# Patient Record
Sex: Female | Born: 1970
Health system: Southern US, Community
[De-identification: ages and names within clinical notes are randomized; demographics above are authoritative.]

## PROBLEM LIST (undated history)

## (undated) DIAGNOSIS — M1990 Unspecified osteoarthritis, unspecified site: Secondary | ICD-10-CM

## (undated) DIAGNOSIS — A6000 Herpesviral infection of urogenital system, unspecified: Secondary | ICD-10-CM

## (undated) DIAGNOSIS — F419 Anxiety disorder, unspecified: Secondary | ICD-10-CM

## (undated) DIAGNOSIS — T7840XA Allergy, unspecified, initial encounter: Secondary | ICD-10-CM

## (undated) DIAGNOSIS — K219 Gastro-esophageal reflux disease without esophagitis: Secondary | ICD-10-CM

## (undated) DIAGNOSIS — R011 Cardiac murmur, unspecified: Secondary | ICD-10-CM

## (undated) DIAGNOSIS — Z9889 Other specified postprocedural states: Secondary | ICD-10-CM

## (undated) DIAGNOSIS — G459 Transient cerebral ischemic attack, unspecified: Secondary | ICD-10-CM

## (undated) DIAGNOSIS — I10 Essential (primary) hypertension: Secondary | ICD-10-CM

## (undated) DIAGNOSIS — H9190 Unspecified hearing loss, unspecified ear: Secondary | ICD-10-CM

## (undated) DIAGNOSIS — Z531 Procedure and treatment not carried out because of patient's decision for reasons of belief and group pressure: Secondary | ICD-10-CM

## (undated) HISTORY — DX: Unspecified osteoarthritis, unspecified site: M19.90

## (undated) HISTORY — PX: COSMETIC SURGERY: SHX468

## (undated) HISTORY — DX: Allergy, unspecified, initial encounter: T78.40XA

## (undated) HISTORY — DX: Gastro-esophageal reflux disease without esophagitis: K21.9

## (undated) HISTORY — DX: Essential (primary) hypertension: I10

## (undated) HISTORY — DX: Anxiety disorder, unspecified: F41.9

## (undated) HISTORY — PX: ROBOTIC ASSISTED LAP VAGINAL HYSTERECTOMY: SHX2362

## (undated) HISTORY — DX: Other specified postprocedural states: Z98.890

---

## 1998-09-13 ENCOUNTER — Ambulatory Visit (HOSPITAL_COMMUNITY): Admission: RE | Admit: 1998-09-13 | Discharge: 1998-09-13 | Payer: Self-pay | Admitting: Ophthalmology

## 1998-11-30 ENCOUNTER — Ambulatory Visit (HOSPITAL_COMMUNITY): Admission: RE | Admit: 1998-11-30 | Discharge: 1998-11-30 | Payer: Self-pay | Admitting: Ophthalmology

## 1999-10-03 ENCOUNTER — Encounter: Payer: Self-pay | Admitting: Emergency Medicine

## 1999-10-03 ENCOUNTER — Emergency Department (HOSPITAL_COMMUNITY): Admission: EM | Admit: 1999-10-03 | Discharge: 1999-10-03 | Payer: Self-pay | Admitting: Emergency Medicine

## 2002-05-03 ENCOUNTER — Other Ambulatory Visit: Admission: RE | Admit: 2002-05-03 | Discharge: 2002-05-03 | Payer: Self-pay | Admitting: Obstetrics and Gynecology

## 2002-09-29 ENCOUNTER — Ambulatory Visit (HOSPITAL_COMMUNITY): Admission: RE | Admit: 2002-09-29 | Discharge: 2002-09-29 | Payer: Self-pay | Admitting: Obstetrics and Gynecology

## 2002-09-29 ENCOUNTER — Encounter: Payer: Self-pay | Admitting: Obstetrics and Gynecology

## 2002-10-12 ENCOUNTER — Ambulatory Visit (HOSPITAL_COMMUNITY): Admission: AD | Admit: 2002-10-12 | Discharge: 2002-10-12 | Payer: Self-pay | Admitting: Obstetrics and Gynecology

## 2002-10-13 ENCOUNTER — Encounter (INDEPENDENT_AMBULATORY_CARE_PROVIDER_SITE_OTHER): Payer: Self-pay | Admitting: *Deleted

## 2002-10-13 ENCOUNTER — Encounter (INDEPENDENT_AMBULATORY_CARE_PROVIDER_SITE_OTHER): Payer: Self-pay

## 2002-10-18 ENCOUNTER — Inpatient Hospital Stay (HOSPITAL_COMMUNITY): Admission: AD | Admit: 2002-10-18 | Discharge: 2002-10-18 | Payer: Self-pay | Admitting: Obstetrics and Gynecology

## 2002-11-24 HISTORY — PX: MYOMECTOMY: SHX85

## 2002-12-21 ENCOUNTER — Inpatient Hospital Stay (HOSPITAL_COMMUNITY): Admission: AD | Admit: 2002-12-21 | Discharge: 2002-12-23 | Payer: Self-pay | Admitting: Obstetrics and Gynecology

## 2002-12-21 ENCOUNTER — Encounter (INDEPENDENT_AMBULATORY_CARE_PROVIDER_SITE_OTHER): Payer: Self-pay

## 2003-04-27 ENCOUNTER — Other Ambulatory Visit: Admission: RE | Admit: 2003-04-27 | Discharge: 2003-04-27 | Payer: Self-pay | Admitting: Obstetrics and Gynecology

## 2003-05-28 ENCOUNTER — Encounter: Payer: Self-pay | Admitting: Emergency Medicine

## 2003-05-28 ENCOUNTER — Emergency Department (HOSPITAL_COMMUNITY): Admission: EM | Admit: 2003-05-28 | Discharge: 2003-05-28 | Payer: Self-pay | Admitting: Emergency Medicine

## 2003-10-16 ENCOUNTER — Encounter: Admission: RE | Admit: 2003-10-16 | Discharge: 2003-10-16 | Payer: Self-pay | Admitting: Family Medicine

## 2003-11-01 ENCOUNTER — Encounter: Admission: RE | Admit: 2003-11-01 | Discharge: 2003-11-01 | Payer: Self-pay | Admitting: Obstetrics and Gynecology

## 2003-11-16 ENCOUNTER — Inpatient Hospital Stay (HOSPITAL_COMMUNITY): Admission: AD | Admit: 2003-11-16 | Discharge: 2003-11-16 | Payer: Self-pay | Admitting: Obstetrics and Gynecology

## 2004-07-31 ENCOUNTER — Encounter: Admission: RE | Admit: 2004-07-31 | Discharge: 2004-07-31 | Payer: Self-pay | Admitting: Obstetrics and Gynecology

## 2004-10-09 ENCOUNTER — Ambulatory Visit (HOSPITAL_COMMUNITY): Admission: RE | Admit: 2004-10-09 | Discharge: 2004-10-09 | Payer: Self-pay | Admitting: *Deleted

## 2004-10-09 ENCOUNTER — Ambulatory Visit (HOSPITAL_BASED_OUTPATIENT_CLINIC_OR_DEPARTMENT_OTHER): Admission: RE | Admit: 2004-10-09 | Discharge: 2004-10-09 | Payer: Self-pay | Admitting: *Deleted

## 2004-10-09 ENCOUNTER — Encounter (INDEPENDENT_AMBULATORY_CARE_PROVIDER_SITE_OTHER): Payer: Self-pay | Admitting: Specialist

## 2005-03-26 ENCOUNTER — Other Ambulatory Visit: Admission: RE | Admit: 2005-03-26 | Discharge: 2005-03-26 | Payer: Self-pay | Admitting: Obstetrics and Gynecology

## 2005-04-07 ENCOUNTER — Other Ambulatory Visit: Admission: RE | Admit: 2005-04-07 | Discharge: 2005-04-07 | Payer: Self-pay | Admitting: Obstetrics and Gynecology

## 2005-07-03 ENCOUNTER — Encounter: Admission: RE | Admit: 2005-07-03 | Discharge: 2005-07-03 | Payer: Self-pay | Admitting: Obstetrics and Gynecology

## 2005-08-29 ENCOUNTER — Other Ambulatory Visit: Admission: RE | Admit: 2005-08-29 | Discharge: 2005-08-29 | Payer: Self-pay | Admitting: Obstetrics and Gynecology

## 2005-11-14 ENCOUNTER — Ambulatory Visit: Payer: Self-pay | Admitting: Family Medicine

## 2006-02-05 ENCOUNTER — Other Ambulatory Visit: Admission: RE | Admit: 2006-02-05 | Discharge: 2006-02-05 | Payer: Self-pay | Admitting: Obstetrics and Gynecology

## 2006-06-24 ENCOUNTER — Ambulatory Visit: Payer: Self-pay | Admitting: Family Medicine

## 2006-06-25 ENCOUNTER — Other Ambulatory Visit: Admission: RE | Admit: 2006-06-25 | Discharge: 2006-06-25 | Payer: Self-pay | Admitting: Obstetrics and Gynecology

## 2006-06-26 ENCOUNTER — Ambulatory Visit: Payer: Self-pay | Admitting: Family Medicine

## 2007-03-24 DIAGNOSIS — B977 Papillomavirus as the cause of diseases classified elsewhere: Secondary | ICD-10-CM

## 2007-03-24 DIAGNOSIS — J309 Allergic rhinitis, unspecified: Secondary | ICD-10-CM | POA: Insufficient documentation

## 2007-03-24 DIAGNOSIS — H9190 Unspecified hearing loss, unspecified ear: Secondary | ICD-10-CM | POA: Insufficient documentation

## 2007-03-24 DIAGNOSIS — J45909 Unspecified asthma, uncomplicated: Secondary | ICD-10-CM

## 2007-03-25 ENCOUNTER — Encounter: Payer: Self-pay | Admitting: Family Medicine

## 2007-03-25 ENCOUNTER — Ambulatory Visit: Payer: Self-pay | Admitting: Family Medicine

## 2007-03-25 DIAGNOSIS — N6009 Solitary cyst of unspecified breast: Secondary | ICD-10-CM

## 2007-08-30 ENCOUNTER — Telehealth (INDEPENDENT_AMBULATORY_CARE_PROVIDER_SITE_OTHER): Payer: Self-pay | Admitting: *Deleted

## 2007-08-31 ENCOUNTER — Ambulatory Visit: Payer: Self-pay | Admitting: Internal Medicine

## 2007-08-31 DIAGNOSIS — J019 Acute sinusitis, unspecified: Secondary | ICD-10-CM

## 2007-10-07 ENCOUNTER — Emergency Department (HOSPITAL_COMMUNITY): Admission: EM | Admit: 2007-10-07 | Discharge: 2007-10-07 | Payer: Self-pay | Admitting: Emergency Medicine

## 2007-10-08 ENCOUNTER — Ambulatory Visit: Payer: Self-pay | Admitting: Internal Medicine

## 2007-10-08 DIAGNOSIS — R51 Headache: Secondary | ICD-10-CM

## 2007-10-08 DIAGNOSIS — R519 Headache, unspecified: Secondary | ICD-10-CM | POA: Insufficient documentation

## 2007-10-08 DIAGNOSIS — R030 Elevated blood-pressure reading, without diagnosis of hypertension: Secondary | ICD-10-CM | POA: Insufficient documentation

## 2007-10-11 ENCOUNTER — Telehealth: Payer: Self-pay | Admitting: Internal Medicine

## 2007-10-12 ENCOUNTER — Encounter: Payer: Self-pay | Admitting: Internal Medicine

## 2007-10-22 ENCOUNTER — Ambulatory Visit: Payer: Self-pay | Admitting: Family Medicine

## 2007-10-22 DIAGNOSIS — I10 Essential (primary) hypertension: Secondary | ICD-10-CM

## 2007-11-05 ENCOUNTER — Ambulatory Visit: Payer: Self-pay | Admitting: Family Medicine

## 2007-12-28 ENCOUNTER — Telehealth (INDEPENDENT_AMBULATORY_CARE_PROVIDER_SITE_OTHER): Payer: Self-pay | Admitting: *Deleted

## 2008-01-05 ENCOUNTER — Ambulatory Visit: Payer: Self-pay | Admitting: Family Medicine

## 2008-01-05 DIAGNOSIS — J45901 Unspecified asthma with (acute) exacerbation: Secondary | ICD-10-CM | POA: Insufficient documentation

## 2008-01-05 DIAGNOSIS — J209 Acute bronchitis, unspecified: Secondary | ICD-10-CM | POA: Insufficient documentation

## 2008-01-06 ENCOUNTER — Ambulatory Visit: Payer: Self-pay | Admitting: Family Medicine

## 2008-01-10 ENCOUNTER — Telehealth (INDEPENDENT_AMBULATORY_CARE_PROVIDER_SITE_OTHER): Payer: Self-pay | Admitting: *Deleted

## 2008-04-06 ENCOUNTER — Ambulatory Visit: Payer: Self-pay | Admitting: Internal Medicine

## 2008-04-06 ENCOUNTER — Other Ambulatory Visit: Admission: RE | Admit: 2008-04-06 | Discharge: 2008-04-06 | Payer: Self-pay | Admitting: Internal Medicine

## 2008-04-06 ENCOUNTER — Encounter: Payer: Self-pay | Admitting: Internal Medicine

## 2008-04-06 DIAGNOSIS — R109 Unspecified abdominal pain: Secondary | ICD-10-CM

## 2008-04-06 DIAGNOSIS — B373 Candidiasis of vulva and vagina: Secondary | ICD-10-CM

## 2008-04-06 DIAGNOSIS — N390 Urinary tract infection, site not specified: Secondary | ICD-10-CM

## 2008-04-06 LAB — CONVERTED CEMR LAB
Bilirubin Urine: NEGATIVE
Glucose, Urine, Semiquant: NEGATIVE
Ketones, urine, test strip: NEGATIVE
Nitrite: NEGATIVE
Protein, U semiquant: NEGATIVE
Specific Gravity, Urine: 1.01
Urobilinogen, UA: NEGATIVE
pH: 6.5

## 2008-04-07 ENCOUNTER — Encounter: Payer: Self-pay | Admitting: Family Medicine

## 2008-04-07 LAB — CONVERTED CEMR LAB: Pap Smear: NORMAL

## 2008-04-10 ENCOUNTER — Encounter (INDEPENDENT_AMBULATORY_CARE_PROVIDER_SITE_OTHER): Payer: Self-pay | Admitting: *Deleted

## 2008-04-18 ENCOUNTER — Telehealth (INDEPENDENT_AMBULATORY_CARE_PROVIDER_SITE_OTHER): Payer: Self-pay | Admitting: *Deleted

## 2008-04-20 ENCOUNTER — Ambulatory Visit: Payer: Self-pay | Admitting: Cardiology

## 2008-04-20 ENCOUNTER — Encounter (INDEPENDENT_AMBULATORY_CARE_PROVIDER_SITE_OTHER): Payer: Self-pay | Admitting: *Deleted

## 2008-04-20 ENCOUNTER — Ambulatory Visit: Payer: Self-pay | Admitting: Internal Medicine

## 2008-04-20 DIAGNOSIS — R109 Unspecified abdominal pain: Secondary | ICD-10-CM | POA: Insufficient documentation

## 2008-04-20 LAB — CONVERTED CEMR LAB
Bacteria, UA: NONE SEEN
RBC / HPF: NONE SEEN (ref <3)

## 2008-04-21 ENCOUNTER — Telehealth (INDEPENDENT_AMBULATORY_CARE_PROVIDER_SITE_OTHER): Payer: Self-pay | Admitting: *Deleted

## 2008-04-21 ENCOUNTER — Encounter (INDEPENDENT_AMBULATORY_CARE_PROVIDER_SITE_OTHER): Payer: Self-pay | Admitting: *Deleted

## 2008-04-24 ENCOUNTER — Telehealth (INDEPENDENT_AMBULATORY_CARE_PROVIDER_SITE_OTHER): Payer: Self-pay | Admitting: *Deleted

## 2008-04-25 ENCOUNTER — Telehealth: Payer: Self-pay | Admitting: Internal Medicine

## 2008-04-25 ENCOUNTER — Encounter: Payer: Self-pay | Admitting: Internal Medicine

## 2008-06-24 ENCOUNTER — Ambulatory Visit: Payer: Self-pay | Admitting: *Deleted

## 2008-06-24 LAB — CONVERTED CEMR LAB
Glucose, Urine, Semiquant: NEGATIVE
Ketones, urine, test strip: NEGATIVE
Nitrite: NEGATIVE
Protein, U semiquant: 30
Specific Gravity, Urine: 1.025
Urobilinogen, UA: 0.2
pH: 6.5

## 2008-10-10 ENCOUNTER — Inpatient Hospital Stay (HOSPITAL_COMMUNITY): Admission: AD | Admit: 2008-10-10 | Discharge: 2008-10-11 | Payer: Self-pay | Admitting: Neurology

## 2008-10-10 ENCOUNTER — Encounter: Payer: Self-pay | Admitting: Emergency Medicine

## 2008-10-10 ENCOUNTER — Telehealth: Payer: Self-pay | Admitting: Family Medicine

## 2008-10-11 ENCOUNTER — Telehealth: Payer: Self-pay | Admitting: Family Medicine

## 2008-10-12 ENCOUNTER — Ambulatory Visit: Payer: Self-pay | Admitting: Family Medicine

## 2008-10-12 LAB — CONVERTED CEMR LAB
BUN: 7 mg/dL (ref 6–23)
CO2: 31 meq/L (ref 19–32)
Calcium: 10 mg/dL (ref 8.4–10.5)
Chloride: 103 meq/L (ref 96–112)
Creatinine, Ser: 0.9 mg/dL (ref 0.4–1.2)
GFR calc Af Amer: 91 mL/min
GFR calc non Af Amer: 75 mL/min
Glucose, Bld: 71 mg/dL (ref 70–99)
Potassium: 4 meq/L (ref 3.5–5.1)
Sodium: 141 meq/L (ref 135–145)

## 2008-11-24 HISTORY — PX: OVARIAN CYST REMOVAL: SHX89

## 2008-11-26 ENCOUNTER — Inpatient Hospital Stay (HOSPITAL_COMMUNITY): Admission: AD | Admit: 2008-11-26 | Discharge: 2008-11-26 | Payer: Self-pay | Admitting: Obstetrics and Gynecology

## 2008-11-30 ENCOUNTER — Encounter: Payer: Self-pay | Admitting: Family Medicine

## 2008-12-04 ENCOUNTER — Telehealth: Payer: Self-pay | Admitting: Family Medicine

## 2008-12-04 DIAGNOSIS — E669 Obesity, unspecified: Secondary | ICD-10-CM | POA: Insufficient documentation

## 2008-12-20 ENCOUNTER — Ambulatory Visit (HOSPITAL_COMMUNITY): Admission: RE | Admit: 2008-12-20 | Discharge: 2008-12-20 | Payer: Self-pay | Admitting: Obstetrics and Gynecology

## 2008-12-20 ENCOUNTER — Encounter (INDEPENDENT_AMBULATORY_CARE_PROVIDER_SITE_OTHER): Payer: Self-pay | Admitting: Obstetrics and Gynecology

## 2009-04-26 ENCOUNTER — Ambulatory Visit: Payer: Self-pay | Admitting: Family Medicine

## 2009-04-26 DIAGNOSIS — J45909 Unspecified asthma, uncomplicated: Secondary | ICD-10-CM | POA: Insufficient documentation

## 2009-04-26 LAB — CONVERTED CEMR LAB
Bilirubin Urine: NEGATIVE
Glucose, Urine, Semiquant: NEGATIVE
Ketones, urine, test strip: NEGATIVE
Nitrite: NEGATIVE
Protein, U semiquant: NEGATIVE
Specific Gravity, Urine: <1.005
Urobilinogen, UA: NEGATIVE
WBC Urine, dipstick: NEGATIVE
pH: 8

## 2009-04-27 ENCOUNTER — Encounter: Payer: Self-pay | Admitting: Family Medicine

## 2009-05-01 ENCOUNTER — Inpatient Hospital Stay (HOSPITAL_COMMUNITY): Admission: AD | Admit: 2009-05-01 | Discharge: 2009-05-01 | Payer: Self-pay | Admitting: Obstetrics and Gynecology

## 2009-05-02 ENCOUNTER — Encounter (INDEPENDENT_AMBULATORY_CARE_PROVIDER_SITE_OTHER): Payer: Self-pay | Admitting: *Deleted

## 2009-05-06 LAB — CONVERTED CEMR LAB
ALT: 16 units/L (ref 0–35)
AST: 22 units/L (ref 0–37)
Albumin: 3.7 g/dL (ref 3.5–5.2)
Alkaline Phosphatase: 63 units/L (ref 39–117)
BUN: 8 mg/dL (ref 6–23)
Basophils Absolute: 0 10*3/uL (ref 0.0–0.1)
Basophils Relative: 0 % (ref 0.0–3.0)
Bilirubin, Direct: 0.1 mg/dL (ref 0.0–0.3)
CO2: 32 meq/L (ref 19–32)
Calcium: 9.1 mg/dL (ref 8.4–10.5)
Chloride: 109 meq/L (ref 96–112)
Cholesterol: 130 mg/dL (ref 0–200)
Creatinine, Ser: 0.8 mg/dL (ref 0.4–1.2)
Eosinophils Absolute: 0.1 10*3/uL (ref 0.0–0.7)
Eosinophils Relative: 1.6 % (ref 0.0–5.0)
GFR calc non Af Amer: 103.5 mL/min (ref >60)
Glucose, Bld: 77 mg/dL (ref 70–99)
HCT: 36.1 % (ref 36.0–46.0)
HDL: 46.2 mg/dL (ref >39.00)
Hemoglobin: 12.6 g/dL (ref 12.0–15.0)
LDL Cholesterol: 73 mg/dL (ref 0–99)
Lymphocytes Relative: 34.3 % (ref 12.0–46.0)
Lymphs Abs: 2.3 10*3/uL (ref 0.7–4.0)
MCHC: 34.8 g/dL (ref 30.0–36.0)
MCV: 91.8 fL (ref 78.0–100.0)
Monocytes Absolute: 0.3 10*3/uL (ref 0.1–1.0)
Monocytes Relative: 3.9 % (ref 3.0–12.0)
Neutro Abs: 4 10*3/uL (ref 1.4–7.7)
Neutrophils Relative %: 60.2 % (ref 43.0–77.0)
Platelets: 308 10*3/uL (ref 150.0–400.0)
Potassium: 3.4 meq/L — ABNORMAL LOW (ref 3.5–5.1)
RBC: 3.94 M/uL (ref 3.87–5.11)
RDW: 12.3 % (ref 11.5–14.6)
Sodium: 142 meq/L (ref 135–145)
TSH: 0.53 microintl units/mL (ref 0.35–5.50)
Total Bilirubin: 0.7 mg/dL (ref 0.3–1.2)
Total CHOL/HDL Ratio: 3
Total Protein: 6.8 g/dL (ref 6.0–8.3)
Triglycerides: 53 mg/dL (ref 0.0–149.0)
VLDL: 10.6 mg/dL (ref 0.0–40.0)
WBC: 6.7 10*3/uL (ref 4.5–10.5)

## 2009-05-07 ENCOUNTER — Encounter (INDEPENDENT_AMBULATORY_CARE_PROVIDER_SITE_OTHER): Payer: Self-pay | Admitting: *Deleted

## 2009-05-21 ENCOUNTER — Encounter: Payer: Self-pay | Admitting: Family Medicine

## 2009-06-08 ENCOUNTER — Encounter (INDEPENDENT_AMBULATORY_CARE_PROVIDER_SITE_OTHER): Payer: Self-pay | Admitting: Obstetrics and Gynecology

## 2009-06-08 ENCOUNTER — Ambulatory Visit (HOSPITAL_COMMUNITY): Admission: RE | Admit: 2009-06-08 | Discharge: 2009-06-08 | Payer: Self-pay | Admitting: Obstetrics and Gynecology

## 2009-06-18 ENCOUNTER — Encounter: Payer: Self-pay | Admitting: Family Medicine

## 2009-06-25 ENCOUNTER — Ambulatory Visit: Payer: Self-pay | Admitting: Family Medicine

## 2009-06-25 DIAGNOSIS — R110 Nausea: Secondary | ICD-10-CM | POA: Insufficient documentation

## 2009-06-25 LAB — CONVERTED CEMR LAB
ALT: 18 U/L
AST: 22 U/L
Albumin: 3.9 g/dL
Alkaline Phosphatase: 56 U/L
Amylase: 143 U/L — ABNORMAL HIGH
BUN: 11 mg/dL
Basophils Absolute: 0.1 10*3/uL
Basophils Relative: 0.7 %
Bilirubin, Direct: 0.1 mg/dL
CO2: 32 meq/L
Calcium: 9.2 mg/dL
Chloride: 104 meq/L
Creatinine, Ser: 0.8 mg/dL
Eosinophils Absolute: 0.2 10*3/uL
Eosinophils Relative: 2.3 %
GFR calc non Af Amer: 103.41 mL/min
Glucose, Bld: 48 mg/dL — CL
HCT: 38.6 %
Hemoglobin: 13 g/dL
Lipase: 32 U/L
Lymphocytes Relative: 42.6 %
Lymphs Abs: 3.6 10*3/uL
MCHC: 33.8 g/dL
MCV: 94.9 fL
Monocytes Absolute: 0.8 10*3/uL
Monocytes Relative: 10 %
Neutro Abs: 3.7 10*3/uL
Neutrophils Relative %: 44.4 %
Platelets: 318 10*3/uL
Potassium: 3.2 meq/L — ABNORMAL LOW
RBC: 4.07 M/uL
RDW: 12.5 %
Sodium: 141 meq/L
TSH: 0.94 u[IU]/mL
Total Bilirubin: 0.9 mg/dL
Total Protein: 7.4 g/dL
WBC: 8.4 10*3/uL

## 2009-06-26 ENCOUNTER — Telehealth (INDEPENDENT_AMBULATORY_CARE_PROVIDER_SITE_OTHER): Payer: Self-pay | Admitting: *Deleted

## 2009-06-26 DIAGNOSIS — R197 Diarrhea, unspecified: Secondary | ICD-10-CM

## 2009-06-27 ENCOUNTER — Ambulatory Visit: Payer: Self-pay | Admitting: Cardiology

## 2009-06-27 ENCOUNTER — Encounter (INDEPENDENT_AMBULATORY_CARE_PROVIDER_SITE_OTHER): Payer: Self-pay | Admitting: *Deleted

## 2009-06-28 ENCOUNTER — Ambulatory Visit: Payer: Self-pay | Admitting: Family Medicine

## 2009-06-30 ENCOUNTER — Encounter: Payer: Self-pay | Admitting: Family Medicine

## 2009-07-01 LAB — CONVERTED CEMR LAB
Amylase: 144 units/L — ABNORMAL HIGH (ref 27–131)
Lipase: 32 units/L (ref 11.0–59.0)

## 2009-07-04 ENCOUNTER — Encounter (INDEPENDENT_AMBULATORY_CARE_PROVIDER_SITE_OTHER): Payer: Self-pay | Admitting: *Deleted

## 2009-07-05 ENCOUNTER — Telehealth (INDEPENDENT_AMBULATORY_CARE_PROVIDER_SITE_OTHER): Payer: Self-pay | Admitting: *Deleted

## 2009-07-17 ENCOUNTER — Ambulatory Visit: Payer: Self-pay | Admitting: Family Medicine

## 2009-07-23 ENCOUNTER — Encounter (INDEPENDENT_AMBULATORY_CARE_PROVIDER_SITE_OTHER): Payer: Self-pay | Admitting: *Deleted

## 2009-07-23 LAB — CONVERTED CEMR LAB: Amylase: 129 units/L (ref 27–131)

## 2009-09-25 ENCOUNTER — Telehealth (INDEPENDENT_AMBULATORY_CARE_PROVIDER_SITE_OTHER): Payer: Self-pay | Admitting: *Deleted

## 2009-09-25 ENCOUNTER — Ambulatory Visit: Payer: Self-pay | Admitting: Family Medicine

## 2009-09-25 DIAGNOSIS — L0390 Cellulitis, unspecified: Secondary | ICD-10-CM

## 2009-09-25 DIAGNOSIS — R209 Unspecified disturbances of skin sensation: Secondary | ICD-10-CM | POA: Insufficient documentation

## 2009-09-25 DIAGNOSIS — L0291 Cutaneous abscess, unspecified: Secondary | ICD-10-CM | POA: Insufficient documentation

## 2009-09-27 ENCOUNTER — Encounter: Payer: Self-pay | Admitting: Family Medicine

## 2009-10-01 ENCOUNTER — Encounter (INDEPENDENT_AMBULATORY_CARE_PROVIDER_SITE_OTHER): Payer: Self-pay | Admitting: *Deleted

## 2009-10-04 ENCOUNTER — Encounter (INDEPENDENT_AMBULATORY_CARE_PROVIDER_SITE_OTHER): Payer: Self-pay | Admitting: *Deleted

## 2009-10-09 LAB — CONVERTED CEMR LAB
Basophils Relative: 1 % (ref 0.0–3.0)
Chloride: 100 meq/L (ref 96–112)
Eosinophils Relative: 6 % — ABNORMAL HIGH (ref 0.0–5.0)
Folate: 16.6 ng/mL
HCT: 38.3 % (ref 36.0–46.0)
Hemoglobin: 12.8 g/dL (ref 12.0–15.0)
MCV: 94.3 fL (ref 78.0–100.0)
Neutrophils Relative %: 14 % — ABNORMAL LOW (ref 43.0–77.0)
Potassium: 3.2 meq/L — ABNORMAL LOW (ref 3.5–5.1)
RBC: 4.06 M/uL (ref 3.87–5.11)
Sodium: 137 meq/L (ref 135–145)
Vit D, 25-Hydroxy: 27 ng/mL — ABNORMAL LOW (ref 30–89)
Vitamin B-12: 736 pg/mL (ref 211–911)
WBC: 6.9 10*3/uL (ref 4.5–10.5)

## 2009-12-03 ENCOUNTER — Telehealth (INDEPENDENT_AMBULATORY_CARE_PROVIDER_SITE_OTHER): Payer: Self-pay | Admitting: *Deleted

## 2009-12-04 ENCOUNTER — Ambulatory Visit: Payer: Self-pay | Admitting: Family Medicine

## 2009-12-10 ENCOUNTER — Encounter: Payer: Self-pay | Admitting: Family Medicine

## 2009-12-11 ENCOUNTER — Telehealth (INDEPENDENT_AMBULATORY_CARE_PROVIDER_SITE_OTHER): Payer: Self-pay | Admitting: *Deleted

## 2009-12-13 ENCOUNTER — Encounter: Payer: Self-pay | Admitting: Family Medicine

## 2010-02-22 ENCOUNTER — Ambulatory Visit: Payer: Self-pay | Admitting: Family Medicine

## 2010-02-22 ENCOUNTER — Other Ambulatory Visit: Admission: RE | Admit: 2010-02-22 | Discharge: 2010-02-22 | Payer: Self-pay | Admitting: Family Medicine

## 2010-02-22 DIAGNOSIS — Z872 Personal history of diseases of the skin and subcutaneous tissue: Secondary | ICD-10-CM | POA: Insufficient documentation

## 2010-02-22 LAB — CONVERTED CEMR LAB
Glucose, Urine, Semiquant: NEGATIVE
Protein, U semiquant: NEGATIVE
WBC Urine, dipstick: NEGATIVE
pH: 7

## 2010-02-25 ENCOUNTER — Telehealth: Payer: Self-pay | Admitting: Family Medicine

## 2010-02-25 ENCOUNTER — Encounter (INDEPENDENT_AMBULATORY_CARE_PROVIDER_SITE_OTHER): Payer: Self-pay | Admitting: *Deleted

## 2010-02-25 LAB — CONVERTED CEMR LAB
ALT: 18 units/L (ref 0–35)
AST: 19 units/L (ref 0–37)
Albumin: 3.8 g/dL (ref 3.5–5.2)
Basophils Relative: 0.4 % (ref 0.0–3.0)
Eosinophils Relative: 2.1 % (ref 0.0–5.0)
GFR calc non Af Amer: 103.04 mL/min (ref >60)
Glucose, Bld: 75 mg/dL (ref 70–99)
HCT: 37.5 % (ref 36.0–46.0)
Hemoglobin: 12.8 g/dL (ref 12.0–15.0)
LDL Cholesterol: 62 mg/dL (ref 0–99)
Lymphs Abs: 3 10*3/uL (ref 0.7–4.0)
Monocytes Relative: 6.8 % (ref 3.0–12.0)
Neutro Abs: 3.1 10*3/uL (ref 1.4–7.7)
Potassium: 3 meq/L — ABNORMAL LOW (ref 3.5–5.1)
Sodium: 141 meq/L (ref 135–145)
TSH: 0.81 microintl units/mL (ref 0.35–5.50)
VLDL: 18 mg/dL (ref 0.0–40.0)
WBC: 6.8 10*3/uL (ref 4.5–10.5)

## 2010-02-27 ENCOUNTER — Encounter (INDEPENDENT_AMBULATORY_CARE_PROVIDER_SITE_OTHER): Payer: Self-pay | Admitting: *Deleted

## 2010-05-07 ENCOUNTER — Ambulatory Visit: Payer: Self-pay | Admitting: Family Medicine

## 2010-05-07 ENCOUNTER — Ambulatory Visit: Payer: Self-pay | Admitting: Cardiovascular Disease

## 2010-05-07 DIAGNOSIS — R079 Chest pain, unspecified: Secondary | ICD-10-CM

## 2010-05-07 DIAGNOSIS — R55 Syncope and collapse: Secondary | ICD-10-CM

## 2010-05-08 ENCOUNTER — Telehealth (INDEPENDENT_AMBULATORY_CARE_PROVIDER_SITE_OTHER): Payer: Self-pay | Admitting: *Deleted

## 2010-05-08 LAB — CONVERTED CEMR LAB
ALT: 15 units/L (ref 0–35)
AST: 20 units/L (ref 0–37)
Alkaline Phosphatase: 59 units/L (ref 39–117)
Basophils Relative: 0.3 % (ref 0.0–3.0)
Bilirubin, Direct: 0.2 mg/dL (ref 0.0–0.3)
CK-MB: 0.8 ng/mL (ref 0.3–4.0)
Chloride: 100 meq/L (ref 96–112)
Creatinine, Ser: 0.8 mg/dL (ref 0.4–1.2)
Eosinophils Relative: 2.2 % (ref 0.0–5.0)
GFR calc non Af Amer: 101.47 mL/min (ref >60)
MCV: 93.3 fL (ref 78.0–100.0)
Monocytes Absolute: 0.6 10*3/uL (ref 0.1–1.0)
Monocytes Relative: 7.5 % (ref 3.0–12.0)
Neutrophils Relative %: 49.3 % (ref 43.0–77.0)
Potassium: 3.2 meq/L — ABNORMAL LOW (ref 3.5–5.1)
RBC: 4.24 M/uL (ref 3.87–5.11)
Relative Index: 0.8 (ref 0.0–2.5)
Total Protein: 7 g/dL (ref 6.0–8.3)
Troponin I: <0.01 ng/mL (ref <0.06)
WBC: 7.6 10*3/uL (ref 4.5–10.5)

## 2010-05-14 ENCOUNTER — Encounter: Payer: Self-pay | Admitting: Family Medicine

## 2010-05-15 ENCOUNTER — Ambulatory Visit: Payer: Self-pay

## 2010-05-15 ENCOUNTER — Encounter: Payer: Self-pay | Admitting: Family Medicine

## 2010-05-16 ENCOUNTER — Telehealth: Payer: Self-pay | Admitting: Family Medicine

## 2010-05-21 ENCOUNTER — Telehealth (INDEPENDENT_AMBULATORY_CARE_PROVIDER_SITE_OTHER): Payer: Self-pay | Admitting: *Deleted

## 2010-06-20 ENCOUNTER — Inpatient Hospital Stay (HOSPITAL_COMMUNITY): Admission: AD | Admit: 2010-06-20 | Discharge: 2010-06-20 | Payer: Self-pay | Admitting: Obstetrics & Gynecology

## 2010-08-25 ENCOUNTER — Emergency Department (HOSPITAL_COMMUNITY): Admission: EM | Admit: 2010-08-25 | Discharge: 2010-08-25 | Payer: Self-pay | Admitting: Emergency Medicine

## 2010-08-25 ENCOUNTER — Encounter: Payer: Self-pay | Admitting: Family Medicine

## 2010-09-12 ENCOUNTER — Telehealth: Payer: Self-pay | Admitting: Family Medicine

## 2010-10-14 ENCOUNTER — Ambulatory Visit: Payer: Self-pay | Admitting: Family Medicine

## 2010-10-14 DIAGNOSIS — E876 Hypokalemia: Secondary | ICD-10-CM

## 2010-10-15 ENCOUNTER — Telehealth (INDEPENDENT_AMBULATORY_CARE_PROVIDER_SITE_OTHER): Payer: Self-pay | Admitting: *Deleted

## 2010-10-15 LAB — CONVERTED CEMR LAB
BUN: 8 mg/dL (ref 6–23)
CO2: 30 meq/L (ref 19–32)
Calcium: 9.6 mg/dL (ref 8.4–10.5)
Creatinine, Ser: 0.9 mg/dL (ref 0.4–1.2)
Glucose, Bld: 77 mg/dL (ref 70–99)

## 2010-10-16 ENCOUNTER — Ambulatory Visit: Payer: Self-pay | Admitting: Cardiology

## 2010-10-16 ENCOUNTER — Encounter: Payer: Self-pay | Admitting: Cardiology

## 2010-10-29 ENCOUNTER — Ambulatory Visit: Payer: Self-pay | Admitting: Cardiology

## 2010-10-29 ENCOUNTER — Encounter: Payer: Self-pay | Admitting: Cardiology

## 2010-10-29 ENCOUNTER — Ambulatory Visit: Payer: Self-pay

## 2010-10-29 ENCOUNTER — Ambulatory Visit (HOSPITAL_COMMUNITY)
Admission: RE | Admit: 2010-10-29 | Discharge: 2010-10-29 | Payer: Self-pay | Source: Home / Self Care | Admitting: Cardiology

## 2010-11-06 ENCOUNTER — Ambulatory Visit: Payer: Self-pay | Admitting: Cardiology

## 2010-11-06 DIAGNOSIS — I2789 Other specified pulmonary heart diseases: Secondary | ICD-10-CM | POA: Insufficient documentation

## 2010-11-10 LAB — CONVERTED CEMR LAB
BUN: 8 mg/dL (ref 6–23)
Basophils Absolute: 0.1 10*3/uL (ref 0.0–0.1)
Calcium: 9.6 mg/dL (ref 8.4–10.5)
Chloride: 102 meq/L (ref 96–112)
Creatinine, Ser: 0.8 mg/dL (ref 0.4–1.2)
Eosinophils Absolute: 0.2 10*3/uL (ref 0.0–0.7)
HCT: 37.5 % (ref 36.0–46.0)
Hemoglobin: 12.6 g/dL (ref 12.0–15.0)
Lymphocytes Relative: 41.7 % (ref 12.0–46.0)
Lymphs Abs: 3.6 10*3/uL (ref 0.7–4.0)
MCHC: 33.6 g/dL (ref 30.0–36.0)
Neutro Abs: 4.1 10*3/uL (ref 1.4–7.7)
Platelets: 347 10*3/uL (ref 150.0–400.0)
Prothrombin Time: 12 s — ABNORMAL HIGH (ref 9.7–11.8)
RDW: 13.7 % (ref 11.5–14.6)

## 2010-11-11 ENCOUNTER — Ambulatory Visit
Admission: RE | Admit: 2010-11-11 | Payer: Self-pay | Source: Home / Self Care | Attending: Cardiology | Admitting: Cardiology

## 2010-11-14 ENCOUNTER — Ambulatory Visit: Payer: Self-pay | Admitting: Cardiology

## 2010-11-14 ENCOUNTER — Telehealth: Payer: Self-pay | Admitting: Cardiology

## 2010-11-14 ENCOUNTER — Encounter: Payer: Self-pay | Admitting: Physician Assistant

## 2010-11-14 DIAGNOSIS — M79609 Pain in unspecified limb: Secondary | ICD-10-CM | POA: Insufficient documentation

## 2010-11-22 ENCOUNTER — Telehealth: Payer: Self-pay | Admitting: Cardiology

## 2010-11-22 ENCOUNTER — Ambulatory Visit: Payer: Self-pay | Admitting: Cardiology

## 2010-11-24 HISTORY — PX: BREAST EXCISIONAL BIOPSY: SUR124

## 2010-12-14 ENCOUNTER — Encounter: Payer: Self-pay | Admitting: Family Medicine

## 2010-12-15 ENCOUNTER — Encounter: Payer: Self-pay | Admitting: Family Medicine

## 2010-12-16 ENCOUNTER — Ambulatory Visit: Admit: 2010-12-16 | Payer: Self-pay | Admitting: Cardiology

## 2010-12-17 ENCOUNTER — Telehealth: Payer: Self-pay | Admitting: Family Medicine

## 2010-12-17 ENCOUNTER — Telehealth (INDEPENDENT_AMBULATORY_CARE_PROVIDER_SITE_OTHER): Payer: Self-pay | Admitting: *Deleted

## 2010-12-23 ENCOUNTER — Telehealth (INDEPENDENT_AMBULATORY_CARE_PROVIDER_SITE_OTHER): Payer: Self-pay | Admitting: *Deleted

## 2010-12-24 NOTE — Progress Notes (Signed)
Summary: HYPERTENSION REPORT TO COMPLETE  Phone Note Call from Patient Call back at Home Phone [phone]   Caller: Patient Summary of Call: PATIENT DROPPED OFF FORM FOR HYPERTENSION REPORT---ENCLOSED ENVELOPE TO MAIL REPORT---SHE SAID IT IS OK TO MAIL, BUT TO MAKE A COPY FOR OUR RECORDS  PLEASE COMPLETE, MAIL AND CALL HER WHEN WE HAVE MAILED THE REPORT  WILL TAKE TO [facility] Initial call taken by: Jerolyn Shin,  December 03, 2009 9:10 AM  Follow-up for Phone Call        on your ledge.  Follow-up by: Army Fossa CMA,  December 03, 2009 2:13 PM

## 2010-12-24 NOTE — Letter (Signed)
Summary: Call a Nurse  Call a Nurse   Imported By: Lanelle Bal 12/14/2009 12:10:47  _____________________________________________________________________  External Attachment:    Type:   Image     Comment:   External Document

## 2010-12-24 NOTE — Assessment & Plan Note (Signed)
Summary: np6/ syncope. pt has atena./gd   Visit Type:  Initial Consult Primary Provider:  Dr. Laury Axon  CC:  syncope, heavy chest, and sob.  History of Present Illness: 40 yo with HTN presents for evaluation of syncope.  In 6/11, patient was standing in line in the cafeteria, felt flushed, and lost consciousness for a few seconds.  She fell to the ground.  No palpitations.  ER workup showed negative PE CT.  On 08/25/10, patient had another syncopal episode.  She had gotten out of bed and was walking down her steps.  She got lightheaded and her chest became tight.  She passed out and fell and was found by her son who called 911.  ER workup was unremarkable except for hypokalemia.  Her K was 2.7.  She takes HCTZ and was since started on KCl.  Since that time, she occasionally gets lightheaded with standing but no other syncopal events.  At baseline, she has good exercise tolerance.  She is able to do aerobics and walk for exercise without dyspnea or chest pain.  She gets occasional non-exertional chest heaviness/tightness.  She cannot describe what brings this on.    ECG: NSR, normal  Labs (6/11): TSH normal Labs (10/11): K 2.7 Labs (10/14/10): K 3.6  Current Medications (verified): 1)  Metoprolol Tartrate 50 Mg  Tabs (Metoprolol Tartrate) .Marland Kitchen.. 1 By Mouth Two Times A Day 2)  Merina Iud 3)  Hydrochlorothiazide 25 Mg Tabs (Hydrochlorothiazide) .Marland Kitchen.. 1 By Mouth Once Daily 4)  Valtrex 500 Mg Tabs (Valacyclovir Hcl) .... Take One Tablet Daily As Needed 5)  Proventil Hfa 108 (90 Base) Mcg/act Aers (Albuterol Sulfate) .... 2 Puffs Qid As Needed 6)  Potassium Chloride Crys Cr 20 Meq Cr-Tabs (Potassium Chloride Crys Cr) .... 2  By Mouth Day 1 Then 1 By Mouth Qhd 7)  Nexium 40 Mg Cpdr (Esomeprazole Magnesium) .Marland Kitchen.. 1 By Mouth Once Daily As Needed  Allergies: No Known Drug Allergies  Past History:  Past Medical History: 1. Allergic rhinitis 2. Asthma 3. Hypertension 4. GERD 5. h/o  myomectomy  Family History: Family History of Asthma - mother Family History Hypertension - father Grandmother with CHF and PCI  Social History: Never Smoked Alcohol use-no Drug use-no Single, works for [employer] one child  Review of Systems       All systems reviewed and negative except as per HPI.   Vital Signs:  Patient profile:   41 year old female Height:      64 inches Weight:      175 pounds Pulse rate:   84 / minute Pulse (ortho):   70 / minute Pulse rhythm:   regular BP sitting:   122 / 72  (right arm) BP standing:   118 / 73  Vitals Entered By: Jacquelin Hawking, CMA (October 16, 2010 12:04 PM)  Serial Vital Signs/Assessments:  Time      Position  BP       Pulse  Resp  Temp     By 12:50pm   Lying RA  113/71   71                    Katina Dung, RN, BSN 12:50pm   Sitting   115/76   74                    Katina Dung, RN, BSN 12:50pm   Standing  118/73   70  Katina Dung, RN, BSN  Comments: 12:50pm right arm 2 min standing 117/77  pulse 73                5 min standing  119/74 pulse  75 By: Katina Dung, RN, BSN    Physical Exam  General:  Well developed, well nourished, in no acute distress. Head:  normocephalic and atraumatic Nose:  no deformity, discharge, inflammation, or lesions Mouth:  Teeth, gums and palate normal. Oral mucosa normal. Neck:  Neck supple, no JVD. No masses, thyromegaly or abnormal cervical nodes. Lungs:  Clear bilaterally to auscultation and percussion. Heart:  Non-displaced PMI, chest non-tender; regular rate and rhythm, S1, S2 without murmurs, rubs or gallops. Carotid upstroke normal, no bruit.  Pedals normal pulses. No edema, no varicosities. Abdomen:  Bowel sounds positive; abdomen soft and non-tender without masses, organomegaly, or hernias noted. No hepatosplenomegaly. Msk:  Back normal, normal gait. Muscle strength and tone normal. Extremities:  No clubbing or cyanosis. Neurologic:  Alert and oriented x  3. Skin:  Intact without lesions or rashes. Psych:  Normal affect.   Impression & Recommendations:  Problem # 1:  SYNCOPE (ICD-780.2) Patient has had 2 episodes of syncope (6/11, 10/11).  She was not orthostatic today. ECG is normal.  It is possible that the episodes were vasovagal as she was standing both times and did have a prodrome.  She denies tachypalpitations.  I will get a 3-week event monitor to evaluate for significant arrhythmias and an echo to make sure her heart is structurally normal.  She will followup in 1 month after testing.  Problem # 2:  CHEST PAIN UNSPECIFIED (ICD-786.50) Patient has had episodes of very atypical chest pain, suspect noncardiac.   Problem # 3:  HYPOKALEMIA (ICD-276.8) Suspect this was from HCTZ.  She is now on KCl.   Other Orders: EKG w/ Interpretation (93000) Echocardiogram (Echo) Event (Event)  Patient Instructions: 1)  Your physician has requested that you have an echocardiogram.  Echocardiography is a painless test that uses sound waves to create images of your heart. It provides your doctor with information about the size and shape of your heart and how well your heart's chambers and valves are working.  This procedure takes approximately one hour. There are no restrictions for this procedure. 2)  Your physician has recommended that you wear an event monitor.  Event monitors are medical devices that record the heart's electrical activity. Doctors most often use these monitors to diagnose arrhythmias. Arrhythmias are problems with the speed or rhythm of the heartbeat. The monitor is a small, portable device. You can wear one while you do your normal daily activities. This is usually used to diagnose what is causing palpitations/syncope (passing out). 3 week  3)  Your physician recommends that you schedule a follow-up appointment in: 1 month with Dr Shirlee Latch.

## 2010-12-24 NOTE — Assessment & Plan Note (Signed)
Summary: CPX/KDC   Vital Signs:  Patient profile:   40 year old female Height:      64 inches Weight:      182.13 pounds BMI:     31.38 Pulse rate:   74 / minute Pulse rhythm:   regular BP sitting:   122 / 80  (left arm) Cuff size:   regular  Vitals Entered By: Army Fossa CMA (February 22, 2010 9:03 AM) CC: CPX, pap   History of Present Illness: Pt here for cpe, pap and labs.  No complaints.    Preventive Screening-Counseling & Management  Alcohol-Tobacco     Alcohol drinks/day: 0     Smoking Status: never     Passive Smoke Exposure: yes  Caffeine-Diet-Exercise     Caffeine use/day: 0     Diet Comments: eating healthy     Does Patient Exercise: yes     Type of exercise: gym      Exercise (avg: min/session): 30-60     Times/week: 4  Hep-HIV-STD-Contraception     Dental Visit-last 6 months yes     Dental Care Counseling: not indicated; dental care within six months  Safety-Violence-Falls     Seat Belt Use: yes     Firearms in the Home: no firearms in the home     Smoke Detectors: yes     Violence in the Home: no risk noted     Sexual Abuse: no      Sexual History:  currently monogamous.        Drug Use:  never.        Blood Transfusions:  no.        Travel History:  [place].    Current Medications (verified): 1)  Metoprolol Tartrate 50 Mg  Tabs (Metoprolol Tartrate) .Marland Kitchen.. 1 By Mouth Two Times A Day 2)  Merina Iud 3)  Hydrochlorothiazide 25 Mg Tabs (Hydrochlorothiazide) .Marland Kitchen.. 1 By Mouth Once Daily 4)  Valtrex 500 Mg Tabs (Valacyclovir Hcl) .... Take One Tablet Daily. 5)  Proventil Hfa 108 (90 Base) Mcg/act Aers (Albuterol Sulfate) .... 2 Puffs Qid As Needed 6)  Klor-Con M20 20 Meq Cr-Tabs (Potassium Chloride Crys Cr) .Marland Kitchen.. 1 By Mouth Once Daily  Allergies (verified): No Known Drug Allergies  Past History:  Past Medical History: Last updated: 10/08/2007 Allergic rhinitis Asthma Hypertension  Family History: Last updated: 06/24/2008 Family  History of Asthma - mother Family History Hypertension - father  Social History: Last updated: 06/24/2008 Never Smoked Alcohol use-no Drug use-no Single one child  Risk Factors: Alcohol Use: 0 (02/22/2010) Caffeine Use: 0 (02/22/2010) Diet: eating healthy (02/22/2010) Exercise: yes (02/22/2010)  Risk Factors: Smoking Status: never (02/22/2010) Passive Smoke Exposure: yes (02/22/2010)  Past Surgical History: Myomectomy in 2006 ovarian cyst and fibroid x2 2010  Family History: Reviewed history from 06/24/2008 and no changes required. Family History of Asthma - mother Family History Hypertension - father  Social History: Reviewed history from 06/24/2008 and no changes required. Never Smoked Alcohol use-no Drug use-no Single one child  Review of Systems      See HPI General:  Denies chills, fatigue, fever, loss of appetite, malaise, sleep disorder, sweats, weakness, and weight loss. Eyes:  Denies blurring, discharge, double vision, eye irritation, eye pain, halos, itching, light sensitivity, red eye, vision loss-1 eye, and vision loss-both eyes; optho q2y. ENT:  Denies decreased hearing, difficulty swallowing, ear discharge, earache, hoarseness, nasal congestion, nosebleeds, postnasal drainage, ringing in ears, sinus pressure, and sore throat. CV:  Denies bluish  discoloration of lips or nails, chest pain or discomfort, difficulty breathing at night, difficulty breathing while lying down, fainting, fatigue, leg cramps with exertion, lightheadness, near fainting, palpitations, shortness of breath with exertion, swelling of feet, swelling of hands, and weight gain. Resp:  Denies chest discomfort, chest pain with inspiration, cough, coughing up blood, excessive snoring, hypersomnolence, morning headaches, pleuritic, shortness of breath, sputum productive, and wheezing. GI:  Denies abdominal pain, bloody stools, change in bowel habits, constipation, dark tarry stools, diarrhea,  excessive appetite, gas, hemorrhoids, indigestion, and loss of appetite. GU:  Denies abnormal vaginal bleeding, decreased libido, discharge, dysuria, hematuria, incontinence, nocturia, urinary frequency, and urinary hesitancy. MS:  Denies joint pain, joint redness, joint swelling, loss of strength, low back pain, mid back pain, muscle aches, muscle , cramps, muscle weakness, stiffness, and thoracic pain. Derm:  Denies changes in color of skin, changes in nail beds, dryness, excessive perspiration, flushing, hair loss, insect bite(s), itching, lesion(s), poor wound healing, and rash. Neuro:  Denies brief paralysis, difficulty with concentration, disturbances in coordination, falling down, headaches, inability to speak, memory loss, numbness, poor balance, seizures, sensation of room spinning, tingling, tremors, visual disturbances, and weakness. Psych:  Denies alternate hallucination ( auditory/visual), anxiety, depression, easily angered, easily tearful, irritability, mental problems, panic attacks, sense of great danger, suicidal thoughts/plans, thoughts of violence, unusual visions or sounds, and thoughts /plans of harming others. Endo:  Denies cold intolerance, excessive hunger, excessive thirst, excessive urination, heat intolerance, polyuria, and weight change. Heme:  Denies abnormal bruising, bleeding, enlarge lymph nodes, fevers, pallor, and skin discoloration. Allergy:  Denies hives or rash, itching eyes, persistent infections, seasonal allergies, and sneezing.  Physical Exam  General:  Well-developed,well-nourished,in no acute distress; alert,appropriate and cooperative throughout examination Head:  Normocephalic and atraumatic without obvious abnormalities. No apparent alopecia or balding. Eyes:  pupils equal, pupils round, pupils reactive to light, and no injection.   Ears:  External ear exam shows no significant lesions or deformities.  Otoscopic examination reveals clear canals, tympanic  membranes are intact bilaterally without bulging, retraction, inflammation or discharge. Hearing is grossly normal bilaterally. Nose:  External nasal examination shows no deformity or inflammation. Nasal mucosa are pink and moist without lesions or exudates. Mouth:  Oral mucosa and oropharynx without lesions or exudates.  Teeth in good repair. Neck:  No deformities, masses, or tenderness noted. Chest Wall:  No deformities, masses, or tenderness noted. Breasts:  No mass, nodules, thickening, tenderness, bulging, retraction, inflamation, nipple discharge or skin changes noted.   Lungs:  Normal respiratory effort, chest expands symmetrically. Lungs are clear to auscultation, no crackles or wheezes. Heart:  Normal rate and regular rhythm. S1 and S2 normal without gallop, murmur, click, rub or other extra sounds. Abdomen:  Bowel sounds positive,abdomen soft and non-tender without masses, organomegaly or hernias noted. Genitalia:  Pelvic Exam:        External: normal female genitalia without lesions or masses        Vagina: normal without lesions or masses        Cervix: normal without lesions or masses        Adnexa: normal bimanual exam without masses or fullness        Uterus: normal by palpation        Pap smear: performed    mirena string visualized Msk:  normal ROM, no joint tenderness, no joint swelling, no joint warmth, no redness over joints, no joint deformities, no joint instability, and no crepitation.   Pulses:  R dorsalis  pedis normal, R carotid normal, L radial normal, L posterior tibial normal, L dorsalis pedis normal, and L carotid normal.   Extremities:  No clubbing, cyanosis, edema, or deformity noted with normal full range of motion of all joints.   Neurologic:  No cranial nerve deficits noted. Station and gait are normal. Plantar reflexes are down-going bilaterally. DTRs are symmetrical throughout. Sensory, motor and coordinative functions appear intact. Skin:  Intact without  suspicious lesions or rashes Cervical Nodes:  No lymphadenopathy noted Axillary Nodes:  No palpable lymphadenopathy Psych:  Cognition and judgment appear intact. Alert and cooperative with normal attention span and concentration. No apparent delusions, illusions, hallucinations   Impression & Recommendations:  Problem # 1:  PREVENTIVE HEALTH CARE (ICD-V70.0) check labs ghm utd  sbe Orders: Venipuncture (40347) TLB-Lipid Panel (80061-LIPID) TLB-BMP (Basic Metabolic Panel-BMET) (80048-METABOL) TLB-CBC Platelet - w/Differential (85025-CBCD) TLB-Hepatic/Liver Function Pnl (80076-HEPATIC) TLB-TSH (Thyroid Stimulating Hormone) (84443-TSH) UA Dipstick w/o Micro (manual) (42595)  Problem # 2:  PERSONAL HISTORY DISEASES SKIN&SUBCUT TISSUE (ICD-V13.3)  Orders: Misc. Referral (Misc. Ref)  Problem # 3:  EXTRINSIC ASTHMA, UNSPECIFIED (ICD-493.00) Assessment: Improved  Her updated medication list for this problem includes:    Proventil Hfa 108 (90 Base) Mcg/act Aers (Albuterol sulfate) .Marland Kitchen... 2 puffs qid as needed  Pulmonary Functions Reviewed: O2 sat: 100 (11/05/2007)  Problem # 4:  HYPERTENSION (ICD-401.9)  Her updated medication list for this problem includes:    Metoprolol Tartrate 50 Mg Tabs (Metoprolol tartrate) .Marland Kitchen... 1 by mouth two times a day    Hydrochlorothiazide 25 Mg Tabs (Hydrochlorothiazide) .Marland Kitchen... 1 by mouth once daily  Orders: Venipuncture (63875) TLB-Lipid Panel (80061-LIPID) TLB-BMP (Basic Metabolic Panel-BMET) (80048-METABOL) TLB-CBC Platelet - w/Differential (85025-CBCD) TLB-Hepatic/Liver Function Pnl (80076-HEPATIC) TLB-TSH (Thyroid Stimulating Hormone) (84443-TSH)  BP today: 122/80 Prior BP: 120/80 (09/25/2009)  Labs Reviewed: K+: 3.2 (09/25/2009) Creat: : 0.9 (09/25/2009)   Chol: 130 (04/26/2009)   HDL: 46.20 (04/26/2009)   LDL: 73 (04/26/2009)   TG: 53.0 (04/26/2009)  Complete Medication List: 1)  Metoprolol Tartrate 50 Mg Tabs (Metoprolol tartrate)  .Marland Kitchen.. 1 by mouth two times a day 2)  Merina Iud  3)  Hydrochlorothiazide 25 Mg Tabs (Hydrochlorothiazide) .Marland Kitchen.. 1 by mouth once daily 4)  Valtrex 500 Mg Tabs (Valacyclovir hcl) .... Take one tablet daily. 5)  Proventil Hfa 108 (90 Base) Mcg/act Aers (Albuterol sulfate) .... 2 puffs qid as needed 6)  Klor-con M20 20 Meq Cr-tabs (Potassium chloride crys cr) .Marland Kitchen.. 1 by mouth once daily   EKG  Procedure date:  02/22/2010  Findings:      Sinus bradycardia with rate of:  57  Flu Vaccine Result Date:  10/03/2009 Flu Vaccine Result:  given Flu Vaccine Next Due:  1 yr    Laboratory Results   Urine Tests    Routine Urinalysis   Color: yellow Appearance: Clear Glucose: negative   (Normal Range: Negative) Bilirubin: negative   (Normal Range: Negative) Ketone: negative   (Normal Range: Negative) Spec. Gravity: 1.015   (Normal Range: 1.003-1.035) Blood: trace-intact   (Normal Range: Negative) pH: 7.0   (Normal Range: 5.0-8.0) Protein: negative   (Normal Range: Negative) Urobilinogen: 0.2   (Normal Range: 0-1) Nitrite: negative   (Normal Range: Negative) Leukocyte Esterace: negative   (Normal Range: Negative)    Comments: Army Fossa CMA  February 22, 2010 9:34 AM

## 2010-12-24 NOTE — Progress Notes (Signed)
Summary: Call-a-nurse triage-FYI  Phone Note Call from Patient   Summary of Call: call a nurse  Called on- 05/07/2010  Reason for call- calling about mild heaviness, feels generalized ill, and sharp stabbing pain severe in left breast. Onset 04-06-10. Afebrile. Eating and drinking well. Activity decreased, laying down. Instructed to call office in am 05/08/10 and make SDA.   I called pt because she did not see [place] the next day she saw you on 05/07/10. She states that all of her symptoms are ressolved and she is feeling fine. Army Fossa CMA  May 16, 2010 9:11 AM

## 2010-12-24 NOTE — Progress Notes (Signed)
Summary: carotid  Phone Note Outgoing Call   Call placed by: [facility] CMA,  May 21, 2010 12:04 PM Details for Reason: mild stenosis---- cholesterol /bp control important----take aspirin a day recheck 1 year Summary of Call: left message to call office............Marland KitchenFelecia Deloach CMA  May 21, 2010 12:05 PM   DISCUSS WITH PATIENT...................Marland KitchenFelecia Deloach CMA  May 22, 2010 1:25 PM

## 2010-12-24 NOTE — Consult Note (Signed)
Summary: [facility]  [facility]   Imported By: Lanelle Bal 05/22/2010 11:12:09  _____________________________________________________________________  External Attachment:    Type:   Image     Comment:   External Document

## 2010-12-24 NOTE — Letter (Signed)
Summary: Primary Care Consult Scheduled Letter  [facility]  [address] [place], [place] [address]   Phone: [phone]  Fax: [phone]      02/25/2010 MRN: [id number]  [name] [address] [place], [place]  [address]    Dear [name],    We have scheduled an appointment for you.  At the recommendation of Dr. Loreen Freud, we have scheduled you a consult with Dr. Louisa Second of [facility] on 03-06-2010 arrive by 9:45am.  Their address is [address] [place] [address]. The office phone number is [phone].  If this appointment day and time is not convenient for you, please feel free to call the office of the doctor you are being referred to at the number listed above and reschedule the appointment.    It is important for you to keep your scheduled appointments. We are here to make sure you are given good patient care.   Thank you,    Renee, Patient Care Coordinator [place] [facility]

## 2010-12-24 NOTE — Letter (Signed)
Summary: Call a Nurse  Call a Nurse   Imported By: Lanelle Bal 05/23/2010 13:53:27  _____________________________________________________________________  External Attachment:    Type:   Image     Comment:   External Document

## 2010-12-24 NOTE — Progress Notes (Signed)
Summary: refill Request  Phone Note Refill Request Message from:  Patient  Refills Requested: Medication #1:  METOPROLOL TARTRATE 50 MG  TABS 1 by mouth two times a day   Supply Requested: 3 months  Medication #2:  HYDROCHLOROTHIAZIDE 25 MG TABS 1 by mouth once daily   Supply Requested: 3 months [facility]  Initial call taken by: Jeremy Johann CMA,  September 12, 2010 11:28 AM  Follow-up for Phone Call        Pt was seen in 6/june for syncpe and told to f/u in 3 mo, ED report shows another recent episode of syncope in october, did you want to refill these  or would you like to see the pt first ?she has no upcoming appts. Please advise Follow-up by: Almeta Monas CMA Duncan Dull),  September 12, 2010 1:42 PM  Additional Follow-up for Phone Call Additional follow up Details #1::        refill #90 each---pt needs ov Additional Follow-up by: Loreen Freud DO,  September 12, 2010 1:44 PM    Prescriptions: METOPROLOL TARTRATE 50 MG  TABS (METOPROLOL TARTRATE) 1 by mouth two times a day  #90 x 0   Entered by:   Almeta Monas CMA (AAMA)   Authorized by:   Loreen Freud DO   Signed by:   Almeta Monas CMA (AAMA) on 09/12/2010   Method used:   Electronically to        [facility] [address] (retail)       [phone]       [place], [place]  [address]       Ph: [phone]       Fax: [phone]   RxID:   [id number] HYDROCHLOROTHIAZIDE 25 MG TABS (HYDROCHLOROTHIAZIDE) 1 by mouth once daily  #90 x 0   Entered by:   Almeta Monas CMA (AAMA)   Authorized by:   Loreen Freud DO   Signed by:   Almeta Monas CMA (AAMA) on 09/12/2010   Method used:   Electronically to        [facility] [address] (retail)       [phone]       [place], [place]  [address]       Ph: [phone]       Fax: [phone]   RxID:   [id number]

## 2010-12-24 NOTE — Miscellaneous (Signed)
Summary: Orders Update  Clinical Lists Changes  Orders: Added new Test order of Carotid Duplex (Carotid Duplex) - Signed 

## 2010-12-24 NOTE — Progress Notes (Signed)
Summary: FYI  Phone Note Outgoing Call Call back at [phone]   Call placed by: Army Fossa CMA,  February 25, 2010 4:36 PM Summary of Call: Regarding lab results, LMTCB:  K very low---increase to two times a day ----recheck 2 weeks Signed by Loreen Freud DO on 02/24/2010 at 8:32 PM  Follow-up for Phone Call        Left message for pt to call back to discuss lab results. Army Fossa CMA  February 26, 2010 12:55 PM   Additional Follow-up for Phone Call Additional follow up Details #1::        Pt states she has not been taking potassium she would rather take 1 a day and start taking agian. Army Fossa CMA  February 26, 2010 4:28 PM     Additional Follow-up for Phone Call Additional follow up Details #2::    patient states she was not on potassium at the time she had the blood test.  Please give patient a call back. Follow-up by: Barb Merino,  February 26, 2010 3:31 PM  Additional Follow-up for Phone Call Additional follow up Details #3:: Details for Additional Follow-up Action Taken: OK TO TAKE 1 A DAY AND RECHECK 2 WEEKS  pt is aware. Army Fossa CMA  February 26, 2010 5:17 PM  Additional Follow-up by: Loreen Freud DO,  February 26, 2010 5:04 PM  New/Updated Medications: KLOR-CON M20 20 MEQ CR-TABS (POTASSIUM CHLORIDE CRYS CR) 1  by mouth once daily Prescriptions: KLOR-CON M20 20 MEQ CR-TABS (POTASSIUM CHLORIDE CRYS CR) 1  by mouth once daily  #30 x 2   Entered by:   Army Fossa CMA   Authorized by:   Loreen Freud DO   Signed by:   Army Fossa CMA on 02/26/2010   Method used:   Electronically to        [facility]. [phone]* (retail)       [address]       [place], [place]  [address]       Ph: [phone]       Fax: [phone]   RxID:   [id number]

## 2010-12-24 NOTE — Letter (Signed)
Summary: Results Follow up Letter  [facility]  [address] [place], [place] [address]   Phone: [phone]  Fax: [phone]    02/27/2010 MRN: [id number]  KAMIYA Akhter [address] [place], [place]  [address]  Dear Ms. Fahey,  The following are the results of your recent test(s):  Test         Result    Pap Smear:        Normal __x___  Not Normal _____ Comments: ______________________________________________________ Cholesterol: LDL(Bad cholesterol):         Your goal is less than:         HDL (Good cholesterol):       Your goal is more than: Comments:  ______________________________________________________ Mammogram:        Normal _____  Not Normal _____ Comments:  ___________________________________________________________________ Hemoccult:        Normal _____  Not normal _______ Comments:    _____________________________________________________________________ Other Tests:    We routinely do not discuss normal results over the telephone.  If you desire a copy of the results, or you have any questions about this information we can discuss them at your next office visit.   Sincerely,   Office of Dr Laury Axon

## 2010-12-24 NOTE — Assessment & Plan Note (Signed)
Summary: FAINTED @WORK , SEVERE HEADACHE--OK PER DANIELLE///SPH   Vital Signs:  Patient profile:   40 year old female Weight:      178.13 pounds O2 Sat:      98 % on Room air Pulse rate:   88 / minute Pulse rhythm:   regular BP sitting:   126 / 84  (left arm) Cuff size:   regular  Vitals Entered By: Army Fossa CMA (May 07, 2010 10:59 AM)  O2 Flow:  Room air CC: Pt here she passed out at work, unsure how long she was out. Has had a HA x 2 days. Feels heaviness on her chest. CBG Result 103   History of Present Illness: Pt here c/o syncope this am.  Pt has had chest pressure but she burped and felt better.  Pt also has headache for 2 days--- ibuprofen helped some yesterday.  Pt has been nauseous also.   No sob.   Pt took no otc PPI etc for stomach----just drank soda to make her burp.    Current Medications (verified): 1)  Metoprolol Tartrate 50 Mg  Tabs (Metoprolol Tartrate) .Marland Kitchen.. 1 By Mouth Two Times A Day 2)  Merina Iud 3)  Hydrochlorothiazide 25 Mg Tabs (Hydrochlorothiazide) .Marland Kitchen.. 1 By Mouth Once Daily 4)  Valtrex 500 Mg Tabs (Valacyclovir Hcl) .... Take One Tablet Daily. 5)  Proventil Hfa 108 (90 Base) Mcg/act Aers (Albuterol Sulfate) .... 2 Puffs Qid As Needed 6)  Klor-Con M20 20 Meq Cr-Tabs (Potassium Chloride Crys Cr) .Marland Kitchen.. 1  By Mouth Once Daily 7)  Nexium 40 Mg Cpdr (Esomeprazole Magnesium) .Marland Kitchen.. 1 By Mouth Once Daily  Allergies (verified): No Known Drug Allergies  Family History: Reviewed history from 06/24/2008 and no changes required. Family History of Asthma - mother Family History Hypertension - father  Social History: Reviewed history from 06/24/2008 and no changes required. Never Smoked Alcohol use-no Drug use-no Single one child  Review of Systems      See HPI  Physical Exam  General:  alert.  lying down on table Head:  Normocephalic and atraumatic without obvious abnormalities. No apparent alopecia or balding. Eyes:  pupils equal, pupils round,  and pupils reactive to light.   Ears:  External ear exam shows no significant lesions or deformities.  Otoscopic examination reveals clear canals, tympanic membranes are intact bilaterally without bulging, retraction, inflammation or discharge. Hearing is grossly normal bilaterally. Neck:  No deformities, masses, or tenderness noted.no carotid bruits.   Lungs:  Normal respiratory effort, chest expands symmetrically. Lungs are clear to auscultation, no crackles or wheezes. Heart:  normal rate and no murmur.   Abdomen:  Bowel sounds positive,abdomen soft and non-tender without masses, organomegaly or hernias noted. Msk:  normal ROM, no joint tenderness, no joint swelling, no joint warmth, no redness over joints, no joint deformities, no joint instability, and no crepitation.   Extremities:  No clubbing, cyanosis, edema, or deformity noted with normal full range of motion of all joints.   Neurologic:  alert & oriented X3, cranial nerves II-XII intact, strength normal in all extremities, and gait normal.   Pt tired and feels weak. Skin:  Intact without suspicious lesions or rashes Psych:  Oriented X3 and normally interactive.     Impression & Recommendations:  Problem # 1:  HEADACHE (ICD-784.0)  Her updated medication list for this problem includes:    Metoprolol Tartrate 50 Mg Tabs (Metoprolol tartrate) .Marland Kitchen... 1 by mouth two times a day  Orders: Radiology Referral (Radiology) EKG w/ Interpretation (  93000) Glucose, (CBG) (82962) Hgb (45409)  Problem # 2:  SYNCOPE (ICD-780.2)  Orders: Venipuncture (81191) TLB-B12 + Folate Pnl (47829_56213-Y86/VHQ) TLB-Cardiac Panel (46962_95284-XLKG) TLB-BMP (Basic Metabolic Panel-BMET) (80048-METABOL) TLB-CBC Platelet - w/Differential (85025-CBCD) TLB-Hepatic/Liver Function Pnl (80076-HEPATIC) TLB-TSH (Thyroid Stimulating Hormone) (40102-VOZ) T-D-Dimer Fibrin Derivatives Quantitive [phone]) T- * Misc. Laboratory test [phone]) Doppler Referral  (Doppler) EKG w/ Interpretation (93000) Glucose, (CBG) (38756) Hgb (43329)  Problem # 3:  CHEST PAIN UNSPECIFIED (ICD-786.50) improves with burping nexium 40 mg daily Orders: Venipuncture (51884) TLB-B12 + Folate Pnl (16606_30160-F09/NAT) TLB-Cardiac Panel (55732_20254-YHCW) TLB-BMP (Basic Metabolic Panel-BMET) (80048-METABOL) TLB-CBC Platelet - w/Differential (85025-CBCD) TLB-Hepatic/Liver Function Pnl (80076-HEPATIC) TLB-TSH (Thyroid Stimulating Hormone) (23762-GBT) T-D-Dimer Fibrin Derivatives Quantitive [phone]) T- * Misc. Laboratory test [phone]) EKG w/ Interpretation (93000) Glucose, (CBG) (82962) Hgb (94854)  Complete Medication List: 1)  Metoprolol Tartrate 50 Mg Tabs (Metoprolol tartrate) .Marland Kitchen.. 1 by mouth two times a day 2)  Merina Iud  3)  Hydrochlorothiazide 25 Mg Tabs (Hydrochlorothiazide) .Marland Kitchen.. 1 by mouth once daily 4)  Valtrex 500 Mg Tabs (Valacyclovir hcl) .... Take one tablet daily. 5)  Proventil Hfa 108 (90 Base) Mcg/act Aers (Albuterol sulfate) .... 2 puffs qid as needed 6)  Klor-con M20 20 Meq Cr-tabs (Potassium chloride crys cr) .Marland Kitchen.. 1  by mouth once daily 7)  Nexium 40 Mg Cpdr (Esomeprazole magnesium) .Marland Kitchen.. 1 by mouth once daily  Other Orders: Capillary Blood Glucose/CBG (62703)  Patient Instructions: 1)  If you have any worsening symptoms ---go to ER   EKG  Procedure date:  05/07/2010  Findings:      Normal sinus rhythm with rate of:  68 bpm   Laboratory Results   Blood Tests     CBG Random:: 103mg /dL   CBC   HGB:  50.0 g/dL   (Normal Range: 93.8-18.2 in Males, 12.0-15.0 in Females) CommentsArmy Fossa CMA  May 07, 2010 11:24 AM

## 2010-12-24 NOTE — Progress Notes (Signed)
Summary: Triage call (lmom 1/18,1/19)  Phone Note Outgoing Call   Summary of Call: Per the Triage call report- I called the pt to follow up and see how the pt was doing today. I left a message for the pt. Army Fossa CMA  December 11, 2009 3:26 PM  Follow-up for Phone Call        left message for pt to call back Army Fossa CMA  December 12, 2009 1:17 PM

## 2010-12-24 NOTE — Progress Notes (Signed)
Summary: Lab Results )  Phone Note Outgoing Call   Call placed by: Army Fossa CMA,  May 08, 2010 9:39 AM Reason for Call: Discuss lab or test results Summary of Call: Regarding lab results, LMTCB:  K low----take kcl 10 meq 1 by mouth once daily ---recheck 3 months Signed by Loreen Freud DO on 05/07/2010 at 5:32 PM    New/Updated Medications: KLOR-CON M10 10 MEQ CR-TABS (POTASSIUM CHLORIDE CRYS CR) 1 by mouth daily. Prescriptions: KLOR-CON M10 10 MEQ CR-TABS (POTASSIUM CHLORIDE CRYS CR) 1 by mouth daily.  #30 x 2   Entered by:   Army Fossa CMA   Authorized by:   Loreen Freud DO   Signed by:   Army Fossa CMA on 05/08/2010   Method used:   Electronically to        [facility]. [phone]* (retail)       [address]       [place], [place]  [address]       Ph: [phone]       Fax: [phone]   RxID:   [id number]

## 2010-12-24 NOTE — Progress Notes (Signed)
Summary: Results   Phone Note Outgoing Call   Details for Reason: weeks   Call placed by: Almeta Monas CMA Duncan Dull),  October 15, 2010 8:33 AM Call placed to: Patient Details for Reason: K goal 4.0----con't KCL replacement-----recheck 2 weeks   Summary of Call: K goal 4.0----con't KCL replacement-----recheck 2 weeks.... Left message to call back    Initial call taken by: Almeta Monas CMA Duncan Dull),  October 15, 2010 8:33 AM  Follow-up for Phone Call        Pt aware will call back later to schedule appt...........Marland KitchenFelecia Deloach CMA  October 16, 2010 3:57 PM

## 2010-12-24 NOTE — Consult Note (Signed)
Summary: [facility]  [facility]   Imported By: Lanelle Bal 12/24/2009 10:41:02  _____________________________________________________________________  External Attachment:    Type:   Image     Comment:   External Document

## 2010-12-24 NOTE — Assessment & Plan Note (Signed)
Summary: f/u syncope//fd   Vital Signs:  Patient profile:   40 year old female Weight:      177.2 pounds Temp:     98.6 degrees F oral BP sitting:   120 / 70  (left arm) Cuff size:   regular  Vitals Entered By: Almeta Monas CMA Duncan Dull) (October 14, 2010 10:37 AM) CC: f/u syncope and med refilled   History of Present Illness: Pt here f/u syncopal episode on 08/25/2010 that lasted 5 minutes.  Pt states she got up and felt dizzy and hungry and went down steps when she passed out and fell down 4 steps.   Her son called 911 and she was taken to ER.  Pt had CT head and xray with back xrays.  Everything was normal per ER.  Pt K was 2.7.  No K was provided by ER.    Pt is feeling better.  No other episode since.  Pt has not been taking KCL rx because she said it was too expensive.   Pt states she had a heaviness in chest just prior to passing out and occassionally gets that feeling.  It usually last 5-10 minutes and then goes away.  This is a feeling she states she has always had.  Pt had CT angio---no PE and carotid dopplers 04/2010.                              Current Medications (verified): 1)  Metoprolol Tartrate 50 Mg  Tabs (Metoprolol Tartrate) .Marland Kitchen.. 1 By Mouth Two Times A Day 2)  Merina Iud 3)  Hydrochlorothiazide 25 Mg Tabs (Hydrochlorothiazide) .Marland Kitchen.. 1 By Mouth Once Daily 4)  Valtrex 500 Mg Tabs (Valacyclovir Hcl) .... Take One Tablet Daily. 5)  Proventil Hfa 108 (90 Base) Mcg/act Aers (Albuterol Sulfate) .... 2 Puffs Qid As Needed 6)  Potassium Chloride Crys Cr 20 Meq Cr-Tabs (Potassium Chloride Crys Cr) .... 2  By Mouth Day 1 Then 1 By Mouth Qhd 7)  Nexium 40 Mg Cpdr (Esomeprazole Magnesium) .Marland Kitchen.. 1 By Mouth Once Daily  Allergies (verified): No Known Drug Allergies  Past History:  Past Medical History: Last updated: 10/08/2007 Allergic rhinitis Asthma Hypertension  Past Surgical History: Last updated: 02/22/2010 Myomectomy in 2006 ovarian cyst and fibroid x2 2010  Family  History: Last updated: 06/24/2008 Family History of Asthma - mother Family History Hypertension - father  Social History: Last updated: 06/24/2008 Never Smoked Alcohol use-no Drug use-no Single one child  Risk Factors: Alcohol Use: 0 (02/22/2010) Caffeine Use: 0 (02/22/2010) Diet: eating healthy (02/22/2010) Exercise: yes (02/22/2010)  Risk Factors: Smoking Status: never (02/22/2010) Passive Smoke Exposure: yes (02/22/2010)  Family History: Reviewed history from 06/24/2008 and no changes required. Family History of Asthma - mother Family History Hypertension - father  Social History: Reviewed history from 06/24/2008 and no changes required. Never Smoked Alcohol use-no Drug use-no Single one child  Review of Systems      See HPI  Physical Exam  General:  Well-developed,well-nourished,in no acute distress; alert,appropriate and cooperative throughout examination Eyes:  pupils equal, pupils round, and pupils reactive to light.   Neck:  No deformities, masses, or tenderness noted. Lungs:  Normal respiratory effort, chest expands symmetrically. Lungs are clear to auscultation, no crackles or wheezes. Heart:  Normal rate and regular rhythm. S1 and S2 normal without gallop, murmur, click, rub or other extra sounds. Neurologic:  alert & oriented X3, cranial nerves II-XII intact, strength normal  in all extremities, and gait normal.   Psych:  Cognition and judgment appear intact. Alert and cooperative with normal attention span and concentration. No apparent delusions, illusions, hallucinations   Impression & Recommendations:  Problem # 1:  HYPOKALEMIA (ICD-276.8)  Orders: Venipuncture (04540) TLB-BMP (Basic Metabolic Panel-BMET) (80048-METABOL) Specimen Handling (98119)  Problem # 2:  SYNCOPE (ICD-780.2) ER records and xrays reviewed pt has had carotid doppler and CT chest  Orders: Neurology Referral (Neuro) Cardiology Referral (Cardiology)  Problem # 3:   CHEST PAIN UNSPECIFIED (ICD-786.50) refer cardio ct chest done in June If CP returns go to ER  Complete Medication List: 1)  Metoprolol Tartrate 50 Mg Tabs (Metoprolol tartrate) .Marland Kitchen.. 1 by mouth two times a day 2)  Merina Iud  3)  Hydrochlorothiazide 25 Mg Tabs (Hydrochlorothiazide) .Marland Kitchen.. 1 by mouth once daily 4)  Valtrex 500 Mg Tabs (Valacyclovir hcl) .... Take one tablet daily. 5)  Proventil Hfa 108 (90 Base) Mcg/act Aers (Albuterol sulfate) .... 2 puffs qid as needed 6)  Potassium Chloride Crys Cr 20 Meq Cr-tabs (Potassium chloride crys cr) .... 2  by mouth day 1 then 1 by mouth qhd 7)  Nexium 40 Mg Cpdr (Esomeprazole magnesium) .Marland Kitchen.. 1 by mouth once daily Prescriptions: POTASSIUM CHLORIDE CRYS CR 20 MEQ CR-TABS (POTASSIUM CHLORIDE CRYS CR) 2  by mouth day 1 then 1 by mouth qhd  #31 x 2   Entered and Authorized by:   Loreen Freud DO   Signed by:   Loreen Freud DO on 10/14/2010   Method used:   Electronically to        [facility]. [phone]* (retail)       [address]       [place], [place]  [address]       Ph: [phone]       Fax: [phone]   RxID:   [id number]    Orders Added: 1)  Neurology Referral [Neuro] 2)  Venipuncture [47425] 3)  TLB-BMP (Basic Metabolic Panel-BMET) [80048-METABOL] 4)  Cardiology Referral [Cardiology] 5)  Specimen Handling [99000] 6)  Est. Patient Level IV [95638]  Appended Document: f/u syncope//fd correction ----  + murmer was heard at todays visit---- very soft

## 2010-12-26 NOTE — Letter (Signed)
Summary: Cardiac Catheterization Instructions- [facility]  [facility], Main Office  [address]   [place], [place] [address]   Phone: [phone]  Fax: [phone]     11/06/2010 MRN: [id number]  [name] [address] [place], [place]  [address]  Dear [name],   You are scheduled for a Cardiac Catheterization on Monday December 19,2011 with Dr. Marca Ancona.  Please arrive to the 1st floor of the [facility] at [facility] at 9:30 am  on the day of your procedure. Please do not arrive before 6:30 a.m. Call the Heart and Vascular Center at [phone] if you are unable to make your appointmnet. The Code to get into the parking garage under the building is 0003. Take the elevators to the 1st floor. You must have someone to drive you home. Someone must be with you for the first 24 hours after you arrive home. Please wear clothes that are easy to get on and off and wear slip-on shoes. Do not eat or drink after midnight except water with your medications that morning. Bring all your medications and current insurance cards with you.    _x__ You may take ALL of your medications with water that morning.   The usual length of stay after your procedure is 2 to 3 hours. This can vary.  If you have any questions, please call the office at the number listed above.   Katina Dung, RN, BSN

## 2010-12-26 NOTE — Procedures (Signed)
Summary: Summary Report  Summary Report   Imported By: Erle Crocker 12/02/2010 16:16:59  _____________________________________________________________________  External Attachment:    Type:   Image     Comment:   External Document

## 2010-12-26 NOTE — Cardiovascular Report (Signed)
Summary: Pre Cath Orders   Pre Cath Orders   Imported By: Roderic Ovens 11/29/2010 16:08:05  _____________________________________________________________________  External Attachment:    Type:   Image     Comment:   External Document

## 2010-12-26 NOTE — Progress Notes (Signed)
Summary: surgeon name   Phone Note Call from Patient Call back at Home Phone [phone]   Caller: Patient Reason for Call: Talk to Nurse Summary of Call: pt calling back with information for dr. Shirlee Latch  surgeon name is dr. Gaynelle Adu office [phone] @ [facility].  Initial call taken by: Lorne Skeens,  November 22, 2010 3:26 PM  Follow-up for Phone Call        note will be faxed to him. Follow-up by: Charolotte Capuchin, RN,  November 22, 2010 3:51 PM

## 2010-12-26 NOTE — Progress Notes (Signed)
Summary: Catheterization   Phone Note Call from Patient Call back at [phone]   Caller: Patient Reason for Call: Talk to Nurse Summary of Call: pt states she had a Catheterization  done Nov 11, 2010. pt states states she feeling numbness in the leg where they did it.  Initial call taken by: Roe Coombs,  November 14, 2010 8:45 AM  Follow-up for Phone Call        I talked with pt--pt had RHC 11/11/10--pt states starting yesterday she has had numbness in her right leg from her thigh to her ankle--she states the cath site is OK with swelling or signs of bleeding or drainage--she states her foot and toes seem to be OK without coldness or discoloration--Pt will come to office today at 11:30 to see Alen Blew verbalized understanding

## 2010-12-26 NOTE — Assessment & Plan Note (Signed)
Summary: rov   Visit Type:  Follow-up Primary Provider:  Dr. Laury Axon  CC:  chest pain.  History of Present Illness: 40 yo with HTN presents for followup of syncope and chest heaviness.  In 6/11, patient was standing in line in the cafeteria, felt flushed, and lost consciousness for a few seconds.  She fell to the ground.  No palpitations.  ER workup showed negative PE CT.  On 08/25/10, patient had another syncopal episode.  She had gotten out of bed and was walking down her steps.  She got lightheaded and her chest became tight.  She passed out and fell and was found by her son who called 911.  ER workup was unremarkable except for hypokalemia.  Her K was 2.7.  She takes HCTZ and was since started on KCl.  Since that time, she occasionally gets lightheaded with standing but no other syncopal events.  At baseline, she has good exercise tolerance.  She is able to do aerobics and walk for exercise without dyspnea or chest pain.  She is short of breath if she walks fast up steps.  She gets occasional non-exertional chest heaviness/tightness.  She cannot describe what brings this on.    She has been wearing an event monitor for about a week with no significant arrhythmias.  Echo was done this month and shows EF 55-60% and a normal appearing RV.  However, PA pressure is mildly elevated to around 42 mmHg.  She has no symptoms suggestive of sleep apnea.  She needs a lumpectomy for a breast papilloma.  This will need to be under general anesthesia.   ECG: NSR, normal  Labs (6/11): TSH normal Labs (10/11): K 2.7 Labs (10/14/10): K 3.6  Current Medications (verified): 1)  Metoprolol Tartrate 50 Mg  Tabs (Metoprolol Tartrate) .Marland Kitchen.. 1 By Mouth Two Times A Day 2)  Merina Iud 3)  Hydrochlorothiazide 25 Mg Tabs (Hydrochlorothiazide) .Marland Kitchen.. 1 By Mouth Once Daily 4)  Valtrex 500 Mg Tabs (Valacyclovir Hcl) .... Take One Tablet Daily As Needed 5)  Proventil Hfa 108 (90 Base) Mcg/act Aers (Albuterol Sulfate) .... 2  Puffs Qid As Needed 6)  Potassium Chloride Crys Cr 20 Meq Cr-Tabs (Potassium Chloride Crys Cr) .... 2  By Mouth Day 1 Then 1 By Mouth Qhd 7)  Nexium 40 Mg Cpdr (Esomeprazole Magnesium) .Marland Kitchen.. 1 By Mouth Once Daily As Needed  Allergies (verified): No Known Drug Allergies  Past History:  Past Medical History: 1. Allergic rhinitis 2. Asthma 3. Hypertension 4. GERD 5. h/o myomectomy 6. Syncope: ? vasovagal 7. Pulmonary hypertension by echo: Echo (12/11) with EF 55-60%, normal diastolic function, normal RV size and systolic function, PA systolic pressure 42 mmHg.   Family History: Reviewed history from 10/16/2010 and no changes required. Family History of Asthma - mother Family History Hypertension - father Grandmother with CHF and PCI  Social History: Reviewed history from 10/16/2010 and no changes required. Never Smoked Alcohol use-no Drug use-no Single, works for [employer] one child  Review of Systems       All systems reviewed and negative except as per HPI.   Vital Signs:  Patient profile:   40 year old female Height:      64 inches Weight:      178 pounds BMI:     30.66 Pulse rate:   84 / minute BP sitting:   124 / 76  (left arm) Cuff size:   regular  Vitals Entered By: Hardin Negus, RMA (November 06, 2010 1:59 PM)  Physical Exam  General:  Well developed, well nourished, in no acute distress. Neck:  Neck supple, JVP 7-8 cm. No masses, thyromegaly or abnormal cervical nodes. Lungs:  Clear bilaterally to auscultation and percussion. Heart:  Non-displaced PMI, chest non-tender; regular rate and rhythm, S1, S2 without murmurs, rubs or gallops. Carotid upstroke normal, no bruit.  Pedals normal pulses. No edema, no varicosities. Abdomen:  Bowel sounds positive; abdomen soft and non-tender without masses, organomegaly, or hernias noted. No hepatosplenomegaly. Extremities:  No clubbing or cyanosis. Neurologic:  Alert and oriented x 3. Psych:  Normal  affect.   Impression & Recommendations:  Problem # 1:  SYNCOPE (ICD-780.2) Patient has had 2 episodes of syncope (6/11, 10/11).  She was not orthostatic at last appointment. ECG is normal.  It is possible that the episodes were vasovagal as she was standing both times and did have a prodrome.  She denies tachypalpitations.  So far, no arrhythmias on event monitor.  Echo unremarkable except for mild pulmonary hypertension.   Problem # 2:  PULMONARY HYPERTENSION (ICD-416.8) I reviewed the echo, and she does indeed appear to have mild pulmonary hypertension by TR doppler.  The RV appears normal.  She does not have sleep apnea symptoms.  Given her history of syncope, atypical chest tightness, and mild shortness of breath  as well as the need for lumpectomy  under general anesthesia, I think that we ought to fully define this with a right heart cath to either confirm or rule out pulmonary hypertension.   Other Orders: TLB-BMP (Basic Metabolic Panel-BMET) (80048-METABOL) TLB-PT (Protime) (85610-PTP) TLB-CBC Platelet - w/Differential (85025-CBCD)  Patient Instructions: 1)  Your physician recommends that you have lab today--BMP/CBC/INR 416.8 2)  Your physician has requested that you have a cardiac catheterization.  Cardiac catheterization is used to diagnose and/or treat various heart conditions. Doctors may recommend this procedure for a number of different reasons. The most common reason is to evaluate chest pain. Chest pain can be a symptom of coronary artery disease (CAD), and cardiac catheterization can show whether plaque is narrowing or blocking your heart's arteries. This procedure is also used to evaluate the valves, as well as measure the blood flow and oxygen levels in different parts of your heart.  For further information please visit [email or web address]  Please follow instruction sheet, as given. 3)  MONDAY DECEMBER [id number] 4)  Your physician recommends that you schedule a follow-up  appointment in 2 weeks with Dr Shirlee Latch. (after you have completed the monitor 11/18/10)

## 2010-12-26 NOTE — Assessment & Plan Note (Signed)
Summary: PER CHECK OUT/ S/P CATH/SAF   Visit Type:  Follow-up Primary Provider:  Dr. Laury Axon   History of Present Illness: 40 yo with HTN presents for followup of syncope and chest heaviness.  In 6/11, patient was standing in line in the cafeteria, felt flushed, and lost consciousness for a few seconds.  She fell to the ground.  No palpitations.  ER workup showed negative PE CT.  On 08/25/10, patient had another syncopal episode.  She had gotten out of bed and was walking down her steps.  She got lightheaded and her chest became tight.  She passed out and fell and was found by her son who called 911.  ER workup was unremarkable except for hypokalemia.  Her K was 2.7.  She takes HCTZ and was since started on KCl.  Since that time, she occasionally gets lightheaded with standing but no other syncopal events.  At baseline, she has good exercise tolerance.  She is able to do aerobics and walk for exercise without dyspnea or chest pain.  She is short of breath if she walks fast up steps.  She gets occasional non-exertional chest heaviness/tightness.  She cannot describe what brings this on.    Echocardiogram was done, showing pulmonary artery systolic pressure 42 mmHg.  Right heart catheterization done to confirm this finding actually showed normal PA pressure.  Three week event monitor showed no significant arrhythmias.    Current Medications (verified): 1)  Metoprolol Tartrate 50 Mg  Tabs (Metoprolol Tartrate) .Marland Kitchen.. 1 By Mouth Two Times A Day 2)  Hydrochlorothiazide 25 Mg Tabs (Hydrochlorothiazide) .Marland Kitchen.. 1 By Mouth Once Daily 3)  Valtrex 500 Mg Tabs (Valacyclovir Hcl) .... Take One Tablet Daily As Needed 4)  Proventil Hfa 108 (90 Base) Mcg/act Aers (Albuterol Sulfate) .... 2 Puffs Qid As Needed 5)  Potassium Chloride Crys Cr 20 Meq Cr-Tabs (Potassium Chloride Crys Cr) .... Take 1 Tablet By Mouth Once A Day  Allergies (verified): No Known Drug Allergies  Past History:  Past Medical History: 1.  Allergic rhinitis 2. Asthma 3. Hypertension 4. GERD 5. h/o myomectomy 6. Syncope: ? vasovagal. See below for echo.  3-week event monitor showed no significant arrhythmias.  7. Pulmonary hypertension by echo: Echo (12/11) with EF 55-60%, normal diastolic function, normal RV size and systolic function, PA systolic pressure 42 mmHg.  This was not confirmed on right heart cath, which showed mean RA pressure 9, PA 31/12 mean 21, mean PCWP 10, CI 2.2.   Family History: Reviewed history from 10/16/2010 and no changes required. Family History of Asthma - mother Family History Hypertension - father Grandmother with CHF and PCI  Social History: Reviewed history from 10/16/2010 and no changes required. Never Smoked Alcohol use-no Drug use-no Single, works for [employer] one child  Vital Signs:  Patient profile:   40 year old female Height:      64 inches Weight:      176 pounds BMI:     30.32 Pulse rate:   76 / minute BP sitting:   110 / 70  (left arm)  Vitals Entered By: Laurance Flatten CMA (November 22, 2010 8:57 AM)  Physical Exam  General:  Well developed, well nourished, in no acute distress. Neck:  Neck supple, no JVD. No masses, thyromegaly or abnormal cervical nodes. Lungs:  Clear bilaterally to auscultation and percussion. Heart:  Non-displaced PMI, chest non-tender; regular rate and rhythm, S1, S2 without murmurs, rubs or gallops. Carotid upstroke normal, no bruit.  Pedals normal pulses. No  edema, no varicosities. Right groin cath site benign.  Abdomen:  Bowel sounds positive; abdomen soft and non-tender without masses, organomegaly, or hernias noted. No hepatosplenomegaly. Extremities:  No clubbing or cyanosis. Neurologic:  Alert and oriented x 3. Psych:  Normal affect.   Impression & Recommendations:  Problem # 1:  SYNCOPE (ICD-780.2) Patient has had 2 episodes of syncope (6/11, 10/11).  She was not orthostatic in our office. ECG is normal. I suspect that the episodes were  vasovagal as she was standing both times and did have a prodrome.  She denies tachypalpitations.  Three week event monitor showed no arrhythmias.  Echo was unremarkable except for pulmonary hypertension which was not confirmed by right heart cath.   Problem # 2:  PULMONARY HYPERTENSION (ICD-416.8) Echo suggested pulmonary HTN but PA pressure was normal on right heart cath.  Suspect false positive echo.    Patient should be safe to proceed to breast surgery under general anesthesia.   Patient Instructions: 1)  Your physician recommends that you schedule a follow-up appointment as needed 2)  Your physician recommends that you continue on your current medications as directed. Please refer to the Current Medication list given to you today.

## 2010-12-26 NOTE — Assessment & Plan Note (Signed)
Summary: rov   Visit Type:  Post-cath Primary Arrian Manson:  Dr. Laury Axon  CC:  Right leg numbness.  History of Present Illness: Primary Cardiologist:  Dr. Marca Ancona  [name] is a 40 yo female who was recently evaluated for syncope.  An echocardiogram was suggestive of possible mild pulmonary hypertension.  A right heart catheterization was arranged on December 19.  Her pressures were normal and showed no evidence of pulmonary hypertension.  She called today with some complaints of numbness in her leg and was added onto my schedule.  She has had some tenderness over her right groin.  She has had some numbness in her right leg in both the anterior and posterior aspect of her upper thigh as well as some of her lower leg anteriorly.  She denies any weakness.  She denies any bowel or bladder incontinence.  She denies any recent back problems.  Current Medications (verified): 1)  Metoprolol Tartrate 50 Mg  Tabs (Metoprolol Tartrate) .Marland Kitchen.. 1 By Mouth Two Times A Day 2)  Hydrochlorothiazide 25 Mg Tabs (Hydrochlorothiazide) .Marland Kitchen.. 1 By Mouth Once Daily 3)  Valtrex 500 Mg Tabs (Valacyclovir Hcl) .... Take One Tablet Daily As Needed 4)  Proventil Hfa 108 (90 Base) Mcg/act Aers (Albuterol Sulfate) .... 2 Puffs Qid As Needed 5)  Potassium Chloride Crys Cr 20 Meq Cr-Tabs (Potassium Chloride Crys Cr) .... Take 1 Tablet By Mouth Once A Day  Allergies (verified): No Known Drug Allergies  Past History:  Past Medical History: Last updated: 11/06/2010 1. Allergic rhinitis 2. Asthma 3. Hypertension 4. GERD 5. h/o myomectomy 6. Syncope: ? vasovagal 7. Pulmonary hypertension by echo: Echo (12/11) with EF 55-60%, normal diastolic function, normal RV size and systolic function, PA systolic pressure 42 mmHg.   Vital Signs:  Patient profile:   40 year old female Height:      64 inches Weight:      177.50 pounds BMI:     30.58 Pulse rate:   62 / minute Pulse rhythm:   regular Resp:     18 per  minute BP sitting:   134 / 80  (right arm) Cuff size:   large  Vitals Entered By: Vikki Ports (November 14, 2010 11:45 AM)  Physical Exam  General:  Well nourished, well developed, in no acute distress HEENT: normal Neck: no JVD Cardiac:  normal S1, S2; RRR; no murmur Lungs:  clear to auscultation bilaterally, no wheezing, rhonchi or rales Abd: soft, nontender, no hepatomegaly Ext: no  edema; R FV site without hematoma or bruit; + tend to palpation MSK:  no lumbar spinal tend to palp; no deformities Skin: warm and dry Neuro:  CNs 2-12 intact, achilles and patellar DTRs 2+ bilat; strength normal and equal bilat.    EKG  Procedure date:  11/14/2010  Findings:      Normal Sinus Rhythm Heart rate 62 Normal axis J-point elevation  Impression & Recommendations:  Problem # 1:  LEG PAIN, RIGHT (ICD-729.5) Nonspecific post right heart cath. Reassurance.  Cath site is stable. Rec. she use nsaids for 4-5 days + ice as needed. Follow up if no better or sooner if worse.  Other Orders: EKG w/ Interpretation (93000)  Patient Instructions: 1)  Take 600 to 800mg  of Motrin every 8 hours times 4 to 5 days

## 2010-12-26 NOTE — Progress Notes (Signed)
Summary: advair refill--out of med--needs asap  Phone Note Refill Request Call back at [phone] Message from:  Patient on December 17, 2010 3:23 PM  Refills Requested: Medication #1:  ADVAIR [facility] [address], [place]-----please call in asap---patient is out and is having asthma like symptoms  Initial call taken by: Jerolyn Shin,  December 17, 2010 3:30 PM  Follow-up for Phone Call        Med not on patient list...please advise Follow-up by: Almeta Monas CMA Duncan Dull),  December 17, 2010 4:09 PM  Additional Follow-up for Phone Call Additional follow up Details #1::        It doesn't looke like we have ever rx med for pt-- she needs to be seen --- if she is having symptoms now she should go to UC. Additional Follow-up by: Loreen Freud DO,  December 17, 2010 4:31 PM    Additional Follow-up for Phone Call Additional follow up Details #2::    stated she was given samples at the office and wanted to know if we can call in the proventil?   Almeta Monas CMA Duncan Dull)  December 17, 2010 4:54 PM   Additional Follow-up for Phone Call Additional follow up Details #3:: Details for Additional Follow-up Action Taken: 1 proventil only---- she can not use more than 2 puffs 4x a day---she needs ov    pt aware of the above, I also advised of symptoms don't get better she needs to go to UC or Hospt...she agreed.... Almeta Monas CMA Duncan Dull)  December 17, 2010 5:13 PM  Additional Follow-up by: Loreen Freud DO,  December 17, 2010 5:03 PM  Prescriptions: PROVENTIL HFA 108 (90 BASE) MCG/ACT AERS (ALBUTEROL SULFATE) 2 puffs qid as needed  #1 x 0   Entered by:   Almeta Monas CMA (AAMA)   Authorized by:   Loreen Freud DO   Signed by:   Almeta Monas CMA (AAMA) on 12/17/2010   Method used:   Faxed to ...       [facility] [address] [phone]* (retail)       [address]       [place], [place]  [address]       Ph: [phone]       Fax: [phone]   RxID:    [id number]

## 2010-12-26 NOTE — Letter (Signed)
Summary: CCS/Cardiac Medical Clearance   CCS/Cardiac Medical Clearance   Imported By: Erle Crocker 11/08/2010 11:59:54  _____________________________________________________________________  External Attachment:    Type:   Image     Comment:   External Document

## 2010-12-26 NOTE — Progress Notes (Signed)
Summary: Hydrochlorothiazide refill--pt is out of meds--need90 day supply  Phone Note Refill Request Message from:  Patient on December 17, 2010 3:31 PM  Refills Requested: Medication #1:  HYDROCHLOROTHIAZIDE 25 MG TABS 1 by mouth once daily [facility], [place]----please call asap ---pt is out   (also requested Metoprolol, but you sent it on 1/19)----needs 90 day supply   Initial call taken by: Jerolyn Shin,  December 17, 2010 3:32 PM    Prescriptions: HYDROCHLOROTHIAZIDE 25 MG TABS (HYDROCHLOROTHIAZIDE) 1 by mouth once daily  #90 x 0   Entered by:   Almeta Monas CMA (AAMA)   Authorized by:   Loreen Freud DO   Signed by:   Almeta Monas CMA (AAMA) on 12/17/2010   Method used:   Faxed to ...       [facility] [address] (retail)       [phone]       [place], [place]  [address]       Ph: [phone]       Fax: [phone]   RxID:   [id number]

## 2011-01-01 NOTE — Progress Notes (Signed)
Summary: Hydrochlorothiazide refill  Phone Note Refill Request Message from:  Patient on December 23, 2010 3:22 PM  Refills Requested: Medication #1:  HYDROCHLOROTHIAZIDE 25 MG TABS 1 by mouth once daily Leavy Cella  [facility] says they still dont have record of this prescription  Initial call taken by: Jerolyn Shin,  December 23, 2010 3:22 PM    Prescriptions: HYDROCHLOROTHIAZIDE 25 MG TABS (HYDROCHLOROTHIAZIDE) 1 by mouth once daily  #90 x 0   Entered by:   Doristine Devoid CMA   Authorized by:   Neena Rhymes MD   Signed by:   Doristine Devoid CMA on 12/23/2010   Method used:   Electronically to        [facility] [address] (retail)       [phone]       [place], [place]  [address]       Ph: [phone]       Fax: [phone]   RxID:   [id number]

## 2011-01-06 HISTORY — PX: BREAST LUMPECTOMY: SHX2

## 2011-01-07 ENCOUNTER — Encounter (HOSPITAL_BASED_OUTPATIENT_CLINIC_OR_DEPARTMENT_OTHER)
Admission: RE | Admit: 2011-01-07 | Discharge: 2011-01-07 | Disposition: A | Discharge status: Home / Self Care | Payer: Managed Care, Other (non HMO) | Source: Ambulatory Visit | Attending: General Surgery | Admitting: General Surgery

## 2011-01-07 DIAGNOSIS — Z01812 Encounter for preprocedural laboratory examination: Secondary | ICD-10-CM | POA: Insufficient documentation

## 2011-01-07 LAB — CBC
Platelets: 325 10*3/uL (ref 150–400)
RDW: 13.1 % (ref 11.5–15.5)
WBC: 8.5 10*3/uL (ref 4.0–10.5)

## 2011-01-07 LAB — DIFFERENTIAL
Basophils Absolute: 0 10*3/uL (ref 0.0–0.1)
Eosinophils Absolute: 0.2 10*3/uL (ref 0.0–0.7)
Eosinophils Relative: 2 % (ref 0–5)
Neutrophils Relative %: 37 % — ABNORMAL LOW (ref 43–77)

## 2011-01-07 LAB — BASIC METABOLIC PANEL
GFR calc Af Amer: >60 mL/min (ref >60)
GFR calc non Af Amer: >60 mL/min (ref >60)
Potassium: 3.3 mEq/L — ABNORMAL LOW (ref 3.5–5.1)
Sodium: 138 mEq/L (ref 135–145)

## 2011-01-08 ENCOUNTER — Ambulatory Visit (HOSPITAL_BASED_OUTPATIENT_CLINIC_OR_DEPARTMENT_OTHER)
Admission: RE | Admit: 2011-01-08 | Discharge: 2011-01-08 | Disposition: A | Discharge status: Home / Self Care | Payer: Managed Care, Other (non HMO) | Source: Ambulatory Visit | Attending: General Surgery | Admitting: General Surgery

## 2011-01-08 ENCOUNTER — Other Ambulatory Visit: Payer: Self-pay | Admitting: General Surgery

## 2011-01-08 DIAGNOSIS — D249 Benign neoplasm of unspecified breast: Secondary | ICD-10-CM | POA: Insufficient documentation

## 2011-01-13 NOTE — Op Note (Signed)
[name], [name] NO.:  [id number]  MEDICAL RECORD NO.:  [id number]           PATIENT TYPE:  LOCATION:                                 FACILITY:  PHYSICIAN:  Mary Sella. Andrey Campanile, MD     DATE OF BIRTH:  March 19, 1971  DATE OF PROCEDURE:  01/08/2011 DATE OF DISCHARGE:                              OPERATIVE REPORT   PREOPERATIVE DIAGNOSIS:  Left breast intraductal papilloma.  POSTOPERATIVE DIAGNOSIS:  Left breast intraductal papilloma.  PROCEDURE:  Needle-localized left breast biopsy.  SURGEON:  Mary Sella. Andrey Campanile, MD  ASSISTANT SURGEON:  None.  ANESTHESIA:  General with LMA plus 20 mL of 0.25% Marcaine with epi.  SPECIMENS:  Left breast biopsy wire marks lateral margin, a single stitch marks superior margin, double stitch marks deep margin.  ESTIMATED BLOOD LOSS:  Minimal.  INDICATIONS FOR PROCEDURE:  Patient is a 40 year old female who had had a previous left major duct excision in 2005 by Dr. Simona Huh for bloody nipple discharge.  She was undergoing her routine self-breast exam which she noticed a lump in her left breast in early October.  She underwent a mammogram and ultrasound, which demonstrated unchanged findings, however, she was found to have a left breast sclerosing intraductal papilloma at the 12 o'clock position on core biopsy.  I recommended excision.  We discussed the risks and benefits of surgery including bleeding, infection, injury to surrounding structures, cosmetic concerns, hematoma formation, seroma formation, need for additional procedures, loss of nipple sensation, as well as dimpling of the skin. She elects to proceed with surgery.  DESCRIPTION OF PROCEDURE:  Prior to showing up at [facility] surgery, she went to Dr. Cherlyn Labella office where a needle-localized guidewire was placed into the area of concern.  She then came to the [facility] for the operative side being the left breast was identified and marked with my initials.  She was  then taken to the operating room, placed supine on the operating room table.  General LMA anesthesia was established.  Her left breast was prepped and draped in the usual standard surgical fashion.  A surgical time-out was performed. Sequential compression devices already been placed.  She received Ancef prior to skin incision.  I reviewed her mammograms from the needle localization.  The guidewire entered her left breast slightly lateral to the nipple-areolar complex at about 2 o'clock and traveled retroareolar. They had speared the mass with the guidewire and the tip of the wire went about one-half cm distal to the mass.  I made a periareolar incision from the 12 o'clock to 3 o'clock position with a #15 blade. The deep dermis was divided with the cut feature electrocautery.  Then using skin hooks, I lifted up on the lateral skin edge and Bovied through the fat and identified the guidewire.  I delivered it into the wound.  I then placed an Allis clamp around the tissue into which the needle wire dove.  This was toward the retroareolar position at the 12 o'clock position.  I then circumferentially cored out a wedge of breast tissue around the guidewire.  I then came  down distal to the guidewire with electrocautery.  Specimens freed in its entirety.  I did not come across the guidewire while Bovieing.  I marked the specimen as described above.  It was then placed in the specimen radiograph and a Faxitron image was obtained.  It appeared that the mass of the lesion of concern was well within the specimen.  Image was sent electronically over to Dr. Cherlyn Labella office, which she confirmed the lesion of concern within the middle of the specimen.  Therefore, I irrigated the wound, obtained hemostasis with electrocautery.  I then injected local in a regional fashion.  The deep dermis was reapproximated with inverted interrupted 3- 0 Vicryl sutures and the skin was reapproximated with a running  4-0 Monocryl in subcuticular fashion.  On the medial aspect of the nipple- areolar complex, about 1-mm of skin had a thermal injury with a peeling of the most superficial layer of the epidermis.  I elected not to resect this area.  It was only 1 mm x 2 mm in length.  The wound was swiped down and dried.  Benzoin was then applied to the wound edges.  I then placed 3 Steri-Strips and two 2x2s and then placed the Tegaderm over the left nipple-areolar complex.  The LMA was removed.  She was transferred to the recovery room in stable addition.  All needle, instrument, sponge counts were correct x2.  There are no immediate complications.  The patient tolerated the procedure well.     Mary Sella. Andrey Campanile, MD     EMW/MEDQ  D:  01/08/2011  T:  01/09/2011  Job:  [id number]  cc:   Dois Davenport A. Rivard, M.D. Jeralyn Ruths, MD  Electronically Signed by Gaynelle Adu M.D. on 01/13/2011 08:42:56 AM

## 2011-01-20 ENCOUNTER — Encounter: Payer: Self-pay | Admitting: Family Medicine

## 2011-01-20 ENCOUNTER — Ambulatory Visit (INDEPENDENT_AMBULATORY_CARE_PROVIDER_SITE_OTHER): Payer: Managed Care, Other (non HMO) | Admitting: Family Medicine

## 2011-01-20 ENCOUNTER — Other Ambulatory Visit: Payer: Self-pay | Admitting: Family Medicine

## 2011-01-20 DIAGNOSIS — E876 Hypokalemia: Secondary | ICD-10-CM

## 2011-01-20 DIAGNOSIS — I10 Essential (primary) hypertension: Secondary | ICD-10-CM

## 2011-01-20 LAB — BASIC METABOLIC PANEL
BUN: 8 mg/dL (ref 6–23)
CO2: 31 mEq/L (ref 19–32)
Chloride: 102 mEq/L (ref 96–112)
Creatinine, Ser: 0.9 mg/dL (ref 0.4–1.2)

## 2011-01-30 NOTE — Assessment & Plan Note (Signed)
Summary: BLOOD PRESSURE FOLLOWUP///SPH   Vital Signs:  Patient profile:   40 year old female Weight:      178.8 pounds Pulse rate:   76 / minute Pulse rhythm:   regular BP sitting:   116 / 70  (left arm) Cuff size:   large  Vitals Entered By: Almeta Monas CMA Duncan Dull) (January 20, 2011 11:39 AM) CC: F/U on BP   History of Present Illness:  Hypertension follow-up      This is a 40 year old woman who presents for Hypertension follow-up.  Pt just had lumpectomy and is feeling very sore and fatigued still.  She will call surgeon today.  The patient complains of fatigue, but denies lightheadedness, urinary frequency, headaches, edema, impotence, and rash.  The patient denies the following associated symptoms: chest pain, chest pressure, exercise intolerance, dyspnea, palpitations, syncope, leg edema, and pedal edema.  Compliance with medications (by patient report) has been near 100%.  The patient reports that dietary compliance has been good.  Adjunctive measures currently used by the patient include salt restriction.    Current Medications (verified): 1)  Metoprolol Tartrate 50 Mg  Tabs (Metoprolol Tartrate) .Marland Kitchen.. 1 By Mouth Two Times A Day 2)  Hydrochlorothiazide 25 Mg Tabs (Hydrochlorothiazide) .Marland Kitchen.. 1 By Mouth Once Daily 3)  Valtrex 500 Mg Tabs (Valacyclovir Hcl) .... Take One Tablet Daily As Needed 4)  Proventil Hfa 108 (90 Base) Mcg/act Aers (Albuterol Sulfate) .... 2 Puffs Qid As Needed 5)  Potassium Chloride Crys Cr 20 Meq Cr-Tabs (Potassium Chloride Crys Cr) .... Take 1 Tablet By Mouth Once A Day  Allergies (verified): No Known Drug Allergies  Past History:  Past Medical History: Last updated: 11/22/2010 1. Allergic rhinitis 2. Asthma 3. Hypertension 4. GERD 5. h/o myomectomy 6. Syncope: ? vasovagal. See below for echo.  3-week event monitor showed no significant arrhythmias.  7. Pulmonary hypertension by echo: Echo (12/11) with EF 55-60%, normal diastolic function,  normal RV size and systolic function, PA systolic pressure 42 mmHg.  This was not confirmed on right heart cath, which showed mean RA pressure 9, PA 31/12 mean 21, mean PCWP 10, CI 2.2.   Family History: Last updated: 10/16/2010 Family History of Asthma - mother Family History Hypertension - father Grandmother with CHF and PCI  Social History: Last updated: 10/16/2010 Never Smoked Alcohol use-no Drug use-no Single, works for [employer]  Risk Factors: Alcohol Use: 0 (02/22/2010) Caffeine Use: 0 (02/22/2010) Diet: eating healthy (02/22/2010) Exercise: yes (02/22/2010)  Risk Factors: Smoking Status: never (02/22/2010) Passive Smoke Exposure: yes (02/22/2010)  Past Surgical History: Myomectomy in 2006 ovarian cyst and fibroid x2 2010 Lumpectomy (01/06/2011)  Family History: Reviewed history from 10/16/2010 and no changes required. Family History of Asthma - mother Family History Hypertension - father Grandmother with CHF and PCI  Social History: Reviewed history from 10/16/2010 and no changes required. Never Smoked Alcohol use-no Drug use-no Single, works for [employer] one [employer]  Review of Systems      See HPI  Physical Exam  General:  Well-developed,well-nourished,in no acute distress; alert,appropriate and cooperative throughout examination Neck:  No deformities, masses, or tenderness noted. Lungs:  Normal respiratory effort, chest expands symmetrically. Lungs are clear to auscultation, no crackles or wheezes. Heart:  normal rate and no murmur.   Extremities:  No clubbing, cyanosis, edema, or deformity noted with normal full range of motion of all joints.   Psych:  Oriented X3 and normally interactive.     Impression & Recommendations:  Problem # 1:  HYPERTENSION (ICD-401.9)  Her updated medication list for this problem includes:    Metoprolol Tartrate 50 Mg Tabs (Metoprolol tartrate) .Marland Kitchen... 1 by mouth two times a day    Hydrochlorothiazide 25 Mg Tabs  (Hydrochlorothiazide) .Marland Kitchen... 1 by mouth once daily  Orders: Venipuncture (46962) TLB-BMP (Basic Metabolic Panel-BMET) (80048-METABOL) Specimen Handling (95284)  BP today: 116/70 Prior BP: 110/70 (11/22/2010)  Labs Reviewed: K+: 3.9 (11/06/2010) Creat: : 0.8 (11/06/2010)   Chol: 124 (02/22/2010)   HDL: 43.70 (02/22/2010)   LDL: 62 (02/22/2010)   TG: 90.0 (02/22/2010)  Problem # 2:  HYPOKALEMIA (ICD-276.8)  Orders: Venipuncture (13244) TLB-BMP (Basic Metabolic Panel-BMET) (80048-METABOL)  Complete Medication List: 1)  Metoprolol Tartrate 50 Mg Tabs (Metoprolol tartrate) .Marland Kitchen.. 1 by mouth two times a day 2)  Hydrochlorothiazide 25 Mg Tabs (Hydrochlorothiazide) .Marland Kitchen.. 1 by mouth once daily 3)  Valtrex 500 Mg Tabs (Valacyclovir hcl) .... Take one tablet daily as needed 4)  Proventil Hfa 108 (90 Base) Mcg/act Aers (Albuterol sulfate) .... 2 puffs qid as needed 5)  Potassium Chloride Crys Cr 20 Meq Cr-tabs (Potassium chloride crys cr) .... Take 1 tablet by mouth once a day  Patient Instructions: 1)  Please schedule a follow-up appointment in 6 months .  Prescriptions: POTASSIUM CHLORIDE CRYS CR 20 MEQ CR-TABS (POTASSIUM CHLORIDE CRYS CR) Take 1 tablet by mouth once a day  #90 x 3   Entered and Authorized by:   Loreen Freud DO   Signed by:   Loreen Freud DO on 01/20/2011   Method used:   Print then Give to Patient   RxID:   [id number] HYDROCHLOROTHIAZIDE 25 MG TABS (HYDROCHLOROTHIAZIDE) 1 by mouth once daily  #90 x 3   Entered and Authorized by:   Loreen Freud DO   Signed by:   Loreen Freud DO on 01/20/2011   Method used:   Print then Give to Patient   RxID:   [id number] METOPROLOL TARTRATE 50 MG  TABS (METOPROLOL TARTRATE) 1 by mouth two times a day  #90 Each x 3   Entered and Authorized by:   Loreen Freud DO   Signed by:   Loreen Freud DO on 01/20/2011   Method used:   Print then Give to Patient   RxID:   [id number]    Orders Added: 1)  Venipuncture [09323] 2)   TLB-BMP (Basic Metabolic Panel-BMET) [80048-METABOL] 3)  Specimen Handling [99000] 4)  Est. Patient Level III [55732]

## 2011-02-03 LAB — POCT I-STAT 3, VENOUS BLOOD GAS (G3P V)
O2 Saturation: 61 %
TCO2: 27 mmol/L (ref 0–100)

## 2011-02-05 LAB — DIFFERENTIAL
Basophils Absolute: 0 10*3/uL (ref 0.0–0.1)
Eosinophils Absolute: 0.1 10*3/uL (ref 0.0–0.7)
Eosinophils Relative: 1 % (ref 0–5)
Lymphocytes Relative: 39 % (ref 12–46)
Lymphs Abs: 2.3 10*3/uL (ref 0.7–4.0)
Monocytes Absolute: 0.3 10*3/uL (ref 0.1–1.0)

## 2011-02-05 LAB — CBC
HCT: 36.7 % (ref 36.0–46.0)
MCH: 30.9 pg (ref 26.0–34.0)
MCV: 87.8 fL (ref 78.0–100.0)
Platelets: 326 10*3/uL (ref 150–400)
RDW: 12.9 % (ref 11.5–15.5)
WBC: 5.9 10*3/uL (ref 4.0–10.5)

## 2011-02-05 LAB — URINALYSIS, ROUTINE W REFLEX MICROSCOPIC
Ketones, ur: NEGATIVE mg/dL
Leukocytes, UA: NEGATIVE
Nitrite: NEGATIVE
Protein, ur: NEGATIVE mg/dL

## 2011-02-05 LAB — POCT I-STAT, CHEM 8
BUN: 4 mg/dL — ABNORMAL LOW (ref 6–23)
Calcium, Ion: 1.15 mmol/L (ref 1.12–1.32)
Creatinine, Ser: 1 mg/dL (ref 0.4–1.2)
Hemoglobin: 13.6 g/dL (ref 12.0–15.0)
Sodium: 142 mEq/L (ref 135–145)
TCO2: 28 mmol/L (ref 0–100)

## 2011-02-08 LAB — COMPREHENSIVE METABOLIC PANEL
AST: 19 U/L (ref 0–37)
Albumin: 3.6 g/dL (ref 3.5–5.2)
BUN: 5 mg/dL — ABNORMAL LOW (ref 6–23)
Calcium: 9.2 mg/dL (ref 8.4–10.5)
Creatinine, Ser: 0.74 mg/dL (ref 0.4–1.2)
GFR calc Af Amer: >60 mL/min (ref >60)
Total Bilirubin: 0.4 mg/dL (ref 0.3–1.2)
Total Protein: 6.9 g/dL (ref 6.0–8.3)

## 2011-02-08 LAB — WET PREP, GENITAL: Trich, Wet Prep: NONE SEEN

## 2011-02-08 LAB — CBC
MCH: 31.4 pg (ref 26.0–34.0)
MCV: 92.8 fL (ref 78.0–100.0)
Platelets: 335 10*3/uL (ref 150–400)
RBC: 4.07 MIL/uL (ref 3.87–5.11)
RDW: 13.4 % (ref 11.5–15.5)

## 2011-02-08 LAB — URINALYSIS, ROUTINE W REFLEX MICROSCOPIC
Nitrite: NEGATIVE
Protein, ur: NEGATIVE mg/dL
Specific Gravity, Urine: 1.02 (ref 1.005–1.030)
Urobilinogen, UA: 1 mg/dL (ref 0.0–1.0)

## 2011-02-08 LAB — URINE CULTURE: Colony Count: 40000

## 2011-02-08 LAB — GC/CHLAMYDIA PROBE AMP, GENITAL: GC Probe Amp, Genital: NEGATIVE

## 2011-02-08 LAB — URINE MICROSCOPIC-ADD ON

## 2011-02-21 ENCOUNTER — Other Ambulatory Visit (INDEPENDENT_AMBULATORY_CARE_PROVIDER_SITE_OTHER): Payer: Managed Care, Other (non HMO)

## 2011-02-21 DIAGNOSIS — R0989 Other specified symptoms and signs involving the circulatory and respiratory systems: Secondary | ICD-10-CM

## 2011-02-21 DIAGNOSIS — E876 Hypokalemia: Secondary | ICD-10-CM

## 2011-02-21 LAB — BASIC METABOLIC PANEL
BUN: 7 mg/dL (ref 6–23)
CO2: 28 mEq/L (ref 19–32)
Calcium: 9.1 mg/dL (ref 8.4–10.5)
Chloride: 102 mEq/L (ref 96–112)
Creat: 0.9 mg/dL (ref 0.40–1.20)

## 2011-02-24 ENCOUNTER — Encounter: Payer: Self-pay | Admitting: *Deleted

## 2011-02-24 NOTE — Progress Notes (Signed)
Letter mailed

## 2011-03-02 LAB — CBC
MCHC: 33.1 g/dL (ref 30.0–36.0)
MCV: 94 fL (ref 78.0–100.0)
Platelets: 294 10*3/uL (ref 150–400)

## 2011-03-02 LAB — BASIC METABOLIC PANEL
BUN: 7 mg/dL (ref 6–23)
CO2: 27 mEq/L (ref 19–32)
Chloride: 101 mEq/L (ref 96–112)
Creatinine, Ser: 0.88 mg/dL (ref 0.4–1.2)

## 2011-03-02 LAB — PREGNANCY, URINE: Preg Test, Ur: NEGATIVE

## 2011-03-03 LAB — URINALYSIS, ROUTINE W REFLEX MICROSCOPIC
Nitrite: NEGATIVE
Specific Gravity, Urine: <1.005 — ABNORMAL LOW (ref 1.005–1.030)
pH: 6.5 (ref 5.0–8.0)

## 2011-03-03 LAB — CBC
Platelets: 318 10*3/uL (ref 150–400)
WBC: 8.6 10*3/uL (ref 4.0–10.5)

## 2011-03-03 LAB — WET PREP, GENITAL
Trich, Wet Prep: NONE SEEN
Yeast Wet Prep HPF POC: NONE SEEN

## 2011-03-03 LAB — GC/CHLAMYDIA PROBE AMP, GENITAL: Chlamydia, DNA Probe: NEGATIVE

## 2011-03-10 LAB — BASIC METABOLIC PANEL
CO2: 30 mEq/L (ref 19–32)
Chloride: 103 mEq/L (ref 96–112)
Creatinine, Ser: 0.89 mg/dL (ref 0.4–1.2)
GFR calc Af Amer: >60 mL/min (ref >60)

## 2011-03-10 LAB — CBC
Hemoglobin: 13 g/dL (ref 12.0–15.0)
MCHC: 33.3 g/dL (ref 30.0–36.0)
RBC: 4.16 MIL/uL (ref 3.87–5.11)

## 2011-04-08 NOTE — Op Note (Signed)
NAMEEVANGELENE, [name]                ACCOUNT NO.:  [id number]   MEDICAL RECORD NO.:  [id number]          PATIENT TYPE:  AMB   LOCATION:  SDC                           FACILITY:  [facility]   PHYSICIAN:  Dois Davenport A. Rivard, M.D. DATE OF BIRTH:  12-23-1970   DATE OF PROCEDURE:  12/20/2008  DATE OF DISCHARGE:                               OPERATIVE REPORT   PREOPERATIVE DIAGNOSES:  Pelvic pain with right ovarian complex cyst and  uterine fibroids.   POSTOPERATIVE DIAGNOSES:  Right ovarian simple cysts x3 and a left round  ligament fibroid.   ANESTHESIA:  General.   PROCEDURE:  Laparoscopy with right ovarian cystectomy x3 and pelvic  washings.   SURGEON:  Crist Fat. Rivard, MD   ASSISTANT:  Elmira J. Lowell Guitar, PA   ESTIMATED BLOOD LOSS:  Minimal.   PROCEDURE:  After being informed of the planned procedure with possible  complications including bleeding, infection, injury to other organs and  need for laparotomy as well as the need for subsequent surgery, informed  consent was obtained.  The patient was taken to OR #3, given general  anesthesia with endotracheal intubation without any complication.  She  was placed in the lithotomy position, prepped and draped in a sterile  fashion and a Foley catheter was inserted in her bladder.  A low  speculum was inserted anterior lip of the cervix was grasped with a  tenaculum forceps and a Hulka intrauterine manipulator was placed.  The  tenaculum was removed and the speculum was removed.   We infiltrate the umbilical area with 5 mL of Marcaine 0.25 and  performed a semi elliptical incision which was brought down sharply to  the fascia.  The fascia was identified, grasped with Kocher forceps,  incised with Mayo scissors.  Peritoneum was entered bluntly.  A  pursestring suture of 0 Vicryl was placed on the fascia to hold a 10-mm  Hasson trocar which was easily inserted.  We were now able to insufflate  a pneumoperitoneum with CO2 at a maximum pressure  of 15 mmHg.  A 10-mm  suprapubic pubic trocar was inserted under direct visualization after  infiltrating with Marcaine 0.25 and the right lower quadrant 5-mm trocar  was inserted under direct visualization after infiltration with Marcaine  0.25.   OBSERVATION:  Anterior cul-de-sac was normal.  Uterus was normal.  Posterior cul-de-sac has a pseudo window with no other apparent lesions  for endometriosis.  Left tube was normal with a left ovary that  completely normal.  There were some fine adhesions between two, but the  fimbriae was not involved.  Right tube was normal.  Right ovary was  mobile, has a elongated utero-ovarian ligament with cone-like vascular  pattern and what appears to be too simple cyst with a thin wall fluid  filled.  We note a left round ligament fibroid measuring 5 cm x 4 cm.  That fibroid was mobile and could be addressed laparoscopically.  Should  we had a morcellator to remove it from the abdominal cavity.  We  visualized the appendix which was normal.  Liver was normal.  No other  pathology was seen.  We proceed with pelvic washings.  We then proceed  with infiltration of the ovarian capsule with 10 mL of vasopressin 20  units in 50 mL.  We then opened the capsule with hot scissors and we  performed a cystectomy after evacuation of clear fluid using graspers  with traction and counter traction.  The cyst wall was sent for frozen  section.  A second cyst measuring, the first one measured approximately  3 cm.  The second one measured approximately 3.5 cm and was addressed in  the exact same fashion, opening the capsule with scissors, grasping the  cyst wall and removing it by a blunt dissection.  Finally, a 1.5-cm cyst  on the lower pole of the ovary was addressed in the exact same fashion.  We now used warm saline to irrigate profusely.  We complete hemostasis  systematically with bipolar cauterization.  Hemostasis was now deemed  adequate.  We irrigated the  pelvis with warm saline.  We documented  adequate hemostasis on the remaining over ovary with an underwater view.   Frozen section was called back as benign ovarian cyst, possibly with a  corpus luteal component.   Due to the nonavailability of a morcellator, we decided not to proceed  with myomectomy, which would require to remove the specimen either a  laparotomy or a posterior colpotomy, which can increase the morbidity of  the procedure.   Instruments were then removed after evacuating the pneumoperitoneum.  Instruments and sponge count was complete x2.  The fascia of the  umbilical incision was closed with a previously placed pursestring  suture of 0 Vicryl.  The fascia of the suprapubic incision was closed  with a figure-of-eight stitch of 0 Vicryl.  The skin was closed with  subcuticular suture of 3-0 Monocryl and Dermabond.   Instrument and sponge count was complete x2.  Estimated blood loss was  minimal.  The procedure was very well tolerated by the patient, who was  taken to recovery room in a well and stable condition.   SPECIMEN:  Wall cyst for 3 ovarian cysts sent to Pathology.      Crist Fat Rivard, M.D.  Electronically Signed     SAR/MEDQ  D:  12/20/2008  T:  12/21/2008  Job:  [id number]

## 2011-04-08 NOTE — H&P (Signed)
[name], [name] NO.:  [id number]   MEDICAL RECORD NO.:  [id number]           PATIENT TYPE:   LOCATION:                                 FACILITY:   PHYSICIAN:  Crist Fat. Rivard, M.D. DATE OF BIRTH:  11/14/1971   DATE OF ADMISSION:  DATE OF DISCHARGE:                              HISTORY & PHYSICAL   HISTORY OF PRESENT ILLNESS:  [name] is a 39 year old single African  American female para 1-0-1-1 presenting for robotic myomectomy because  of pelvic pain and a left round ligament fibroid.   In January of 2010 the patient underwent laparoscopic removal of three  right ovarian cysts.  During that procedure the patient was observed to  have a 5 cm x 4 cm fibroid within the left round ligament.  The fibroid  appeared mobile and had a morcellator have been available a myomectomy  would have been performed at that time, however, since it was not then a  myomectomy was not pursued.   Patient was asymptomatic until June of 2010 when she reported to [facility] maternity admissions unit complaining of a sudden  severe sharp stabbing pain rated at a 10/10 on a 10-point scale pain  scale which radiated to her mid pelvic area and back.  The patient  underwent a pelvic ultrasound which showed a uterus which was upper  limits of normal size but heterogeneous.  The uterine dimensions were  8.3 x 4.5 x 5.8 cm with the IUD in the expected location within the  endometrium at the fundus.  The patient's endometrial stripe was not  thickened measuring 6 mm in width.  There were numerous myometrial  fibroids identified, the largest of which was pedunculated at the fundus  to the left of midline measuring 4.9 cm.  The remainder of the patient's  fibroids were myometrial and all of the fibroids appeared stable in  size.  The patient's both ovaries had normal size and appearance with  the right ovary measuring 2.6 x 1.9 x 1.9 cm, left ovary measuring 3.4 x  1.9 x 1.6 cm.  The patient received some relief from her pain at that  visit with the administration of Toradol however her most relief came  with Vicodin which would decrease her 10/10 pain to a 3/10.  In spite of  this the patient's pain persisted intermittently lasting 5 minutes per  episode.  Since that time the patient has continued to have to use  Vicodin for relief and noticed that her discomfort increases whenever  she strains or bends and is relieved not only with Vicodin but also with  assuming a fetal position.  The patient states that on occasion she will  be awakened from her sleep because of her pain.  She denies any nausea,  vomiting, diarrhea, urinary tract symptoms or vaginitis symptoms.  As a  part of her hospital visit patient has gonorrhea, chlamydia cultures and  urine pregnancy test were negative and her CBC at that time was within  normal limits.   Given the patient's persistent pelvic pain and  the findings of a left  round ligament fibroid she wishes to proceed with robotic management of  the fibroid in the form of myomectomy.   PAST MEDICAL HISTORY:   OB HISTORY:  Gravida 2, para 1-0-1-1, the patient had one spontaneous  vaginal delivery.   GYN HISTORY:  Menarche 40 years old.  The patient's last menstrual  period is unknown due to the fact that she uses Mirena IUD as a method  of contraception and has been amenorrheic.  She does have a history of  HSV, herpes simplex virus #2 and a history of CIN-I.  The patient's last  Pap smear was August of 2009 at which time she had ASCUS findings with  an indeterminate HPV (the patient has a repeat Pap smear and HPV typing  which is currently pending).   MEDICAL HISTORY:  1. Renal stones.  2. Congenital left ear deafness.  3. Migraine headaches.  4. Hypertension.  5. Asthma.   SURGICAL HISTORY:  1. In 2003 diagnostic laparoscopy for pelvic pain with an ovarian      cystectomy and findings that were consistent  with endometriosis,      though no pathologic evidence was obtained.  2. In 2004 myomectomy.  3. In 2005 left breast biopsy which was benign.  4. In 2010 laparoscopic right ovarian cystectomy.   The patient denies any problems with anesthesia.  Denies any history of  blood transfusions.  *The patient is Jehovah's Witness and does not  receive blood products.   FAMILY HISTORY:  Cardiovascular disease, hypertension, thyroid disease,  lung cancer, breast cancer (maternal grandmother menopausal) and  substance abuse.   SOCIAL HISTORY:  The patient is single and she works Community education officer, [employer].  The patient is of the Jehovah's Witness faith.   HABITS:  She does not use alcohol, tobacco or illicit drugs.  .   CURRENT MEDICATIONS:  1. Valtrex 500 mg daily.  2. Metoprolol 50 mg twice daily.  3. Advair as needed.  4. Multivitamin 1 tablet daily.  5. Vicodin 1 - 2 tablets every 4 - 6 hours as needed for pain.   THE PATIENT HAS NO KNOWN DRUG ALLERGIES AND DENIES ANY SENSITIVITY TO  LATEX OR SHELLFISH.   REVIEW OF SYSTEMS:  The patient does wear glasses.  Denies any chest  pain, shortness of breath, nausea, vomiting, diarrhea, fever, vaginitis  symptoms, respiratory symptoms and except as is mentioned in history  present illness the patient's review of systems is otherwise negative.   PHYSICAL EXAM:  Blood pressure 140/80, pulse is 82, respirations 14,  height 5 feet 3 inches tall, weight 178, body mass index 31.5.  NECK:  Supple without masses.  There is no thyromegaly or cervical  adenopathy.  HEART:  Regular rate and rhythm.  LUNGS:  Clear.  BACK:  No CVA tenderness.  ABDOMEN:  No tenderness, masses or organomegaly.  EXTREMITIES:  No clubbing, cyanosis or edema.  PELVIC:  EG BUS is within normal limits.  Vagina is rugous.  Cervix is  nontender without lesions.  Uterus appears normal size, shape and  consistency with tenderness.  The patient has mild tenderness in her   right adnexa, exquisite tenderness in her left adnexa with a palpable  firm mass.   IMPRESSION:  1. Pelvic pain.  2. Symptomatic left round ligament fibroid.   DISPOSITION:  A discussion was held with the patient regarding the  indications for her procedure along with its risks which include but are  not limited to reaction to anesthesia, damage to adjacent organs,  infection, excessive bleeding and transient postoperative facial edema.  The patient also understands that a robotic procedure requires more time  than the traditional open abdominal procedure, that her expected  hospital stay is 1 - 2 days and that she should be able to return to her  usual activities within 1 - 2 weeks.  The patient verbalized  understanding of these risk along with expectations and has consented to  proceed with a robotic myomectomy at [facility] on  June 08, 2009 at 7:30 a.m.      Elmira J. Adline Peals.      Crist Fat Rivard, M.D.  Electronically Signed    EJP/MEDQ  D:  06/01/2009  T:  06/02/2009  Job:  [id number]

## 2011-04-08 NOTE — Discharge Summary (Signed)
[name], [name]                ACCOUNT NO.:  [id number]   MEDICAL RECORD NO.:  [id number]          PATIENT TYPE:  INP   LOCATION:  3021                         FACILITY:  [facility]   PHYSICIAN:  Pramod P. Pearlean Brownie, MD    DATE OF BIRTH:  Apr 11, 1971   DATE OF ADMISSION:  10/10/2008  DATE OF DISCHARGE:  10/11/2008                               DISCHARGE SUMMARY   DIAGNOSES AT THE TIME OF DISCHARGE:  1. Migraine, no acute hemorrhage.  2. Hypertension.  3. Endometriosis.  4. Uterine fibroids.  5. Left ovarian cyst.  6. Herpes simplex virus.  7. Left __________, status post step excision.  8. Status post left ovarian cystectomy and cauterization of      endometriosis in 2003.  9. Status post myomectomy in January 2004.   MEDICINES AT THE TIME OF DISCHARGE:  1. Metoprolol 50 mg b.i.d.  2. Hydrochlorothiazide 25 mg a day.   STUDIES PERFORMED:  1. CT of the brain on admission shows focal density in the globus      pallidus of the left basal ganglia suspicious for acute hemorrhagic      lacunar infarct, however, maybe calcification.  2. MRI of the brain shows no acute infarct, small area of altered      signal within the left globus pallidus, may be a small hemorrhage,      most likely calcification.  3. MRA of the head shows limited slightly motion degraded exam,      intracranial atherosclerotic type changes may be present, small      aneurysm of supraclinoid aspect of the left internal carotid artery      cannot be excluded.  4. MRA of the neck shows minimal and irregularity narrowing proximal      right internal carotid artery with no hemodynamically significant      stenosis involving either bifurcation of left carotid artery      dominant, right vertebral artery, and posterior inferior cerebellar      artery.  5. Carotid Doppler not performed, cranial Doppler not performed, 2-D      echocardiogram not performed.  EKG not present in chart, though      telemetry shows sinus  rhythm.   LABORATORY STUDIES:  Cholesterol 114, triglycerides 46, HDL 35, and LDL  70.  Hemoglobin A1c 4.9.  Urine drug screen positive for opiates,  otherwise negative.  Urine pregnancy test negative.  Cardiac enzymes  negative.  Chemistry with glucose 129, BUN 4, otherwise normal.  Initial  chemistry normal.  Coags normal.  CBC normal.   HISTORY OF PRESENT ILLNESS:  Ms. [name] is a 40 year old African  American female with a history of endometriosis and hypertension, who  presented to the [facility] with complaints of  headache and left-sided numbness.  The patient presented to work around  7 a.m. as usual on the day of admission.  She was working on her  computer and she developed rapidly progressive onset of frontal headache  10/10 without radiation.  She describes the pain is a pressure sensation  with pulsatility, and was accompanied  by the left-sided numbness without  frank weakness.  She denied any frank visual changes, but states she has  difficulties in her vision and no dysarthria, dysphagia, or weakness,  but she did feel dizzy when she rose from the chair.  She was seen at  [facility] in [facility] where a CT scan of the head, question  small left corona radiali hemorrhage.  Neurology was consulted, and  decision was made to admit for further evaluation.  She was not a t-PA  candidate secondary to possible hemorrhage.   HOSPITAL COURSE:  MRI was negative for acute infarct as well as negative  for acute hemorrhage but seeing the globus pallidus is likely leading to  that only if calcification.  Her symptoms found consistent with migraine  and not TIA like.  She was on metoprolol prior to admission for slightly  elevated blood pressure, and hydrochlorothiazide was added in the  hospital.  Her blood pressure on admission was 151/91.  She has been  asked to follow up with general neurology as needed for headache.   CONDITION ON DISCHARGE:  The  patient alert and oriented x3.  No aphasia.  No dysarthria.  Eye movements are full.  No facial weakness.  No drift.  Deep tendon reflexes were 2+.  Plantars are down bilaterally.   DISCHARGE/PLAN:  1. Discharge home with family.  2. Follow up with primary care physician within 1 month for blood      pressure control.  3. Follow up with Dr. Terrace Arabia, neurologist as needed for headache,      migraine control.  4. No stroke followup needed.      Annie Main, N.P.    ______________________________  Sunny Schlein. Pearlean Brownie, MD    SB/MEDQ  D:  10/11/2008  T:  10/11/2008  Job:  [id number]   cc:   Lelon Perla, DO

## 2011-04-08 NOTE — H&P (Signed)
NAMEPAULITA, LICKLIDER                ACCOUNT NO.:  [id number]   MEDICAL RECORD NO.:  [id number]          PATIENT TYPE:  AMB   LOCATION:  SDC                           FACILITY:  [facility]   PHYSICIAN:  Dois Davenport A. Rivard, M.D. DATE OF BIRTH:  January 07, 1971   DATE OF ADMISSION:  DATE OF DISCHARGE:                              HISTORY & PHYSICAL   HISTORY OF PRESENT ILLNESS:  Ms. Climer is a 40 year old single African  American female, para 1-0-1-1, who presents for laparoscopic ovarian  cystectomy and possible oophorectomy because of pelvic pain and a  complex right ovarian cyst.  In May 2009, the patient experienced a  sudden onset of a sharp constant pain in her right lower back and pelvic  area.  Pain at that time was rated as an 8/10 on a 10-point pain scale  and was evaluated by her primary care physician.  A subsequent CT scan  of the abdomen and pelvis did not show any signs of active disease, so  the patient was managed with Vicodin and over a period of time the pain  resolved.  An ultrasound, however, at that same time showed a septated  right ovarian cyst measuring 3.85 cm along with 2 measurable fibroids.  The largest fibroid was pedunculated and anterior measuring 5.4 x 4.0 x  5.5 cm and the smallest 2.4 x 2.3 x 2.6 cm.  Her uterus at that time  measured 9.03 x 5.25 x 7.0 cm with the left ovary measuring 3.0 x 1.7 x  3.3 cm and a right ovary measuring 4.98 x 3.17 x 4.04.  The patient did  not come in for her followup ultrasound in August 2009, and was  virtually pain free until January 2010, when she was seen at the  [facility] Ad[facility] Unit at [facility] [place].  Again, the  patient was complaining of a sudden onset of right lower quadrant pain  which radiated to her back.  At that time, the pain was rated as a 10/10  on a 10-point pain scale, was constant, and had episodes of colic  associated with it.  A pelvic ultrasound at that time showed a much  larger right  ovarian septated cyst, which measured 9.8 x 7.0 x 5.7 cm  along with 6 measurable uterine fibroids, the largest of which was  pedunculated measuring 5.0 x 4.0 x 3.8 cm and the smallest measuring 1.6  x 1.3 x 2.1 cm.  Do note on all of patient's previous imaging as well as  this one, her intrauterine device appeared to be well within the  endometrial cavity.  The patient's pain abated after the administration  of IM Toradol and was further managed with nonsteroidal antiinflammatory  medications and Vicodin until all symptoms resolved 3 days later.  Further evaluation of the patient's septated ovarian cyst showed a  normal CEA-125 comprehensive metabolic panel and CBC with the exception  of the patient's hemoglobin being in 11.8.  Currently, the patient  complains of a constant mild throbbing pain in her right lower quadrant  with no increasing factors.  She rates this  pain as a 3/10 on a 10-point  pain scale.  She denies any changes in her bowel movement, urinary tract  symptoms, vaginitis symptoms, dyspareunia or fever, though she does  admit to increased flow during her time of menses along with some pain  with certain movements.  Given the recurrent nature of her symptoms  along with the radiographic findings, which reveal an increasingly  enlarging complex ovarian cyst, the patient has decided to proceed with  laparoscopic management.   PAST MEDICAL HISTORY:   OBSTETRIC HISTORY:  Gravida 2, para 1-0-1-1.   GYNECOLOGIC HISTORY:  Menarche, 40 years old.  The patient is amenorrhea  due to the Mirena IUD being used for contraception.  She does have a  history of herpes simplex virus #2.  She also has a history of CIN-I.  Her last Pap smear was in August 2009 and that was normal.   MEDICAL HISTORY:  Positive for renal stones (last episode was in 1997),  congenital left ear deafness, migraine headaches, hypertension, and  asthma.   SURGICAL HISTORY:  In 2003, diagnostic laparoscopy for  pelvic pain and  ovarian cystectomy.  During this procedure, there were implants in the  pelvic area which were consistent with endometriosis; however, there was  no pathologic confirmation of this.  In 2004, a myomectomy.  In 2005,  left breast biopsy which was benign.  The patient denies any history of  blood transfusion or problems with anesthesia.   FAMILY HISTORY:  Cardiovascular disease, hypertension, thyroid disease,  lung cancer, breast cancer, and substance abuse.   SOCIAL HISTORY:  The patient is single and she works for [employer].   HABITS:  She does not use tobacco, alcohol, or illicit drugs.   CURRENT MEDICATIONS:  1. Metoprolol 50 mg twice daily.  2. HCTZ 25 mg daily.  3. Multivitamin daily.  4. Advair as needed.  5. Valtrex 500 mg twice daily for 3 days as needed.   ALLERGIES:  The patient has no known drug allergies and denies any  allergy to latex or shellfish.   REVIEW OF SYSTEMS:  The patient does wear glasses.  She denies any  headache, vision changes, ringing in her ears, nasal congestion, cough,  neck swelling, chest pain, shortness of breath, frequent indigestion,  nausea, vomiting, diarrhea, bloody stools, urinary tract symptoms, joint  or muscle pain, skin rashes or lesions, and except as is mentioned in  the history of present illness, the patient's review of systems is  otherwise negative.   PHYSICAL EXAMINATION:  VITAL SIGNS:  Blood pressure 130/70, pulse is 76,  respiration is 14, and temperature 95.5 degrees Fahrenheit orally.  Weight is 175-1/2 pounds, height is 5 feet 3 inches tall.  Body mass  index is 31.  NECK:  Supple without masses.  There is no thyromegaly or cervical  adenopathy.  HEART:  Regular rate and rhythm.  There is no murmur.  LUNGS:  Clear.  BACK:  No CVA tenderness.  ABDOMEN:  There is lower quadrant tenderness with voluntary guarding  bilaterally.  There is no rebound or organomegaly.  EXTREMITIES:  No   clubbing, cyanosis, or edema.  PELVIC:  EGBUS is within normal limits.  Vagina is rugous.  Cervix is  nontender without lesions.  IUD string is visible at the os.  Uterus  appears normal size, shape, and consistency.  There is tenderness which  further limits on patient's exam.  Adnexa, there is right adnexal  tenderness and fullness, though the  left adnexa without any palpable  masses or tenderness.   IMPRESSION:  1. Complex right ovarian cyst.  2. Pelvic pain.  3. Uterine fibroids.   DISPOSITION:  A discussion was held with the patient regarding the  indications for her procedure along with its risks which include, but  are not limited to reaction to anesthesia, damage to adjacent organs,  infection, excessive bleeding, the possibility of oophorectomy, and the  possibility that an open abdominal incision may be required to  complete her surgery.  The patient verbalized understanding of these  risk and has consented to proceed with laparoscopic ovarian cystectomy  along with a possibility of oophorectomy and the possibility of  myomectomy at [facility] [place] on December 20, 2008, at 9:30  a.m.      [place] J. Adline Peals.      Crist Fat Rivard, M.D.  Electronically Signed    EJP/MEDQ  D:  12/06/2008  T:  12/07/2008  Job:  [id number]

## 2011-04-08 NOTE — Op Note (Signed)
[name], [name]                ACCOUNT NO.:  [id number]   MEDICAL RECORD NO.:  [id number]          PATIENT TYPE:  AMB   LOCATION:  DAY                          FACILITY:  [facility]   PHYSICIAN:  Crist Fat. Rivard, M.D. DATE OF BIRTH:  [birth year]   DATE OF PROCEDURE:  06/08/2009  DATE OF DISCHARGE:                               OPERATIVE REPORT   PREOPERATIVE DIAGNOSIS:  Left round ligament fibroid, pelvic pain.   POSTOPERATIVE DIAGNOSIS:  Left round ligament fibroid, pelvic pain with  pelvic adhesions.   ANESTHESIA:  General by Dr. Quentin Cornwall. [place].   PROCEDURE:  Left round ligament myomectomy with laparoscopy robotic  assistance and lysis of adhesions.   SURGEON:  Dr. Dois Davenport A. Rivard.   ASSISTANT:  Dr. Guilford Shi.   ESTIMATED BLOOD LOSS:  Minimal.   PROCEDURE:  After being informed of the planned procedure with possible  complications, including bleeding, infection, injury to other organs and  need for laparotomy, as well as expected hospital stay, expected  recovery time and postoperative transient facial edema, informed consent  is obtained.  The patient is taken to OR #10, given general anesthesia  with endotracheal intubation without any complication.  She is placed in  the lithotomy position on a sticky mattress, arms padded and tucked on  each side with knee-high sequential compressive devices.  Her chest is  taped to the table.  She is prepped and draped in a sterile fashion and  a Foley catheter is inserted in her bladder.   Pelvic exam reveals an anteverted uterus, slightly generous in size, 8-9  weeks, mobile, and normal right adnexa and then a left adnexa that  appears enlarged, which we note to be the left round ligament fibroid.  A weighted speculum is inserted.  Anterior lip of the cervix is grasped  with a tenaculum forceps.  Since the patient has an intrauterine device  for contraceptive benefits decision is made not to cannulate the uterus,  but to just place  an acorn intrauterine manipulator.  The speculum is  removed.   We infiltrate the umbilical area with 5 mL of Marcaine 0.25 and perform  a semi-elliptical incision which is brought down sharply to the fascia.  The fascia is identified, grasped with Kocher forceps and incised with  Mayo scissors.  Peritoneum is entered bluntly.  A pursestring suture of  0 Vicryl is placed on the fascia and a 10-mm Hasson trocar is easily  inserted in the abdominal cavity, held in place with a pursestring  suture.  This allows for easy insufflation of a pneumoperitoneum with  maximum pressure of 18 mmHg.  The camera is inserted in the abdominal  cavity.   OBSERVATION:  Anterior cul-de-sac is normal.  The uterus is globular but  no dominant fibroid is identified.  Posterior cul-de-sac is normal.  Right tube is normal.  Right ovary has a site of recent ovulation,  measuring approximately 1 cm, with corpus luteum appearance.  Left tube  has some fine adhesions with the uterus but otherwise normal and left  ovary is normal.  We now notice this  large left round ligament fibroid  with a 1-cm round ligament in the upper quadrant and 1-cm round ligament  attached to the uterus and in between a 5.5-cm fibroid, which is easily  mobilized.  Appendix is visualized and normal.  The liver is visualized  and normal.  The gallbladder is not seen.   Trocar placement is decided and we place, under direct visualization,  two 8-mm robotic trocars on the left, one 8-mm robotic trocar on the  right and one 10-mm patient side assistant trocar on the right after  infiltrating with Marcaine 0.25.  The robot is side-docked easily and we  place a monopolar scissor in arm #1, PK gyrus forceps in arm #2 and a  tenaculum forceps in arm #3.   Preparation and docking time is completed in 45 minutes.  Console time  starts at 8:35.   The uterus is mobilized to the patient's right and a spinal needle with  vasopressin 10 units in 200  mL of saline is used to infiltrate the  serosa of the fibroid.  PK forceps is used to guide the needle and we  use a total of 7 mL to achieve adequate blanching.  Using monopolar  scissors, we perform an incision in the plane of the round ligament on a  3.5-cm distance.  This allows [place] to grab the fibroid with the tenaculum  and apply traction on it, which facilitates the sharp and blunt  dissection of the fibroid, cauterizing vessels as we encounter them.  The fibroid is then completely freed.  We irrigate the area of the round  ligament and notice satisfactory hemostasis.  The fibroid is kept on the  tenaculum while we change the scissors and PK forceps for a suture cut  and a needle holder.  Using a 2-0 Vicryl, we proceed with the closure of  the peritoneal defect in a running suture.  This is achieved easily.   The 10-mm patient side assistant trocar is then removed.  The incision  is extended slightly and we easily place an Ethicon morcellator.  We  then proceed with systematic morcellation of the fibroid, which takes  approximately 10 minutes.   Instruments are then removed.  The robot is undocked and we proceed with  profusely irrigating the pelvis with warm saline and notice satisfactory  hemostasis.  Please note that, before proceeding with the myomectomy,  the fine adhesions on the left tube were sharply dissected to free the  tube completely.   All the trocars are then removed after evacuating the pneumoperitoneum.   The fascia of the umbilical incision is closed with the previously-  placed pursestring suture of 0 Vicryl.  The fascia of the 10-mm patient  side assistant trocar is closed with a figure-of-eight stitch of 0Vicryl  under direct visualization.   The skin is closed with subcuticular suture of 3-0 Monocryl and  Dermabond.   The tenaculum and the acorn manipulator are removed from the cervix.  Cervix is evaluated and the IUD is still in its place and there is  no  bleeding.   Instrument and sponge count is complete x2.  Estimated blood loss is  minimal.  The procedure is very well-tolerated by the patient, who is  taken to recovery room and will be discharged home today in a well and  stable condition.   Preparation and docking was completed in 45 minutes.  Console time was  completed in 35 minutes.  Morcellation was completed in 10 minutes.  The  whole procedure lasted 2 hours.   SPECIMEN:  Fibroid, sent to pathology, weighing 42 grams.      Crist Fat Rivard, M.D.  Electronically Signed     SAR/MEDQ  D:  06/08/2009  T:  06/08/2009  Job:  [id number]

## 2011-04-08 NOTE — H&P (Signed)
[name], [name]                ACCOUNT NO.:  [id number]   MEDICAL RECORD NO.:  [id number]          PATIENT TYPE:  INP   LOCATION:  3021                         FACILITY:  [facility]   PHYSICIAN:  Gustavus Messing. Orlin Hilding, M.D.DATE OF BIRTH:  1971-10-27   DATE OF ADMISSION:  10/10/2008  DATE OF DISCHARGE:                              HISTORY & PHYSICAL   CHIEF COMPLAINT:  Headache with left-sided numbness and abnormal CT  scan.   HISTORY OF PRESENT ILLNESS:  [name] is a 40 year old woman with a  history of endometriosis and hypertension, who presented to the [facility] with complaints of headache and left-sided numbness.  The patient presented to work as usual around 7 a.m. on the day of  admission.  She was working on her computer, and she developed rapidly  progressive onset of a frontal headache, 10/10 without radiation.  She  described the pain as a pressure sensation with pulsatility and was  accompanied by left-sided numbness without frank weakness.  She denied  any frank visual changes, but stated that she had difficulties felt in  her vision.  No dysarthria, dysphagia, or weakness, but she did feel  dizzy when she rose from her chair.  She was seen in the [facility] and had a head CT scan.  Her pain improved with Vicodin.   PRIMARY CARE PHYSICIAN:  Lelon Perla, MD, with [facility].   PAST MEDICAL HISTORY:  1. Endometriosis.  2. Uterine fibroids.  3. Left ovarian cyst.  4. Status post left ovarian cystectomy and cauterization of the      endometriosis via lap in November 2003.  5. Status post myomectomy in January 2004.  6. History of atrophy.  7. Left galactorrhea status post left breast major duct excision in      November 2005.  8. History of headache and syncope in November 2008, seen by her PCP.      A head CT scan was recommended, but the patient did not follow      through.  9. Hypertension, diagnosed 1 year ago.   MEDICATIONS:   Metoprolol 50 mg p.o. b.i.d.   ALLERGIES:  No known drug allergies.   FAMILY HISTORY:  His father with hypertension, mother with asthma.  She  has two brothers and two sisters.  One sister has thyroid disease and  one brother has diabetes.  There is no history of hypertension or CVA or  coronary artery disease in her siblings.   SOCIAL HISTORY:  She has one son, who is 19 years old.  She works in  [employer] but lives in [place].  She denies any tobacco, alcohol, or  illicit drug use.   REVIEW OF SYSTEMS:  NEUROLOGIC:  Infrequent left lower extremity  numbness of unclear etiology.  No migraines or formed tension headache.  CARDIOVASCULAR:  The patient described an episode of chest pain while  she was driving last Sunday which was not accompanied by palpitations or  dyspnea.  PULMONARY:  No dyspnea, cough, or wheezing.  GI:  No nausea,  vomiting, abdominal pain,  rectal bleeding, diarrhea, or constipation.  URINARY:  No hematuria or dysuria.  MUSCULOSKELETAL:  No arthritis.  ENDOCRINE:  No weight or appetite changes.  The patient is not pregnant.  PSYCH:  No energy for the past few weeks, but no depression or anxiety  symptoms.   PHYSICAL EXAMINATION:  VITAL SIGNS:  Temperature 98.1, blood pressure  151/91, heart rate 56, respiratory rate 16, and O2 sat 100% on room air.  GENERAL:  A middle-aged woman in no acute distress.  HEENT:  Oropharynx are clear.  Uvula and tongue midline.  NECK:  Supple.  No carotid bruits.  No lymphadenopathy, thyromegaly, or  masses.  LUNGS:  Clear to auscultation bilaterally with good air movement.  CARDIOVASCULAR:  Regular rate and rhythm, a 2/6 systolic murmur best  heard at the left parasternal border.  ABDOMEN:  Bowel sounds positive.  Soft.  Nontender.  Nondistended.  No  palpable masses or organomegaly.  SKIN:  Warm, dry, and intact.  NEURO:  The patient is awake, alert, and oriented x3.  Cranial nerves II  through XII intact (she did have  decreased pin prick sensation in the  left cheek compared to the right on her first exam, which had resolved  on her second examination).  Strength 5/5 bilaterally in both upper  extremities and lower extremities.  Sensation to light touch normal and  symmetrical.  Deep tendon reflexes 2+ throughout.  Proprioception  intact.  No ataxia or dysmetria on finger-to-nose or heel-to-shin.  No  dysarthria.   LABORATORY DATA:  Sodium 113, potassium 3.9, chloride 103, bicarb 28,  BUN 8, creatinine 0.8, calcium 9.7, and glucose 70.  White blood count  6.6, hemoglobin 13.1 with an MCV of 90 and RDW of 12.6, platelets 295,  and hematocrit 39.  PT 14.8, INR 1.1, and PTT 36.   IMAGING:  Head CT scan showed a focal density in the globus pallidus of  the left basal ganglia.  Acute hemorrhagic lacunar infarct versus benign  calcification. In my opinion, a benign calcification is more likely  given the very small size and the location.   ASSESSMENT AND PLAN:  A 40 year old woman with hypertension and  endometriosis presented with severe frontal headache accompanied by left-  sided numbness.  Head CT scan unimpressive with a questionable small  globus pallidus hemorrhage on the left versus benign calcification.  Because of the possibility of a hemorrhage, however mild though, the  patient has to be admitted overnight for observation.  We will check an  MRI and MRA of the brain and neck.  However, it is likely that it will  not give [place] much further information.  She will need a followup CT scan  in one month to follow resolution.  If the lesion has resolved, then we  can say that she had a hemorrhage.  If it persists, then it will confirm  that it is a calcification.  We will check a fasting lipid profile.  Continue metoprolol for blood pressure control, but we will add  hydrochlorothiazide since she is not at goal.  We chose not to increase  the metoprolol given that her heart rate is already  bradycardiac on 50  mg of metoprolol.  No need for carotid Doppler if neck vessels are well  seen on the MRI.  No aspirin given the possibility of a hemorrhage.     Dawn Craven, M.D.  Electronically Signed      Gustavus Messing. Orlin Hilding, M.D.  Electronically Signed  MC/MEDQ  D:  10/10/2008  T:  10/11/2008  Job:  [id number]

## 2011-04-10 ENCOUNTER — Ambulatory Visit (INDEPENDENT_AMBULATORY_CARE_PROVIDER_SITE_OTHER): Payer: Managed Care, Other (non HMO) | Admitting: Family Medicine

## 2011-04-10 ENCOUNTER — Encounter: Payer: Self-pay | Admitting: Family Medicine

## 2011-04-10 VITALS — BP 118/76 | HR 84 | Temp 98.3°F | Wt 186.6 lb

## 2011-04-10 DIAGNOSIS — I10 Essential (primary) hypertension: Secondary | ICD-10-CM

## 2011-04-10 NOTE — Progress Notes (Signed)
  Subjective:    Patient here for follow-up of elevated blood pressure.  She is exercising and is adherent to a low-salt diet.  Blood pressure is well controlled at home. Cardiac symptoms: none. Patient denies: chest pain, chest pressure/discomfort, dyspnea, exertional chest pressure/discomfort, fatigue, irregular heart beat and palpitations. Cardiovascular risk factors: obesity (BMI >= 30 kg/m2). Use of agents associated with hypertension: none. History of target organ damage: none.  The following portions of the patient's history were reviewed and updated as appropriate: allergies, current medications, past family history, past medical history, past social history, past surgical history and problem list.  Review of Systems Pertinent items are noted in HPI.     Objective:    BP 118/76  Pulse 84  Temp(Src) 98.3 F (36.8 C) (Oral)  Wt 186 lb 9.6 oz (84.641 kg)  SpO2 99% General appearance: alert, cooperative, appears stated age and no distress Neck: no adenopathy, no carotid bruit, no JVD, supple, symmetrical, trachea midline and thyroid not enlarged, symmetric, no tenderness/mass/nodules Lungs: clear to auscultation bilaterally Heart: regular rate and rhythm, S1, S2 normal, no murmur, click, rub or gallop Extremities: extremities normal, atraumatic, no cyanosis or edema    Assessment:    Hypertension, normal blood pressure . Evidence of target organ damage: none.    Plan:    Medication: no change. Dietary sodium restriction. Regular aerobic exercise. Follow up: 6 months and as needed.

## 2011-04-10 NOTE — Patient Instructions (Signed)

## 2011-04-11 NOTE — Op Note (Signed)
NAME:  [name], [name]                          ACCOUNT NO.:  [id number]   MEDICAL RECORD NO.:  [id number]                   PATIENT TYPE:  INP   LOCATION:  9310                                 FACILITY:  [facility]   PHYSICIAN:  Crist Fat. Rivard, M.D.              DATE OF BIRTH:  03-23-1971   DATE OF PROCEDURE:  12/21/2002  DATE OF DISCHARGE:                                 OPERATIVE REPORT   PREOPERATIVE DIAGNOSIS:  Uterine fibroids with pelvic pain.   POSTOPERATIVE DIAGNOSIS:  Uterine fibroids with pelvic pain.   ANESTHESIA:  General anesthesia.   PROCEDURE:  Myomectomy.   SURGEON:  Crist Fat. Rivard, M.D.   ASSISTANTMarquis Lunch. Adline Peals.   ESTIMATED BLOOD LOSS:  100 cc.   DESCRIPTION OF PROCEDURE:  After being informed of the planned procedure  with possible complications including bleeding, infection, injury to other  organs, need for hysterectomy, post procedure tubal occlusion, informed  consent was obtained.  The patient was taken to OR #3, given general  anesthesia with endotracheal intubation without complications.  She was  placed in the dorsal decubitus position, prepped and draped in a sterile  fashion and the Foley catheter was inserted in her bladder.  We infiltrate  the suprapubic area with 10 cc of Marcaine 0.25 and proceed with a  Pfannenstiel incision which is brought down to the fascia.  The fascia is  incised in a low transverse fashion.  Linea alba is dissected and peritoneum  is entered bluntly.   OBSERVATION:  Uterus has multiple small fibroids with a large left cornual  pedunculated 6 cm, a left subserosal 3 cm, and posterior wall intramural 1  cm or less.  Both tubes are normal.  Both ovaries are normal.   We proceed with packing of the bowels and placement of the self-retaining  retractor and infiltrate the posterior wall of the uterus as well as the  pedunculated fibroid with Vasopressin 20 units and 8 cc are used.  Using the  Bovie cautery, we  incised midline the posterior wall of the uterus which  allows [place] to remove all the posterior wall fibroids as well as the  subserosal left cornual fibroid.  The stalk of the pedunculated fibroid is  then incised using Bovie and the fibroid is removed.  In total, we removed  six fibroids.  We then repair the myometrium using simple sutures of 0  Vicryl until complete reapproximation of the myometrium.   The serosa is then closed using 3-0 Vicryl baseball suture.  We then  irrigate with warm saline and note a satisfactory hemostasis.  A sheet of  Interceed is placed covering the entire uterus freeing the two tubes.  Packings are removed and the fascia hemostasis was then completed with  cautery.  Fascia is closed with two running sutures of 0 Vicryl meeting  midline.  _______ is irrigated with warm saline and  skin is closed with a  subcuticular 3-0 Monocryl suture and Steri-Strips.   Instrument and sponge count is complete x2.  Estimated blood loss is 100 cc.  The patient tolerated the procedure very well and is taken to the recovery  room in a well and stable condition.                                               Crist Fat Rivard, M.D.    SAR/MEDQ  D:  12/21/2002  T:  12/21/2002  Job:  [id number]

## 2011-04-11 NOTE — Op Note (Signed)
NAME:  [name], [name]                          ACCOUNT NO.:  [id number]   MEDICAL RECORD NO.:  [id number]                   PATIENT TYPE:  AMB   LOCATION:  [facility]                                 FACILITY:  [facility]   PHYSICIAN:  Crist Fat. Rivard, M.D.              DATE OF BIRTH:  04-05-1971   DATE OF PROCEDURE:  10/12/2002  DATE OF DISCHARGE:                                 OPERATIVE REPORT   PREOPERATIVE DIAGNOSES:  1. Acute pelvic pain.  2. Left ovarian cyst.  3. Possible ovarian torsion.   POSTOPERATIVE DIAGNOSES:  1. Left ovarian cyst.  2. Endometriosis.  3. Uterine fibroids.   ANESTHESIA:  General.   PROCEDURE:  Left ovarian cystectomy via laparoscopy with cauterization of  endometriosis.   SURGEON:  Crist Fat. Rivard, M.D.   ASSISTANTMarquis Lunch. Adline Peals.   ESTIMATED BLOOD LOSS:  50 cc.   PROCEDURE:  After being informed of the planned procedure with possible  complications including bleeding, infection, injury to bowel, bladder, or  ureter, need for laparotomy, need for removal of the whole adnexa, informed  consent was obtained.  The patient was taken to OR number four and given  general anesthesia with endotracheal intubation.  She was placed in the  lithotomy position, prepped and draped in a sterile fashion, and the Foley  catheter was inserted in her bladder as well as an Personal assistant in her  uterus.  We proceeded with infiltration of the umbilical area with Marcaine  0.256 cc in a semi-elliptical incision.  We then inserted a Veress needle to  insufflate a pneumoperitoneum using CO2 at a maximum pressure of 15 mmHg.  Veress needle was removed to insert a 10 mm trocar and the laparoscope.  A  10 mm suprapubic trocar was inserted under direct visualization after  infiltration with 4 cc of Marcaine 0.25.  In the left lower quadrant a 5 mm  trocar was inserted under direct visualization after infiltration with 2 cc  of Marcaine 0.25.   Observation:   Anterior cul-de-sac is normal.  Uterus has a large left  cornual pedunculated fibroid with a slight discoloration possibly with  degeneration measuring 5-6 cm.  Right adjacent to it a subserosal fibroid of  3-4 cm.  Then, a small subserosal posterior lower body fibroid of 1.5-2 cm.  Both tubes are seen and normal.  Right ovary is normal.  Left ovary has a  simple appearing cyst measuring 8 cm with a thin wall translucent  appearance, normal IP ligament with no evidence of torsion, normal utero-  ovarian ligament, no nodularity on the surface.  The cul-de-sac has 60 cc of  yellow colored fluid which is aspirated and sent for cytology.  All  peritoneal surfaces are normal except for a small brown lesion on the  posterior cul-de-sac adjacent to the left uterosacral ligament compatible  with endometriosis.  Liver edges normal.  Appendix  is seen, is normal.  No  other disease is noted in the abdominopelvic cavity.  We decide to proceed  with ovarian cystectomy and infiltrate the capsule of the ovary with 5 cc of  vasopressin 20 units and 100 cc of saline.  The capsule is then cauterized  and incised with Metzenbaum scissors on a 3 cm distance.  This allows [place] to  bluntly dissect the capsule of the cyst off of the ovary using traction  countertraction.  This capsule is sent for pathology.  We then irrigate with  warm saline abundantly and cauterize two internal sites of bleeding.  Please  note that upon opening of this cyst clear fluid was evacuated and  laparoscope was inserted into the cyst noting a regular internal capsule, no  nodularity, no abnormal vessels.  We then irrigate abundantly and note a  satisfactory hemostasis.  The endometriotic lesion is then cauterized.  Both  trocars are removed under direct visualization with satisfactory hemostasis.  We then remove all instruments after evacuating pneumoperitoneum.  The  fascia of the two 10 mm incision is closed with figure-of-eight  stitches of  0 Vicryl and skin is closed with subcuticular sutures of 4-0 Vicryl with  Steri-Strips.   Instruments and sponge count is complete x2.  Estimated blood loss is 50 cc.  The procedure is very well tolerated by the patient who is taken to recovery  room in a well and stable condition.                                               Crist Fat Rivard, M.D.    SAR/MEDQ  D:  10/12/2002  T:  10/12/2002  Job:  [id number]

## 2011-04-11 NOTE — Op Note (Signed)
NAME:  [name], [name]                          ACCOUNT NO.:  [id number]   MEDICAL RECORD NO.:  [id number]                   PATIENT TYPE:  MAT   LOCATION:  [facility]                                 FACILITY:  [facility]   PHYSICIAN:  Janine Limbo, M.D.            DATE OF BIRTH:  10/14/1971   DATE OF PROCEDURE:  10/18/2002  DATE OF DISCHARGE:                                 OPERATIVE REPORT   HISTORY OF PRESENT ILLNESS:  The patient is a 40 year old female para 0-1-0-  1 who had a diagnostic laparoscopy on October 12, 2002 at which time she  had a cyst from her left ovary drained by Dr. Dois Davenport Rivard.  The patient  was also noted to have a fibroid.  The patient reports that she did well at  home following her surgery.  She has been able to eat and drink as  necessary.  She reports that on October 16, 2002 she was more active than  she normally has been.  She cares for her child at home.  She began having  more abdominal pain and then on October 17, 2002 she reports that she  fainted at home.  She did call the emergency medical service, but decided  not to go to the hospital.  She presents today complaining of headache that  radiates from the front to the back, vaginal spotting, and increased  abdominal cramping.  She denied diarrhea and fever.   ALLERGIES:  None known.   OBSTETRICAL HISTORY:  The patient has had a vaginal delivery at [redacted] weeks  gestation.   PHYSICAL EXAMINATION:  VITAL SIGNS:  Temperature 98, pulse 79, respirations  16, blood pressure 138/71.  HEENT:  Within normal limits.  The patient speaks easily and does not wince  from discomfort.  CHEST:  Clear.  HEART:  Regular rate and rhythm.  ABDOMEN:  Soft and nontender.  Her incisions are well healed.  Her bowel  sounds are normal.  PELVIC:  External genitalia is normal.  The vagina is nontender.  The cervix  is nontender to motion.  The uterus is normal size and consistency and  nontender.  Adnexa:  No masses  appreciated.   LABORATORY VALUES:  White blood cell count is 9700.  Hemoglobin is 12.1.  Platelet count is 332,000.  Urinalysis shows positive ICTO test, large  hemoglobin, white cells, red cells, and rare bacteria.   ASSESSMENT:  1. Status post diagnostic laparoscopy on October 12, 2002.  2. Probable urinary tract infection.   PLAN:  The patient was given Darvocet for pain.  She was also given Septra  DS one p.o. b.i.d. for three days.  Her urine was sent for a culture.  She  will remain out of work for another six days.  She will call for questions  or concerns.  She will call if she does not improve.  Janine Limbo, M.D.    AVS/MEDQ  D:  10/18/2002  T:  10/18/2002  Job:  [id number]

## 2011-04-11 NOTE — H&P (Signed)
[name], [name] NO.:  [id number]   MEDICAL RECORD NO.:  [id number]                   PATIENT TYPE:   LOCATION:                                       FACILITY:   PHYSICIAN:  Crist Fat. Rivard, M.D.              DATE OF BIRTH:   DATE OF ADMISSION:  12/21/2002  DATE OF DISCHARGE:                                HISTORY & PHYSICAL   REASON FOR ADMISSION:  Uterine fibroids with pelvic pain.   HISTORY OF PRESENT ILLNESS:  This is a 40 year old single African-American  woman, gravida 2, para 1, abortus 1, who has been followed in our office  complaining of pelvic pain since November 2003.  She reported an onset of  acute pelvic pain at the end of October which lasted 5-10 days, was worse on  her left side, reaching an intensity of 10/10 with difficulty walking.  She  had previously used Depo-Provera which she had discontinued in June 2003 and  was now using Ortho Evra with a normal cycle but light.  An initial  ultrasound on that visit revealed an increased uterine size measuring 11.4 x  4.9 x 5.6 cm with multiple fibroids varying from 2 to 3 cm with a fundal  pedunculated fibroid of 6.4 x 5 x 6.2 cm.  Endometrial canal was 1.1 cm,  right ovary was normal, left ovary had a large simple cyst measuring 5.5 x  6.4 x 7 cm.  Her urine culture was negative.  GC and Chlamydia cultures were  negative.  To rule out a portion of the adnexal mass, an ultrasound was  ordered at [facility] three days later with a Doppler study and it  revealed an enlarged uterus measuring 14 cm x 4.7 cm, at least 4 fibroids  are noted with the largest being subserosal projecting from the left uterine  fundus measuring 4.9 x 4.9 x 4.5 cm, a subserosal fibroid also was  projecting from the right uterine fundus measuring 3.3 x 2.4 x 3.7 cm.  The  right ovary was normal and a large simple cyst was seen in the left adnexa  with a thin rim of ovarian tissue measuring 8 x 4.9 x 7.8  cm and blood flow  was seen within the adjacent left ovarian tissue on color Doppler.  On  November 19th, the patient presented with acute severe pain and was seen at  the emergency room with guarding and rebound on her abdominal pelvic exam  and decision was made to proceed with a laparoscopy at that time.  She  underwent left ovarian cystectomy via laparoscopy which final pathology  revealed a benign cyst.  Operative findings revealed a normal anterior cul-  de-sac, a large left cornual pedunculated fibroid with a slight  discoloration possibly with degeneration measuring 5-6 cm.  Two other small  subserosal fibroids were noted, both tubes were seen and normal, right ovary  is normal, left ovary had a simple-appearing cyst measuring 8 cm with a thin-  walled translucent appearance, and cul-de-sac had a minimal amount of free  fluid.   Surgical course was uneventful, postoperative course was uneventful, but the  patient is still experiencing pain and mainly is experiencing dysmenorrhea  lasting 3-4 days despite the Ortho Evra.  She is now requesting myomectomy  with preservation of the uterus and preservation of fertility and this is  scheduled on December 21, 2002.   REVIEW OF SYSTEMS:  CONSTITUTIONAL:  Negative.  HEAD, EYES, EARS, NOSE, AND  THROAT:  Normal, thyroid normal.  CARDIOVASCULAR:  Normal.  RESPIRATORY:  Normal.  GASTROINTESTINAL:  Normal.  PSYCHOLOGICAL/PSYCHIATRIC:  Normal.  NEUROLOGICAL:  Normal.   PAST MEDICAL HISTORY:  1. Herpes simplex infection.  2. Uterine fibroids.  3. No known drug allergies.  4. Spontaneous vaginal delivery of a little boy three years ago with no     complication.   FAMILY HISTORY:  Unremarkable.   SOCIAL HISTORY:  Single.  Nonsmoker.  Works in Clinical biochemist.   PHYSICAL EXAMINATION:  VITAL SIGNS:  Current weight is 168 pounds, blood  pressure 120/84.  HEAD, EYES, EARS, NOSE, AND THROAT:  Negative.  NECK:  Thyroid not enlarged.  HEART:   Regular rate and rhythm.  CHEST:  Clear.  BACK:  No CVA tenderness.  ABDOMEN:  No tenderness, masses or hepatosplenomegaly.  EXTREMITIES:  Negative.  NEUROLOGICAL:  Within normal limits.  GYNECOLOGICAL:  Reveals a normal external genitalia, normal vagina, normal  cervix, uterus is anteverted with a cornual right fibroid measuring  approximately 5 cm slightly tender, adnexa are normal.   ASSESSMENT:  Uterine fibroids with significant dysmenorrhea in a patient  desiring to preserve her fertility.  The patient is a Scientist, product/process development.   PLAN:  We will proceed with myomectomy on December 21, 2002.  Both procedure  and possible complications have been thoroughly reviewed with the patient  including bleeding, infection, injury to other organs, postoperative tubal  occlusion.  We have also addressed the patient's faith and belief and under  no circumstances would she agree to receive any blood or blood products.                                               Crist Fat Rivard, M.D.    SAR/MEDQ  D:  12/12/2002  T:  12/12/2002  Job:  [id number]

## 2011-04-11 NOTE — Op Note (Signed)
[name], [name]                ACCOUNT NO.:  [id number]   MEDICAL RECORD NO.:  [id number]          PATIENT TYPE:  AMB   LOCATION:  [facility]                         FACILITY:  [facility]   PHYSICIAN:  Vikki Ports, MDDATE OF BIRTH:  Apr 03, 1971   DATE OF PROCEDURE:  10/09/2004  DATE OF DISCHARGE:                                 OPERATIVE REPORT   PREOPERATIVE DIAGNOSIS:  Bloody left nipple discharge.   POSTOPERATIVE DIAGNOSIS:  Bloody left nipple discharge.   PROCEDURE:  Left breast major duct excision.   ANESTHESIA:  General.   DESCRIPTION OF PROCEDURE:  The patient was taken to the operating room and  placed in the supine position.  After adequate general anesthesia was  induced using laryngeal mask, the left breast was prepped and draped in the  normal sterile fashion.   On manipulation of the breast, the discharging duct was identified, and a  lacrimal duct probe was placed within it.  This extended to the lateral  aspect of the left breast.  A curvilinear incision was made at the areolar  margin and dissected down onto an obviously large cystic mass.  I excised  that and the duct all the way back to the nipple.  This was sent for  pathologic evaluation.  Adequate hemostasis was insured.  This skin was  closed with subcuticular 4-0 Monocryl.  Steri-Strips and sterile dressings  were applied.   The patient tolerated the procedure well and went to the PACU in good  condition.     [name]   KRH/MEDQ  D:  10/09/2004  T:  10/10/2004  Job:  [id number]

## 2011-04-11 NOTE — Discharge Summary (Signed)
NAME:  [name], SCARPATI                          ACCOUNT NO.:  [id number]   MEDICAL RECORD NO.:  [id number]                   PATIENT TYPE:  INP   LOCATION:  9310                                 FACILITY:  [facility]   PHYSICIAN:  Crist Fat. Rivard, M.D.              DATE OF BIRTH:  [birth year]   DATE OF ADMISSION:  12/21/2002  DATE OF DISCHARGE:  12/23/2002                                 DISCHARGE SUMMARY   DISCHARGE DIAGNOSIS:  Fibroid uterus, dysmenorrhea.   OPERATION:  On the date of admission the patient underwent an abdominal  myomectomy in which she was found to have multiple fibroids - a total of  five removed - with the largest measuring approximately 6 cm x 3.5 cm.  Additionally, the patient appeared to have normal ovaries and tubes.   HISTORY OF PRESENT ILLNESS:  The patient is a 40 year old single African-  American female para 1-0-1-1 who presents for myomectomy because of severe  pelvic pain which has over time increased in intensity and has responded  only minimally to hormonal therapy.  Please see the patient's dictated  History and Physical Examination for details.   PHYSICAL EXAMINATION:  VITAL SIGNS:  Weight is 168 pounds, blood pressure  120/84.  GENERAL:  Within normal limits.  GYNECOLOGIC:  Reveals a normal external genitalia, normal vagina, normal  cervix.  Uterus was anteverted with a cornual right fibroid measuring  approximately 5 cm which was slightly tender.  Adnexa was normal.   HOSPITAL COURSE:  On the date of admission the patient underwent  aforementioned procedure, tolerating it well.  Postoperative course was  unremarkable with the patient resuming bowel and bladder function by  postoperative day #2 and therefore deemed ready for discharge home.  Postoperative hemoglobin 10.6 (preoperative hemoglobin 12.9).   DISCHARGE MEDICATIONS:  1. Tylox one to two tablets q.4-6h. as needed for pain.  2. Ibuprofen 600 mg one tablet q.6h. with food for five days  then as needed     for pain.  3. Phenergan 12.5 mg one tablet q.6h. if needed for nausea.  4. Colace 100 mg one tablet twice daily until bowel movements are regular.  5. Iron 325 mg one tablet twice daily for six weeks.   FOLLOW-UP:  The patient has a six weeks postoperative exam with Dr. Estanislado Pandy  on February 01, 2003 at 9:15 a.m.   DISCHARGE INSTRUCTIONS:  1. The patient was given a copy of [facility] and     Gynecology postoperative instruction sheet.  2. She was further advised to avoid driving for two weeks, heavy lifting for     four weeks, and intercourse for six     weeks.  3. The patient's diet was without restrictions.   FINAL PATHOLOGY:  Uterine fibroid:  Five benign leiomyomata, 76 grams.     Elmira J. Powell, P.A.  Crist Fat Rivard, M.D.    EJP/MEDQ  D:  01/19/2003  T:  01/20/2003  Job:  [id number]

## 2011-04-11 NOTE — Consult Note (Signed)
NAME:  [name], [name]                          ACCOUNT NO.:  [id number]   MEDICAL RECORD NO.:  [id number]                   PATIENT TYPE:  MAT   LOCATION:  [facility]C                                 FACILITY:  [facility]   PHYSICIAN:  Janine Limbo, M.D.            DATE OF BIRTH:  [birth year]   DATE OF CONSULTATION:  11/16/2003  DATE OF DISCHARGE:                                   CONSULTATION   REPORT TITLE:  GYNECOLOGY CONSULTATION   CONSULTING PHYSICIAN:  A. Marline Backbone, M.D.   HISTORY OF PRESENT ILLNESS:  [name] is a 40 year old female, para 1-0-0-  1, who presents to the paternity admissions area complaining of severe left  lower quadrant abdominal pain. The patient has been followed at the [facility] and Gynecology Division of [facility].  The patient was noted to have a 4 cm ovarian cyst approximately six  weeks ago. Repeat ultrasound showed that the cyst had increased in size to  approximately 5 cm. The patient complains of increasing abdominal pain.   Today, she reports that the pain was radiating 8/10 and she could not  tolerate the discomfort any longer. The patient has a history of diagnostic  laparoscopy approximately two years ago for pain. She also had a myomectomy  one year ago.  The patient reports that she has a past history of herpes  simplex virus, but denies any other history of sexually transmitted  infections. She denies any other gynecological surgeries. Her most recent  Pap smear was in the summer of 2004. She complains of a vaginal discharge.  The patient does have a history of kidney stones and her last kidney stone  was in 1997. She had a CT scan done recently and no evidence of kidneys  stones was seen. The patient is currently using NuvaRing for contraception.  She was recently switched from the Ortho Evra patch.   OBSTETRICAL HISTORY:  The patient has had one vaginal delivery at [redacted] weeks  gestation of a healthy  female infant.   PAST MEDICAL HISTORY:  The patient denies hypertension and diabetes.   ALLERGIES:  No known drug allergies.   SOCIAL HISTORY:  The patient denies cigarette use, alcohol use, and  recreational drug use.   REVIEW OF SYSTEMS:  The patient has bowel movements every other day. She  complains of a vaginal discharge.   FAMILY HISTORY:  Noncontributory.   PHYSICAL EXAMINATION:  VITAL SIGNS:  Temperature 99.6, pulse 82,  respirations 22, blood pressure 137/78.  HEENT:  Within normal limits.  CHEST:  Clear.  HEART:  Regular rate and rhythm.  ABDOMEN:  Soft and nontender. There is guarding in the lower quadrants,  greater than the upper quadrants. There is no rebound tenderness.  EXTREMITIES:  Within normal limits.  NEUROLOGIC:  Grossly normal.  PELVIC:  External genitalia is normal. Vagina is normal. Cervix is  nontender.  The uterus is eight weeks size, and tender at the fundus. Adnexa  are tender with the left being greater than the right. There is fullness  noted on the left.   LABORATORY DATA:  Hemoglobin 12.8, white blood cell count 7600.  Urinalysis  is negative, except for a small amount of blood. Pregnancy test is negative.   ASSESSMENT:  1. Abdominal pain.  2. Ovarian cyst.   PLAN:  1. A long discussion was held with the patient about the medical and     surgical management of abdominal pain. She wants to avoid surgery if at     all possible. She understands that removal of the cyst may be the only     thing that relieves her discomfort. We discussed different pain     medications.  2. The patient is given Toradol 60 mg IM.  3. The patient is given Vicodin 1-2 tablets q.4 h. p.r.n. pain. She is given     a total of 30 tablets with one refill.  4. The patient is also given Motrin 600 mg one tablet q.6 h. p.r.n. pain.     She is given 30 tablets with two refills.  5. Wet prep is pending at this point and if clue cells are found, then we     will treat the  patient with metronidazole 500 mg b.i.d. X7 days.  6. The patient will return to see Dr. Dois Davenport Rivard in 2-3 weeks or she will     call any time that she feels our services are needed.  7. Gonorrhea and Chlamydia cultures sent.                                               Janine Limbo, M.D.    AVS/MEDQ  D:  11/16/2003  T:  11/16/2003  Job:  [id number]

## 2011-04-14 ENCOUNTER — Emergency Department (INDEPENDENT_AMBULATORY_CARE_PROVIDER_SITE_OTHER): Payer: Managed Care, Other (non HMO)

## 2011-04-14 ENCOUNTER — Emergency Department (HOSPITAL_BASED_OUTPATIENT_CLINIC_OR_DEPARTMENT_OTHER)
Admission: EM | Admit: 2011-04-14 | Discharge: 2011-04-14 | Disposition: A | Discharge status: Home / Self Care | Payer: Managed Care, Other (non HMO) | Attending: Emergency Medicine | Admitting: Emergency Medicine

## 2011-04-14 DIAGNOSIS — S8000XA Contusion of unspecified knee, initial encounter: Secondary | ICD-10-CM

## 2011-04-14 DIAGNOSIS — S0003XA Contusion of scalp, initial encounter: Secondary | ICD-10-CM | POA: Insufficient documentation

## 2011-04-14 DIAGNOSIS — S0083XA Contusion of other part of head, initial encounter: Secondary | ICD-10-CM | POA: Insufficient documentation

## 2011-04-14 DIAGNOSIS — W108XXA Fall (on) (from) other stairs and steps, initial encounter: Secondary | ICD-10-CM | POA: Insufficient documentation

## 2011-04-14 DIAGNOSIS — Y92009 Unspecified place in unspecified non-institutional (private) residence as the place of occurrence of the external cause: Secondary | ICD-10-CM | POA: Insufficient documentation

## 2011-04-14 DIAGNOSIS — J45909 Unspecified asthma, uncomplicated: Secondary | ICD-10-CM | POA: Insufficient documentation

## 2011-04-14 DIAGNOSIS — S40019A Contusion of unspecified shoulder, initial encounter: Secondary | ICD-10-CM

## 2011-04-14 DIAGNOSIS — I10 Essential (primary) hypertension: Secondary | ICD-10-CM | POA: Insufficient documentation

## 2011-07-02 ENCOUNTER — Ambulatory Visit (INDEPENDENT_AMBULATORY_CARE_PROVIDER_SITE_OTHER): Payer: Managed Care, Other (non HMO) | Admitting: Family

## 2011-07-02 ENCOUNTER — Encounter (HOSPITAL_BASED_OUTPATIENT_CLINIC_OR_DEPARTMENT_OTHER): Payer: Self-pay

## 2011-07-02 ENCOUNTER — Emergency Department (INDEPENDENT_AMBULATORY_CARE_PROVIDER_SITE_OTHER): Payer: Managed Care, Other (non HMO)

## 2011-07-02 ENCOUNTER — Emergency Department (HOSPITAL_BASED_OUTPATIENT_CLINIC_OR_DEPARTMENT_OTHER)
Admission: EM | Admit: 2011-07-02 | Discharge: 2011-07-02 | Disposition: A | Discharge status: Home / Self Care | Payer: Managed Care, Other (non HMO) | Attending: Emergency Medicine | Admitting: Emergency Medicine

## 2011-07-02 DIAGNOSIS — R079 Chest pain, unspecified: Secondary | ICD-10-CM

## 2011-07-02 DIAGNOSIS — R404 Transient alteration of awareness: Secondary | ICD-10-CM | POA: Insufficient documentation

## 2011-07-02 DIAGNOSIS — K219 Gastro-esophageal reflux disease without esophagitis: Secondary | ICD-10-CM | POA: Insufficient documentation

## 2011-07-02 DIAGNOSIS — J45909 Unspecified asthma, uncomplicated: Secondary | ICD-10-CM | POA: Insufficient documentation

## 2011-07-02 DIAGNOSIS — I10 Essential (primary) hypertension: Secondary | ICD-10-CM | POA: Insufficient documentation

## 2011-07-02 HISTORY — DX: Herpesviral infection of urogenital system, unspecified: A60.00

## 2011-07-02 LAB — BASIC METABOLIC PANEL
Chloride: 101 mEq/L (ref 96–112)
GFR calc Af Amer: >60 mL/min (ref >60)
GFR calc non Af Amer: >60 mL/min (ref >60)
Potassium: 3.2 mEq/L — ABNORMAL LOW (ref 3.5–5.1)

## 2011-07-02 LAB — URINALYSIS, ROUTINE W REFLEX MICROSCOPIC
Hgb urine dipstick: NEGATIVE
Leukocytes, UA: NEGATIVE
Protein, ur: NEGATIVE mg/dL
Specific Gravity, Urine: 1.016 (ref 1.005–1.030)
Urobilinogen, UA: 1 mg/dL (ref 0.0–1.0)

## 2011-07-02 LAB — DIFFERENTIAL
Basophils Absolute: 0 10*3/uL (ref 0.0–0.1)
Basophils Relative: 1 % (ref 0–1)
Lymphocytes Relative: 41 % (ref 12–46)
Monocytes Absolute: 0.6 10*3/uL (ref 0.1–1.0)
Monocytes Relative: 7 % (ref 3–12)
Neutro Abs: 4.1 10*3/uL (ref 1.7–7.7)
Neutrophils Relative %: 49 % (ref 43–77)

## 2011-07-02 LAB — CBC
HCT: 37 % (ref 36.0–46.0)
Hemoglobin: 12.9 g/dL (ref 12.0–15.0)
RDW: 12.7 % (ref 11.5–15.5)
WBC: 8.3 10*3/uL (ref 4.0–10.5)

## 2011-07-02 LAB — GLUCOSE, CAPILLARY: Glucose-Capillary: 99 mg/dL (ref 70–99)

## 2011-07-02 LAB — PREGNANCY, URINE: Preg Test, Ur: NEGATIVE

## 2011-07-02 MED ORDER — POTASSIUM CHLORIDE CRYS ER 20 MEQ PO TBCR
40.0000 meq | EXTENDED_RELEASE_TABLET | Freq: Once | ORAL | Status: AC
  Administered 2011-07-02: 40 meq via ORAL
  Filled 2011-07-02: qty 2

## 2011-07-02 MED ORDER — IBUPROFEN 800 MG PO TABS
800.0000 mg | ORAL_TABLET | Freq: Once | ORAL | Status: AC
  Administered 2011-07-02: 800 mg via ORAL
  Filled 2011-07-02: qty 1

## 2011-07-02 NOTE — ED Notes (Signed)
I took cbg and got reading of 99 ml/dcltr. I notified Dr.

## 2011-07-02 NOTE — ED Notes (Signed)
Called into pt's room-states that she wants to go home-"i've been through all this testing before"-EDP Hunt notified-pt then advised results complete and EDP is reviewing

## 2011-07-02 NOTE — Assessment & Plan Note (Signed)
Pt referred to ED for further evaluation

## 2011-07-02 NOTE — Progress Notes (Signed)
Subjective:    Patient ID: [name], female    DOB: 30-May-1971, 40 y.o.   MRN: [id number]  HPI Pt was called in the waiting room by CMA and reportedly fell to knees grabbing chest in pain/screaming.  We requested RN and stretcher to waiting room from ED as patient was not stable enough to sit in wheelchair.  Vitals were taken 150/101, pulse 94, O2 sat was 100%.  I came to waiting room to assess pt who appeared to be in distress.  She reported 24 hr history of chest pain and prior history of cardiac cath/holter monitor.  Pulse was initially even and strong.  She then became unconscious with shallow respirations and developed thready barely palpable pulse.  A code was called.  Pt was unconscious for approximately 30 seconds-1 minute.  Then regained consciousness, appeared confused. Pulse normalized.  RN and code team arrived at that point and pt was transported directly down to the ED.      Review of Systems Pt reports + nausea, + chest pain radiating between shoulder blades.  Reports + shortness of breath.     see HPI  Past Medical History  Diagnosis Date  . Allergy   . Asthma   . GERD (gastroesophageal reflux disease)   . Hypertension     pulmonary  . Herpes genitalia     History   Social History  . Marital Status: Single    Spouse Name: N/A    Number of Children: N/A  . Years of Education: N/A   Occupational History  . Not on file.   Social History Main Topics  . Smoking status: Never Smoker   . Smokeless tobacco: Never Used  . Alcohol Use: No  . Drug Use: No  . Sexually Active: Not on file   Other Topics Concern  . Not on file   Social History Narrative  . No narrative on file    Past Surgical History  Procedure Date  . Myomectomy 2006  . Ovarian cyst removal 2010    x2  . Breast lumpectomy 01/06/11    Family History  Problem Relation Age of Onset  . Asthma Mother   . Hypertension Father   . Heart disease Maternal Grandmother     PCI    No Known  Allergies  Current Outpatient Prescriptions on File Prior to Visit  Medication Sig Dispense Refill  . albuterol (PROVENTIL HFA) 108 (90 BASE) MCG/ACT inhaler Inhale 2 puffs into the lungs 4 (four) times daily.        . hydrochlorothiazide 25 MG tablet Take 25 mg by mouth daily.        . metoprolol (LOPRESSOR) 50 MG tablet Take 50 mg by mouth 2 (two) times daily.        . potassium chloride SA (K-DUR,KLOR-CON) 20 MEQ tablet Take 20 mEq by mouth daily.        . valACYclovir (VALTREX) 500 MG tablet Take 500 mg by mouth daily.          Ht 5' 4.02" (1.626 m)    Objective:   Physical Exam  Constitutional:       Uncomfortable appearing african [place] female in Acute distress.    Cardiovascular: S1 normal and S2 normal.        Initial cardiac assessment noted s1/s2, RRR no murmur.  (see HPI)  Pulmonary/Chest:       Initial assessment noted breath sounds clear to auscultation without wheezes, rales or rhonchi.  See HPI for additional findings.          Assessment & Plan:

## 2011-07-02 NOTE — ED Notes (Signed)
Recurrent CP, syncope x 1 year-clean cath dec 2011-wore heart monitor x 3 weeks-was being seen at PCP today for CP-felt dizzy when she stood-was sat down by staff-PA Peggyann Juba reports pt was unresponsive x 15sec while seated in chair with thready pulse

## 2011-07-02 NOTE — ED Provider Notes (Signed)
History     CSN: [id number] Arrival date & time: 07/02/2011  2:31 PM  Chief Complaint  Patient presents with  . Chest Pain  . Loss of Consciousness   Patient is a 40 y.o. female presenting with chest pain and syncope. The history is provided by the patient. No language interpreter was used.  Chest Pain The chest pain began less than 1 hour ago. Duration of episode(s) is 15 seconds. Chest pain occurs intermittently. The chest pain is resolved. The pain is associated with exertion. At its most intense, the pain is at 10/10. The pain is currently at 0/10. The quality of the pain is described as aching. The pain does not radiate. Primary symptoms include syncope.  Associated symptoms include near-syncope. She tried nothing for the symptoms.  Her past medical history is significant for hypertension.    Loss of Consciousness Associated symptoms include chest pain.   Patient was brought to the ED today after presenting for her primary care appointment. Apparently patient stood up had 15 seconds of chest pain and then it was reportedly unresponsive for 15 seconds. A call was placed from the primary care practice upstairs to the ED stating that they had a code. Patient was hemodynamically stable and responsive upon her arrival. CBG was within normal limits. Agent has had numerous episodes like this. She has had testing including heart catheterization, echocardiogram, chest CT, neurologic and Cardiologic evaluation, as well as MRI of the brain. All testing has been negative thus far. Patient was presenting to her appointment to have further evaluation today for this complaint. She is completely neurologically intact, awake, and describes very minimal chest pain at this point. She does have history of asthma but states that this is different than her asthma.  Past Medical History  Diagnosis Date  . Allergy   . Asthma   . GERD (gastroesophageal reflux disease)   . Hypertension     pulmonary  . Herpes  genitalia     Past Surgical History  Procedure Date  . Myomectomy 2006  . Ovarian cyst removal 2010    x2  . Breast lumpectomy 01/06/11    Family History  Problem Relation Age of Onset  . Asthma Mother   . Hypertension Father   . Heart disease Maternal Grandmother     PCI    History  Substance Use Topics  . Smoking status: Never Smoker   . Smokeless tobacco: Never Used  . Alcohol Use: No    OB History    Grav Para Term Preterm Abortions TAB SAB Ect Mult Living                  Review of Systems  Constitutional: Negative.   HENT: Negative.   Eyes: Negative.   Cardiovascular: Positive for chest pain, syncope and near-syncope.  Gastrointestinal: Negative.   Genitourinary: Negative.   Musculoskeletal: Negative.   Skin: Negative.   Neurological:       Presyncope  Hematological: Negative.   Psychiatric/Behavioral: Negative.   All other systems reviewed and are negative.    Physical Exam  BP 127/72  Pulse 82  Temp(Src) 98.3 F (36.8 C) (Oral)  Resp 16  Ht 5\' 3"  (1.6 m)  Wt 181 lb (82.101 kg)  BMI 32.06 kg/m2  SpO2 100%  Physical Exam  Nursing note and vitals reviewed. Constitutional: She is oriented to person, place, and time. She appears well-developed and well-nourished. No distress.  HENT:  Head: Normocephalic and atraumatic.  Eyes: Conjunctivae and EOM  are normal. Pupils are equal, round, and reactive to light.  Neck: Normal range of motion.  Cardiovascular: Normal rate, regular rhythm and normal heart sounds.  Exam reveals no gallop and no friction rub.   No murmur heard. Pulmonary/Chest: Effort normal and breath sounds normal. No respiratory distress. She has no wheezes. She has no rales. She exhibits no tenderness.  Abdominal: Soft. Bowel sounds are normal. She exhibits no distension. There is no tenderness. There is no rebound and no guarding.  Musculoskeletal: Normal range of motion.  Neurological: She is alert and oriented to person, place,  and time. No cranial nerve deficit. She exhibits normal muscle tone. Coordination normal.  Skin: Skin is warm and dry. No rash noted.  Psychiatric: She has a normal mood and affect.    ED Course  Procedures  Date: 07/02/2011 EKG: Unfortunately original EKG is not available. EKG did not store and immune system. This was evaluated by this has not been able to be found. There were no ST segment elevations or depressions. Patient was in normal sinus rhythm no conduction abnormalities noted. Unfortunately due to the new patient occupying the patient's former room this information is been deleted out of the monitor where 12-lead EKG was performed. Exhaustive search has been made for the original EKG seen in   MDM The patient was evaluated by myself and the primary care office upstairs when response was called. Patient was awake and hemodynamically stable. Fingerstick was within normal limits. Workup here for her chest pain included an EKG, chest x-ray, and labs. Patient had negative troponin as well as CBC and renal panel. Chest x-ray was unremarkable an EKG reviewed by myself been witnessed by nursing staff was within normal limits. There were no changes compared to previous which is still available and immune system. Patient remained hemodynamically stable here. Patient has had extensive workup for her chest pain in the past. We discussed the risks versus benefits of performing a chest CT or a d-dimer today. Given that patient has had exact events numerous times and has had negative studies numerous times she preferred not to have chest CT today. I was comfortable with this given the patient's past medical history. Patient will followup with her primary care physician for further evaluation of this ongoing problem. After speaking at length with the patient she has not had total table testing which might be of benefit given the circumstances in which this pain often occurs.  Assessment: 40 year-old female  with history of numerous episodes of chest pain with no noted etiology who presents today after having a similar episode now resolved.  Plan: Initial thoughts her primary care physician. She's comfortable discharged home and was discharged home in good condition. She was given precautions to avoid driving if she is having periods where she has near-syncope.      Cyndra Numbers, MD 07/02/11 [phone]

## 2011-07-07 ENCOUNTER — Telehealth: Payer: Self-pay | Admitting: Family Medicine

## 2011-07-07 NOTE — Telephone Encounter (Signed)
yes

## 2011-07-09 NOTE — Telephone Encounter (Signed)
Patient has appt on 8/27 at 3:15

## 2011-07-21 ENCOUNTER — Encounter: Payer: Self-pay | Admitting: Family Medicine

## 2011-07-21 ENCOUNTER — Other Ambulatory Visit: Payer: Self-pay | Admitting: Family Medicine

## 2011-07-21 ENCOUNTER — Ambulatory Visit (INDEPENDENT_AMBULATORY_CARE_PROVIDER_SITE_OTHER): Payer: Managed Care, Other (non HMO) | Admitting: Family Medicine

## 2011-07-21 DIAGNOSIS — R55 Syncope and collapse: Secondary | ICD-10-CM

## 2011-07-21 DIAGNOSIS — E876 Hypokalemia: Secondary | ICD-10-CM

## 2011-07-21 DIAGNOSIS — R569 Unspecified convulsions: Secondary | ICD-10-CM

## 2011-07-21 NOTE — Progress Notes (Signed)
  Subjective:    Patient ID: [name], female    DOB: 12-29-1970, 40 y.o.   MRN: [id number]  HPI Pt here f/u ER from episode of Chest pain and syncope  07/02/2011.  Pt was d/c'd and nothing was found.  Pt has had full cardiac w/u in past.   Sandford Craze stated pt seemed post ictal after event.  No more chest pain since that event---see er record and ov note from [facility].   Review of Systems As above    Objective:   Physical Exam  Constitutional: She is oriented to person, place, and time. She appears well-developed and well-nourished.  Cardiovascular: Normal rate and regular rhythm.   No murmur heard. Pulmonary/Chest: Effort normal and breath sounds normal. No respiratory distress. She has no wheezes. She has no rales. She exhibits no tenderness.  Musculoskeletal: She exhibits no edema and no tenderness.  Neurological: She is alert and oriented to person, place, and time.  Skin: Skin is warm.  Psychiatric: She has a normal mood and affect. Her behavior is normal. Judgment and thought content normal.          Assessment & Plan:  1.  Syncope---- previously dx as vasovagal but NP that witnessed event felt it may be postictal                          Refer to neuro for further eval   R/o seizures

## 2011-07-21 NOTE — Patient Instructions (Signed)
Serum Potassium Measurement   Potassium is an electrolyte that helps regulate the amount of fluid in the body. It also stimulates muscle contraction and maintains a stable acid-base balance.   Most of the body's potassium is inside of cells, and only a very small amount is in the blood. Because the amount in the blood is so small, minor changes can have big effects.   PREPARATION FOR TEST Testing for potassium requires taking a blood sample taken by needle from a vein in the arm. The skin is cleaned thoroughly before the sample is drawn. There is no other special preparation needed.   NORMAL VALUES:  Adults: 3.5-5 mEq/L (3.5-5 mmol/L)  Premature neonates, cord blood: 5-10.2 mEq/L (5-10.2 mmol/L)  Premature neonates, 48 hours: 3-6 mEq/L (3-6 mmol/L)  Neonates: 3.7-5.9 mEq/L (3.7-5.9 mmol/L)  Neonates, cord blood: 5.6-12 mEq/L (5.6-12 mmol/L)  Infants: 4.1-5.3 mEq/L (4.1-5.3 mmol/L)   Children: 3.4-4.7 mEq/L (3.4-4.7 mmol/L)   YOUR VALUE IS:    MEANING OF TEST Your caregiver will go over the test results with you and discuss the importance and meaning of your results, as well as treatment options and the need for additional tests if necessary.   OBTAINING THE TEST RESULTS It is your responsibility to obtain your test results.  Ask the lab or department performing the test when and how you will get your results.   **"Normal" ranges for lab values and other tests may vary among different laboratories and/or hospitals.  You should always check with your doctor after having lab work or other tests done to discuss the meaning of your test results and whether or not your values are considered "within normal limits".   Document Released: 11/10/2005  Document Re-Released: 10/23/2008 [facility] Patient Information 2011 [place], [place].

## 2011-07-22 ENCOUNTER — Other Ambulatory Visit: Payer: Self-pay

## 2011-07-22 LAB — BASIC METABOLIC PANEL
CO2: 28 mEq/L (ref 19–32)
Calcium: 9.3 mg/dL (ref 8.4–10.5)
Creatinine, Ser: 0.9 mg/dL (ref 0.4–1.2)
GFR: 87.05 mL/min (ref >60.00)
Sodium: 139 mEq/L (ref 135–145)

## 2011-07-26 ENCOUNTER — Other Ambulatory Visit: Payer: Managed Care, Other (non HMO)

## 2011-08-02 ENCOUNTER — Ambulatory Visit
Admission: RE | Admit: 2011-08-02 | Discharge: 2011-08-02 | Disposition: A | Discharge status: Home / Self Care | Payer: Managed Care, Other (non HMO) | Source: Ambulatory Visit | Attending: Family Medicine | Admitting: Family Medicine

## 2011-08-02 DIAGNOSIS — R55 Syncope and collapse: Secondary | ICD-10-CM

## 2011-08-02 MED ORDER — GADOBENATE DIMEGLUMINE 529 MG/ML IV SOLN: 18.0000 mL | Freq: Once | INTRAVENOUS | Status: DC | PRN

## 2011-08-02 MED ORDER — GADOBENATE DIMEGLUMINE 529 MG/ML IV SOLN
17.0000 mL | Freq: Once | INTRAVENOUS | Status: AC | PRN
  Administered 2011-08-02: 17 mL via INTRAVENOUS

## 2011-08-26 LAB — COMPREHENSIVE METABOLIC PANEL
Albumin: 3.4 — ABNORMAL LOW
Alkaline Phosphatase: 59
BUN: 4 — ABNORMAL LOW
CO2: 28
Chloride: 105
Creatinine, Ser: 0.84
GFR calc non Af Amer: >60
Glucose, Bld: 129 — ABNORMAL HIGH
Potassium: 3.5
Total Bilirubin: 0.7

## 2011-08-26 LAB — LIPID PANEL
HDL: 35 — ABNORMAL LOW
LDL Cholesterol: 70
Triglycerides: 46
VLDL: 9

## 2011-08-26 LAB — HOMOCYSTEINE: Homocysteine: 6.2

## 2011-08-26 LAB — CARDIAC PANEL(CRET KIN+CKTOT+MB+TROPI)
CK, MB: 0.7
Total CK: 115
Troponin I: <0.01

## 2011-08-26 LAB — HEMOGLOBIN A1C: Mean Plasma Glucose: 94

## 2011-08-26 LAB — RAPID URINE DRUG SCREEN, HOSP PERFORMED
Cocaine: NOT DETECTED
Opiates: POSITIVE — AB
Tetrahydrocannabinol: NOT DETECTED

## 2011-08-27 LAB — CBC
HCT: 38.8
MCV: 90.2
Platelets: 295
RDW: 12.6

## 2011-08-27 LAB — PROTIME-INR: Prothrombin Time: 14.8

## 2011-08-27 LAB — BASIC METABOLIC PANEL
BUN: 8
Chloride: 103
Creatinine, Ser: 0.8
GFR calc non Af Amer: >60
Glucose, Bld: 70
Potassium: 3.9

## 2011-09-02 LAB — CBC
HCT: 36.8
MCHC: 33.8
MCV: 87.6
Platelets: 372
RDW: 13.2

## 2011-09-02 LAB — BASIC METABOLIC PANEL
BUN: 6
CO2: 23
Calcium: 9.6
Chloride: 111
Creatinine, Ser: 0.84

## 2011-10-08 ENCOUNTER — Encounter: Payer: Self-pay | Admitting: Family Medicine

## 2011-10-09 ENCOUNTER — Ambulatory Visit (INDEPENDENT_AMBULATORY_CARE_PROVIDER_SITE_OTHER): Payer: Managed Care, Other (non HMO) | Admitting: Family Medicine

## 2011-10-09 ENCOUNTER — Encounter: Payer: Self-pay | Admitting: Family Medicine

## 2011-10-09 VITALS — BP 118/74 | HR 73 | Temp 97.0°F | Wt 183.0 lb

## 2011-10-09 DIAGNOSIS — E162 Hypoglycemia, unspecified: Secondary | ICD-10-CM

## 2011-10-09 DIAGNOSIS — B009 Herpesviral infection, unspecified: Secondary | ICD-10-CM

## 2011-10-09 DIAGNOSIS — I10 Essential (primary) hypertension: Secondary | ICD-10-CM

## 2011-10-09 DIAGNOSIS — E876 Hypokalemia: Secondary | ICD-10-CM

## 2011-10-09 MED ORDER — VALACYCLOVIR HCL 500 MG PO TABS: 500.0000 mg | ORAL_TABLET | Freq: Every day | ORAL | Status: DC | PRN

## 2011-10-09 MED ORDER — ALBUTEROL SULFATE HFA 108 (90 BASE) MCG/ACT IN AERS: 2.0000 | INHALATION_SPRAY | RESPIRATORY_TRACT | Status: DC | PRN

## 2011-10-09 MED ORDER — HYDROCHLOROTHIAZIDE 25 MG PO TABS: 25.0000 mg | ORAL_TABLET | Freq: Every day | ORAL | Status: DC

## 2011-10-09 MED ORDER — METOPROLOL TARTRATE 50 MG PO TABS: 50.0000 mg | ORAL_TABLET | Freq: Two times a day (BID) | ORAL | Status: DC

## 2011-10-09 MED ORDER — POTASSIUM CHLORIDE CRYS ER 20 MEQ PO TBCR: 20.0000 meq | EXTENDED_RELEASE_TABLET | Freq: Every day | ORAL | Status: DC

## 2011-10-09 NOTE — Progress Notes (Signed)
  Subjective:    Patient here for follow-up of elevated blood pressure.  She is not exercising and is adherent to a low-salt diet.  Blood pressure is well controlled at home. Cardiac symptoms: none. Patient denies: chest pain, dyspnea, fatigue, irregular heart beat, lower extremity edema, near-syncope, orthopnea, palpitations, paroxysmal nocturnal dyspnea, syncope and tachypnea. Cardiovascular risk factors: family history of premature cardiovascular disease, hypertension, obesity (BMI >= 30 kg/m2) and sedentary lifestyle. Use of agents associated with hypertension: none. History of target organ damage: none.  The following portions of the patient's history were reviewed and updated as appropriate: allergies, current medications, past family history, past medical history, past social history, past surgical history and problem list.  Review of Systems Pertinent items are noted in HPI.     Objective:    BP 118/74  Pulse 73  Temp(Src) 97 F (36.1 C) (Oral)  Wt 183 lb (83.008 kg)  SpO2 98% General appearance: alert, cooperative, appears stated age and no distress Neck: no adenopathy, no carotid bruit, no JVD, supple, symmetrical, trachea midline and thyroid not enlarged, symmetric, no tenderness/mass/nodules Lungs: clear to auscultation bilaterally Heart: S1, S2 normal and + murmur 1-2/6 Extremities: extremities normal, atraumatic, no cyanosis or edema    Assessment:    Hypertension, normal blood pressure . Evidence of target organ damage: none.  Hypokalemia---check labs today Plan:    Medication: no change. Dietary sodium restriction. Regular aerobic exercise. Check blood pressures 2-3 times weekly and record. Follow up: 6 months and as needed.

## 2011-10-09 NOTE — Patient Instructions (Signed)

## 2011-10-10 LAB — BASIC METABOLIC PANEL
BUN: 13 mg/dL (ref 6–23)
CO2: 29 mEq/L (ref 19–32)
Chloride: 102 mEq/L (ref 96–112)
Creatinine, Ser: 0.8 mg/dL (ref 0.4–1.2)
Glucose, Bld: 71 mg/dL (ref 70–99)
Potassium: 3.5 mEq/L (ref 3.5–5.1)

## 2011-10-13 ENCOUNTER — Ambulatory Visit: Payer: Managed Care, Other (non HMO) | Admitting: Family Medicine

## 2011-10-14 ENCOUNTER — Other Ambulatory Visit (HOSPITAL_BASED_OUTPATIENT_CLINIC_OR_DEPARTMENT_OTHER): Payer: Self-pay | Admitting: Obstetrics and Gynecology

## 2011-10-14 DIAGNOSIS — Z1231 Encounter for screening mammogram for malignant neoplasm of breast: Secondary | ICD-10-CM

## 2011-10-15 ENCOUNTER — Encounter: Payer: Self-pay | Admitting: *Deleted

## 2011-10-17 ENCOUNTER — Ambulatory Visit (HOSPITAL_BASED_OUTPATIENT_CLINIC_OR_DEPARTMENT_OTHER)
Admission: RE | Admit: 2011-10-17 | Discharge: 2011-10-17 | Disposition: A | Discharge status: Home / Self Care | Payer: Managed Care, Other (non HMO) | Source: Ambulatory Visit | Attending: Obstetrics and Gynecology | Admitting: Obstetrics and Gynecology

## 2011-10-17 DIAGNOSIS — Z1231 Encounter for screening mammogram for malignant neoplasm of breast: Secondary | ICD-10-CM | POA: Insufficient documentation

## 2012-02-16 ENCOUNTER — Ambulatory Visit (INDEPENDENT_AMBULATORY_CARE_PROVIDER_SITE_OTHER): Payer: Managed Care, Other (non HMO) | Admitting: Obstetrics and Gynecology

## 2012-02-16 DIAGNOSIS — Z01419 Encounter for gynecological examination (general) (routine) without abnormal findings: Secondary | ICD-10-CM

## 2012-02-16 DIAGNOSIS — R5381 Other malaise: Secondary | ICD-10-CM

## 2012-02-16 DIAGNOSIS — Z202 Contact with and (suspected) exposure to infections with a predominantly sexual mode of transmission: Secondary | ICD-10-CM

## 2012-02-25 ENCOUNTER — Other Ambulatory Visit: Payer: Self-pay

## 2012-02-25 DIAGNOSIS — N938 Other specified abnormal uterine and vaginal bleeding: Secondary | ICD-10-CM

## 2012-03-17 ENCOUNTER — Other Ambulatory Visit: Payer: Managed Care, Other (non HMO)

## 2012-03-17 ENCOUNTER — Ambulatory Visit (INDEPENDENT_AMBULATORY_CARE_PROVIDER_SITE_OTHER): Payer: Managed Care, Other (non HMO) | Admitting: Obstetrics and Gynecology

## 2012-03-17 ENCOUNTER — Ambulatory Visit (INDEPENDENT_AMBULATORY_CARE_PROVIDER_SITE_OTHER): Payer: Managed Care, Other (non HMO)

## 2012-03-17 ENCOUNTER — Encounter: Payer: Self-pay | Admitting: Obstetrics and Gynecology

## 2012-03-17 ENCOUNTER — Other Ambulatory Visit: Payer: Self-pay | Admitting: Obstetrics and Gynecology

## 2012-03-17 VITALS — BP 148/90 | Wt 180.0 lb

## 2012-03-17 DIAGNOSIS — N926 Irregular menstruation, unspecified: Secondary | ICD-10-CM

## 2012-03-17 DIAGNOSIS — N938 Other specified abnormal uterine and vaginal bleeding: Secondary | ICD-10-CM

## 2012-03-17 DIAGNOSIS — N939 Abnormal uterine and vaginal bleeding, unspecified: Secondary | ICD-10-CM

## 2012-03-17 DIAGNOSIS — D259 Leiomyoma of uterus, unspecified: Secondary | ICD-10-CM

## 2012-03-17 NOTE — Progress Notes (Signed)
Reason for consultation: Dysfunctional uterine bleeding and fibroids  History of present illness: This is a 41 year old African American female known to me for 2 previous myomectomies presents today for a sonohysterogram due to new onset of dysfunctional uterine bleeding.  A sonohysterogram confirmed the presence of 2 fibroids: A lower uterine segment 5 cm and 3 small fibroids between 1 and 1.5 cm in the left uterine myometrium.  Sonohysterogram reveals a thin lining with absence of endometrial polyp and a well-positioned IUD.  After discussing options with the patient, patient is requesting definitive treatment for her fibroid in the form of robotic assisted hysterectomy.  The procedure has been reviewed with the patient as well as possible complications including but not limited to bleeding, infection, injury to other organs, earlier menopause and possible pelvic prolapse.  She understands the risks and benefits and confirms that she does not want to preserve her fertility.  We will schedule robotic assisted hysterectomy and see the patient again for her preoperative visit.  Information was provided on robotic hysterectomy in the form of literature and video.  30 min. Face-to-face consultation

## 2012-03-17 NOTE — Patient Instructions (Signed)
Patient Education Materials to be provided at check out (*indicates is located in accordion folder):  *Da Vinci Hysterectomy  

## 2012-03-26 ENCOUNTER — Encounter: Payer: Self-pay | Admitting: Family Medicine

## 2012-03-26 ENCOUNTER — Ambulatory Visit (INDEPENDENT_AMBULATORY_CARE_PROVIDER_SITE_OTHER): Payer: Managed Care, Other (non HMO) | Admitting: Family Medicine

## 2012-03-26 VITALS — BP 118/70 | HR 82 | Temp 98.2°F | Ht 63.5 in | Wt 183.0 lb

## 2012-03-26 DIAGNOSIS — I10 Essential (primary) hypertension: Secondary | ICD-10-CM

## 2012-03-26 DIAGNOSIS — Z0000 Encounter for general adult medical examination without abnormal findings: Secondary | ICD-10-CM

## 2012-03-26 DIAGNOSIS — R319 Hematuria, unspecified: Secondary | ICD-10-CM

## 2012-03-26 LAB — CBC WITH DIFFERENTIAL/PLATELET
Basophils Absolute: 0 10*3/uL (ref 0.0–0.1)
Eosinophils Relative: 3.5 % (ref 0.0–5.0)
HCT: 38.8 % (ref 36.0–46.0)
Hemoglobin: 12.6 g/dL (ref 12.0–15.0)
Lymphs Abs: 1.9 10*3/uL (ref 0.7–4.0)
MCV: 90.6 fl (ref 78.0–100.0)
Monocytes Absolute: 0.4 10*3/uL (ref 0.1–1.0)
Monocytes Relative: 7.8 % (ref 3.0–12.0)
Neutro Abs: 2.6 10*3/uL (ref 1.4–7.7)
RDW: 14.4 % (ref 11.5–14.6)

## 2012-03-26 LAB — BASIC METABOLIC PANEL
CO2: 30 mEq/L (ref 19–32)
Chloride: 100 mEq/L (ref 96–112)
GFR: 88.98 mL/min (ref >60.00)
Glucose, Bld: 82 mg/dL (ref 70–99)
Potassium: 3 mEq/L — ABNORMAL LOW (ref 3.5–5.1)
Sodium: 138 mEq/L (ref 135–145)

## 2012-03-26 LAB — POCT URINALYSIS DIPSTICK
Glucose, UA: NEGATIVE
Ketones, UA: NEGATIVE
Spec Grav, UA: 1.01

## 2012-03-26 LAB — LIPID PANEL: Cholesterol: 127 mg/dL (ref 0–200)

## 2012-03-26 LAB — HEPATIC FUNCTION PANEL
ALT: 17 U/L (ref 0–35)
AST: 21 U/L (ref 0–37)
Albumin: 3.9 g/dL (ref 3.5–5.2)

## 2012-03-26 LAB — TSH: TSH: 0.55 u[IU]/mL (ref 0.35–5.50)

## 2012-03-26 MED ORDER — METOPROLOL TARTRATE 50 MG PO TABS: 50.0000 mg | ORAL_TABLET | Freq: Two times a day (BID) | ORAL | Status: DC

## 2012-03-26 NOTE — Assessment & Plan Note (Signed)
Stable con't meds 

## 2012-03-26 NOTE — Progress Notes (Signed)
  Subjective:     [name] is a 41 y.o. female and is here for a comprehensive physical exam. The patient reports no problems.  History   Social History  . Marital Status: Single    Spouse Name: N/A    Number of Children: N/A  . Years of Education: N/A   Occupational History  . Not on file.   Social History Main Topics  . Smoking status: Never Smoker   . Smokeless tobacco: Never Used  . Alcohol Use: No  . Drug Use: No  . Sexually Active: Yes -- Female partner(s)    Birth Control/ Protection: IUD   Other Topics Concern  . Not on file   Social History Narrative   Exercise---walk, zumba, [employer] ballet video   Health Maintenance  Topic Date Due  . Influenza Vaccine  08/24/2012  . Pap Smear  01/24/2014  . Tetanus/tdap  01/04/2018    The following portions of the patient's history were reviewed and updated as appropriate: allergies, current medications, past family history, past medical history, past social history, past surgical history and problem list.  Review of Systems Review of Systems  Constitutional: Negative for activity change, appetite change and fatigue.  HENT: Negative for hearing loss, congestion, tinnitus and ear discharge.  dentist--due Eyes: Negative for visual disturbance (see optho q2y -- vision corrected to 20/20 with glasses).  Respiratory: Negative for cough, chest tightness and shortness of breath.   Cardiovascular: Negative for chest pain, palpitations and leg swelling.  Gastrointestinal: Negative for abdominal pain, diarrhea, constipation and abdominal distention.  Genitourinary: Negative for urgency, frequency, decreased urine volume and difficulty urinating.  Musculoskeletal: Negative for back pain, arthralgias and gait problem.  Skin: Negative for color change, pallor and rash.  Neurological: Negative for dizziness, light-headedness, numbness and headaches.  Hematological: Negative for adenopathy. Does not bruise/bleed easily.    Psychiatric/Behavioral: Negative for suicidal ideas, confusion, sleep disturbance, self-injury, dysphoric mood, decreased concentration and agitation.       Objective:    BP 118/70  Pulse 82  Temp(Src) 98.2 F (36.8 C) (Oral)  Ht 5' 3.5" (1.613 m)  Wt 183 lb (83.008 kg)  BMI 31.91 kg/m2  SpO2 98%  LMP 01/23/2012 General appearance: alert, cooperative, appears stated age and no distress Head: Normocephalic, without obvious abnormality, atraumatic Eyes: conjunctivae/corneas clear. PERRL, EOM's intact. Fundi benign. Ears: normal TM's and external ear canals both ears Nose: Nares normal. Septum midline. Mucosa normal. No drainage or sinus tenderness. Throat: lips, mucosa, and tongue normal; teeth and gums normal Neck: no adenopathy, no carotid bruit, no JVD, supple, symmetrical, trachea midline and thyroid not enlarged, symmetric, no tenderness/mass/nodules Back: symmetric, no curvature. ROM normal. No CVA tenderness. Lungs: clear to auscultation bilaterally Breasts: gyn Heart: regular rate and rhythm, S1, S2 normal, no murmur, click, rub or gallop Abdomen: soft, non-tender; bowel sounds normal; no masses,  no organomegaly Pelvic: gyn Extremities: extremities normal, atraumatic, no cyanosis or edema Pulses: 2+ and symmetric Skin: Skin color, texture, turgor normal. No rashes or lesions Lymph nodes: Cervical, supraclavicular, and axillary nodes normal. Neurologic: Alert and oriented X 3, normal strength and tone. Normal symmetric reflexes. Normal coordination and gait psych---no depression, anxiety    Assessment:    Healthy female exam.      Plan:  ghm utd Check labs   See After Visit Summary for Counseling Recommendations

## 2012-03-26 NOTE — Patient Instructions (Signed)
Preventive Care for Adults, Female A healthy lifestyle and preventive care can promote health and wellness. Preventive health guidelines for women include the following key practices.  A routine yearly physical is a good way to check with your caregiver about your health and preventive screening. It is a chance to share any concerns and updates on your health, and to receive a thorough exam.   Visit your dentist for a routine exam and preventive care every 6 months. Brush your teeth twice a day and floss once a day. Good oral hygiene prevents tooth decay and gum disease.   The frequency of eye exams is based on your age, health, family medical history, use of contact lenses, and other factors. Follow your caregiver's recommendations for frequency of eye exams.   Eat a healthy diet. Foods like vegetables, fruits, whole grains, low-fat dairy products, and lean protein foods contain the nutrients you need without too many calories. Decrease your intake of foods high in solid fats, added sugars, and salt. Eat the right amount of calories for you.Get information about a proper diet from your caregiver, if necessary.   Regular physical exercise is one of the most important things you can do for your health. Most adults should get at least 150 minutes of moderate-intensity exercise (any activity that increases your heart rate and causes you to sweat) each week. In addition, most adults need muscle-strengthening exercises on 2 or more days a week.   Maintain a healthy weight. The body mass index (BMI) is a screening tool to identify possible weight problems. It provides an estimate of body fat based on height and weight. Your caregiver can help determine your BMI, and can help you achieve or maintain a healthy weight.For adults 20 years and older:   A BMI below 18.5 is considered underweight.   A BMI of 18.5 to 24.9 is normal.   A BMI of 25 to 29.9 is considered overweight.   A BMI of 30 and above is  considered obese.   Maintain normal blood lipids and cholesterol levels by exercising and minimizing your intake of saturated fat. Eat a balanced diet with plenty of fruit and vegetables. Blood tests for lipids and cholesterol should begin at age 20 and be repeated every 5 years. If your lipid or cholesterol levels are high, you are over 50, or you are at high risk for heart disease, you may need your cholesterol levels checked more frequently.Ongoing high lipid and cholesterol levels should be treated with medicines if diet and exercise are not effective.   If you smoke, find out from your caregiver how to quit. If you do not use tobacco, do not start.   If you are pregnant, do not drink alcohol. If you are breastfeeding, be very cautious about drinking alcohol. If you are not pregnant and choose to drink alcohol, do not exceed 1 drink per day. One drink is considered to be 12 ounces (355 mL) of beer, 5 ounces (148 mL) of wine, or 1.5 ounces (44 mL) of liquor.   Avoid use of street drugs. Do not share needles with anyone. Ask for help if you need support or instructions about stopping the use of drugs.   High blood pressure causes heart disease and increases the risk of stroke. Your blood pressure should be checked at least every 1 to 2 years. Ongoing high blood pressure should be treated with medicines if weight loss and exercise are not effective.   If you are 55 to 41   years old, ask your caregiver if you should take aspirin to prevent strokes.   Diabetes screening involves taking a blood sample to check your fasting blood sugar level. This should be done once every 3 years, after age 45, if you are within normal weight and without risk factors for diabetes. Testing should be considered at a younger age or be carried out more frequently if you are overweight and have at least 1 risk factor for diabetes.   Breast cancer screening is essential preventive care for women. You should practice "breast  self-awareness." This means understanding the normal appearance and feel of your breasts and may include breast self-examination. Any changes detected, no matter how small, should be reported to a caregiver. Women in their 20s and 30s should have a clinical breast exam (CBE) by a caregiver as part of a regular health exam every 1 to 3 years. After age 40, women should have a CBE every year. Starting at age 40, women should consider having a mammography (breast X-ray test) every year. Women who have a family history of breast cancer should talk to their caregiver about genetic screening. Women at a high risk of breast cancer should talk to their caregivers about having magnetic resonance imaging (MRI) and a mammography every year.   The Pap test is a screening test for cervical cancer. A Pap test can show cell changes on the cervix that might become cervical cancer if left untreated. A Pap test is a procedure in which cells are obtained and examined from the lower end of the uterus (cervix).   Women should have a Pap test starting at age 21.   Between ages 21 and 29, Pap tests should be repeated every 2 years.   Beginning at age 30, you should have a Pap test every 3 years as long as the past 3 Pap tests have been normal.   Some women have medical problems that increase the chance of getting cervical cancer. Talk to your caregiver about these problems. It is especially important to talk to your caregiver if a new problem develops soon after your last Pap test. In these cases, your caregiver may recommend more frequent screening and Pap tests.   The above recommendations are the same for women who have or have not gotten the vaccine for human papillomavirus (HPV).   If you had a hysterectomy for a problem that was not cancer or a condition that could lead to cancer, then you no longer need Pap tests. Even if you no longer need a Pap test, a regular exam is a good idea to make sure no other problems are  starting.   If you are between ages 65 and 70, and you have had normal Pap tests going back 10 years, you no longer need Pap tests. Even if you no longer need a Pap test, a regular exam is a good idea to make sure no other problems are starting.   If you have had past treatment for cervical cancer or a condition that could lead to cancer, you need Pap tests and screening for cancer for at least 20 years after your treatment.   If Pap tests have been discontinued, risk factors (such as a new sexual partner) need to be reassessed to determine if screening should be resumed.   The HPV test is an additional test that may be used for cervical cancer screening. The HPV test looks for the virus that can cause the cell changes on the cervix.   The cells collected during the Pap test can be tested for HPV. The HPV test could be used to screen women aged 30 years and older, and should be used in women of any age who have unclear Pap test results. After the age of 30, women should have HPV testing at the same frequency as a Pap test.   Colorectal cancer can be detected and often prevented. Most routine colorectal cancer screening begins at the age of 50 and continues through age 75. However, your caregiver may recommend screening at an earlier age if you have risk factors for colon cancer. On a yearly basis, your caregiver may provide home test kits to check for hidden blood in the stool. Use of a small camera at the end of a tube, to directly examine the colon (sigmoidoscopy or colonoscopy), can detect the earliest forms of colorectal cancer. Talk to your caregiver about this at age 50, when routine screening begins. Direct examination of the colon should be repeated every 5 to 10 years through age 75, unless early forms of pre-cancerous polyps or small growths are found.   Hepatitis C blood testing is recommended for all people born from 1945 through 1965 and any individual with known risks for hepatitis C.    Practice safe sex. Use condoms and avoid high-risk sexual practices to reduce the spread of sexually transmitted infections (STIs). STIs include gonorrhea, chlamydia, syphilis, trichomonas, herpes, HPV, and human immunodeficiency virus (HIV). Herpes, HIV, and HPV are viral illnesses that have no cure. They can result in disability, cancer, and death. Sexually active women aged 25 and younger should be checked for chlamydia. Older women with new or multiple partners should also be tested for chlamydia. Testing for other STIs is recommended if you are sexually active and at increased risk.   Osteoporosis is a disease in which the bones lose minerals and strength with aging. This can result in serious bone fractures. The risk of osteoporosis can be identified using a bone density scan. Women ages 65 and over and women at risk for fractures or osteoporosis should discuss screening with their caregivers. Ask your caregiver whether you should take a calcium supplement or vitamin D to reduce the rate of osteoporosis.   Menopause can be associated with physical symptoms and risks. Hormone replacement therapy is available to decrease symptoms and risks. You should talk to your caregiver about whether hormone replacement therapy is right for you.   Use sunscreen with sun protection factor (SPF) of 30 or more. Apply sunscreen liberally and repeatedly throughout the day. You should seek shade when your shadow is shorter than you. Protect yourself by wearing long sleeves, pants, a wide-brimmed hat, and sunglasses year round, whenever you are outdoors.   Once a month, do a whole body skin exam, using a mirror to look at the skin on your back. Notify your caregiver of new moles, moles that have irregular borders, moles that are larger than a pencil eraser, or moles that have changed in shape or color.   Stay current with required immunizations.   Influenza. You need a dose every fall (or winter). The composition of  the flu vaccine changes each year, so being vaccinated once is not enough.   Pneumococcal polysaccharide. You need 1 to 2 doses if you smoke cigarettes or if you have certain chronic medical conditions. You need 1 dose at age 65 (or older) if you have never been vaccinated.   Tetanus, diphtheria, pertussis (Tdap, Td). Get 1 dose of   Tdap vaccine if you are younger than age 65, are over 65 and have contact with an infant, are a healthcare worker, are pregnant, or simply want to be protected from whooping cough. After that, you need a Td booster dose every 10 years. Consult your caregiver if you have not had at least 3 tetanus and diphtheria-containing shots sometime in your life or have a deep or dirty wound.   HPV. You need this vaccine if you are a woman age 26 or younger. The vaccine is given in 3 doses over 6 months.   Measles, mumps, rubella (MMR). You need at least 1 dose of MMR if you were born in 1957 or later. You may also need a second dose.   Meningococcal. If you are age 19 to 21 and a first-year college student living in a residence hall, or have one of several medical conditions, you need to get vaccinated against meningococcal disease. You may also need additional booster doses.   Zoster (shingles). If you are age 60 or older, you should get this vaccine.   Varicella (chickenpox). If you have never had chickenpox or you were vaccinated but received only 1 dose, talk to your caregiver to find out if you need this vaccine.   Hepatitis A. You need this vaccine if you have a specific risk factor for hepatitis A virus infection or you simply wish to be protected from this disease. The vaccine is usually given as 2 doses, 6 to 18 months apart.   Hepatitis B. You need this vaccine if you have a specific risk factor for hepatitis B virus infection or you simply wish to be protected from this disease. The vaccine is given in 3 doses, usually over 6 months.  Preventive Services /  Frequency Ages 19 to 39  Blood pressure check.** / Every 1 to 2 years.   Lipid and cholesterol check.** / Every 5 years beginning at age 20.   Clinical breast exam.** / Every 3 years for women in their 20s and 30s.   Pap test.** / Every 2 years from ages 21 through 29. Every 3 years starting at age 30 through age 65 or 70 with a history of 3 consecutive normal Pap tests.   HPV screening.** / Every 3 years from ages 30 through ages 65 to 70 with a history of 3 consecutive normal Pap tests.   Hepatitis C blood test.** / For any individual with known risks for hepatitis C.   Skin self-exam. / Monthly.   Influenza immunization.** / Every year.   Pneumococcal polysaccharide immunization.** / 1 to 2 doses if you smoke cigarettes or if you have certain chronic medical conditions.   Tetanus, diphtheria, pertussis (Tdap, Td) immunization. / A one-time dose of Tdap vaccine. After that, you need a Td booster dose every 10 years.   HPV immunization. / 3 doses over 6 months, if you are 26 and younger.   Measles, mumps, rubella (MMR) immunization. / You need at least 1 dose of MMR if you were born in 1957 or later. You may also need a second dose.   Meningococcal immunization. / 1 dose if you are age 19 to 21 and a first-year college student living in a residence hall, or have one of several medical conditions, you need to get vaccinated against meningococcal disease. You may also need additional booster doses.   Varicella immunization.** / Consult your caregiver.   Hepatitis A immunization.** / Consult your caregiver. 2 doses, 6 to 18 months   apart.   Hepatitis B immunization.** / Consult your caregiver. 3 doses usually over 6 months.  Ages 40 to 64  Blood pressure check.** / Every 1 to 2 years.   Lipid and cholesterol check.** / Every 5 years beginning at age 20.   Clinical breast exam.** / Every year after age 40.   Mammogram.** / Every year beginning at age 40 and continuing for as  long as you are in good health. Consult with your caregiver.   Pap test.** / Every 3 years starting at age 30 through age 65 or 70 with a history of 3 consecutive normal Pap tests.   HPV screening.** / Every 3 years from ages 30 through ages 65 to 70 with a history of 3 consecutive normal Pap tests.   Fecal occult blood test (FOBT) of stool. / Every year beginning at age 50 and continuing until age 75. You may not need to do this test if you get a colonoscopy every 10 years.   Flexible sigmoidoscopy or colonoscopy.** / Every 5 years for a flexible sigmoidoscopy or every 10 years for a colonoscopy beginning at age 50 and continuing until age 75.   Hepatitis C blood test.** / For all people born from 1945 through 1965 and any individual with known risks for hepatitis C.   Skin self-exam. / Monthly.   Influenza immunization.** / Every year.   Pneumococcal polysaccharide immunization.** / 1 to 2 doses if you smoke cigarettes or if you have certain chronic medical conditions.   Tetanus, diphtheria, pertussis (Tdap, Td) immunization.** / A one-time dose of Tdap vaccine. After that, you need a Td booster dose every 10 years.   Measles, mumps, rubella (MMR) immunization. / You need at least 1 dose of MMR if you were born in 1957 or later. You may also need a second dose.   Varicella immunization.** / Consult your caregiver.   Meningococcal immunization.** / Consult your caregiver.   Hepatitis A immunization.** / Consult your caregiver. 2 doses, 6 to 18 months apart.   Hepatitis B immunization.** / Consult your caregiver. 3 doses, usually over 6 months.  Ages 65 and over  Blood pressure check.** / Every 1 to 2 years.   Lipid and cholesterol check.** / Every 5 years beginning at age 20.   Clinical breast exam.** / Every year after age 40.   Mammogram.** / Every year beginning at age 40 and continuing for as long as you are in good health. Consult with your caregiver.   Pap test.** /  Every 3 years starting at age 30 through age 65 or 70 with a 3 consecutive normal Pap tests. Testing can be stopped between 65 and 70 with 3 consecutive normal Pap tests and no abnormal Pap or HPV tests in the past 10 years.   HPV screening.** / Every 3 years from ages 30 through ages 65 or 70 with a history of 3 consecutive normal Pap tests. Testing can be stopped between 65 and 70 with 3 consecutive normal Pap tests and no abnormal Pap or HPV tests in the past 10 years.   Fecal occult blood test (FOBT) of stool. / Every year beginning at age 50 and continuing until age 75. You may not need to do this test if you get a colonoscopy every 10 years.   Flexible sigmoidoscopy or colonoscopy.** / Every 5 years for a flexible sigmoidoscopy or every 10 years for a colonoscopy beginning at age 50 and continuing until age 75.   Hepatitis   C blood test.** / For all people born from 1945 through 1965 and any individual with known risks for hepatitis C.   Osteoporosis screening.** / A one-time screening for women ages 65 and over and women at risk for fractures or osteoporosis.   Skin self-exam. / Monthly.   Influenza immunization.** / Every year.   Pneumococcal polysaccharide immunization.** / 1 dose at age 65 (or older) if you have never been vaccinated.   Tetanus, diphtheria, pertussis (Tdap, Td) immunization. / A one-time dose of Tdap vaccine if you are over 65 and have contact with an infant, are a healthcare worker, or simply want to be protected from whooping cough. After that, you need a Td booster dose every 10 years.   Varicella immunization.** / Consult your caregiver.   Meningococcal immunization.** / Consult your caregiver.   Hepatitis A immunization.** / Consult your caregiver. 2 doses, 6 to 18 months apart.   Hepatitis B immunization.** / Check with your caregiver. 3 doses, usually over 6 months.  ** Family history and personal history of risk and conditions may change your caregiver's  recommendations. Document Released: 01/06/2002 Document Revised: 10/30/2011 Document Reviewed: 04/07/2011 Exit[employer] Patient Information 2012 [employer]. 

## 2012-03-28 LAB — URINE CULTURE: Colony Count: 25000

## 2012-03-29 LAB — MEASLES/MUMPS/RUBELLA IMMUNITY: Rubella: 33.6 IU/mL — ABNORMAL HIGH

## 2012-03-31 ENCOUNTER — Other Ambulatory Visit: Payer: Self-pay | Admitting: Obstetrics and Gynecology

## 2012-04-05 ENCOUNTER — Telehealth: Payer: Self-pay

## 2012-04-05 NOTE — Telephone Encounter (Signed)
Increased cramping and bloating.  Sx sch for June.  Ibuprofen does help.  Just wanted to make sure that the fibroids were the reason.  Told pt that it is most likely the cause of her pain and bloating is from the fibroids because they constantly grow and take up space.  Pt to call if needs something else for pain. Pt agreeable.  ld

## 2012-04-05 NOTE — Telephone Encounter (Signed)
LMTC @ 1:40 ld.  

## 2012-04-13 ENCOUNTER — Telehealth: Payer: Self-pay | Admitting: Obstetrics and Gynecology

## 2012-04-13 NOTE — Telephone Encounter (Signed)
Robotic Hysterectomy scheduled for 05/19/12 @ 7:30 with SR.  [employer] Effective 11/25/11  Plan pays 80/20 after a $1,600 deductible, patient also has a health fund with $845.41, plan asked to file before collecting from patient.  Adrianne Pridgen

## 2012-05-04 ENCOUNTER — Encounter (HOSPITAL_COMMUNITY): Payer: Self-pay | Admitting: Pharmacist

## 2012-05-04 ENCOUNTER — Ambulatory Visit (INDEPENDENT_AMBULATORY_CARE_PROVIDER_SITE_OTHER): Payer: Managed Care, Other (non HMO) | Admitting: Obstetrics and Gynecology

## 2012-05-04 ENCOUNTER — Encounter: Payer: Managed Care, Other (non HMO) | Admitting: Obstetrics and Gynecology

## 2012-05-04 ENCOUNTER — Encounter: Payer: Self-pay | Admitting: Obstetrics and Gynecology

## 2012-05-04 VITALS — BP 110/70 | HR 80 | Temp 98.5°F | Resp 14 | Ht 63.0 in | Wt 187.0 lb

## 2012-05-04 DIAGNOSIS — K219 Gastro-esophageal reflux disease without esophagitis: Secondary | ICD-10-CM | POA: Insufficient documentation

## 2012-05-04 DIAGNOSIS — H9190 Unspecified hearing loss, unspecified ear: Secondary | ICD-10-CM

## 2012-05-04 DIAGNOSIS — Z01818 Encounter for other preprocedural examination: Secondary | ICD-10-CM

## 2012-05-04 DIAGNOSIS — I10 Essential (primary) hypertension: Secondary | ICD-10-CM

## 2012-05-04 DIAGNOSIS — A6000 Herpesviral infection of urogenital system, unspecified: Secondary | ICD-10-CM | POA: Insufficient documentation

## 2012-05-04 DIAGNOSIS — Z9889 Other specified postprocedural states: Secondary | ICD-10-CM

## 2012-05-04 NOTE — Progress Notes (Signed)
[name] is a 41 y.o. female G1P1 who presents for robot assisted hysterectomy because of dysfunctional uterine bleeding and uterine fibroids. This patient is S/P myomectomy x 2 who developed increasing irregular menses in November 2012.  She began to bleed twice a month for a total of 14 days with the need to change a pad with a tampon 4 times a day.  She admits to some cramping that had not previously been present since her Mirena insertion in 11/2007.  She was placed on oral contraceptives, in addition to having a Mirena IUD but her symptoms persisted.  She denies urinary tract or vaginitis symptoms, dyspareunia or changes in her bowel function.  A sonohysterogram performed in April 2013 demonstrated uterus 10.1 x 7.14 x 5.59 cm with a left anterior fibroid 5.3 x 4.8 x 4.1 cm and # 3, 3-3.5 cm fibroids in the left myometrium;  IUD was properly placed within endometrial cavity;  left ovary-3.74 x 2.1 x 2.0 cm, right ovary 3.8 x 3.13 x 2.96 cm containing a dominant follicle 3.1 x 2.1 x 2.0 cm. Patient's TSH was normal at that same time and gonorrhea and chlamydia cultures were negative.  Though patient is aware of the various medical and surgical management options for her bleeding  and fibroid issues, she has chosen to have definitive therapy in the form of hysterectomy though she would like to preserve her cervix.   Past Medical History  OB History: G1P1 SVD,  1997   GYN History: menarche 41 YO ; LMP see HPI;  Contraception-Mirena IUD; Has a history of  HSV-2 and CIN-I; Most recent  mammogram was normal ( 09/2011) and   PAP smear-normal (01/2012)  Medical History: asthma, fibroids, hypertension, renal stones, depression, congenital left ear deafness, migraines, left breast ductal papilloma  Surgical History: 2003 Diagnostic Laparoscopy for pelvic pain, ovarian cystectomy and clinical findings of endometriosis though not demonstrated by pathology report;  2004 Myomectomy;  2005 Left Breast   Biopsy-benign   2010 Laparoscopic right ovarian cystectomies (# 3);  2010 Robot assisted myomectomy with lysis of adhesions;  2012 Left Breast Needle Biopsy-benign Denies problems with anesthesia.  Does not receive blood products due to [employer] faith   Family History: heart disease, hypertension, thyroid disease, lung cancer, breast cancer (maternal grand-mother after menopause), substance abuse.  Social History:  Patient is single, employed with [employer]; Denies alcohol, tobacco or illicit drug use  Outpatient Encounter Prescriptions as of 05/04/2012 Medication Sig Dispense Refill . albuterol (PROVENTIL HFA) 108 (90 BASE) MCG/ACT inhaler Inhale 2 puffs into the lungs every 4 (four) hours as needed. Shortness of breath and wheezing  1 Inhaler  1 . Cholecalciferol (VITAMIN D) 1000 UNITS capsule Take 2,000 Units by mouth daily.      . hydrochlorothiazide (HYDRODIURIL) 25 MG tablet Take 1 tablet (25 mg total) by mouth daily.  90 tablet  3 . levonorgestrel (MIRENA) 20 MCG/24HR IUD 1 each by Intrauterine route once.     . metoprolol (LOPRESSOR) 50 MG tablet Take 1 tablet (50 mg total) by mouth 2 (two) times daily.  180 tablet  3 . Multiple Vitamins-Calcium (ONE-A-DAY WOMENS PO) Take 1 tablet by mouth daily.     . potassium chloride SA (K-DUR,KLOR-CON) 20 MEQ tablet Take 40 mEq by mouth daily.     . valACYclovir (VALTREX) 500 MG tablet Take 1 tablet (500 mg total) by mouth daily as needed. outbreak  90 tablet  1 . DISCONTD: potassium chloride SA (K-DUR,KLOR-CON) 20   MEQ tablet Take 1 tablet (20 mEq total) by mouth daily.  90 tablet  3   No Known Allergies Denies sensitivity to latex, shellfish, soy, peanuts or adhesives  ROS:  denies frequent headachse, vision changes, dysphagia, tinnitus, dizziness,  chest pain, shortness of breath (except with asthma) , nausea, vomiting, diarrhea, dysuria, hematuria, pelvic pain, swelling of joints,easy bruising,  myalgias, arthralgias, skin  rashes and except as is mentioned in the history of present illness, patient's review of systems is otherwise negative   Physical Exam    BP 110/70  Pulse 80  Temp(Src) 98.5 F (36.9 C) (Oral)  Resp 14  Ht 5' 3" (1.6 m)  Wt 187 lb (84.823 kg)  BMI 33.13 kg/m2  Neck: supple without masses Lungs: clear to auscultation Heart: regular rate and rhythm Abdomen: soft except firm palpable mass from pelvis to 2 fingers above symphysis pubis that is tender; no guarding or rebound Pelvic:EGBUS-wnl, vagina-normal rugae, cervix-normal; uterus-irregular, tender, slightly mobile, 14-16 weeks size; adnexae-obscured by mass effect of uterus Extremities:  no clubbing, cyanosis or edema   Assesment: Dysfunctional Uterine Bleeding                     Uterine Fibroids  Disposition:  A discussion was held with patient regarding the indication for her procedure(s) along with the risks, which include but are not limited to: reaction to anesthesia, damage to adjacent organs, infection, excessive bleeding, early menopause, pelvic prolapse, bleeding from the cervix and possible fibroid formation on the cervix.  Patient was given the Miralax bowel prep to be completed 24 hours prior to her procedure.  The patient verbalized understanding of these risks and preoperative instructions and wants to proceed with a robot assisted laparoscopic supracervical hysterectomy at [facility] May 19, 2012 at 11 a.m.   CSN# [id number]   Jann Milkovich J. Latrelle Bazar, PA-C  for Dr. Rivard   

## 2012-05-04 NOTE — H&P (Signed)
[name] is a 40 y.o. female G1P1 who presents for robot assisted hysterectomy because of dysfunctional uterine bleeding and uterine fibroids. This patient is S/P myomectomy x 2 who developed increasing irregular menses in November 2012.  She began to bleed twice a month for a total of 14 days with the need to change a pad with a tampon 4 times a day.  She admits to some cramping that had not previously been present since her Mirena insertion in 11/2007.  She was placed on oral contraceptives, in addition to having a Mirena IUD but her symptoms persisted.  She denies urinary tract or vaginitis symptoms, dyspareunia or changes in her bowel function.  A sonohysterogram performed in April 2013 demonstrated uterus 10.1 x 7.14 x 5.59 cm with a left anterior fibroid 5.3 x 4.8 x 4.1 cm and # 3, 3-3.5 cm fibroids in the left myometrium;  IUD was properly placed within endometrial cavity;  left ovary-3.74 x 2.1 x 2.0 cm, right ovary 3.8 x 3.13 x 2.96 cm containing a dominant follicle 3.1 x 2.1 x 2.0 cm. Patient's TSH was normal at that same time and gonorrhea and chlamydia cultures were negative.  Though patient is aware of the various medical and surgical management options for her bleeding  and fibroid issues, she has chosen to have definitive therapy in the form of hysterectomy though she would like to preserve her cervix.   Past Medical History  OB History: G1P1 SVD,  1997   GYN History: menarche 41 YO ; LMP see HPI;  Contraception-Mirena IUD; Has a history of  HSV-2 and CIN-I; Most recent  mammogram was normal ( 09/2011) and   PAP smear-normal (01/2012)  Medical History: asthma, fibroids, hypertension, renal stones, depression, congenital left ear deafness, migraines, left breast ductal papilloma  Surgical History: 2003 Diagnostic Laparoscopy for pelvic pain, ovarian cystectomy and clinical findings of endometriosis though not demonstrated by pathology report;  2004 Myomectomy;  2005 Left Breast   Biopsy-benign   2010 Laparoscopic right ovarian cystectomies (# 3);  2010 Robot assisted myomectomy with lysis of adhesions;  2012 Left Breast Needle Biopsy-benign Denies problems with anesthesia.  Does not receive blood products due to Jehovah's Witness faith   Family History: heart disease, hypertension, thyroid disease, lung cancer, breast cancer (maternal grand-mother after menopause), substance abuse.  Social History:  Patient is single, employed with [employer]; Denies alcohol, tobacco or illicit drug use  Outpatient Encounter Prescriptions as of 05/04/2012 Medication Sig Dispense Refill . albuterol (PROVENTIL HFA) 108 (90 BASE) MCG/ACT inhaler Inhale 2 puffs into the lungs every 4 (four) hours as needed. Shortness of breath and wheezing  1 Inhaler  1 . Cholecalciferol (VITAMIN D) 1000 UNITS capsule Take 2,000 Units by mouth daily.      . hydrochlorothiazide (HYDRODIURIL) 25 MG tablet Take 1 tablet (25 mg total) by mouth daily.  90 tablet  3 . levonorgestrel (MIRENA) 20 MCG/24HR IUD 1 each by Intrauterine route once.     . metoprolol (LOPRESSOR) 50 MG tablet Take 1 tablet (50 mg total) by mouth 2 (two) times daily.  180 tablet  3 . Multiple Vitamins-Calcium (ONE-A-DAY WOMENS PO) Take 1 tablet by mouth daily.     . potassium chloride SA (K-DUR,KLOR-CON) 20 MEQ tablet Take 40 mEq by mouth daily.     . valACYclovir (VALTREX) 500 MG tablet Take 1 tablet (500 mg total) by mouth daily as needed. outbreak  90 tablet  1 . DISCONTD: potassium chloride SA (K-DUR,KLOR-CON) 20   MEQ tablet Take 1 tablet (20 mEq total) by mouth daily.  90 tablet  3   No Known Allergies Denies sensitivity to latex, shellfish, soy, peanuts or adhesives  ROS:  denies frequent headachse, vision changes, dysphagia, tinnitus, dizziness,  chest pain, shortness of breath (except with asthma) , nausea, vomiting, diarrhea, dysuria, hematuria, pelvic pain, swelling of joints,easy bruising,  myalgias, arthralgias, skin  rashes and except as is mentioned in the history of present illness, patient's review of systems is otherwise negative   Physical Exam    BP 110/70  Pulse 80  Temp(Src) 98.5 F (36.9 C) (Oral)  Resp 14  Ht 5' 3" (1.6 m)  Wt 187 lb (84.823 kg)  BMI 33.13 kg/m2  Neck: supple without masses Lungs: clear to auscultation Heart: regular rate and rhythm Abdomen: soft except firm palpable mass from pelvis to 2 fingers above symphysis pubis that is tender; no guarding or rebound Pelvic:EGBUS-wnl, vagina-normal rugae, cervix-normal; uterus-irregular, tender, slightly mobile, 14-16 weeks size; adnexae-obscured by mass effect of uterus Extremities:  no clubbing, cyanosis or edema   Assesment: Dysfunctional Uterine Bleeding                     Uterine Fibroids  Disposition:  A discussion was held with patient regarding the indication for her procedure(s) along with the risks, which include but are not limited to: reaction to anesthesia, damage to adjacent organs, infection, excessive bleeding, early menopause, pelvic prolapse, bleeding from the cervix and possible fibroid formation on the cervix.  Patient was given the Miralax bowel prep to be completed 24 hours prior to her procedure.  The patient verbalized understanding of these risks and preoperative instructions and wants to proceed with a robot assisted laparoscopic supracervical hysterectomy at [facility] May 19, 2012 at 11 a.m.   CSN# [id number]   Avnoor Koury J. Shahrukh Pasch, PA-C  for Dr. Rivard   

## 2012-05-13 ENCOUNTER — Encounter (HOSPITAL_COMMUNITY): Payer: Self-pay

## 2012-05-13 ENCOUNTER — Encounter (HOSPITAL_COMMUNITY)
Admission: RE | Admit: 2012-05-13 | Discharge: 2012-05-13 | Disposition: A | Discharge status: Home / Self Care | Payer: Managed Care, Other (non HMO) | Source: Ambulatory Visit | Attending: Obstetrics and Gynecology | Admitting: Obstetrics and Gynecology

## 2012-05-13 HISTORY — DX: Cardiac murmur, unspecified: R01.1

## 2012-05-13 LAB — DIFFERENTIAL
Basophils Absolute: 0 10*3/uL (ref 0.0–0.1)
Basophils Relative: 1 % (ref 0–1)
Lymphocytes Relative: 36 % (ref 12–46)
Monocytes Absolute: 0.5 10*3/uL (ref 0.1–1.0)
Neutro Abs: 3.6 10*3/uL (ref 1.7–7.7)
Neutrophils Relative %: 53 % (ref 43–77)

## 2012-05-13 LAB — CBC
HCT: 36.5 % (ref 36.0–46.0)
Hemoglobin: 12.1 g/dL (ref 12.0–15.0)
MCHC: 33.2 g/dL (ref 30.0–36.0)
RDW: 13.9 % (ref 11.5–15.5)
WBC: 6.7 10*3/uL (ref 4.0–10.5)

## 2012-05-13 LAB — BASIC METABOLIC PANEL
CO2: 28 mEq/L (ref 19–32)
Chloride: 99 mEq/L (ref 96–112)
GFR calc Af Amer: >90 mL/min (ref >90)
Potassium: 3.3 mEq/L — ABNORMAL LOW (ref 3.5–5.1)

## 2012-05-13 LAB — SURGICAL PCR SCREEN
MRSA, PCR: NEGATIVE
Staphylococcus aureus: NEGATIVE

## 2012-05-13 NOTE — Patient Instructions (Addendum)
   Your procedure is scheduled on: Wednesday June 26th  Enter through the [employer] of [facility] at: [employer] up the phone at the desk and dial [phone] and inform us of your arrival.  Please call this number if you have any problems the morning of surgery: [phone]  Remember: Do not eat food after midnight:Tuesday Do not drink clear liquids after: midnight Tuesday Take these medicines the morning of surgery with a SIP OF WATER: hctz, lopressor, potassium, and bring albuterol inhaler with you day of surgery  Do not wear jewelry, make-up, or FINGER nail polish Do not wear lotions, powders, perfumes or deodorant. Do not shave 48 hours prior to surgery. Do not bring valuables to the hospital. Contacts, dentures or bridgework may not be worn into surgery.  Leave suitcase in the car. After Surgery it may be brought to your room. For patients being admitted to the hospital, checkout time is 11:00am the day of discharge.  Patients discharged on the day of surgery will not be allowed to drive home.     Remember to use your hibiclens as instructed.Please shower with 1/2 bottle the evening before your surgery and the other 1/2 bottle the morning of surgery. Neck down avoiding private area.

## 2012-05-17 ENCOUNTER — Other Ambulatory Visit: Payer: Self-pay | Admitting: Obstetrics and Gynecology

## 2012-05-18 MED ORDER — DEXTROSE 5 % IV SOLN
2.0000 g | INTRAVENOUS | Status: AC
  Administered 2012-05-19: 2 g via INTRAVENOUS
  Filled 2012-05-18: qty 2

## 2012-05-19 ENCOUNTER — Encounter (HOSPITAL_COMMUNITY)
Admission: RE | Disposition: A | Discharge status: Home / Self Care | Payer: Self-pay | Source: Ambulatory Visit | Attending: Obstetrics and Gynecology

## 2012-05-19 ENCOUNTER — Ambulatory Visit (HOSPITAL_COMMUNITY): Payer: Managed Care, Other (non HMO) | Admitting: Anesthesiology

## 2012-05-19 ENCOUNTER — Encounter (HOSPITAL_COMMUNITY): Payer: Self-pay | Admitting: Anesthesiology

## 2012-05-19 ENCOUNTER — Ambulatory Visit (HOSPITAL_COMMUNITY)
Admission: RE | Admit: 2012-05-19 | Discharge: 2012-05-19 | Disposition: A | Discharge status: Home / Self Care | Payer: Managed Care, Other (non HMO) | Source: Ambulatory Visit | Attending: Obstetrics and Gynecology | Admitting: Obstetrics and Gynecology

## 2012-05-19 ENCOUNTER — Encounter (HOSPITAL_COMMUNITY): Payer: Self-pay | Admitting: Obstetrics and Gynecology

## 2012-05-19 ENCOUNTER — Telehealth: Payer: Self-pay | Admitting: Obstetrics and Gynecology

## 2012-05-19 DIAGNOSIS — D259 Leiomyoma of uterus, unspecified: Secondary | ICD-10-CM

## 2012-05-19 DIAGNOSIS — Z01818 Encounter for other preprocedural examination: Secondary | ICD-10-CM | POA: Insufficient documentation

## 2012-05-19 DIAGNOSIS — Z01812 Encounter for preprocedural laboratory examination: Secondary | ICD-10-CM | POA: Insufficient documentation

## 2012-05-19 DIAGNOSIS — Z9889 Other specified postprocedural states: Secondary | ICD-10-CM

## 2012-05-19 DIAGNOSIS — N7013 Chronic salpingitis and oophoritis: Secondary | ICD-10-CM | POA: Insufficient documentation

## 2012-05-19 DIAGNOSIS — N938 Other specified abnormal uterine and vaginal bleeding: Secondary | ICD-10-CM | POA: Insufficient documentation

## 2012-05-19 DIAGNOSIS — N736 Female pelvic peritoneal adhesions (postinfective): Secondary | ICD-10-CM

## 2012-05-19 DIAGNOSIS — N949 Unspecified condition associated with female genital organs and menstrual cycle: Secondary | ICD-10-CM | POA: Insufficient documentation

## 2012-05-19 HISTORY — PX: CYSTOSCOPY: SHX5120

## 2012-05-19 LAB — PREGNANCY, URINE: Preg Test, Ur: NEGATIVE

## 2012-05-19 SURGERY — ROBOTIC ASSISTED SUPRACERVICAL HYSTERECTOMY
Anesthesia: General | Wound class: Clean Contaminated

## 2012-05-19 MED ORDER — LACTATED RINGERS IV SOLN
INTRAVENOUS | Status: DC
  Administered 2012-05-19 (×2): via INTRAVENOUS
  Administered 2012-05-19: 50 mL/h via INTRAVENOUS

## 2012-05-19 MED ORDER — FENTANYL CITRATE 0.05 MG/ML IJ SOLN
INTRAMUSCULAR | Status: AC
  Filled 2012-05-19: qty 5

## 2012-05-19 MED ORDER — LACTATED RINGERS IR SOLN
Status: DC | PRN
  Administered 2012-05-19: 3000 mL

## 2012-05-19 MED ORDER — GLYCOPYRROLATE 0.2 MG/ML IJ SOLN
INTRAMUSCULAR | Status: AC
  Filled 2012-05-19: qty 1

## 2012-05-19 MED ORDER — PROPOFOL 10 MG/ML IV EMUL
INTRAVENOUS | Status: DC | PRN
  Administered 2012-05-19: 180 mg via INTRAVENOUS

## 2012-05-19 MED ORDER — VASOPRESSIN 20 UNIT/ML IJ SOLN
INTRAVENOUS | Status: DC | PRN
  Administered 2012-05-19: 10:00:00 via INTRAMUSCULAR

## 2012-05-19 MED ORDER — LIDOCAINE HCL (CARDIAC) 20 MG/ML IV SOLN
INTRAVENOUS | Status: AC
  Filled 2012-05-19: qty 5

## 2012-05-19 MED ORDER — PROPOFOL 10 MG/ML IV EMUL
INTRAVENOUS | Status: AC
  Filled 2012-05-19: qty 20

## 2012-05-19 MED ORDER — FENTANYL CITRATE 0.05 MG/ML IJ SOLN
INTRAMUSCULAR | Status: DC | PRN
  Administered 2012-05-19: 25 ug via INTRAVENOUS
  Administered 2012-05-19: 100 ug via INTRAVENOUS
  Administered 2012-05-19: 25 ug via INTRAVENOUS
  Administered 2012-05-19: 50 ug via INTRAVENOUS
  Administered 2012-05-19: 100 ug via INTRAVENOUS

## 2012-05-19 MED ORDER — PROMETHAZINE HCL 25 MG/ML IJ SOLN: 6.2500 mg | INTRAMUSCULAR | Status: DC | PRN

## 2012-05-19 MED ORDER — KETOROLAC TROMETHAMINE 30 MG/ML IJ SOLN
INTRAMUSCULAR | Status: DC | PRN
  Administered 2012-05-19: 30 mg via INTRAVENOUS

## 2012-05-19 MED ORDER — NEOSTIGMINE METHYLSULFATE 1 MG/ML IJ SOLN
INTRAMUSCULAR | Status: DC | PRN
  Administered 2012-05-19 (×2): 2 mg via INTRAVENOUS

## 2012-05-19 MED ORDER — HYDROMORPHONE HCL PF 1 MG/ML IJ SOLN
0.5000 mg | INTRAMUSCULAR | Status: DC | PRN
  Administered 2012-05-19: 0.5 mg via INTRAVENOUS

## 2012-05-19 MED ORDER — FENTANYL CITRATE 0.05 MG/ML IJ SOLN
25.0000 ug | INTRAMUSCULAR | Status: DC | PRN
  Administered 2012-05-19 (×3): 50 ug via INTRAVENOUS

## 2012-05-19 MED ORDER — NEOSTIGMINE METHYLSULFATE 1 MG/ML IJ SOLN
INTRAMUSCULAR | Status: AC
  Filled 2012-05-19: qty 10

## 2012-05-19 MED ORDER — ONDANSETRON HCL 4 MG/2ML IJ SOLN
INTRAMUSCULAR | Status: AC
  Filled 2012-05-19: qty 2

## 2012-05-19 MED ORDER — KETOROLAC TROMETHAMINE 60 MG/2ML IM SOLN
INTRAMUSCULAR | Status: DC | PRN
  Administered 2012-05-19: 30 mg via INTRAMUSCULAR

## 2012-05-19 MED ORDER — INDIGOTINDISULFONATE SODIUM 8 MG/ML IJ SOLN
INTRAMUSCULAR | Status: AC
  Filled 2012-05-19: qty 5

## 2012-05-19 MED ORDER — INDIGOTINDISULFONATE SODIUM 8 MG/ML IJ SOLN
INTRAMUSCULAR | Status: DC | PRN
  Administered 2012-05-19: 5 mL via INTRAVENOUS

## 2012-05-19 MED ORDER — KETOROLAC TROMETHAMINE 60 MG/2ML IM SOLN
INTRAMUSCULAR | Status: AC
  Filled 2012-05-19: qty 2

## 2012-05-19 MED ORDER — IBUPROFEN 600 MG PO TABS: 600.0000 mg | ORAL_TABLET | Freq: Four times a day (QID) | ORAL | Status: DC | PRN

## 2012-05-19 MED ORDER — LIDOCAINE HCL (CARDIAC) 20 MG/ML IV SOLN
INTRAVENOUS | Status: DC | PRN
  Administered 2012-05-19: 50 mg via INTRAVENOUS

## 2012-05-19 MED ORDER — ROCURONIUM BROMIDE 50 MG/5ML IV SOLN
INTRAVENOUS | Status: AC
  Filled 2012-05-19: qty 1

## 2012-05-19 MED ORDER — BUPIVACAINE HCL (PF) 0.25 % IJ SOLN
INTRAMUSCULAR | Status: AC
  Filled 2012-05-19: qty 30

## 2012-05-19 MED ORDER — BUPIVACAINE HCL (PF) 0.25 % IJ SOLN
INTRAMUSCULAR | Status: DC | PRN
  Administered 2012-05-19: 30 mL

## 2012-05-19 MED ORDER — IBUPROFEN 600 MG PO TABS
600.0000 mg | ORAL_TABLET | Freq: Four times a day (QID) | ORAL | Status: DC | PRN
  Administered 2012-05-19: 600 mg via ORAL
  Filled 2012-05-19: qty 1

## 2012-05-19 MED ORDER — MIDAZOLAM HCL 5 MG/5ML IJ SOLN
INTRAMUSCULAR | Status: DC | PRN
  Administered 2012-05-19: 2 mg via INTRAVENOUS

## 2012-05-19 MED ORDER — ALBUTEROL SULFATE HFA 108 (90 BASE) MCG/ACT IN AERS
INHALATION_SPRAY | RESPIRATORY_TRACT | Status: AC
  Filled 2012-05-19: qty 6.7

## 2012-05-19 MED ORDER — GLYCOPYRROLATE 0.2 MG/ML IJ SOLN
INTRAMUSCULAR | Status: DC | PRN
  Administered 2012-05-19: 0.4 mg via INTRAVENOUS
  Administered 2012-05-19: 0.1 mg via INTRAVENOUS
  Administered 2012-05-19: 0.4 mg via INTRAVENOUS

## 2012-05-19 MED ORDER — STERILE WATER FOR IRRIGATION IR SOLN
Status: DC | PRN
  Administered 2012-05-19: 1000 mL

## 2012-05-19 MED ORDER — ACETAMINOPHEN 325 MG PO TABS: 325.0000 mg | ORAL_TABLET | ORAL | Status: DC | PRN

## 2012-05-19 MED ORDER — KETOROLAC TROMETHAMINE 30 MG/ML IJ SOLN: 15.0000 mg | Freq: Once | INTRAMUSCULAR | Status: DC | PRN

## 2012-05-19 MED ORDER — DEXAMETHASONE SODIUM PHOSPHATE 10 MG/ML IJ SOLN
INTRAMUSCULAR | Status: AC
  Filled 2012-05-19: qty 1

## 2012-05-19 MED ORDER — ROCURONIUM BROMIDE 100 MG/10ML IV SOLN
INTRAVENOUS | Status: DC | PRN
  Administered 2012-05-19: 40 mg via INTRAVENOUS
  Administered 2012-05-19 (×7): 10 mg via INTRAVENOUS

## 2012-05-19 MED ORDER — MIDAZOLAM HCL 2 MG/2ML IJ SOLN
INTRAMUSCULAR | Status: AC
  Filled 2012-05-19: qty 2

## 2012-05-19 MED ORDER — DEXAMETHASONE SODIUM PHOSPHATE 10 MG/ML IJ SOLN
INTRAMUSCULAR | Status: DC | PRN
  Administered 2012-05-19: 10 mg via INTRAVENOUS

## 2012-05-19 MED ORDER — VASOPRESSIN 20 UNIT/ML IJ SOLN
INTRAMUSCULAR | Status: AC
  Filled 2012-05-19: qty 2

## 2012-05-19 MED ORDER — ARTIFICIAL TEARS OP OINT
TOPICAL_OINTMENT | OPHTHALMIC | Status: DC | PRN
  Administered 2012-05-19: 1 via OPHTHALMIC

## 2012-05-19 MED ORDER — ONDANSETRON HCL 8 MG PO TABS: 8.0000 mg | ORAL_TABLET | Freq: Three times a day (TID) | ORAL | Status: DC | PRN

## 2012-05-19 MED ORDER — ARTIFICIAL TEARS OP OINT
TOPICAL_OINTMENT | OPHTHALMIC | Status: AC
  Filled 2012-05-19: qty 3.5

## 2012-05-19 MED ORDER — MEPERIDINE HCL 25 MG/ML IJ SOLN: 6.2500 mg | INTRAMUSCULAR | Status: DC | PRN

## 2012-05-19 MED ORDER — ALBUTEROL SULFATE HFA 108 (90 BASE) MCG/ACT IN AERS
INHALATION_SPRAY | RESPIRATORY_TRACT | Status: DC | PRN
  Administered 2012-05-19: 2 via RESPIRATORY_TRACT

## 2012-05-19 MED ORDER — ONDANSETRON HCL 4 MG/2ML IJ SOLN
INTRAMUSCULAR | Status: DC | PRN
  Administered 2012-05-19: 4 mg via INTRAVENOUS

## 2012-05-19 MED ORDER — HYDROMORPHONE HCL PF 1 MG/ML IJ SOLN
INTRAMUSCULAR | Status: AC
  Administered 2012-05-19: 0.5 mg via INTRAVENOUS
  Filled 2012-05-19: qty 1

## 2012-05-19 MED ORDER — FENTANYL CITRATE 0.05 MG/ML IJ SOLN
INTRAMUSCULAR | Status: AC
  Administered 2012-05-19: 50 ug via INTRAVENOUS
  Filled 2012-05-19: qty 2

## 2012-05-19 MED ORDER — OXYCODONE-ACETAMINOPHEN 5-325 MG PO TABS
2.0000 | ORAL_TABLET | ORAL | Status: DC | PRN
  Administered 2012-05-19: 1 via ORAL
  Filled 2012-05-19: qty 1

## 2012-05-19 MED ORDER — MIDAZOLAM HCL 2 MG/2ML IJ SOLN: 0.5000 mg | Freq: Once | INTRAMUSCULAR | Status: DC | PRN

## 2012-05-19 MED ORDER — OXYCODONE-ACETAMINOPHEN 5-325 MG PO TABS: 1.0000 | ORAL_TABLET | ORAL | Status: AC | PRN

## 2012-05-19 SURGICAL SUPPLY — 76 items
ADH SKN CLS APL DERMABOND .7 (GAUZE/BANDAGES/DRESSINGS) ×4
APL SKNCLS STERI-STRIP NONHPOA (GAUZE/BANDAGES/DRESSINGS) ×2
BAG URINE DRAINAGE (UROLOGICAL SUPPLIES) ×6 IMPLANT
BARRIER ADHS 3X4 INTERCEED (GAUZE/BANDAGES/DRESSINGS) ×3 IMPLANT
BENZOIN TINCTURE PRP APPL 2/3 (GAUZE/BANDAGES/DRESSINGS) ×3 IMPLANT
BLADE LAP MORCELLATOR 15X9.5 (ELECTROSURGICAL) ×1 IMPLANT
BRR ADH 4X3 ABS CNTRL BYND (GAUZE/BANDAGES/DRESSINGS) ×2
CABLE HIGH FREQUENCY MONO STRZ (ELECTRODE) ×3 IMPLANT
CATH FOLEY 3WAY  5CC 16FR (CATHETERS) ×2
CATH FOLEY 3WAY  5CC 18FR (CATHETERS) ×1
CATH FOLEY 3WAY 5CC 16FR (CATHETERS) ×4 IMPLANT
CATH FOLEY 3WAY 5CC 18FR (CATHETERS) ×2 IMPLANT
CHLORAPREP W/TINT 26ML (MISCELLANEOUS) ×3 IMPLANT
CLOTH BEACON ORANGE TIMEOUT ST (SAFETY) ×3 IMPLANT
CONT PATH 16OZ SNAP LID 3702 (MISCELLANEOUS) ×3 IMPLANT
CORDS BIPOLAR (ELECTRODE) IMPLANT
COVER MAYO STAND STRL (DRAPES) ×3 IMPLANT
COVER TABLE BACK 60X90 (DRAPES) ×6 IMPLANT
COVER TIP SHEARS 8 DVNC (MISCELLANEOUS) ×2 IMPLANT
COVER TIP SHEARS 8MM DA VINCI (MISCELLANEOUS) ×2
DECANTER SPIKE VIAL GLASS SM (MISCELLANEOUS) ×3 IMPLANT
DERMABOND ADVANCED (GAUZE/BANDAGES/DRESSINGS) ×2
DERMABOND ADVANCED .7 DNX12 (GAUZE/BANDAGES/DRESSINGS) ×2 IMPLANT
DRAPE HUG U DISPOSABLE (DRAPE) ×3 IMPLANT
DRAPE LG THREE QUARTER DISP (DRAPES) ×6 IMPLANT
DRAPE MONITOR DA VINCI (DRAPE) IMPLANT
DRAPE WARM FLUID 44X44 (DRAPE) ×3 IMPLANT
ELECT REM PT RETURN 9FT ADLT (ELECTROSURGICAL) ×3
ELECTRODE REM PT RTRN 9FT ADLT (ELECTROSURGICAL) ×2 IMPLANT
EVACUATOR SMOKE 8.L (FILTER) ×3 IMPLANT
GAUZE VASELINE 3X9 (GAUZE/BANDAGES/DRESSINGS) IMPLANT
GLOVE BIOGEL PI IND STRL 7.0 (GLOVE) ×6 IMPLANT
GLOVE BIOGEL PI INDICATOR 7.0 (GLOVE) ×3
GLOVE ECLIPSE 6.5 STRL STRAW (GLOVE) ×9 IMPLANT
GOWN STRL REIN XL XLG (GOWN DISPOSABLE) ×18 IMPLANT
GRASPER BIPOLAR FEN DA VINCI (INSTRUMENTS)
GRASPER BPLR FEN DVNC (INSTRUMENTS) IMPLANT
KIT ACCESSORY DA VINCI DISP (KITS) ×1
KIT ACCESSORY DVNC DISP (KITS) ×2 IMPLANT
KIT DISP ACCESSORY 4 ARM (KITS) IMPLANT
NDL SAFETY ECLIPSE 18X1.5 (NEEDLE) IMPLANT
NDL SPNL 22GX7 QUINCKE BK (NEEDLE) IMPLANT
NEEDLE HYPO 18GX1.5 SHARP (NEEDLE) ×3
NEEDLE HYPO 22GX1.5 SAFETY (NEEDLE) ×3 IMPLANT
NEEDLE SPNL 22GX7 QUINCKE BK (NEEDLE) ×3 IMPLANT
OCCLUDER COLPOPNEUMO (BALLOONS) ×1 IMPLANT
PACK LAVH (CUSTOM PROCEDURE TRAY) ×3 IMPLANT
PAD PREP 24X48 CUFFED NSTRL (MISCELLANEOUS) ×6 IMPLANT
PLUG CATH AND CAP STER (CATHETERS) ×3 IMPLANT
PROTECTOR NERVE ULNAR (MISCELLANEOUS) ×6 IMPLANT
SET CYSTO W/LG BORE CLAMP LF (SET/KITS/TRAYS/PACK) ×1 IMPLANT
SET IRRIG TUBING LAPAROSCOPIC (IRRIGATION / IRRIGATOR) ×4 IMPLANT
SOLUTION ELECTROLUBE (MISCELLANEOUS) ×3 IMPLANT
SPONGE LAP 18X18 X RAY DECT (DISPOSABLE) IMPLANT
STRIP CLOSURE SKIN 1/2X4 (GAUZE/BANDAGES/DRESSINGS) ×3 IMPLANT
SUT MNCRL AB 3-0 PS2 27 (SUTURE) IMPLANT
SUT VIC AB 0 CT1 27 (SUTURE) ×18
SUT VIC AB 0 CT1 27XBRD ANBCTR (SUTURE) ×4 IMPLANT
SUT VIC AB 0 CT1 27XBRD ANTBC (SUTURE) ×8 IMPLANT
SUT VICRYL 0 UR6 27IN ABS (SUTURE) ×6 IMPLANT
SUT VICRYL RAPIDE 3 0 (SUTURE) ×2 IMPLANT
SYR 50ML LL SCALE MARK (SYRINGE) ×3 IMPLANT
SYR 5ML LL (SYRINGE) ×1 IMPLANT
SYSTEM CONVERTIBLE TROCAR (TROCAR) ×3 IMPLANT
TIP UTERINE 5.1X6CM LAV DISP (MISCELLANEOUS) IMPLANT
TIP UTERINE 6.7X10CM GRN DISP (MISCELLANEOUS) ×1 IMPLANT
TIP UTERINE 6.7X6CM WHT DISP (MISCELLANEOUS) IMPLANT
TIP UTERINE 6.7X8CM BLUE DISP (MISCELLANEOUS) IMPLANT
TOWEL OR 17X24 6PK STRL BLUE (TOWEL DISPOSABLE) ×9 IMPLANT
TROCAR 12M 150ML BLUNT (TROCAR) ×3 IMPLANT
TROCAR DISP BLADELESS 8 DVNC (TROCAR) ×2 IMPLANT
TROCAR DISP BLADELESS 8MM (TROCAR) ×1
TROCAR XCEL 12X100 BLDLESS (ENDOMECHANICALS) ×3 IMPLANT
TUBING FILTER THERMOFLATOR (ELECTROSURGICAL) ×3 IMPLANT
WARMER LAPAROSCOPE (MISCELLANEOUS) ×3 IMPLANT
WATER STERILE IRR 1000ML POUR (IV SOLUTION) ×9 IMPLANT

## 2012-05-19 NOTE — H&P (View-Only) (Signed)
[name] is a 41 y.o. female G1P1 who presents for robot assisted hysterectomy because of dysfunctional uterine bleeding and uterine fibroids. This patient is S/P myomectomy x 2 who developed increasing irregular menses in November 2012.  She began to bleed twice a month for a total of 14 days with the need to change a pad with a tampon 4 times a day.  She admits to some cramping that had not previously been present since her Mirena insertion in 11/2007.  She was placed on oral contraceptives, in addition to having a Mirena IUD but her symptoms persisted.  She denies urinary tract or vaginitis symptoms, dyspareunia or changes in her bowel function.  A sonohysterogram performed in April 2013 demonstrated uterus 10.1 x 7.14 x 5.59 cm with a left anterior fibroid 5.3 x 4.8 x 4.1 cm and # 3, 3-3.5 cm fibroids in the left myometrium;  IUD was properly placed within endometrial cavity;  left ovary-3.74 x 2.1 x 2.0 cm, right ovary 3.8 x 3.13 x 2.96 cm containing a dominant follicle 3.1 x 2.1 x 2.0 cm. Patient's TSH was normal at that same time and gonorrhea and chlamydia cultures were negative.  Though patient is aware of the various medical and surgical management options for her bleeding  and fibroid issues, she has chosen to have definitive therapy in the form of hysterectomy though she would like to preserve her cervix.   Past Medical History  OB History: G1P1 SVD,  1997   GYN History: menarche 41 YO ; LMP see HPI;  Contraception-Mirena IUD; Has a history of  HSV-2 and CIN-I; Most recent  mammogram was normal ( 09/2011) and   PAP smear-normal (01/2012)  Medical History: asthma, fibroids, hypertension, renal stones, depression, congenital left ear deafness, migraines, left breast ductal papilloma  Surgical History: 2003 Diagnostic Laparoscopy for pelvic pain, ovarian cystectomy and clinical findings of endometriosis though not demonstrated by pathology report;  2004 Myomectomy;  2005 Left Breast   Biopsy-benign   2010 Laparoscopic right ovarian cystectomies (# 3);  2010 Robot assisted myomectomy with lysis of adhesions;  2012 Left Breast Needle Biopsy-benign Denies problems with anesthesia.  Does not receive blood products due to Jehovah's Witness faith   Family History: heart disease, hypertension, thyroid disease, lung cancer, breast cancer (maternal grand-mother after menopause), substance abuse.  Social History:  Patient is single, employed with [employer]; Denies alcohol, tobacco or illicit drug use  Outpatient Encounter Prescriptions as of 05/04/2012 Medication Sig Dispense Refill . albuterol (PROVENTIL HFA) 108 (90 BASE) MCG/ACT inhaler Inhale 2 puffs into the lungs every 4 (four) hours as needed. Shortness of breath and wheezing  1 Inhaler  1 . Cholecalciferol (VITAMIN D) 1000 UNITS capsule Take 2,000 Units by mouth daily.      . hydrochlorothiazide (HYDRODIURIL) 25 MG tablet Take 1 tablet (25 mg total) by mouth daily.  90 tablet  3 . levonorgestrel (MIRENA) 20 MCG/24HR IUD 1 each by Intrauterine route once.     . metoprolol (LOPRESSOR) 50 MG tablet Take 1 tablet (50 mg total) by mouth 2 (two) times daily.  180 tablet  3 . Multiple Vitamins-Calcium (ONE-A-DAY WOMENS PO) Take 1 tablet by mouth daily.     . potassium chloride SA (K-DUR,KLOR-CON) 20 MEQ tablet Take 40 mEq by mouth daily.     . valACYclovir (VALTREX) 500 MG tablet Take 1 tablet (500 mg total) by mouth daily as needed. outbreak  90 tablet  1 . DISCONTD: potassium chloride SA (K-DUR,KLOR-CON) 20  MEQ tablet Take 1 tablet (20 mEq total) by mouth daily.  90 tablet  3   No Known Allergies Denies sensitivity to latex, shellfish, soy, peanuts or adhesives  ROS:  denies frequent headachse, vision changes, dysphagia, tinnitus, dizziness,  chest pain, shortness of breath (except with asthma) , nausea, vomiting, diarrhea, dysuria, hematuria, pelvic pain, swelling of joints,easy bruising,  myalgias, arthralgias, skin  rashes and except as is mentioned in the history of present illness, patient's review of systems is otherwise negative   Physical Exam    BP 110/70  Pulse 80  Temp(Src) 98.5 F (36.9 C) (Oral)  Resp 14  Ht 5\' 3"  (1.6 m)  Wt 187 lb (84.823 kg)  BMI 33.13 kg/m2  Neck: supple without masses Lungs: clear to auscultation Heart: regular rate and rhythm Abdomen: soft except firm palpable mass from pelvis to 2 fingers above symphysis pubis that is tender; no guarding or rebound Pelvic:EGBUS-wnl, vagina-normal rugae, cervix-normal; uterus-irregular, tender, slightly mobile, 14-16 weeks size; adnexae-obscured by mass effect of uterus Extremities:  no clubbing, cyanosis or edema   Assesment: Dysfunctional Uterine Bleeding                     Uterine Fibroids  Disposition:  A discussion was held with patient regarding the indication for her procedure(s) along with the risks, which include but are not limited to: reaction to anesthesia, damage to adjacent organs, infection, excessive bleeding, early menopause, pelvic prolapse, bleeding from the cervix and possible fibroid formation on the cervix.  Patient was given the Miralax bowel prep to be completed 24 hours prior to her procedure.  The patient verbalized understanding of these risks and preoperative instructions and wants to proceed with a robot assisted laparoscopic supracervical hysterectomy at [facility] May 19, 2012 at 11 a.m.   CSN# [id number]   Jaamal Farooqui J. Lowell Guitar, PA-C  for Dr. Estanislado Pandy

## 2012-05-19 NOTE — Transfer of Care (Signed)
Immediate Anesthesia Transfer of Care Note  Patient: [name]  Procedure(s) Performed: Procedure(s) (LRB): ROBOTIC ASSISTED SUPRACERVICAL HYSTERECTOMY (N/A) UNILATERAL SALPINGECTOMY (Left) CYSTOSCOPY (N/A)  Patient Location: PACU  Anesthesia Type: General  Level of Consciousness: sedated  Airway & Oxygen Therapy: Patient Spontanous Breathing and Patient connected to nasal cannula oxygen  Post-op Assessment: Report given to PACU RN  Post vital signs: Reviewed and stable  Complications: No apparent anesthesia complications

## 2012-05-19 NOTE — Anesthesia Preprocedure Evaluation (Addendum)
Anesthesia Evaluation  Patient identified by MRN, date of birth, ID band Patient awake    Reviewed: Allergy & Precautions, H&P , Patient's Chart, lab work & pertinent test results, reviewed documented beta blocker date and time   History of Anesthesia Complications Negative for: history of anesthetic complications  Airway Mallampati: II TM Distance: >3 FB Neck ROM: full    Dental No notable dental hx.    Pulmonary neg pulmonary ROS, asthma ,  breath sounds clear to auscultation  Pulmonary exam normal       Cardiovascular Exercise Tolerance: Good hypertension, negative cardio ROS  Rhythm:regular Rate:Normal     Neuro/Psych negative neurological ROS  negative psych ROS   GI/Hepatic negative GI ROS, Neg liver ROS, GERD-  Controlled,  Endo/Other  negative endocrine ROS  Renal/GU negative Renal ROS     Musculoskeletal   Abdominal   Peds  Hematology negative hematology ROS (+)   Anesthesia Other Findings Allergy     Asthma        Hypertension   pulmonary Herpes genitalia        H/O: myomectomy     GERD (gastroesophageal reflux disease)   no meds    Heart murmur- patients states echo was normal.  Normal exercise tolerance and no need for follow up with cardiology.  symtpoms resolved with potassium supplementation     Reproductive/Obstetrics negative OB ROS                           Anesthesia Physical Anesthesia Plan  ASA: II  Anesthesia Plan: General   Post-op Pain Management:    Induction:   Airway Management Planned:   Additional Equipment:   Intra-op Plan:   Post-operative Plan:   Informed Consent: I have reviewed the patients History and Physical, chart, labs and discussed the procedure including the risks, benefits and alternatives for the proposed anesthesia with the patient or authorized representative who has indicated his/her understanding and acceptance.   Dental  Advisory Given  Plan Discussed with: CRNA and Surgeon  Anesthesia Plan Comments:         Anesthesia Quick Evaluation

## 2012-05-19 NOTE — Brief Op Note (Signed)
05/19/2012  1:25 PM  PATIENT:  [name]  41 y.o. female  PRE-OPERATIVE DIAGNOSIS:  Fibroids  POST-OPERATIVE DIAGNOSIS:  Fibroids with left hydrosalpynx    Procedure(s): ROBOTIC ASSISTED SUPRACERVICAL HYSTERECTOMY UNILATERAL SALPINGECTOMY CYSTOSCOPY    Surgeon(s): Esmeralda Arthur, MD   ASSISTANTS: Henreitta Leber PA-C   ANESTHESIA:   general  LOCAL MEDICATIONS USED:  MARCAINE     SPECIMEN:  Source of Specimen:  Uterus, fibroid and left tube  DISPOSITION OF SPECIMEN:  PATHOLOGY  COUNTS:  YES  ESTIMATED BLOOD LOSS: 100 cc  PATIENT DISPOSITION:  PACU - hemodynamically stable.       Falana Clagg A MD 05/19/2012 1:25 PM

## 2012-05-19 NOTE — Addendum Note (Signed)
Addendum  created 05/19/12 1430 by Algis Greenhouse, CRNA   Modules edited:Charges VN

## 2012-05-19 NOTE — Anesthesia Postprocedure Evaluation (Signed)
Anesthesia Post Note  Patient: [name]  Procedure(s) Performed: Procedure(s) (LRB): ROBOTIC ASSISTED SUPRACERVICAL HYSTERECTOMY (N/A) UNILATERAL SALPINGECTOMY (Left) CYSTOSCOPY (N/A)  Anesthesia type: General  Patient location: PACU  Post pain: Pain level controlled  Post assessment: Post-op Vital signs reviewed  Last Vitals:  Filed Vitals:   05/19/12 1330  BP: 127/72  Pulse: 88  Temp:   Resp: 18    Post vital signs: Reviewed  Level of consciousness: sedated  Complications: No apparent anesthesia complicationsfj

## 2012-05-19 NOTE — Telephone Encounter (Signed)
Pt is post supercervical hysterectomy today, discussed pt conversation with Dr. Normand Sloop per telephone instructed to ask pt to come to [facility] for evaluation, veinous duplex doppler bilaterally if indicated, discussed importance of evaluation of circulation and pt agrees to come in and will have  mother to drive her here. Lavera Guise, CNM

## 2012-05-19 NOTE — Progress Notes (Signed)
Contacted Dr. Estanislado Pandy in reference to orders for 3rd floor she said they had all the orders they needed I did get orders for outpatient with extended recovery and saline lock of I.V.

## 2012-05-19 NOTE — Op Note (Signed)
Preoperative diagnosis: Symptomatic uterine fibroids  Postoperative diagnosis: Same with pelvic adhesions and left hydrosalpinx  Anesthesia: Gen.  Anesthesiologist: Dr. Brayton Caves  Procedure: Robotically-assisted supracervical hysterectomy, lysis of adhesion, left salpingectomy  Surgeon: Dr. Dois Davenport Grantham Hippert  Assistant: Henreitta Leber P.A.-C.  Procedure:  After being informed of the planned procedure with possible complications including bleeding, infection, injury to other organs, need for laparotomy, growth of new fibroid on the cervical stump and possibility of cyclic bleeding due to supracervical approach, informed consent is obtained and patient is taken to or #7. Marland Kitchen She is placed in the lithotomy position on a sticky mattress and beanbag with arms padded and tucked on each side and knee-high sequential compressive devices.She is prepped and draped in a sterile fashion and a Foley catheter is inserted in her bladder.  Pelvic exam reveals a enlarged uterus approximately 14 weeks in size with fullness in the posterior cul-de-sac. Adnexa are not felt. A weighted speculum is inserted in the vagina and the anterior lip of the cervix is grasped with a tenaculum forcep. The uterus is sounded at 12 cm. The cervix was easily dilated using Hegar dilator until #28 which allows for easy placement of a RUMI #10 intrauterine manipulator. This is also accompanied by a vaginal occluder and a 3.5 KOH ring.  We infiltrate the supraumbilical area with 5 cc of Marcaine 0.25 and perform a 10 mm vertical incision approximately 3 cm above the umbilicus. This was brought down bluntly to the fascia which is identified and grasped with Coker forceps. The fascia is incised with Mayo scissors and peritoneum is entered bluntly. A pursestring suture of 0 Vicryl is placed on the fascia and a 10 mm Hassan trocar is easily inserted in the abdominal cavity and held in place by the pursestring suture. This allows for easy  insufflation of a pneumoperitoneum using warmed CO2 at a maximum pressure of 15 mm of mercury. We then positioned to 8mm robotic trocar on the left, 1 8mm robotic trocar on the right and a 10 mm patient's side assistant trocar on the right. All the trochars are inserted under direct position after infiltrating with Marcaine 0.25. Previous incisions are used for placement of trochars and 2 keloids are removed in the process. The robot is docked on the left of the patient. A monopolar scissors is inserted in arm #1, a PK gyrus forcep is inserted in arm #2 and a pro grasp is inserted in arm #3. Preparation and docking takes 35 minutes.  Observation: The pelvis is completely sealed by a poorly mobile and enlarged uterus showing multiple fibroids. The dominant fibroid is a left broad ligament fibroid measuring 8 cm and completely attached to the left pelvic wall. We see fine adhesions between both tubes both ovaries in the back of the uterus. Posterior cul-de-sac is not accessible at this time. Liver is observed and normal. There seems to be increased inflammation in the whole pelvis.  We proceed with lysis of adhesion in order to free both tubes and ovaries from the posterior aspect of the uterus which then gives [place] a better view of the left broad ligament fibroid. We proceed on the left side by cauterizing the left tube and sectioning it. We then cauterized the left round ligament and sectioned it. We are able to cauterize the left utero-ovarian ligament and sectioned. This gives [place] entry into the broad ligament anteriorly which is brought down all the way to above the cervix. Entry in the posterior broad ligament allows [place]  to see that this fibroid will need to be removed prior to reaching uterine vessels. We moved to the right side and cauterized the right round ligament and sectioned at. We cauterized the right tube and sectioned. We cauterized the right utero-ovarian ligament and sectioned. Anterior sheath of  the broad ligament and sharply dissected anteriorly in order to bluntly and sharply dissect the bladder from the anterior vagina. We are unable at this time to complete the opening of the anterior serosa do to the presence of the left fibroid. On the right side we are able to entered the posterior sheath of the broad ligament and to stand all the way to the cervix keeping the right ureter under direct visitation. This gives [place] a great dissection of the vascular space and uterine artery is already isolated at the level of the KOH ring.  Vasopressin 20 units in 100 cc is utilized to infiltrate the serosa of the left pelvic fibroid in order to proceed with myomectomy. Using sharp and hot monopolar scissors we opened the serosa. Using traction countertraction and sharp dissection we proceed systematically to enucleate the left broad ligament fibroid completely from the pelvic wall and from the uterus. In the process uterine artery is identified going above the fibroid traveling in the serosa all the way to its insertion to the uterus. We are able to preserve it and dissected downwards as we are in enucleating the fibroid. The fibroid is deposited in the left upper quadrant. Left ureter is now visible. The left vascular pedicle is completely isolated 2 to the previous dissection. We proceed with cauterization of the uterine vessels on both sides at the level of the coring without difficulty. We can then severe the uterine body from the cervix using an open monopolar scissors until the tip of the RUMI manipulator is visualized. The stump is hemostatic. The manipulator is removed allowing [place] to cauterize the endocervical canal both with monopolar and bipolar cauterization. Examining the left Anexsia we realized that there is a significant left hydrosalpinx. We proceed with salpingectomy using bipolar cauterization and sharp dissection. The tube is removed through the 10 mm patient's side assistant trocar. The IUD which  was localized in the uterus with no strings is then removed through the same port allowing [place] to then proceed with morcellation.  Console time is 3 and half hours. The robot is undocked. The patient's side assistant trocar is removed and replaced by the morcellator under direct visitation. We then proceed with systematic morcellation of the uterus and the broad ligament fibroid. Morcellation takes 40 minutes.  We then irrigated profusely with warm saline to confirm a satisfactory hemostasis. Due to inflammation throughout the pelvis and extensive dissection on the left pelvic wall, we are unable to visualize the left ureter before exiting the abdominal cavity. A sheet of Interceed is deposited in the pelvis covering the cervical stump. All trochars are then removed after evacuating the pneumoperitoneum. The fascia of the supraumbilical incision is closed with the previously placed pursestring suture of 0 Vicryl. The fascia of the patient side assistant trocar is closed with a figure-of-eight stitch of 0 Vicryl. The skin of all incision is closed with subcuticular suture of 3-0 Vicryl repeat and Dermabond.  Indigo carmine is given to the patient and we proceed with cystoscopy which rapidly reveals a normal bladder and 2 strong ureteral jets. The bladder is then emptied.  Instrument and sponge count is complete x2. Estimated blood loss is 100 cc. The procedure is well  tolerated by the patient is taken to recovery room in a well and stable condition.  Specimen: Uterus, fibroid and left tube are sent to pathology for a total weight of 227 g

## 2012-05-19 NOTE — Interval H&P Note (Signed)
History and Physical Interval Note:  05/19/2012 7:19 AM  [name]  has presented today for surgery, with the diagnosis of Fibroids  The various methods of treatment have been discussed with the patient and family. After consideration of risks, benefits and other options for treatment, the patient has consented to  Procedure(s) (LRB): ROBOTIC ASSISTED TOTAL HYSTERECTOMY (N/A) as a surgical intervention .  The patient's history has been reviewed, patient examined, no change in status, stable for surgery.  I have reviewed the patients' chart and labs.  Questions were answered to the patient's satisfaction.     Erryn Dickison A

## 2012-05-19 NOTE — Discharge Instructions (Signed)
POST-OPERATIVE INSTRUCTIONS TO PATIENT  Call [facility]  [phone])  for excessive pain, bleeding or temperature greater than or equal to 100.4 degrees (orally).    No driving for 1 weeks No lifting (more than 20 lbs) for 6 weeks No sexual activity for 6 weeks  Pain management:  Use Ibuprofen 600 mg every 6 hours for 5 days and then as needed. Use your pain medication as needed to maintain a pain level at or below 3/10 Use Colace 1-2 capsules per day as long as you are using pain  medication to avoid constipation.       Diet: normal  Bathing: may shower day after surgery  Wound Care: keep incisions clean and dry  Return to Dr. Estanislado Pandy on 06/02/12 at 11:00 am  Return to work: To be determined at post operative visit.    Roberth Berling A MD 05/19/2012 1:32 PM

## 2012-05-20 ENCOUNTER — Encounter (HOSPITAL_COMMUNITY): Payer: Self-pay | Admitting: *Deleted

## 2012-05-20 ENCOUNTER — Observation Stay (HOSPITAL_COMMUNITY)
Admission: AD | Admit: 2012-05-20 | Discharge: 2012-05-20 | Disposition: A | Discharge status: Home / Self Care | Payer: Managed Care, Other (non HMO) | Source: Ambulatory Visit | Attending: Obstetrics and Gynecology | Admitting: Obstetrics and Gynecology

## 2012-05-20 DIAGNOSIS — Z90710 Acquired absence of both cervix and uterus: Secondary | ICD-10-CM

## 2012-05-20 DIAGNOSIS — M79609 Pain in unspecified limb: Secondary | ICD-10-CM

## 2012-05-20 DIAGNOSIS — J45909 Unspecified asthma, uncomplicated: Secondary | ICD-10-CM

## 2012-05-20 DIAGNOSIS — R209 Unspecified disturbances of skin sensation: Secondary | ICD-10-CM | POA: Insufficient documentation

## 2012-05-20 DIAGNOSIS — Y838 Other surgical procedures as the cause of abnormal reaction of the patient, or of later complication, without mention of misadventure at the time of the procedure: Secondary | ICD-10-CM | POA: Insufficient documentation

## 2012-05-20 DIAGNOSIS — T148XXA Other injury of unspecified body region, initial encounter: Secondary | ICD-10-CM | POA: Diagnosis present

## 2012-05-20 DIAGNOSIS — IMO0002 Reserved for concepts with insufficient information to code with codable children: Principal | ICD-10-CM | POA: Insufficient documentation

## 2012-05-20 DIAGNOSIS — I10 Essential (primary) hypertension: Secondary | ICD-10-CM

## 2012-05-20 DIAGNOSIS — M79669 Pain in unspecified lower leg: Secondary | ICD-10-CM

## 2012-05-20 DIAGNOSIS — G988 Other disorders of nervous system: Secondary | ICD-10-CM | POA: Insufficient documentation

## 2012-05-20 DIAGNOSIS — M7989 Other specified soft tissue disorders: Secondary | ICD-10-CM | POA: Insufficient documentation

## 2012-05-20 LAB — CBC WITH DIFFERENTIAL/PLATELET
Eosinophils Absolute: 0 10*3/uL (ref 0.0–0.7)
Hemoglobin: 11.1 g/dL — ABNORMAL LOW (ref 12.0–15.0)
Lymphs Abs: 1.1 10*3/uL (ref 0.7–4.0)
MCH: 29.2 pg (ref 26.0–34.0)
Monocytes Relative: 6 % (ref 3–12)
Neutro Abs: 9.2 10*3/uL — ABNORMAL HIGH (ref 1.7–7.7)
Neutrophils Relative %: 84 % — ABNORMAL HIGH (ref 43–77)
RBC: 3.8 MIL/uL — ABNORMAL LOW (ref 3.87–5.11)

## 2012-05-20 MED ORDER — POTASSIUM CHLORIDE CRYS ER 20 MEQ PO TBCR
40.0000 meq | EXTENDED_RELEASE_TABLET | Freq: Every day | ORAL | Status: DC
  Administered 2012-05-20: 40 meq via ORAL
  Filled 2012-05-20: qty 2

## 2012-05-20 MED ORDER — ALBUTEROL SULFATE HFA 108 (90 BASE) MCG/ACT IN AERS
2.0000 | INHALATION_SPRAY | RESPIRATORY_TRACT | Status: DC | PRN
  Filled 2012-05-20: qty 6.7

## 2012-05-20 MED ORDER — OXYCODONE-ACETAMINOPHEN 5-325 MG PO TABS
1.0000 | ORAL_TABLET | ORAL | Status: DC | PRN
  Administered 2012-05-20 (×2): 1 via ORAL
  Filled 2012-05-20 (×2): qty 1

## 2012-05-20 MED ORDER — HYDROCHLOROTHIAZIDE 25 MG PO TABS
25.0000 mg | ORAL_TABLET | Freq: Every day | ORAL | Status: DC
  Administered 2012-05-20: 25 mg via ORAL
  Filled 2012-05-20: qty 1

## 2012-05-20 MED ORDER — METOPROLOL TARTRATE 50 MG PO TABS
50.0000 mg | ORAL_TABLET | Freq: Two times a day (BID) | ORAL | Status: DC
  Administered 2012-05-20: 50 mg via ORAL
  Filled 2012-05-20 (×2): qty 1

## 2012-05-20 MED ORDER — ONDANSETRON HCL 4 MG PO TABS: 8.0000 mg | ORAL_TABLET | Freq: Three times a day (TID) | ORAL | Status: DC | PRN

## 2012-05-20 MED ORDER — IBUPROFEN 600 MG PO TABS: 600.0000 mg | ORAL_TABLET | Freq: Four times a day (QID) | ORAL | Status: DC | PRN

## 2012-05-20 NOTE — Progress Notes (Signed)
Subjective: Patient reports numbness and tingling in left lower leg has improved this am.  Able to ambulate without difficulty.  Just completed doppler flow evaluation--negative for DVT/baker's cyst.  Had small amount of bleeding from right lateral robotic incision, but resolved now.  Mild nausea this am, but feels it was related to taking pain medication on empty stomach.  Tolerating regular diet now.  Minimal thin serosanguinous vaginal discharge.  Denies fever or any other symptom.  Objective: I have reviewed patient's vital signs, intake and output and labs.  General: alert Resp: clear to auscultation bilaterally Cardio: regular rate and rhythm, S1, S2 normal, no murmur, click, rub or gallop GI: soft, non-tender; bowel sounds normal; no masses,  no organomegaly Extremities: Right leg WNL--no edema, normal sensation, good ROM.  Left leg--no edema, DTR 2+ without clonus.  Full ROM, negative Homan's.  Slightly diminished sensation to touch in calf when compared to right leg. Vaginal Bleeding: minimal, with small amount serosanguinous d/c, no odor. Robotic incisions all CDI.  Results for orders placed during the hospital encounter of 05/20/12 (from the past 24 hour(s))  CBC WITH DIFFERENTIAL     Status: Abnormal   Collection Time   05/20/12  7:06 AM      Component Value Range   WBC 11.0 (*) 4.0 - 10.5 K/uL   RBC 3.80 (*) 3.87 - 5.11 MIL/uL   Hemoglobin 11.1 (*) 12.0 - 15.0 g/dL   HCT 40.9 (*) 81.1 - 91.4 %   MCV 86.1  78.0 - 100.0 fL   MCH 29.2  26.0 - 34.0 pg   MCHC 33.9  30.0 - 36.0 g/dL   RDW 78.2  95.6 - 21.3 %   Platelets 279  150 - 400 K/uL   Neutrophils Relative 84 (*) 43 - 77 %   Neutro Abs 9.2 (*) 1.7 - 7.7 K/uL   Lymphocytes Relative 10 (*) 12 - 46 %   Lymphs Abs 1.1  0.7 - 4.0 K/uL   Monocytes Relative 6  3 - 12 %   Monocytes Absolute 0.7  0.1 - 1.0 K/uL   Eosinophils Relative 0  0 - 5 %   Eosinophils Absolute 0.0  0.0 - 0.7 K/uL   Basophils Relative 0  0 - 1 %   Basophils Absolute 0.0  0.0 - 0.1 K/uL   Vascular Ultrasound  Left lower extremity venous duplex has been completed. Preliminary findings: Left= No evidence of DVT or baker's cyst.  EUNICE, JILL RDMS  05/20/2012, 8:52 AM   Assessment/Plan: Robotically-assisted supracervical hysterectomy, lysis of adhesion, left salpingectomy--day 1 Probable mild nerve compression in left leg--negative for DVT, status improving.  Plan: Consulted with Dr. Estanislado Pandy Will d/c home today. Continue Motrin around the clock. May take warm shower or bath. Continue to stretch and ambulate. Dr. Estanislado Pandy will f/u with the patient by phone tomorrow for update, and patient is to call [facility] with any issues or concerns.   Nigel Bridgeman 05/20/2012, 10:26 AM

## 2012-05-20 NOTE — Progress Notes (Signed)
*  PRELIMINARY RESULTS* Vascular Ultrasound Left lower extremity venous duplex has been completed.  Preliminary findings: Left= No evidence of DVT or baker's cyst.  EUNICE, Shawnita Krizek RDMS 05/20/2012, 8:52 AM

## 2012-05-20 NOTE — Discharge Summary (Signed)
Physician Discharge Summary  Patient ID: [name] MRN: [id number] DOB/AGE: June 30, 1971 40 y.o.  Admit date: 05/19/2012 Discharge date: 05/20/2012  Admission Diagnoses: Fibroids  Discharge Diagnoses: Fibroids  Discharged Condition: good  Hospital Course:   Consults: None  Treatments: surgery: Robotic supracervical hysterectomy with left salpyngectomy and cystoscopy  Disposition: 01-Home or Self Care  Discharge Orders    Future Appointments: Provider: Department: Dept Phone: Center:   06/02/2012 11:00 AM Esmeralda Arthur, MD [facility] [phone] None     Future Orders Please Complete By Expires   Catherize Prn If Unable To Void        Medication List  As of 05/20/2012 10:28 AM   STOP taking these medications         levonorgestrel 20 MCG/24HR IUD         TAKE these medications         albuterol 108 (90 BASE) MCG/ACT inhaler   Commonly known as: PROVENTIL HFA;VENTOLIN HFA   Inhale 2 puffs into the lungs every 4 (four) hours as needed. Shortness of breath and wheezing      hydrochlorothiazide 25 MG tablet   Commonly known as: HYDRODIURIL   Take 1 tablet (25 mg total) by mouth daily.      metoprolol 50 MG tablet   Commonly known as: LOPRESSOR   Take 1 tablet (50 mg total) by mouth 2 (two) times daily.      ONE-A-DAY WOMENS PO   Take 1 tablet by mouth daily.      oxyCODONE-acetaminophen 5-325 MG per tablet   Commonly known as: PERCOCET   Take 1 tablet by mouth every 4 (four) hours as needed for pain.      potassium chloride SA 20 MEQ tablet   Commonly known as: K-DUR,KLOR-CON   Take 40 mEq by mouth daily.      Vitamin D 1000 UNITS capsule   Take 2,000 Units by mouth daily.             Signed: Esmeralda Arthur, MD 05/20/2012, 10:28 AM

## 2012-05-20 NOTE — Addendum Note (Signed)
Addendum  created 05/20/12 0958 by Shanon Payor, CRNA   Modules edited:Notes Section

## 2012-05-20 NOTE — Plan of Care (Signed)
Problem: Phase II Progression Outcomes Goal: Other Phase II Outcomes/Goals Outcome: Completed/Met Date Met:  05/20/12 Doppler negative for DVT
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
## 2012-05-20 NOTE — MAU Note (Signed)
M. Chiquita Loth CNM in to see pt.

## 2012-05-20 NOTE — MAU Note (Signed)
Pt post op robotic hysterectomy 6/26.  Noticed swelling and numbness in left leg prior to discharge  Which worsened tonight.  Unable to bear weight on left leg.

## 2012-05-20 NOTE — Discharge Instructions (Signed)
Per Dr. Estanislado Pandy: Continue taking Motrin every 6 hours for the next 24-48 hours--this is to be taken even if you don't need it for pain. May take a warm shower or bath today for comfort. Work on stretching your leg muscles, and walk around frequently to aid your circulation.  Dr. Estanislado Pandy will call you tomorrow to check on your status. Call [facility] at [phone] with any concerns or questions.

## 2012-05-20 NOTE — Anesthesia Postprocedure Evaluation (Signed)
  Anesthesia Post-op Note  Patient: [name]  Procedure(s) Performed: Procedure(s) (LRB): ROBOTIC ASSISTED SUPRACERVICAL HYSTERECTOMY (N/A) UNILATERAL SALPINGECTOMY (Left) CYSTOSCOPY (N/A)  Patient Location: PACU and Women's Unit  Anesthesia Type: General  Level of Consciousness: awake, alert  and oriented  Airway and Oxygen Therapy: Patient Spontanous Breathing  Post-op Pain: none  Post-op Assessment: Post-op Vital signs reviewed and Patient's Cardiovascular Status Stable  Post-op Vital Signs: Reviewed and stable  Complications: No apparent anesthesia complications

## 2012-05-20 NOTE — Progress Notes (Signed)
UR chart review completed.  

## 2012-05-20 NOTE — Progress Notes (Signed)
Pt rec'd via wheelchair from MAU.  Adm as op/obs to room 371 under services Dr. Normand Sloop.  Pt hx robotic hysterectomy 6/26.  Admits back to hospital with c/o edema, pain, numbness, tingling in left leg.  States walking painful and unable to bear weight, climb steps.   Transferred to bed, SR up x 2, oriented to room, VS obtained.  Plan of care discussed - plan for doppler studies 6/27 am at [facility].
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
## 2012-05-20 NOTE — Discharge Summary (Signed)
Physician Discharge Summary  Patient ID: [name] MRN: [id number] DOB/AGE: [birth year] 40 y.o.  Admit date: 05/20/2012 Discharge date: 05/20/2012  Admission Diagnoses:  S/p robotic-assisted supracervical hysterectomy, with lysis of adhesions and left salpingectomy--day 1                                          Post-op numbness in left leg.  Discharge Diagnoses:  Active Problems:  Nerve compression S/p robotic-assisted supracervical hysterectomy, with lysis of adhesions and left salpingectomy                                          Post-op numbness in left leg.    Discharged Condition: stable and improving  Hospital Course: Had robotic-assisted supracervical hysterectomy, with lysis of adhesions and left salpingectomy by Dr. Estanislado Pandy on 05/19/12.  Was d/c'd home same-day, and called the on-call CNM on the evening of 05/20/12 with numbness in left lower leg.  Patient was seen in MAU, and admitted overnight for observation, with doppler study scheduled for DVT evaluation today. By the morning of 05/20/12, patient was feeling better, with ability to ambulate without difficulty, and with numbness resolving in the left lower leg.  Dopper study was negative.  Vital signs remained stable, with stable Hgb.  Dr. Estanislado Pandy was consulted, and the patient was able to be discharged home.  She was instructed to continue Motrin ATC and to take a warm bath or shower to aid in muscle soreness.  She was counseled that the numbness was likely due to nerve compression due to positioning for surgery, a common side effect, and that the issue is expected to resolve spontaneously.  Patient was reassured and was discharged home in good condition.    Consults: None  Significant Diagnostic Studies: Doppler flow study of left leg--negative  Treatments: None  Discharge Exam: Blood pressure 120/64, pulse 71, temperature 99.1 F (37.3 C), temperature source Oral, resp. rate 18, height 5\' 3"  (1.6 m), weight 183 lb  (83.008 kg), last menstrual period 04/18/2012, SpO2 100.00%. General: alert  Resp: clear to auscultation bilaterally  Cardio: regular rate and rhythm, S1, S2 normal, no murmur, click, rub or gallop  GI: soft, non-tender; bowel sounds normal; no masses, no organomegaly  Extremities: Right leg WNL--no edema, normal sensation, good ROM. Left leg--no edema, DTR 2+ without clonus. Full ROM, negative Homan's. Slightly diminished sensation to touch in calf when compared to right leg.  Vaginal Bleeding: minimal, with small amount serosanguinous d/c, no odor.  Robotic incisions all CDI.  Disposition: 01-Home or Self Care  Discharge Orders    Future Appointments: Provider: Department: Dept Phone: Center:   06/02/2012 11:00 AM Esmeralda Arthur, MD [facility] [phone] None     Medication List  As of 05/20/2012 10:50 AM   TAKE these medications         albuterol 108 (90 BASE) MCG/ACT inhaler   Commonly known as: PROVENTIL HFA;VENTOLIN HFA   Inhale 2 puffs into the lungs every 4 (four) hours as needed. Shortness of breath and wheezing      hydrochlorothiazide 25 MG tablet   Commonly known as: HYDRODIURIL   Take 1 tablet (25 mg total) by mouth daily.      metoprolol 50 MG tablet   Commonly known as: LOPRESSOR  Take 1 tablet (50 mg total) by mouth 2 (two) times daily.      ONE-A-DAY WOMENS PO   Take 1 tablet by mouth daily.      oxyCODONE-acetaminophen 5-325 MG per tablet   Commonly known as: PERCOCET   Take 1 tablet by mouth every 4 (four) hours as needed for pain.      potassium chloride SA 20 MEQ tablet   Commonly known as: K-DUR,KLOR-CON   Take 40 mEq by mouth daily.      Vitamin D 1000 UNITS capsule   Take 2,000 Units by mouth daily.           Follow-up Information    Follow up with [facility], MD. (As scheduled or as needed)    Contact information:   [address] [place] [address] [phone]          Signed: Nigel Bridgeman 05/20/2012, 10:50 AM

## 2012-05-21 ENCOUNTER — Telehealth: Payer: Self-pay | Admitting: Obstetrics and Gynecology

## 2012-05-21 NOTE — Telephone Encounter (Signed)
Called pt Feeling better this morning. Back pain is improving and leg is feeling almost normal with return to normal sensitivity. No abdominal pain.minimal discharge. Voiding normally. Tolerating normal diet. Reassurance these symptoms are likely associated with prolonged Trendelenburg. Will follow in 2 weeks

## 2012-05-24 ENCOUNTER — Telehealth: Payer: Self-pay | Admitting: Obstetrics and Gynecology

## 2012-05-24 NOTE — Telephone Encounter (Signed)
Pt states she has taken all Dulcolax and some Magnesium.  Instructed to increase water. Try Colace otc and decrease pain meds if possible.  Notified that pain meds can worsen constipation. Told pt I would ask SR if any other things to try.  Pt agreeable.  ld

## 2012-05-24 NOTE — Telephone Encounter (Signed)
Try Miralax per SR for constipation.  ld

## 2012-05-24 NOTE — Telephone Encounter (Signed)
Recommend Miralax

## 2012-05-24 NOTE — Telephone Encounter (Signed)
[name]/gen.quest. 

## 2012-05-28 ENCOUNTER — Telehealth: Payer: Self-pay | Admitting: Obstetrics and Gynecology

## 2012-05-28 NOTE — Telephone Encounter (Signed)
Post op 1 week and now c/o abdominal cramping.  Some spotting this am. Now wearing panty liner. BMs regular.  Pt states she does not have fever.  Pt took oxycodone this am and helped back pain but did nothing for the abdominal cramps.    Per SL, pt is to try OTV simethacone chewable and Motrin 800mg  every 6-8 hours.  Can also use heating pad and be sure to stay well hydrated.  Call if worsens over the weekend and keep f/u appt on Wed 06-02-12.  Pt agreeable.  ld

## 2012-06-02 ENCOUNTER — Ambulatory Visit (INDEPENDENT_AMBULATORY_CARE_PROVIDER_SITE_OTHER): Payer: Managed Care, Other (non HMO) | Admitting: Obstetrics and Gynecology

## 2012-06-02 ENCOUNTER — Encounter: Payer: Self-pay | Admitting: Obstetrics and Gynecology

## 2012-06-02 VITALS — BP 114/68 | Wt 180.0 lb

## 2012-06-02 DIAGNOSIS — D259 Leiomyoma of uterus, unspecified: Secondary | ICD-10-CM

## 2012-06-02 DIAGNOSIS — D219 Benign neoplasm of connective and other soft tissue, unspecified: Secondary | ICD-10-CM

## 2012-06-02 DIAGNOSIS — M549 Dorsalgia, unspecified: Secondary | ICD-10-CM

## 2012-06-02 MED ORDER — CYCLOBENZAPRINE HCL 10 MG PO TABS: 10.0000 mg | ORAL_TABLET | Freq: Three times a day (TID) | ORAL | Status: DC | PRN

## 2012-06-02 NOTE — Progress Notes (Signed)
Surgery: 05/19/2012 Date: Robotic Supracervical Hysterectomy   Eating a regular diet without difficulty. Bowel movements are normal.  Pain is controlled with current analgesics. Medications being used: ibuprofen (OTC).  Bladder function is returned to normal. Vaginal bleeding: flow about like a period Vaginal discharge: no vaginal discharge  Pt is having concerns about her back is still hurting from the surgery : low back and tailbone pain   Subjective:     [name] is a 41 y.o. female who presents for post-op visit.  Pathology report  was reviewed with patient.  FINAL DIAGNOSIS Diagnosis Uterus, morcellated, and left fallopian tube - ENDOMETRIUM: INACTIVE. NO HYPERPLASIA OR CARCINOMA. - MYOMETRIUM: ADENOMYOSIS. LEIOMYOMATA. - LEFT FALLOPIAN TUBE: CHRONIC SALPINGITIS. NO ENDOMETRIOSIS OR EVIDENCE OF MALIGNANCY.  The following portions of the patient's history were reviewed and updated as appropriate: allergies, current medications, past family history, past medical history, past social history, past surgical history and problem list.  Review of Systems Pertinent items are noted in HPI.   Objective:    BP 114/68  Wt 180 lb (81.647 kg)  LMP 04/18/2012 Weight:  Wt Readings from Last 1 Encounters:  06/02/12 180 lb (81.647 kg)    BMI: There is no height on file to calculate BMI.  General Appearance: Alert, appropriate appearance for age. No acute distress Lungs: clear to auscultation bilaterally Back: No CVA tenderness Cardiovascular: Regular rate and rhythm. S1, S2, no murmur Gastrointestinal: Soft, non-tender, no masses or organomegaly Incision/s: healing well Pelvic Exam:Bimanual exam normal Normal cervix  Assessment:    Doing well postoperatively. Low back pain due to muscle spasm 2nd to surgical positioning Operative findings again reviewed.   Plan:    1. Continue any current medications. 2. Wound care discussed. 3. Anticipated return to work: 2-3 weeks  and to be evaluated at next visit. 4. Refer to Physical therapy and add Flexeril 5. Follow up: 2 weeks   Mustafa Potts A MD 7/10/201311:21 AM

## 2012-06-03 ENCOUNTER — Telehealth: Payer: Self-pay

## 2012-06-03 NOTE — Telephone Encounter (Signed)
FYI    [facility] for [facility]  [name] is accepted there and they will call pt for appt.  Referral done.  ld

## 2012-06-07 ENCOUNTER — Telehealth: Payer: Self-pay | Admitting: Obstetrics and Gynecology

## 2012-06-09 ENCOUNTER — Telehealth: Payer: Self-pay | Admitting: Obstetrics and Gynecology

## 2012-06-09 NOTE — Telephone Encounter (Signed)
LAURA/SR PT °

## 2012-06-10 NOTE — Telephone Encounter (Signed)
FYI       Pt states she had used some anti-itch cream around her incision sites and developed redness and bumps.  Suggested that pt stop using  cream and keep areas clean and dry.  Pt states she stopped cream Monday and it seems some better.  Pt to continue, and call if worsens.   ld

## 2012-06-16 ENCOUNTER — Ambulatory Visit (INDEPENDENT_AMBULATORY_CARE_PROVIDER_SITE_OTHER): Payer: Managed Care, Other (non HMO) | Admitting: Obstetrics and Gynecology

## 2012-06-16 ENCOUNTER — Encounter: Payer: Self-pay | Admitting: Obstetrics and Gynecology

## 2012-06-16 VITALS — BP 120/70 | Temp 98.0°F | Wt 182.0 lb

## 2012-06-16 DIAGNOSIS — T8189XA Other complications of procedures, not elsewhere classified, initial encounter: Secondary | ICD-10-CM

## 2012-06-16 DIAGNOSIS — M549 Dorsalgia, unspecified: Secondary | ICD-10-CM

## 2012-06-16 DIAGNOSIS — IMO0002 Reserved for concepts with insufficient information to code with codable children: Secondary | ICD-10-CM

## 2012-06-16 MED ORDER — CLOBETASOL PROPIONATE 0.05 % EX CREA: TOPICAL_CREAM | Freq: Two times a day (BID) | CUTANEOUS | Status: DC

## 2012-06-16 NOTE — Progress Notes (Signed)
40 YO S/P Robot Assisted  Supra-cervical Hysterectomy 05/19/12 was seen 06/02/12 with complaints of lower back pain and was sent to PT and given Flexeril to be taken with her NSAIDs.  She returns today for follow up and with complaints of rash around incision.  Was told that sacrum was out of alignment-given exercises to day, application of cold packs and sleeping with pillow between legs.  Patient's incisional rash is around each incision (for 1 week but has been itchy the entire time) is pigmented, maculopapular and pruritic.  O:  Abdomen: Mid-upper abdominal incision and right upper quadrant incision with pigmented maculopapular rash, no discharge or evidence of infection   A:  Back Pain-Sacral Malalignment-improving      Incisional Dermatitis ? Reaction to Glue  P Temovate Cream bid x 7 days      Continue back exercises and PT instructions      Note for 15 minute break that will allow patient to stand every 1.5 hours x 3 weeks       RTO-as scheduled  Florenda Watt, PA-C

## 2012-06-22 ENCOUNTER — Telehealth: Payer: Self-pay

## 2012-06-22 NOTE — Telephone Encounter (Signed)
Pt calling c/o cramping and possible blood in urine.  Cramping in lower abdomen area, much like menstrual cramping.  Taking ibuprofen 600mg  and is 6/10 without meds.  appt made for 07-07-12  ld

## 2012-07-07 ENCOUNTER — Encounter: Payer: Self-pay | Admitting: Obstetrics and Gynecology

## 2012-07-07 ENCOUNTER — Ambulatory Visit (INDEPENDENT_AMBULATORY_CARE_PROVIDER_SITE_OTHER): Payer: Managed Care, Other (non HMO) | Admitting: Obstetrics and Gynecology

## 2012-07-07 VITALS — BP 116/74 | Wt 185.0 lb

## 2012-07-07 DIAGNOSIS — R300 Dysuria: Secondary | ICD-10-CM

## 2012-07-07 DIAGNOSIS — D259 Leiomyoma of uterus, unspecified: Secondary | ICD-10-CM

## 2012-07-07 DIAGNOSIS — D219 Benign neoplasm of connective and other soft tissue, unspecified: Secondary | ICD-10-CM

## 2012-07-07 DIAGNOSIS — R319 Hematuria, unspecified: Secondary | ICD-10-CM

## 2012-07-07 LAB — POCT URINALYSIS DIPSTICK
Bilirubin, UA: 1
Clarity, UA: NEGATIVE
Glucose, UA: NEGATIVE
Ketones, UA: NEGATIVE
Spec Grav, UA: <=1.005

## 2012-07-07 NOTE — Progress Notes (Signed)
Contraception:Robotic  Hysterectomy 05/19/12 History of STD:  HSV 2  History of ovarian cyst: yes:   History of fibroids: yes:   History of endometriosis:no Previous ultrasound:yes:    Urinary symptoms: possible blood in urine  Gastro-intestinal symptoms:  Constipation: no     Diarrhea: no     Nausea: no     Vomiting: no     Fever: no Vaginal discharge: reddish in  color   Subjective:    [name] is a 41 y.o. female, G1P1, who presents for hematuria.. Menstrual cycle:   LMP: Patient's last menstrual period was 04/18/2012.  U/A: trace blood     The following portions of the patient's history were reviewed and updated as appropriate: allergies, current medications, past family history, past medical history, past social history, past surgical history and problem list.  Review of Systems Pertinent items are noted in HPI. Breast:Negative for breast lump,nipple discharge or nipple retraction Gastrointestinal: Negative for abdominal pain, change in bowel habits or rectal bleeding    Objective:    BP 116/74  Wt 185 lb (83.915 kg)  LMP 04/18/2012    Weight:  Wt Readings from Last 1 Encounters:  07/07/12 185 lb (83.915 kg)          BMI: There is no height on file to calculate BMI.  General Appearance: Alert, appropriate appearance for age. No acute distress Gastrointestinal: Soft, non-tender, no masses or organomegaly, incisions normal Pelvic Exam: Cervix: minimal bloody discharge, pelvic exam normal Rectovaginal: not indicated    Assessment:    bloody show from cervical stump    Plan:    return annually or prn Urine to culture Continue PT for back pain     Netty Sullivant AMD

## 2012-07-08 LAB — URINE CULTURE
Colony Count: NO GROWTH
Organism ID, Bacteria: NO GROWTH

## 2012-07-27 ENCOUNTER — Emergency Department (HOSPITAL_COMMUNITY): Payer: Managed Care, Other (non HMO)

## 2012-07-27 ENCOUNTER — Encounter (HOSPITAL_COMMUNITY): Payer: Self-pay

## 2012-07-27 ENCOUNTER — Emergency Department (HOSPITAL_COMMUNITY)
Admission: EM | Admit: 2012-07-27 | Discharge: 2012-07-27 | Disposition: A | Discharge status: Home / Self Care | Payer: Managed Care, Other (non HMO) | Attending: Emergency Medicine | Admitting: Emergency Medicine

## 2012-07-27 DIAGNOSIS — I10 Essential (primary) hypertension: Secondary | ICD-10-CM | POA: Insufficient documentation

## 2012-07-27 DIAGNOSIS — R509 Fever, unspecified: Secondary | ICD-10-CM | POA: Insufficient documentation

## 2012-07-27 DIAGNOSIS — Z90710 Acquired absence of both cervix and uterus: Secondary | ICD-10-CM | POA: Insufficient documentation

## 2012-07-27 DIAGNOSIS — R109 Unspecified abdominal pain: Secondary | ICD-10-CM | POA: Insufficient documentation

## 2012-07-27 DIAGNOSIS — M549 Dorsalgia, unspecified: Secondary | ICD-10-CM | POA: Insufficient documentation

## 2012-07-27 DIAGNOSIS — R110 Nausea: Secondary | ICD-10-CM | POA: Insufficient documentation

## 2012-07-27 DIAGNOSIS — R10819 Abdominal tenderness, unspecified site: Secondary | ICD-10-CM | POA: Insufficient documentation

## 2012-07-27 LAB — CBC WITH DIFFERENTIAL/PLATELET
Eosinophils Relative: 3 % (ref 0–5)
HCT: 35.7 % — ABNORMAL LOW (ref 36.0–46.0)
Hemoglobin: 12.3 g/dL (ref 12.0–15.0)
Lymphocytes Relative: 45 % (ref 12–46)
Lymphs Abs: 3.8 10*3/uL (ref 0.7–4.0)
MCV: 85.6 fL (ref 78.0–100.0)
Monocytes Relative: 6 % (ref 3–12)
Platelets: 314 10*3/uL (ref 150–400)
RBC: 4.17 MIL/uL (ref 3.87–5.11)
WBC: 8.4 10*3/uL (ref 4.0–10.5)

## 2012-07-27 LAB — URINE MICROSCOPIC-ADD ON

## 2012-07-27 LAB — COMPREHENSIVE METABOLIC PANEL
AST: 19 U/L (ref 0–37)
Albumin: 3.5 g/dL (ref 3.5–5.2)
BUN: 6 mg/dL (ref 6–23)
Calcium: 10.6 mg/dL — ABNORMAL HIGH (ref 8.4–10.5)
Creatinine, Ser: 0.76 mg/dL (ref 0.50–1.10)
Total Bilirubin: 0.4 mg/dL (ref 0.3–1.2)
Total Protein: 7.2 g/dL (ref 6.0–8.3)

## 2012-07-27 LAB — LIPASE, BLOOD: Lipase: 37 U/L (ref 11–59)

## 2012-07-27 LAB — URINALYSIS, ROUTINE W REFLEX MICROSCOPIC
Ketones, ur: NEGATIVE mg/dL
Nitrite: NEGATIVE
Protein, ur: NEGATIVE mg/dL
Urobilinogen, UA: 1 mg/dL (ref 0.0–1.0)

## 2012-07-27 MED ORDER — IOHEXOL 300 MG/ML  SOLN
100.0000 mL | Freq: Once | INTRAMUSCULAR | Status: AC | PRN
  Administered 2012-07-27: 100 mL via INTRAVENOUS

## 2012-07-27 MED ORDER — OXYCODONE-ACETAMINOPHEN 5-325 MG PO TABS: 1.0000 | ORAL_TABLET | Freq: Four times a day (QID) | ORAL | Status: AC | PRN

## 2012-07-27 MED ORDER — SODIUM CHLORIDE 0.9 % IV SOLN
INTRAVENOUS | Status: DC
  Administered 2012-07-27: 03:00:00 via INTRAVENOUS

## 2012-07-27 MED ORDER — ONDANSETRON HCL 4 MG/2ML IJ SOLN
4.0000 mg | Freq: Once | INTRAMUSCULAR | Status: AC
  Administered 2012-07-27: 4 mg via INTRAVENOUS
  Filled 2012-07-27: qty 2

## 2012-07-27 MED ORDER — IOHEXOL 300 MG/ML  SOLN: 20.0000 mL | INTRAMUSCULAR | Status: AC

## 2012-07-27 MED ORDER — PANTOPRAZOLE SODIUM 40 MG IV SOLR
40.0000 mg | Freq: Once | INTRAVENOUS | Status: AC
  Administered 2012-07-27: 40 mg via INTRAVENOUS
  Filled 2012-07-27: qty 40

## 2012-07-27 MED ORDER — HYDROMORPHONE HCL PF 1 MG/ML IJ SOLN
1.0000 mg | Freq: Once | INTRAMUSCULAR | Status: AC
  Administered 2012-07-27: 1 mg via INTRAVENOUS
  Filled 2012-07-27: qty 1

## 2012-07-27 MED ORDER — GI COCKTAIL ~~LOC~~
30.0000 mL | Freq: Once | ORAL | Status: AC
  Administered 2012-07-27: 30 mL via ORAL
  Filled 2012-07-27: qty 30

## 2012-07-27 NOTE — ED Notes (Signed)
Back from CT, no changes.  

## 2012-07-27 NOTE — ED Notes (Signed)
Back from xray, no changes, alert, NAD, calm, interactive, skin W&D, resps e/u, speaking in clear complete sentences, family at [facility].

## 2012-07-27 NOTE — ED Notes (Signed)
Up to restroom to obtain urine specimen  

## 2012-07-27 NOTE — ED Provider Notes (Addendum)
History     CSN: [id number]  Arrival date & time 07/27/12  0225   First MD Initiated Contact with Patient 07/27/12 0240      Chief Complaint  Patient presents with  . Abdominal Pain    mid abd pain - constant - with nausea - onset 2330 last PM; denies vaginal d/c nor urinary symptoms     (Consider location/radiation/quality/duration/timing/severity/associated sxs/prior treatment) Patient is a 41 y.o. female presenting with abdominal pain. The history is provided by the patient.  Abdominal Pain The primary symptoms of the illness include abdominal pain and nausea. The primary symptoms of the illness do not include fever, shortness of breath, vomiting, diarrhea or dysuria.  Additional symptoms associated with the illness include back pain. Symptoms associated with the illness do not include constipation.   41 year old, female, with a history of abdominal hysterectomy presents to emergency department complaining of abdominal pain, which radiates to her back.  Since approximately 9 PM.  She has had nausea without vomiting.  She denies diarrhea.  She denies cough, or shortness of breath.  She denies urinary tract symptoms.  She has had similar symptoms in the past, but they've been more mild.  She denies alcohol use.  She denies history of peptic ulcer disease.  Past Medical History  Diagnosis Date  . Allergy   . Asthma   . Hypertension     pulmonary  . Herpes genitalia   . H/O: myomectomy   . GERD (gastroesophageal reflux disease)     no meds  . Heart murmur     Past Surgical History  Procedure Date  . Myomectomy 2004      2010 robotic  . Ovarian cyst removal 2010    x2  . Breast lumpectomy 01/06/11  . Robotic assisted lap vaginal hysterectomy   . Cystoscopy 05/19/2012    Procedure: CYSTOSCOPY;  Surgeon: Esmeralda Arthur, MD;  Location: [facility];  Service: Gynecology;  Laterality: N/A;    Family History  Problem Relation Age of Onset  . Asthma Mother   . Arthritis Mother   .  Hypertension Father   . Heart disease Maternal Grandmother     PCI  . Cancer Maternal Grandmother 18    breast  . Cancer Other 60    breast and possible colon  . Diabetes Brother     History  Substance Use Topics  . Smoking status: Never Smoker   . Smokeless tobacco: Never Used  . Alcohol Use: No    OB History    Grav Para Term Preterm Abortions TAB SAB Ect Mult Living   1 1        1       Review of Systems  Constitutional: Negative for fever.  Respiratory: Negative for cough and shortness of breath.   Cardiovascular: Negative for chest pain.  Gastrointestinal: Positive for nausea and abdominal pain. Negative for vomiting, diarrhea and constipation.  Genitourinary: Negative for dysuria.  Musculoskeletal: Positive for back pain.  Neurological: Negative for headaches.  All other systems reviewed and are negative.    Allergies  Review of patient's allergies indicates no known allergies.  Home Medications   Current Outpatient Rx  Name Route Sig Dispense Refill  . ALBUTEROL SULFATE HFA 108 (90 BASE) MCG/ACT IN AERS Inhalation Inhale 2 puffs into the lungs every 4 (four) hours as needed. Shortness of breath and wheezing 1 Inhaler 1  . VITAMIN D 1000 UNITS PO CAPS Oral Take 2,000 Units by mouth daily.     Marland Kitchen  HYDROCHLOROTHIAZIDE 25 MG PO TABS Oral Take 1 tablet (25 mg total) by mouth daily. 90 tablet 3  . METOPROLOL TARTRATE 50 MG PO TABS Oral Take 1 tablet (50 mg total) by mouth 2 (two) times daily. 180 tablet 3  . ONE-A-DAY WOMENS PO Oral Take 1 tablet by mouth daily.    Marland Kitchen POTASSIUM CHLORIDE CRYS ER 20 MEQ PO TBCR Oral Take 40 mEq by mouth daily.      BP 133/85  Pulse 72  Temp 97.7 F (36.5 C) (Oral)  Resp 16  SpO2 98%  LMP 04/18/2012  Physical Exam  Nursing note and vitals reviewed. Constitutional: She is oriented to person, place, and time. No distress.       Obese uncomfortable appearing  HENT:  Head: Normocephalic and atraumatic.  Eyes: Conjunctivae and EOM  are normal.  Neck: Normal range of motion. Neck supple.  Cardiovascular: Normal rate and intact distal pulses.   No murmur heard. Pulmonary/Chest: Effort normal. She has no wheezes. She has no rales.  Abdominal: Soft. Bowel sounds are normal. She exhibits no distension. There is tenderness. There is no rebound and no guarding.       Epigastric and periumbilical tenderness, without peritoneal signs  Musculoskeletal: Normal range of motion.  Neurological: She is alert and oriented to person, place, and time.  Skin: Skin is warm and dry.  Psychiatric: She has a normal mood and affect. Thought content normal.    ED Course  Procedures (including critical care time) obese, female, with history of abdominal hysterectomy, presents with sudden onset of abdominal pain, and nausea.  No vomiting.  No diarrhea.  No urinary or respiratory symptoms.  She has abdominal tenderness.  I'm concerned first that she could have a small bowel obstruction, and then, also, hepatobiliary etiology for her symptoms.  I believe that peptic ulcer disease, aortic aneurysm, and urological causes of her symptoms are less likely.  We will give her IV analgesics, and antiemetics.  Perform an x-ray, and laboratory testing, for evaluation.  If necessary, CAT scan or a personally performed   Labs Reviewed  URINALYSIS, ROUTINE W REFLEX MICROSCOPIC  COMPREHENSIVE METABOLIC PANEL  CBC WITH DIFFERENTIAL  LIPASE, BLOOD   No results found.   No diagnosis found.  5:53 AM Pain improved but not gone.  Still tender.  Will consult surgery.  6:27 AM I spoke with Dr. Magnus Ivan.  He reviewed her chart, hx and the ct.  He said the fluid is most likely due to a ruptured ov.  He does not think she has a surgical problem.    I explained this to the pt along with plan.  She understands and agrees.  MDM  Abdominal pain, with nausea in patient with history of hysterectomy        Cheri Guppy, MD 07/27/12 0310  Cheri Guppy, MD 07/27/12 1610  Cheri Guppy, MD 07/27/12 [phone]

## 2012-07-29 ENCOUNTER — Telehealth: Payer: Self-pay

## 2012-07-29 NOTE — Telephone Encounter (Signed)
FYI    PC from pt stating she was seen in ER on 07-27-12. CT scan showed fluid in abdomen. MD stated he thought it was most likely a ruptured cyst.  She was given Percocet and taken OOW until today.  Doing much better now, and back to work.  Will call if worsens.  ld

## 2012-09-21 ENCOUNTER — Ambulatory Visit (INDEPENDENT_AMBULATORY_CARE_PROVIDER_SITE_OTHER): Payer: Managed Care, Other (non HMO) | Admitting: Family Medicine

## 2012-09-21 ENCOUNTER — Encounter: Payer: Self-pay | Admitting: Family Medicine

## 2012-09-21 VITALS — BP 114/76 | HR 64 | Temp 98.4°F | Wt 187.4 lb

## 2012-09-21 DIAGNOSIS — E876 Hypokalemia: Secondary | ICD-10-CM

## 2012-09-21 DIAGNOSIS — I10 Essential (primary) hypertension: Secondary | ICD-10-CM

## 2012-09-21 LAB — BASIC METABOLIC PANEL
BUN: 9 mg/dL (ref 6–23)
Calcium: 9.2 mg/dL (ref 8.4–10.5)
GFR: 100.24 mL/min (ref >60.00)
Glucose, Bld: 78 mg/dL (ref 70–99)
Sodium: 138 mEq/L (ref 135–145)

## 2012-09-21 MED ORDER — HYDROCHLOROTHIAZIDE 25 MG PO TABS: 25.0000 mg | ORAL_TABLET | Freq: Every day | ORAL | Status: DC

## 2012-09-21 MED ORDER — POTASSIUM CHLORIDE CRYS ER 20 MEQ PO TBCR: 40.0000 meq | EXTENDED_RELEASE_TABLET | Freq: Every day | ORAL | Status: DC

## 2012-09-21 MED ORDER — METOPROLOL TARTRATE 50 MG PO TABS: 50.0000 mg | ORAL_TABLET | Freq: Two times a day (BID) | ORAL | Status: DC

## 2012-09-21 NOTE — Progress Notes (Signed)
  Subjective:    Patient here for follow-up of elevated blood pressure.  She is exercising and is adherent to a low-salt diet.  Blood pressure is well controlled at home. Cardiac symptoms: none. Patient denies: chest pain, chest pressure/discomfort, claudication, dyspnea, exertional chest pressure/discomfort, fatigue, irregular heart beat, lower extremity edema, near-syncope, orthopnea, palpitations, paroxysmal nocturnal dyspnea, syncope and tachypnea. Cardiovascular risk factors: hypertension. Use of agents associated with hypertension: none. History of target organ damage: none.  The following portions of the patient's history were reviewed and updated as appropriate: allergies, current medications, past family history, past medical history, past social history, past surgical history and problem list.  Review of Systems Pertinent items are noted in HPI.     Objective:    BP 114/76  Pulse 64  Temp 98.4 F (36.9 C) (Oral)  Wt 187 lb 6.4 oz (85.004 kg)  SpO2 97%  LMP 04/18/2012 General appearance: alert, cooperative, appears stated age and no distress Lungs: clear to auscultation bilaterally Heart: S1, S2 normal Extremities: extremities normal, atraumatic, no cyanosis or edema    Assessment:    Hypertension, normal blood pressure . Evidence of target organ damage: none.    Plan:    Medication: no change. Dietary sodium restriction. Regular aerobic exercise. Follow up: 6 months and as needed.  Check bmp secondary to hx hypokalemia

## 2012-09-21 NOTE — Patient Instructions (Signed)

## 2012-10-14 ENCOUNTER — Other Ambulatory Visit: Payer: Self-pay | Admitting: Obstetrics and Gynecology

## 2012-10-14 DIAGNOSIS — Z1231 Encounter for screening mammogram for malignant neoplasm of breast: Secondary | ICD-10-CM

## 2012-10-15 ENCOUNTER — Telehealth: Payer: Self-pay | Admitting: Family Medicine

## 2012-10-15 NOTE — Telephone Encounter (Signed)
Patient Information:  Caller Name: Haeli  Phone: [phone]  Patient: [name], [name]  Gender: Female  DOB: Aug 16, 1971  Age: 41 Years  PCP: Lelon Perla.  Pregnant: No   Symptoms  Reason For Call & Symptoms: Left foot arch pain  Reviewed Health History In EMR: Yes  Reviewed Medications In EMR: Yes  Reviewed Allergies In EMR: Yes  Date of Onset of Symptoms: 10/08/2012  Treatments Tried: massaged with tennis ball, Ibuprofen  Treatments Tried Worked: No OB:  LMP: Unknown  Guideline(s) Used:  Foot Pain  Disposition Per Guideline:   See Today in Office  Reason For Disposition Reached:   Swollen foot (EXCEPTIONs: localized bump from bunions, calluses, insect bite, sting)  Advice Given:  Call Back If:  You become worse.  Pain Medicines:  For pain relief, you can take either acetaminophen, ibuprofen, or naproxen.  Office Follow Up:  Does the office need to follow up with this patient?: No  Instructions For The Office: N/A  Patient Refused Recommendation:  Patient Will Go To [facility].  Office is closed.  Patient agrees to follow up at Urgent care for evaluation  RN Note:  Moderate pain, that causes a limp. Left foot is redder than rt foot with moderate swelling however able to fit foot in shoe.  Patient is going to go to the Urgent care on [address] for evaluation.

## 2012-10-22 ENCOUNTER — Other Ambulatory Visit: Payer: Self-pay | Admitting: Obstetrics and Gynecology

## 2012-10-22 ENCOUNTER — Ambulatory Visit (HOSPITAL_BASED_OUTPATIENT_CLINIC_OR_DEPARTMENT_OTHER)
Admission: RE | Admit: 2012-10-22 | Discharge: 2012-10-22 | Disposition: A | Discharge status: Home / Self Care | Payer: Managed Care, Other (non HMO) | Source: Ambulatory Visit | Attending: Obstetrics and Gynecology | Admitting: Obstetrics and Gynecology

## 2012-10-22 DIAGNOSIS — Z1231 Encounter for screening mammogram for malignant neoplasm of breast: Secondary | ICD-10-CM

## 2012-11-09 ENCOUNTER — Emergency Department (HOSPITAL_COMMUNITY): Payer: Managed Care, Other (non HMO)

## 2012-11-09 ENCOUNTER — Ambulatory Visit (INDEPENDENT_AMBULATORY_CARE_PROVIDER_SITE_OTHER): Payer: Managed Care, Other (non HMO) | Admitting: Family Medicine

## 2012-11-09 ENCOUNTER — Observation Stay (HOSPITAL_COMMUNITY)
Admission: EM | Admit: 2012-11-09 | Discharge: 2012-11-10 | Disposition: A | Discharge status: Home / Self Care | Payer: Managed Care, Other (non HMO) | Attending: Internal Medicine | Admitting: Internal Medicine

## 2012-11-09 ENCOUNTER — Encounter: Payer: Self-pay | Admitting: Family Medicine

## 2012-11-09 VITALS — BP 120/80 | HR 78

## 2012-11-09 DIAGNOSIS — Z9889 Other specified postprocedural states: Secondary | ICD-10-CM

## 2012-11-09 DIAGNOSIS — D259 Leiomyoma of uterus, unspecified: Secondary | ICD-10-CM

## 2012-11-09 DIAGNOSIS — J45909 Unspecified asthma, uncomplicated: Secondary | ICD-10-CM

## 2012-11-09 DIAGNOSIS — R4789 Other speech disturbances: Secondary | ICD-10-CM | POA: Insufficient documentation

## 2012-11-09 DIAGNOSIS — I10 Essential (primary) hypertension: Secondary | ICD-10-CM | POA: Insufficient documentation

## 2012-11-09 DIAGNOSIS — K219 Gastro-esophageal reflux disease without esophagitis: Secondary | ICD-10-CM

## 2012-11-09 DIAGNOSIS — E876 Hypokalemia: Secondary | ICD-10-CM | POA: Diagnosis present

## 2012-11-09 DIAGNOSIS — R29898 Other symptoms and signs involving the musculoskeletal system: Secondary | ICD-10-CM | POA: Insufficient documentation

## 2012-11-09 DIAGNOSIS — I2789 Other specified pulmonary heart diseases: Secondary | ICD-10-CM

## 2012-11-09 DIAGNOSIS — R51 Headache: Secondary | ICD-10-CM | POA: Insufficient documentation

## 2012-11-09 DIAGNOSIS — R404 Transient alteration of awareness: Secondary | ICD-10-CM | POA: Insufficient documentation

## 2012-11-09 DIAGNOSIS — A6000 Herpesviral infection of urogenital system, unspecified: Secondary | ICD-10-CM

## 2012-11-09 DIAGNOSIS — F29 Unspecified psychosis not due to a substance or known physiological condition: Secondary | ICD-10-CM | POA: Insufficient documentation

## 2012-11-09 DIAGNOSIS — T148XXA Other injury of unspecified body region, initial encounter: Secondary | ICD-10-CM

## 2012-11-09 DIAGNOSIS — H9190 Unspecified hearing loss, unspecified ear: Secondary | ICD-10-CM

## 2012-11-09 DIAGNOSIS — G459 Transient cerebral ischemic attack, unspecified: Principal | ICD-10-CM | POA: Insufficient documentation

## 2012-11-09 DIAGNOSIS — B977 Papillomavirus as the cause of diseases classified elsewhere: Secondary | ICD-10-CM

## 2012-11-09 DIAGNOSIS — I6350 Cerebral infarction due to unspecified occlusion or stenosis of unspecified cerebral artery: Secondary | ICD-10-CM

## 2012-11-09 DIAGNOSIS — R4781 Slurred speech: Secondary | ICD-10-CM | POA: Diagnosis present

## 2012-11-09 DIAGNOSIS — I639 Cerebral infarction, unspecified: Secondary | ICD-10-CM

## 2012-11-09 DIAGNOSIS — E669 Obesity, unspecified: Secondary | ICD-10-CM | POA: Diagnosis present

## 2012-11-09 HISTORY — DX: Unspecified hearing loss, unspecified ear: H91.90

## 2012-11-09 HISTORY — DX: Transient cerebral ischemic attack, unspecified: G45.9

## 2012-11-09 HISTORY — DX: Procedure and treatment not carried out because of patient's decision for reasons of belief and group pressure: Z53.1

## 2012-11-09 LAB — POCT I-STAT, CHEM 8
Chloride: 103 mEq/L (ref 96–112)
HCT: 40 % (ref 36.0–46.0)
Hemoglobin: 13.6 g/dL (ref 12.0–15.0)
Potassium: 3.3 mEq/L — ABNORMAL LOW (ref 3.5–5.1)
Sodium: 140 mEq/L (ref 135–145)

## 2012-11-09 LAB — COMPREHENSIVE METABOLIC PANEL
ALT: 13 U/L (ref 0–35)
AST: 16 U/L (ref 0–37)
CO2: 25 mEq/L (ref 19–32)
Calcium: 9.3 mg/dL (ref 8.4–10.5)
Creatinine, Ser: 0.91 mg/dL (ref 0.50–1.10)
GFR calc Af Amer: 90 mL/min — ABNORMAL LOW (ref >90)
GFR calc non Af Amer: 77 mL/min — ABNORMAL LOW (ref >90)
Glucose, Bld: 90 mg/dL (ref 70–99)
Sodium: 136 mEq/L (ref 135–145)
Total Protein: 7 g/dL (ref 6.0–8.3)

## 2012-11-09 LAB — RAPID URINE DRUG SCREEN, HOSP PERFORMED
Benzodiazepines: NOT DETECTED
Cocaine: NOT DETECTED
Opiates: NOT DETECTED

## 2012-11-09 LAB — URINALYSIS, ROUTINE W REFLEX MICROSCOPIC
Bilirubin Urine: NEGATIVE
Glucose, UA: NEGATIVE mg/dL
Ketones, ur: NEGATIVE mg/dL
Specific Gravity, Urine: 1.008 (ref 1.005–1.030)
pH: 6.5 (ref 5.0–8.0)

## 2012-11-09 LAB — URINE MICROSCOPIC-ADD ON

## 2012-11-09 LAB — DIFFERENTIAL
Basophils Absolute: 0.1 10*3/uL (ref 0.0–0.1)
Eosinophils Absolute: 0.3 10*3/uL (ref 0.0–0.7)
Eosinophils Relative: 3 % (ref 0–5)
Lymphocytes Relative: 50 % — ABNORMAL HIGH (ref 12–46)
Monocytes Absolute: 0.5 10*3/uL (ref 0.1–1.0)

## 2012-11-09 LAB — CBC
HCT: 36.6 % (ref 36.0–46.0)
MCH: 28.5 pg (ref 26.0–34.0)
MCHC: 33.1 g/dL (ref 30.0–36.0)
MCV: 86.3 fL (ref 78.0–100.0)
Platelets: 271 10*3/uL (ref 150–400)
RDW: 13.3 % (ref 11.5–15.5)

## 2012-11-09 LAB — PROTIME-INR: Prothrombin Time: 14.2 seconds (ref 11.6–15.2)

## 2012-11-09 IMAGING — CT CT HEAD W/O CM
1 of 2 series · 16 of 30 positions shown, 20 images · non-contrast
Comparison: [DATE] and earlier

CLINICAL DATA: Code stroke.  Sudden onset of confusion.  Right-
sided weakness.

CT HEAD WITHOUT CONTRAST
TECHNIQUE: Contiguous axial images were obtained from the base of
the skull through the vertex without contrast.

[Series 2: (id) head 4.8 h37s st · axial · 0.48mm/px · z∈[-130,+27]mm · 16 of 36 slices shown, 20 images]
[im 2/36  brain]
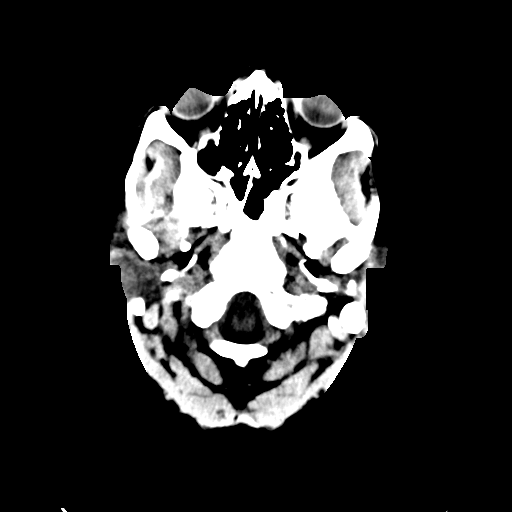
[im 2/36  bone]
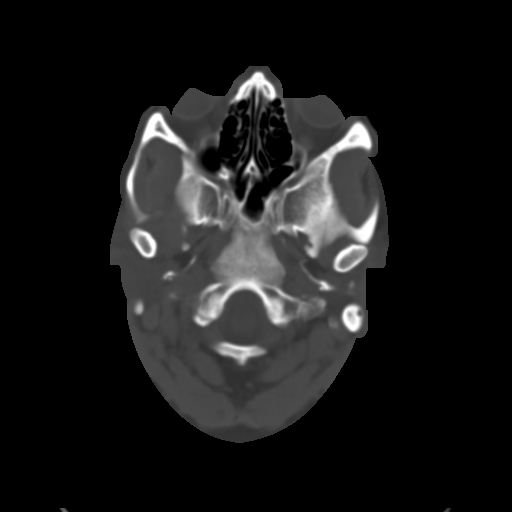
[im 4/36  brain]
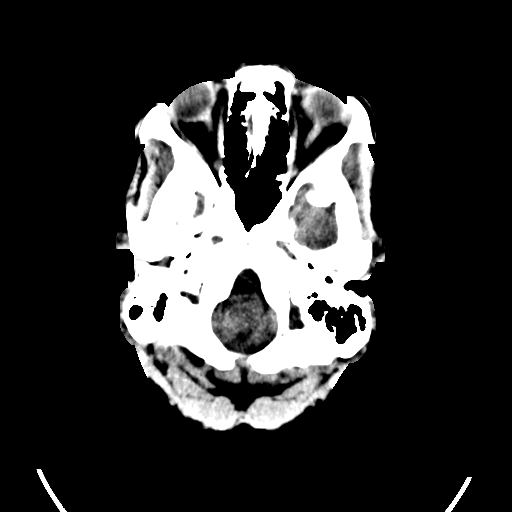
[im 7/36  brain]
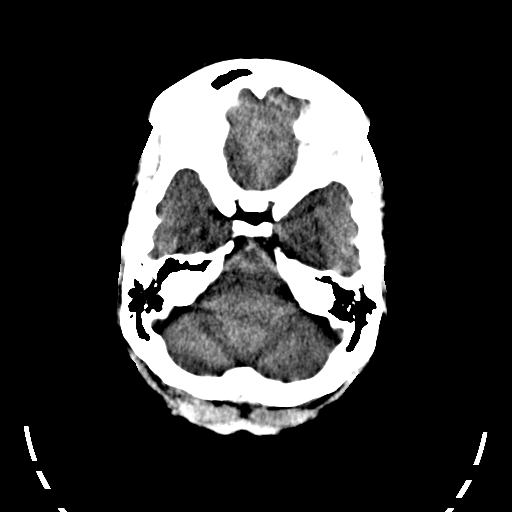
[im 9/36  brain]
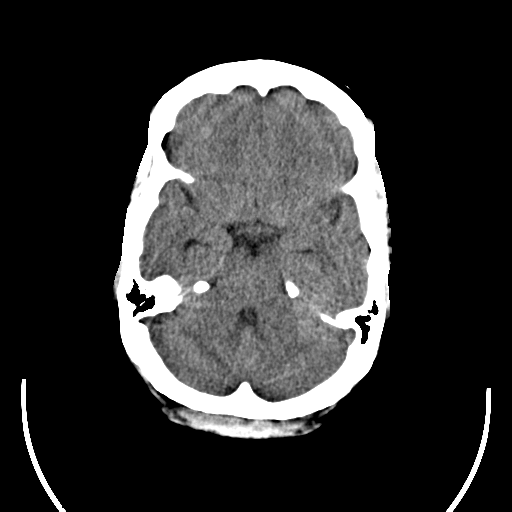
[im 11/36  brain]
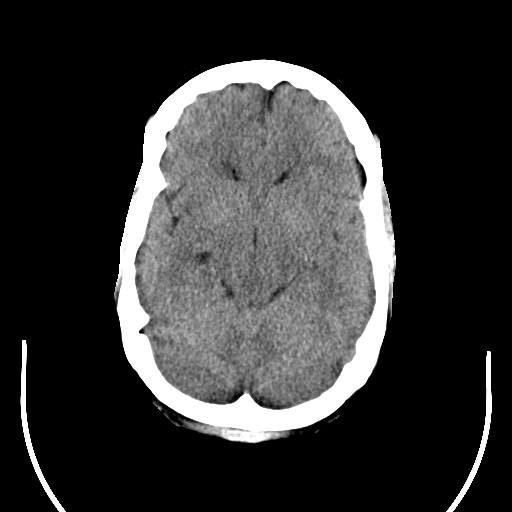
[im 11/36  bone]
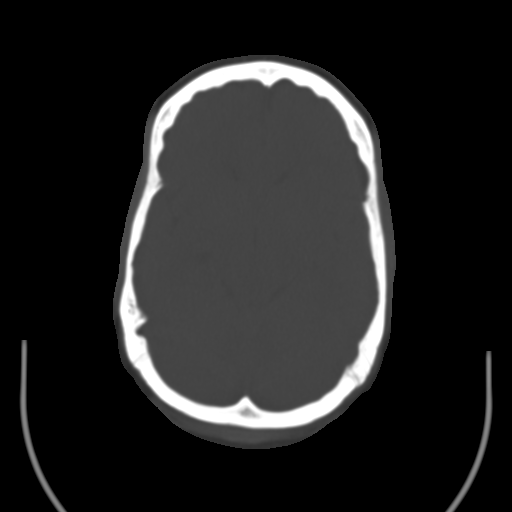
[im 12/36  brain]
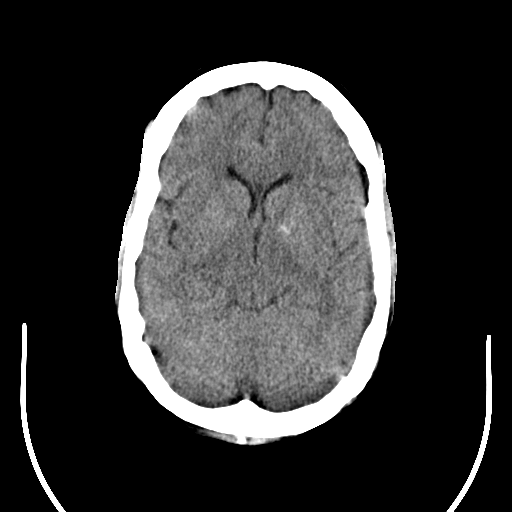
[im 16/36  brain]
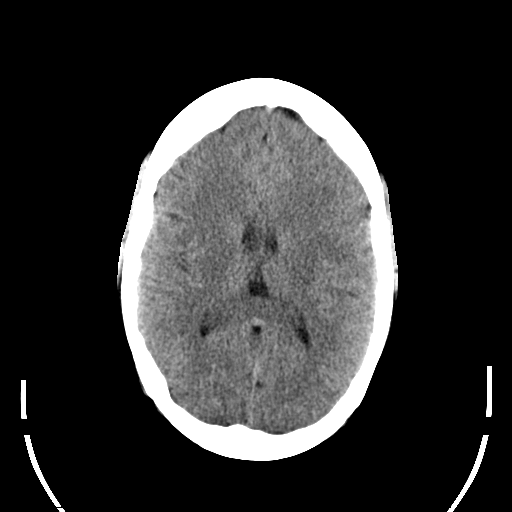
[im 17/36  brain]
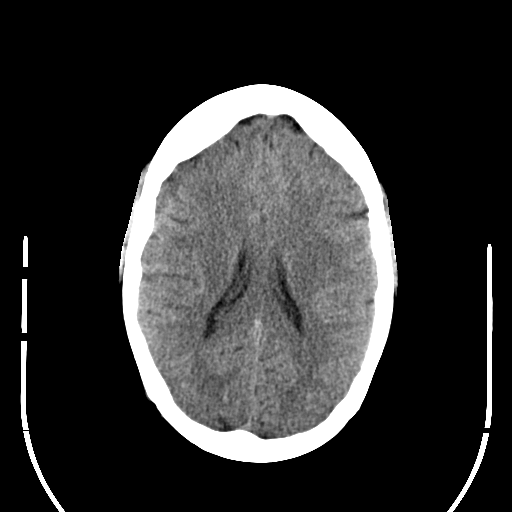
[im 19/36  brain]
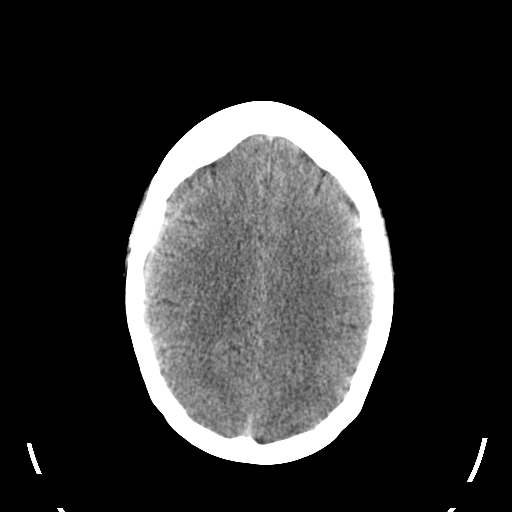
[im 19/36  bone]
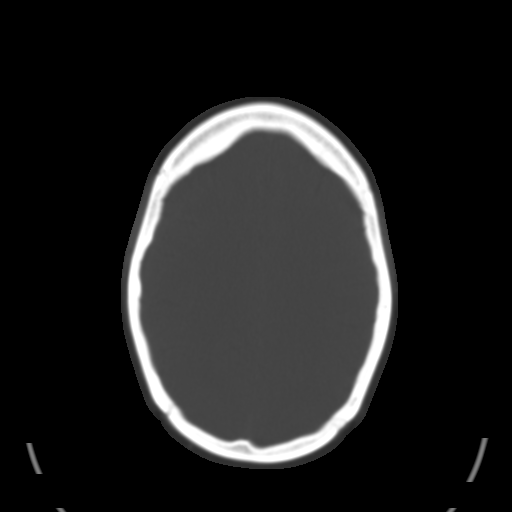
[im 21/36  brain]
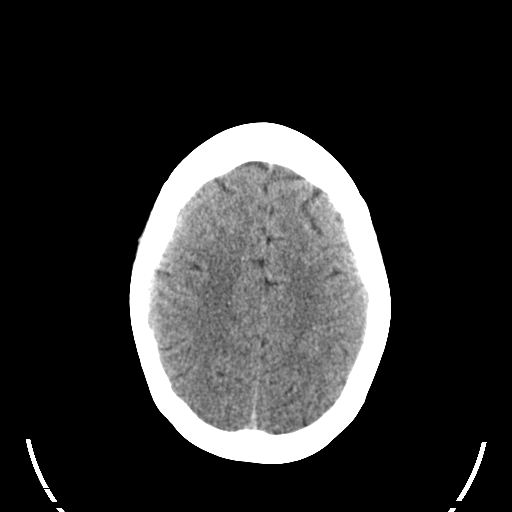
[im 24/36  brain]
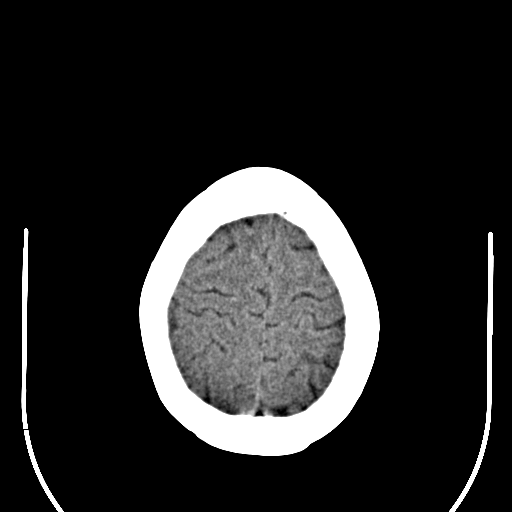
[im 26/36  brain]
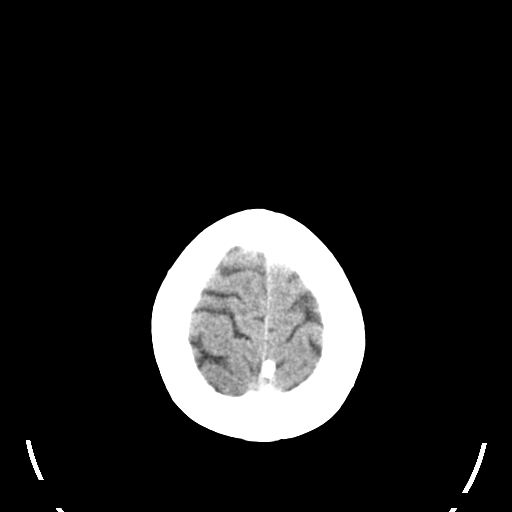
[im 27/36  brain]
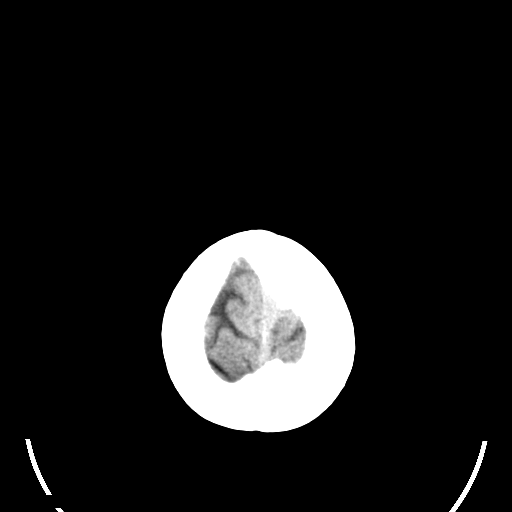
[im 27/36  bone]
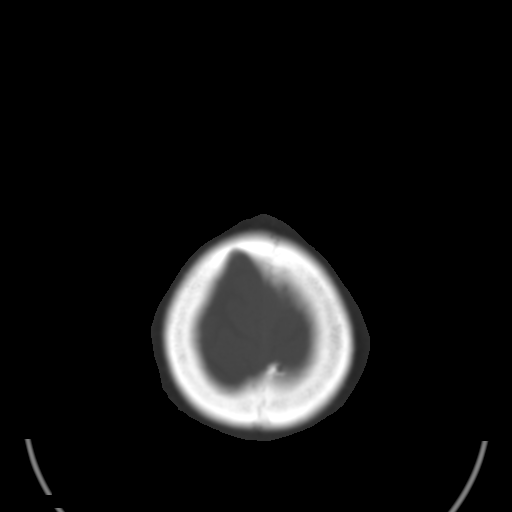
[im 29/36  brain]
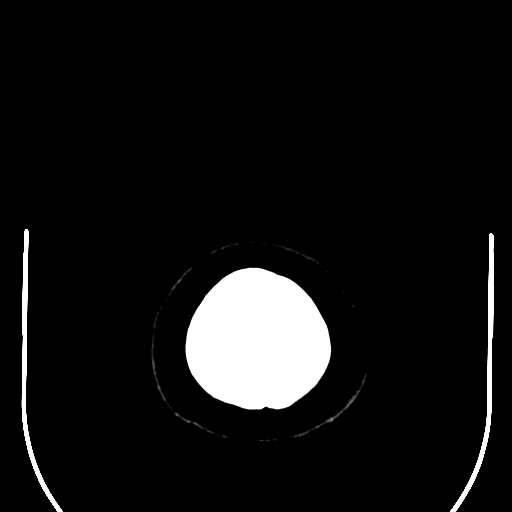
[im 32/36  brain]
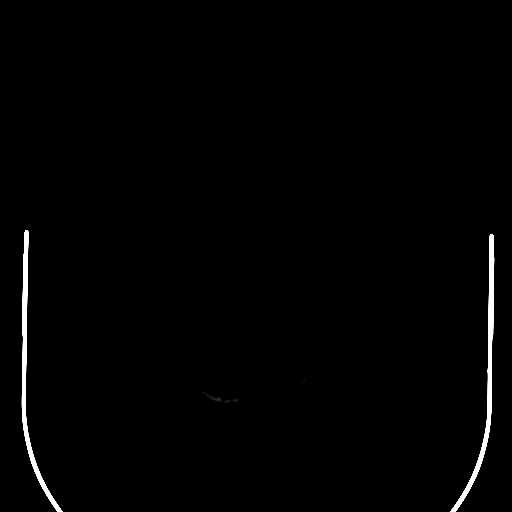
[im 34/36  brain]
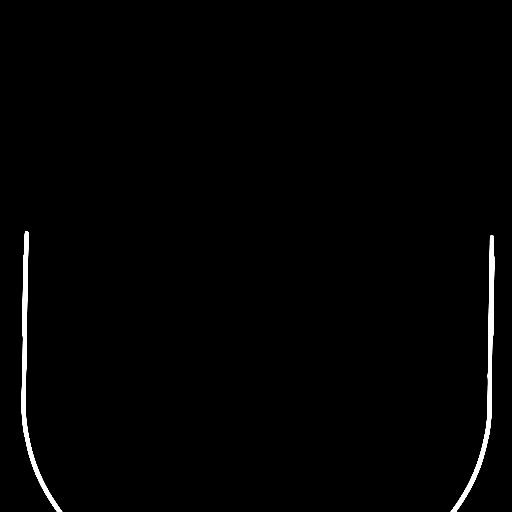

[16 of 30 positions shown; findings below may reference images not displayed]

FINDINGS: There is no intra or extra-axial fluid collection or mass
lesion.  The basilar cisterns and ventricles have a normal
appearance.  There is no CT evidence for acute infarction or
hemorrhage. As noted on prior study, there is focal calcification
within the globus pallidus, left greater than right.

Bone windows show no acute finding.
IMPRESSION: Negative exam.

Critical test results telephoned to Dr. TALISHA at the time of
interpretation on date [DATE] at time [DATE] p.m..

## 2012-11-09 MED ORDER — SODIUM CHLORIDE 0.9 % IV SOLN: 250.0000 mL | INTRAVENOUS | Status: DC | PRN

## 2012-11-09 MED ORDER — METOPROLOL TARTRATE 50 MG PO TABS
50.0000 mg | ORAL_TABLET | Freq: Two times a day (BID) | ORAL | Status: DC
  Administered 2012-11-09 – 2012-11-10 (×2): 50 mg via ORAL
  Filled 2012-11-09 (×3): qty 1

## 2012-11-09 MED ORDER — ONDANSETRON HCL 4 MG/2ML IJ SOLN: 4.0000 mg | Freq: Three times a day (TID) | INTRAMUSCULAR | Status: DC | PRN

## 2012-11-09 MED ORDER — SODIUM CHLORIDE 0.9 % IJ SOLN
3.0000 mL | Freq: Two times a day (BID) | INTRAMUSCULAR | Status: DC
  Administered 2012-11-09 – 2012-11-10 (×2): 3 mL via INTRAVENOUS

## 2012-11-09 MED ORDER — SODIUM CHLORIDE 0.9 % IV SOLN: INTRAVENOUS | Status: DC

## 2012-11-09 MED ORDER — POTASSIUM CHLORIDE CRYS ER 20 MEQ PO TBCR
40.0000 meq | EXTENDED_RELEASE_TABLET | Freq: Every day | ORAL | Status: DC
  Administered 2012-11-09 – 2012-11-10 (×2): 40 meq via ORAL
  Filled 2012-11-09 (×3): qty 2

## 2012-11-09 MED ORDER — ASPIRIN 325 MG PO TABS
325.0000 mg | ORAL_TABLET | Freq: Every day | ORAL | Status: DC
  Administered 2012-11-09 – 2012-11-10 (×2): 325 mg via ORAL
  Filled 2012-11-09 (×2): qty 1

## 2012-11-09 MED ORDER — POTASSIUM CHLORIDE 20 MEQ/15ML (10%) PO LIQD
40.0000 meq | Freq: Once | ORAL | Status: AC
  Administered 2012-11-09: 40 meq via ORAL
  Filled 2012-11-09: qty 30

## 2012-11-09 MED ORDER — HYDROMORPHONE HCL PF 1 MG/ML IJ SOLN
1.0000 mg | Freq: Once | INTRAMUSCULAR | Status: AC
  Administered 2012-11-09: 1 mg via INTRAVENOUS
  Filled 2012-11-09: qty 1

## 2012-11-09 MED ORDER — ONDANSETRON HCL 4 MG/2ML IJ SOLN: 4.0000 mg | Freq: Once | INTRAMUSCULAR | Status: DC

## 2012-11-09 MED ORDER — ONDANSETRON HCL 4 MG/2ML IJ SOLN
4.0000 mg | Freq: Once | INTRAMUSCULAR | Status: AC
  Administered 2012-11-09: 4 mg via INTRAVENOUS
  Filled 2012-11-09 (×2): qty 2

## 2012-11-09 MED ORDER — STROKE: EARLY STAGES OF RECOVERY BOOK
Freq: Once | Status: DC
  Filled 2012-11-09: qty 1

## 2012-11-09 MED ORDER — SODIUM CHLORIDE 0.9 % IJ SOLN: 3.0000 mL | INTRAMUSCULAR | Status: DC | PRN

## 2012-11-09 MED ORDER — HYDROCHLOROTHIAZIDE 25 MG PO TABS
25.0000 mg | ORAL_TABLET | Freq: Every day | ORAL | Status: DC
  Administered 2012-11-09: 25 mg via ORAL
  Filled 2012-11-09 (×2): qty 1

## 2012-11-09 NOTE — ED Notes (Signed)
Canceld CODE STROKE per Dr. Roseanne Reno

## 2012-11-09 NOTE — ED Notes (Signed)
Per ems- pt went to [facility] urgent care today c/o right arm weakness. Pt stated she had a h/a beginning at 1300 today, at 1400 began having right arm weakness and tingling. Pt Answers some questions inappropriately. Pt states she was 41 yo and just had a baby yesterday. Pt answered birthday correctly. Pt had positive right side arm drift, left grip>right grip. No weakness in legs. No facial droop, slurred speech CBG-104 HR-80 NSR, BP-140/80 R-16. IV started en route, 18g LAC.

## 2012-11-09 NOTE — ED Notes (Signed)
Dr. Roseanne Reno cancelled code stroke.

## 2012-11-09 NOTE — ED Provider Notes (Signed)
History     CSN: [id number]  Arrival date & time 11/09/12  1519   First MD Initiated Contact with Patient 11/09/12 1554      Chief Complaint  Patient presents with  . Code Stroke     (Consider location/radiation/quality/duration/timing/severity/associated sxs/prior treatment) The history is provided by the patient and medical records.    [name] is a 41 y.o. female  with a hx of HTN, headaches, heart murmur presents to the Emergency Department complaining of gradual, persistent, progressively worsening headache  symptoms onset last night. Patient had an increase in her headache approximately 1 PM today at 2 PM began having right arm weakness and tingling. She drove herself to her primary care physician where she was confused and had slurred speech. EMS was called and the code stroke was initiated. Associated symptoms include confusion, slurred speech.  Nothing makes it better and nothing makes it worse.  Pt denies fever, chills, neck pain, chest pain, shortness of breath, abdominal pain, nausea, vomiting, diarrhea, syncope.    PCP: Loreen Freud with [facility] [place]    Code stroke evaluation patient had positive right-sided arm drift and right grip weakness without noted weakness in the legs.  Patient states she feels as if this is beginning to resolve at this time.   Past Medical History  Diagnosis Date  . Allergy   . Asthma   . Hypertension     pulmonary  . Herpes genitalia   . H/O: myomectomy   . GERD (gastroesophageal reflux disease)     no meds  . Heart murmur     Past Surgical History  Procedure Date  . Myomectomy 2004      2010 robotic  . Ovarian cyst removal 2010    x2  . Breast lumpectomy 01/06/11  . Robotic assisted lap vaginal hysterectomy   . Cystoscopy 05/19/2012    Procedure: CYSTOSCOPY;  Surgeon: Esmeralda Arthur, MD;  Location: [facility];  Service: Gynecology;  Laterality: N/A;    Family History  Problem Relation Age of Onset  .  Asthma Mother   . Arthritis Mother   . Hypertension Father   . Heart disease Maternal Grandmother     PCI  . Cancer Maternal Grandmother 70    breast  . Cancer Other 60    breast and possible colon  . Diabetes Brother     History  Substance Use Topics  . Smoking status: Never Smoker   . Smokeless tobacco: Never Used  . Alcohol Use: No    OB History    Grav Para Term Preterm Abortions TAB SAB Ect Mult Living   1 1        1       Review of Systems  Constitutional: Negative for fever, diaphoresis, appetite change, fatigue and unexpected weight change.  HENT: Negative for ear pain, congestion, rhinorrhea, mouth sores, neck pain, neck stiffness, dental problem, postnasal drip and sinus pressure.   Eyes: Negative for visual disturbance.  Respiratory: Negative for cough, chest tightness, shortness of breath and wheezing.   Cardiovascular: Negative for chest pain.  Gastrointestinal: Negative for nausea, vomiting, abdominal pain, diarrhea and constipation.  Genitourinary: Negative for dysuria, urgency, frequency and hematuria.  Musculoskeletal: Negative for myalgias, back pain and joint swelling.  Skin: Negative for rash.  Neurological: Positive for speech difficulty, weakness, numbness and headaches. Negative for syncope and light-headedness.  Psychiatric/Behavioral: Negative for sleep disturbance. The patient is not nervous/anxious.   All other systems reviewed and are  negative.    Allergies  Review of patient's allergies indicates no known allergies.  Home Medications   Current Outpatient Rx  Name  Route  Sig  Dispense  Refill  . ALBUTEROL SULFATE HFA 108 (90 BASE) MCG/ACT IN AERS   Inhalation   Inhale 2 puffs into the lungs every 4 (four) hours as needed. Shortness of breath and wheezing   1 Inhaler   1   . VITAMIN D 1000 UNITS PO CAPS   Oral   Take 2,000 Units by mouth daily.          Marland Kitchen HYDROCHLOROTHIAZIDE 25 MG PO TABS   Oral   Take 1 tablet (25 mg total) by  mouth daily.   90 tablet   3   . METOPROLOL TARTRATE 50 MG PO TABS   Oral   Take 1 tablet (50 mg total) by mouth 2 (two) times daily.   180 tablet   3   . ONE-A-DAY WOMENS PO   Oral   Take 1 tablet by mouth daily.         Marland Kitchen POTASSIUM CHLORIDE CRYS ER 20 MEQ PO TBCR   Oral   Take 2 tablets (40 mEq total) by mouth daily.   190 tablet   3     BP 117/66  Pulse 70  Temp 98.3 F (36.8 C) (Oral)  Resp 18  SpO2 100%  LMP 04/18/2012  Physical Exam  Nursing note and vitals reviewed. Constitutional: She is oriented to person, place, and time. She appears well-developed and well-nourished. No distress.  HENT:  Head: Normocephalic and atraumatic.  Right Ear: Tympanic membrane, external ear and ear canal normal.  Left Ear: Tympanic membrane, external ear and ear canal normal.  Nose: Nose normal. Right sinus exhibits no maxillary sinus tenderness and no frontal sinus tenderness. Left sinus exhibits no maxillary sinus tenderness and no frontal sinus tenderness.  Mouth/Throat: Uvula is midline, oropharynx is clear and moist and mucous membranes are normal. No oropharyngeal exudate, posterior oropharyngeal edema, posterior oropharyngeal erythema or tonsillar abscesses.  Eyes: Conjunctivae normal and EOM are normal. Pupils are equal, round, and reactive to light. No scleral icterus.  Neck: Normal range of motion. Neck supple.  Cardiovascular: Normal rate, regular rhythm, normal heart sounds and intact distal pulses.  Exam reveals no gallop and no friction rub.   No murmur heard. Pulmonary/Chest: Effort normal and breath sounds normal. No respiratory distress. She has no wheezes. She has no rales. She exhibits no tenderness.  Abdominal: Soft. Bowel sounds are normal. She exhibits no distension and no mass. There is no tenderness. There is no rebound and no guarding.  Musculoskeletal: Normal range of motion. She exhibits no edema and no tenderness.  Neurological: She is alert and oriented  to person, place, and time. She displays normal reflexes. No cranial nerve deficit. She exhibits normal muscle tone. Coordination normal.       Speech is clear and goal oriented, follows commands Major Cranial nerves without deficit, no facial droop Normal strength in Left upper and lower extremities bilaterally including dorsiflexion and plantar flexion, strong and grip strength on L Mild weakness on Right upper extremity including decreased grip strength; normal strength on right lower extremity including dorsiflexion and plantarflexion Sensation normal to light and sharp touch Moves extremities without ataxia, coordination intact Normal finger to nose and rapid alternating movements Neg modified romberg, no pronator drift  Skin: Skin is warm and dry. No rash noted. She is not diaphoretic. No erythema.  Psychiatric:  She has a normal mood and affect.    ED Course  Procedures (including critical care time)  Labs Reviewed  DIFFERENTIAL - Abnormal; Notable for the following:    Neutrophils Relative 41 (*)     Lymphocytes Relative 50 (*)     Lymphs Abs 4.1 (*)     All other components within normal limits  COMPREHENSIVE METABOLIC PANEL - Abnormal; Notable for the following:    Potassium 3.3 (*)     Albumin 3.4 (*)     Total Bilirubin 0.2 (*)     GFR calc non Af Amer 77 (*)     GFR calc Af Amer 90 (*)     All other components within normal limits  POCT I-STAT, CHEM 8 - Abnormal; Notable for the following:    Potassium 3.3 (*)     All other components within normal limits  GLUCOSE, CAPILLARY - Abnormal; Notable for the following:    Glucose-Capillary 107 (*)     All other components within normal limits  URINALYSIS, ROUTINE W REFLEX MICROSCOPIC - Abnormal; Notable for the following:    APPearance CLOUDY (*)     Hgb urine dipstick SMALL (*)     Leukocytes, UA LARGE (*)     All other components within normal limits  URINE MICROSCOPIC-ADD ON - Abnormal; Notable for the following:     Squamous Epithelial / LPF MANY (*)     Bacteria, UA FEW (*)     All other components within normal limits  PROTIME-INR  APTT  CBC  TROPONIN I  URINE RAPID DRUG SCREEN (HOSP PERFORMED)  URINE CULTURE   Ct Head Wo Contrast  11/09/2012  *RADIOLOGY REPORT*  Clinical Data: Code stroke.  Sudden onset of confusion.  Right- sided weakness.  CT HEAD WITHOUT CONTRAST  Technique:  Contiguous axial images were obtained from the base of the skull through the vertex without contrast.  Comparison: 08/25/2010 and earlier  Findings: There is no intra or extra-axial fluid collection or mass lesion.  The basilar cisterns and ventricles have a normal appearance.  There is no CT evidence for acute infarction or hemorrhage. As noted on prior study, there is focal calcification within the globus pallidus, left greater than right.  Bone windows show no acute finding.  IMPRESSION: Negative exam.  Critical test results telephoned to Dr. Roseanne Reno at the time of interpretation on date 11/09/2012 at time 3:49 p.m.   Original Report Authenticated By: Norva Pavlov, M.D.    Mr Brain Wo Contrast  11/09/2012  *RADIOLOGY REPORT*  Clinical Data:  Right-sided hemiparesis, headache and delirium. Decreased sensation.  Improvement with only sensation loss remaining.  MRI BRAIN WITHOUT CONTRAST MRA HEAD WITHOUT CONTRAST  Technique: Multiplanar, multiecho pulse sequences of the brain and surrounding structures were obtained according to standard protocol without intravenous contrast.  Angiographic images of the head were obtained using MRA technique without contrast.  Comparison: 11/09/2012 CT.  08/02/2011 and 10/10/2008 MR brain. 10/10/2008 MR angiogram.  MRI HEAD  Findings:  No acute infarct.  Stable appearance of the minimal cystic encephalomalacia adjacent to the frontal horns more notable on the left which may reflect result of prior remote infarct/ischemia.  No intracranial mass lesion detected on this unenhanced exam.  No  hydrocephalus.  No intracranial hemorrhage.  Minimal nonspecific white matter type changes.  IMPRESSION: No acute abnormality.  Please see above.  MRA HEAD  Findings: Anterior circulation without medium or large size vessel significant stenosis or occlusion.  Azygos configuration A2 segment  anterior cerebral artery.  Left vertebral artery is dominant.  No significant stenosis of the basilar artery.  Poor delineation of the AICAs.  Poor delineation left superior cerebellar artery.  Mild branch vessel irregularity.  No aneurysm or vascular malformation noted.  IMPRESSION:  No medium or large size vessel significant stenosis or occlusion as noted above.  Mild branch vessel irregularity.   Original Report Authenticated By: Lacy Duverney, M.D.    Mr Mra Head/brain Wo Cm  11/09/2012  *RADIOLOGY REPORT*  Clinical Data:  Right-sided hemiparesis, headache and delirium. Decreased sensation.  Improvement with only sensation loss remaining.  MRI BRAIN WITHOUT CONTRAST MRA HEAD WITHOUT CONTRAST  Technique: Multiplanar, multiecho pulse sequences of the brain and surrounding structures were obtained according to standard protocol without intravenous contrast.  Angiographic images of the head were obtained using MRA technique without contrast.  Comparison: 11/09/2012 CT.  08/02/2011 and 10/10/2008 MR brain. 10/10/2008 MR angiogram.  MRI HEAD  Findings:  No acute infarct.  Stable appearance of the minimal cystic encephalomalacia adjacent to the frontal horns more notable on the left which may reflect result of prior remote infarct/ischemia.  No intracranial mass lesion detected on this unenhanced exam.  No hydrocephalus.  No intracranial hemorrhage.  Minimal nonspecific white matter type changes.  IMPRESSION: No acute abnormality.  Please see above.  MRA HEAD  Findings: Anterior circulation without medium or large size vessel significant stenosis or occlusion.  Azygos configuration A2 segment anterior cerebral artery.  Left  vertebral artery is dominant.  No significant stenosis of the basilar artery.  Poor delineation of the AICAs.  Poor delineation left superior cerebellar artery.  Mild branch vessel irregularity.  No aneurysm or vascular malformation noted.  IMPRESSION:  No medium or large size vessel significant stenosis or occlusion as noted above.  Mild branch vessel irregularity.   Original Report Authenticated By: Lacy Duverney, M.D.    ECG:  Date: 11/09/2012  Rate: 70  Rhythm: normal sinus rhythm  QRS Axis: normal  Intervals: normal  ST/T Wave abnormalities: normal  Conduction Disutrbances:none  Narrative Interpretation: non-ischemic ECG  Old EKG Reviewed: none available and unchanged  Results for orders placed during the hospital encounter of 11/09/12  PROTIME-INR      Component Value Range   Prothrombin Time 14.2  11.6 - 15.2 seconds   INR 1.11  0.00 - 1.49  APTT      Component Value Range   aPTT 35  24 - 37 seconds  CBC      Component Value Range   WBC 8.1  4.0 - 10.5 K/uL   RBC 4.24  3.87 - 5.11 MIL/uL   Hemoglobin 12.1  12.0 - 15.0 g/dL   HCT 21.3  08.6 - 57.8 %   MCV 86.3  78.0 - 100.0 fL   MCH 28.5  26.0 - 34.0 pg   MCHC 33.1  30.0 - 36.0 g/dL   RDW 46.9  62.9 - 52.8 %   Platelets 271  150 - 400 K/uL  DIFFERENTIAL      Component Value Range   Neutrophils Relative 41 (*) 43 - 77 %   Neutro Abs 3.3  1.7 - 7.7 K/uL   Lymphocytes Relative 50 (*) 12 - 46 %   Lymphs Abs 4.1 (*) 0.7 - 4.0 K/uL   Monocytes Relative 6  3 - 12 %   Monocytes Absolute 0.5  0.1 - 1.0 K/uL   Eosinophils Relative 3  0 - 5 %   Eosinophils Absolute 0.3  0.0 - 0.7 K/uL   Basophils Relative 1  0 - 1 %   Basophils Absolute 0.1  0.0 - 0.1 K/uL  COMPREHENSIVE METABOLIC PANEL      Component Value Range   Sodium 136  135 - 145 mEq/L   Potassium 3.3 (*) 3.5 - 5.1 mEq/L   Chloride 100  96 - 112 mEq/L   CO2 25  19 - 32 mEq/L   Glucose, Bld 90  70 - 99 mg/dL   BUN 10  6 - 23 mg/dL   Creatinine, Ser 6.96  0.50 -  1.10 mg/dL   Calcium 9.3  8.4 - 29.5 mg/dL   Total Protein 7.0  6.0 - 8.3 g/dL   Albumin 3.4 (*) 3.5 - 5.2 g/dL   AST 16  0 - 37 U/L   ALT 13  0 - 35 U/L   Alkaline Phosphatase 62  39 - 117 U/L   Total Bilirubin 0.2 (*) 0.3 - 1.2 mg/dL   GFR calc non Af Amer 77 (*) >90 mL/min   GFR calc Af Amer 90 (*) >90 mL/min  TROPONIN I      Component Value Range   Troponin I <0.30  <0.30 ng/mL  POCT I-STAT, CHEM 8      Component Value Range   Sodium 140  135 - 145 mEq/L   Potassium 3.3 (*) 3.5 - 5.1 mEq/L   Chloride 103  96 - 112 mEq/L   BUN 10  6 - 23 mg/dL   Creatinine, Ser 2.84  0.50 - 1.10 mg/dL   Glucose, Bld 92  70 - 99 mg/dL   Calcium, Ion 1.32  1.12 - 1.23 mmol/L   TCO2 26  0 - 100 mmol/L   Hemoglobin 13.6  12.0 - 15.0 g/dL   HCT 44.0  10.2 - 72.5 %  GLUCOSE, CAPILLARY      Component Value Range   Glucose-Capillary 107 (*) 70 - 99 mg/dL  URINE RAPID DRUG SCREEN (HOSP PERFORMED)      Component Value Range   Opiates NONE DETECTED  NONE DETECTED   Cocaine NONE DETECTED  NONE DETECTED   Benzodiazepines NONE DETECTED  NONE DETECTED   Amphetamines NONE DETECTED  NONE DETECTED   Tetrahydrocannabinol NONE DETECTED  NONE DETECTED   Barbiturates NONE DETECTED  NONE DETECTED  URINALYSIS, ROUTINE W REFLEX MICROSCOPIC      Component Value Range   Color, Urine YELLOW  YELLOW   APPearance CLOUDY (*) CLEAR   Specific Gravity, Urine 1.008  1.005 - 1.030   pH 6.5  5.0 - 8.0   Glucose, UA NEGATIVE  NEGATIVE mg/dL   Hgb urine dipstick SMALL (*) NEGATIVE   Bilirubin Urine NEGATIVE  NEGATIVE   Ketones, ur NEGATIVE  NEGATIVE mg/dL   Protein, ur NEGATIVE  NEGATIVE mg/dL   Urobilinogen, UA 1.0  0.0 - 1.0 mg/dL   Nitrite NEGATIVE  NEGATIVE   Leukocytes, UA LARGE (*) NEGATIVE  URINE MICROSCOPIC-ADD ON      Component Value Range   Squamous Epithelial / LPF MANY (*) RARE   WBC, UA 11-20  <3 WBC/hpf   RBC / HPF 3-6  <3 RBC/hpf   Bacteria, UA FEW (*) RARE   Urine-Other AMORPHOUS  URATES/PHOSPHATES     Ct Head Wo Contrast  11/09/2012  *RADIOLOGY REPORT*  Clinical Data: Code stroke.  Sudden onset of confusion.  Right- sided weakness.  CT HEAD WITHOUT CONTRAST  Technique:  Contiguous axial images were obtained from the base of the  skull through the vertex without contrast.  Comparison: 08/25/2010 and earlier  Findings: There is no intra or extra-axial fluid collection or mass lesion.  The basilar cisterns and ventricles have a normal appearance.  There is no CT evidence for acute infarction or hemorrhage. As noted on prior study, there is focal calcification within the globus pallidus, left greater than right.  Bone windows show no acute finding.  IMPRESSION: Negative exam.  Critical test results telephoned to Dr. Roseanne Reno at the time of interpretation on date 11/09/2012 at time 3:49 p.m.   Original Report Authenticated By: Norva Pavlov, M.D.    Mr Brain Wo Contrast  11/09/2012  *RADIOLOGY REPORT*  Clinical Data:  Right-sided hemiparesis, headache and delirium. Decreased sensation.  Improvement with only sensation loss remaining.  MRI BRAIN WITHOUT CONTRAST MRA HEAD WITHOUT CONTRAST  Technique: Multiplanar, multiecho pulse sequences of the brain and surrounding structures were obtained according to standard protocol without intravenous contrast.  Angiographic images of the head were obtained using MRA technique without contrast.  Comparison: 11/09/2012 CT.  08/02/2011 and 10/10/2008 MR brain. 10/10/2008 MR angiogram.  MRI HEAD  Findings:  No acute infarct.  Stable appearance of the minimal cystic encephalomalacia adjacent to the frontal horns more notable on the left which may reflect result of prior remote infarct/ischemia.  No intracranial mass lesion detected on this unenhanced exam.  No hydrocephalus.  No intracranial hemorrhage.  Minimal nonspecific white matter type changes.  IMPRESSION: No acute abnormality.  Please see above.  MRA HEAD  Findings: Anterior circulation without  medium or large size vessel significant stenosis or occlusion.  Azygos configuration A2 segment anterior cerebral artery.  Left vertebral artery is dominant.  No significant stenosis of the basilar artery.  Poor delineation of the AICAs.  Poor delineation left superior cerebellar artery.  Mild branch vessel irregularity.  No aneurysm or vascular malformation noted.  IMPRESSION:  No medium or large size vessel significant stenosis or occlusion as noted above.  Mild branch vessel irregularity.   Original Report Authenticated By: Lacy Duverney, M.D.    Mm Digital Screening  10/25/2012  *RADIOLOGY REPORT*  Clinical Data: Screening.  DIGITAL BILATERAL SCREENING MAMMOGRAM WITH CAD  The patient has  implants. Standard and implant displaced views were performed.  Comparison:  Previous exams.  Findings:  There are scattered fibroglandular densities. No suspicious masses, architectural distortion, or calcifications are present.  Images were processed with CAD.  IMPRESSION: No mammographic evidence of malignancy.  A result letter of this screening mammogram will be mailed directly to the patient.  RECOMMENDATION: Screening mammogram in one year. (Code:SM-B-01Y)  BI-RADS CATEGORY 1:  Negative.   Original Report Authenticated By: Baird Lyons, M.D.    Mr Mra Head/brain Wo Cm  11/09/2012  *RADIOLOGY REPORT*  Clinical Data:  Right-sided hemiparesis, headache and delirium. Decreased sensation.  Improvement with only sensation loss remaining.  MRI BRAIN WITHOUT CONTRAST MRA HEAD WITHOUT CONTRAST  Technique: Multiplanar, multiecho pulse sequences of the brain and surrounding structures were obtained according to standard protocol without intravenous contrast.  Angiographic images of the head were obtained using MRA technique without contrast.  Comparison: 11/09/2012 CT.  08/02/2011 and 10/10/2008 MR brain. 10/10/2008 MR angiogram.  MRI HEAD  Findings:  No acute infarct.  Stable appearance of the minimal cystic encephalomalacia  adjacent to the frontal horns more notable on the left which may reflect result of prior remote infarct/ischemia.  No intracranial mass lesion detected on this unenhanced exam.  No hydrocephalus.  No intracranial hemorrhage.  Minimal nonspecific white matter type changes.  IMPRESSION: No acute abnormality.  Please see above.  MRA HEAD  Findings: Anterior circulation without medium or large size vessel significant stenosis or occlusion.  Azygos configuration A2 segment anterior cerebral artery.  Left vertebral artery is dominant.  No significant stenosis of the basilar artery.  Poor delineation of the AICAs.  Poor delineation left superior cerebellar artery.  Mild branch vessel irregularity.  No aneurysm or vascular malformation noted.  IMPRESSION:  No medium or large size vessel significant stenosis or occlusion as noted above.  Mild branch vessel irregularity.   Original Report Authenticated By: Lacy Duverney, M.D.       1. TIA (transient ischemic attack)   2. Hypertension     CRITICAL CARE Performed by: Dierdre Forth   Total critical care time: 25 min  Critical care time was exclusive of separately billable procedures and treating other patients.  Critical care was necessary to treat or prevent imminent or life-threatening deterioration.  Critical care was time spent personally by me on the following activities: development of treatment plan with patient and/or surrogate as well as nursing, discussions with consultants, evaluation of patient's response to treatment, examination of patient, obtaining history from patient or surrogate, ordering and performing treatments and interventions, ordering and review of laboratory studies, ordering and review of radiographic studies, pulse oximetry and re-evaluation of patient's condition.   MDM  [name] presents as a code stroke with a headache.  The stroke called at 1505 and canceled at 1547 per Dr. Roseanne Reno after a negative CT scan.   Patient with resolving symptoms most likely a TIA. Patient has mild weakness in the right arm on my evaluation.  Patient has been seen by neurology who is recommending an inpatient admission for further TIA workup.    Urine contaminated with questionable UTI, troponin negative, CMP with mild hypokalemia which was replaced department, coags within normal limits, CBC unremarkable.  MRA with mild branch vessel irregularity but no significant stenosis or occlusion, MRI was negative.  On reevaluation, patient without focal neuro deficit, clear speech, and otherwise normal neurologic exam.   Patient to be admitted to [facility] for further TIA workup.      Dahlia Client Eirene Rather, PA-C 11/10/12 0130

## 2012-11-09 NOTE — H&P (Signed)
PCP:   Loreen Freud, DO   Chief Complaint:  Slurred speech  HPI: 41 yo female h/o htn with sudden slurred speech and rt arm weakness that lasted earlier today for over an hour but is now resolved.  Pt has no previous h/o stroke, mi or clotting disorder but does have a significant family h/o cva/mi at early age.  No fevers/n/v/d or recent illnesses.  No rashes.  No focal def now.  Takes no antiplatelet meds daily at home.  Neurology has already evaluated pt.  Mri is neg for stoke.    Review of Systems:  O/w neg  Past Medical History: Past Medical History  Diagnosis Date  . Allergy   . Asthma   . Hypertension     pulmonary  . Herpes genitalia   . H/O: myomectomy   . GERD (gastroesophageal reflux disease)     no meds  . Heart murmur    Past Surgical History  Procedure Date  . Myomectomy 2004      2010 robotic  . Ovarian cyst removal 2010    x2  . Breast lumpectomy 01/06/11  . Robotic assisted lap vaginal hysterectomy   . Cystoscopy 05/19/2012    Procedure: CYSTOSCOPY;  Surgeon: Esmeralda Arthur, MD;  Location: [facility];  Service: Gynecology;  Laterality: N/A;    Medications: Prior to Admission medications   Medication Sig Start Date End Date Taking? Authorizing Provider  albuterol (PROVENTIL HFA) 108 (90 BASE) MCG/ACT inhaler Inhale 2 puffs into the lungs every 4 (four) hours as needed. Shortness of breath and wheezing 10/09/11  Yes Lelon Perla, DO  Cholecalciferol (VITAMIN D) 1000 UNITS capsule Take 2,000 Units by mouth daily.    Yes Historical Provider, MD  hydrochlorothiazide (HYDRODIURIL) 25 MG tablet Take 1 tablet (25 mg total) by mouth daily. 09/21/12  Yes Yvonne R Lowne, DO  metoprolol (LOPRESSOR) 50 MG tablet Take 1 tablet (50 mg total) by mouth 2 (two) times daily. 09/21/12  Yes Lelon Perla, DO  Multiple Vitamins-Calcium (ONE-A-DAY WOMENS PO) Take 1 tablet by mouth daily.   Yes Historical Provider, MD  potassium chloride SA (K-DUR,KLOR-CON) 20 MEQ tablet Take 2  tablets (40 mEq total) by mouth daily. 09/21/12  Yes Lelon Perla, DO    Allergies:  No Known Allergies  Social History:  reports that she has never smoked. She has never used smokeless tobacco. She reports that she does not drink alcohol or use illicit drugs.  Family History: Family History  Problem Relation Age of Onset  . Asthma Mother   . Arthritis Mother   . Hypertension Father   . Heart disease Maternal Grandmother     PCI  . Cancer Maternal Grandmother 78    breast  . Cancer Other 60    breast and possible colon  . Diabetes Brother     Physical Exam: Filed Vitals:   11/09/12 1543 11/09/12 1614  BP: 117/66   Pulse: 70   Temp: 98.3 F (36.8 C) 98.3 F (36.8 C)  TempSrc: Oral   Resp: 18   SpO2: 100%    General appearance: alert, cooperative and no distress Neck: no JVD and supple, symmetrical, trachea midline Lungs: clear to auscultation bilaterally Heart: regular rate and rhythm, S1, S2 normal, no murmur, click, rub or gallop Abdomen: soft, non-tender; bowel sounds normal; no masses,  no organomegaly Extremities: extremities normal, atraumatic, no cyanosis or edema Pulses: 2+ and symmetric Skin: Skin color, texture, turgor normal. No rashes or lesions Neurologic:  Grossly normal    Labs on Admission:   [facility] 11/09/12 1532 11/09/12 1523  NA 140 136  K 3.3* 3.3*  CL 103 100  CO2 -- 25  GLUCOSE 92 90  BUN 10 10  CREATININE 0.90 0.91  CALCIUM -- 9.3  MG -- --  PHOS -- --    Basename 11/09/12 1523  AST 16  ALT 13  ALKPHOS 62  BILITOT 0.2*  PROT 7.0  ALBUMIN 3.4*    Basename 11/09/12 1532 11/09/12 1523  WBC -- 8.1  NEUTROABS -- 3.3  HGB 13.6 12.1  HCT 40.0 36.6  MCV -- 86.3  PLT -- 271    Basename 11/09/12 1524  CKTOTAL --  CKMB --  CKMBINDEX --  TROPONINI <0.30   Radiological Exams on Admission: Ct Head Wo Contrast  11/09/2012  *RADIOLOGY REPORT*  Clinical Data: Code stroke.  Sudden onset of confusion.  Right- sided  weakness.  CT HEAD WITHOUT CONTRAST  Technique:  Contiguous axial images were obtained from the base of the skull through the vertex without contrast.  Comparison: 08/25/2010 and earlier  Findings: There is no intra or extra-axial fluid collection or mass lesion.  The basilar cisterns and ventricles have a normal appearance.  There is no CT evidence for acute infarction or hemorrhage. As noted on prior study, there is focal calcification within the globus pallidus, left greater than right.  Bone windows show no acute finding.  IMPRESSION: Negative exam.  Critical test results telephoned to Dr. Roseanne Reno at the time of interpretation on date 11/09/2012 at time 3:49 p.m.   Original Report Authenticated By: Norva Pavlov, M.D.    Mr Brain Wo Contrast  11/09/2012  *RADIOLOGY REPORT*  Clinical Data:  Right-sided hemiparesis, headache and delirium. Decreased sensation.  Improvement with only sensation loss remaining.  MRI BRAIN WITHOUT CONTRAST MRA HEAD WITHOUT CONTRAST  Technique: Multiplanar, multiecho pulse sequences of the brain and surrounding structures were obtained according to standard protocol without intravenous contrast.  Angiographic images of the head were obtained using MRA technique without contrast.  Comparison: 11/09/2012 CT.  08/02/2011 and 10/10/2008 MR brain. 10/10/2008 MR angiogram.  MRI HEAD  Findings:  No acute infarct.  Stable appearance of the minimal cystic encephalomalacia adjacent to the frontal horns more notable on the left which may reflect result of prior remote infarct/ischemia.  No intracranial mass lesion detected on this unenhanced exam.  No hydrocephalus.  No intracranial hemorrhage.  Minimal nonspecific white matter type changes.  IMPRESSION: No acute abnormality.  Please see above.  MRA HEAD  Findings: Anterior circulation without medium or large size vessel significant stenosis or occlusion.  Azygos configuration A2 segment anterior cerebral artery.  Left vertebral artery is  dominant.  No significant stenosis of the basilar artery.  Poor delineation of the AICAs.  Poor delineation left superior cerebellar artery.  Mild branch vessel irregularity.  No aneurysm or vascular malformation noted.  IMPRESSION:  No medium or large size vessel significant stenosis or occlusion as noted above.  Mild branch vessel irregularity.   Original Report Authenticated By: Lacy Duverney, M.D.    Mr Mra Head/brain Wo Cm  11/09/2012  *RADIOLOGY REPORT*  Clinical Data:  Right-sided hemiparesis, headache and delirium. Decreased sensation.  Improvement with only sensation loss remaining.  MRI BRAIN WITHOUT CONTRAST MRA HEAD WITHOUT CONTRAST  Technique: Multiplanar, multiecho pulse sequences of the brain and surrounding structures were obtained according to standard protocol without intravenous contrast.  Angiographic images of the head were obtained using MRA technique  without contrast.  Comparison: 11/09/2012 CT.  08/02/2011 and 10/10/2008 MR brain. 10/10/2008 MR angiogram.  MRI HEAD  Findings:  No acute infarct.  Stable appearance of the minimal cystic encephalomalacia adjacent to the frontal horns more notable on the left which may reflect result of prior remote infarct/ischemia.  No intracranial mass lesion detected on this unenhanced exam.  No hydrocephalus.  No intracranial hemorrhage.  Minimal nonspecific white matter type changes.  IMPRESSION: No acute abnormality.  Please see above.  MRA HEAD  Findings: Anterior circulation without medium or large size vessel significant stenosis or occlusion.  Azygos configuration A2 segment anterior cerebral artery.  Left vertebral artery is dominant.  No significant stenosis of the basilar artery.  Poor delineation of the AICAs.  Poor delineation left superior cerebellar artery.  Mild branch vessel irregularity.  No aneurysm or vascular malformation noted.  IMPRESSION:  No medium or large size vessel significant stenosis or occlusion as noted above.  Mild branch  vessel irregularity.   Original Report Authenticated By: Lacy Duverney, M.D.     Assessment/Plan 41 yo female r/o tia  Principal Problem:  *TIA (transient ischemic attack) Active Problems:  Morbid obesity  HYPERTENSION  Slurred speech  Weakness of right upper extremity  ekg neg.  Ck carotids and cardiac echo.  Neuro cks overnight.  Asa full dose.  Neurology following.  No focal deficits at this time.    [name] A 11/09/2012, 7:33 PM

## 2012-11-09 NOTE — ED Notes (Signed)
Code Stroke encoded: 61 Code Stroke called: 1506 Patient arrival: 1519 EDP exam: 1519 Stroke Team arrival: 1517 Last seen normal:1300 Pt arrival in CT: 1521 Phlebotomist arrival: 1506 Code Stroke canceled: 1547 per Dr. Roseanne Reno.

## 2012-11-09 NOTE — Consult Note (Signed)
Referring Physician: Dr. Lynelle Doctor    Chief Complaint: headache, right arm weakness  HPI: [name] is an 41 y.o. female who developed a frontal headache today at 1300. She went to see her primary MD and subsequently developed right arm weakness and confusion. No noted seizure activity. She was sent to ED as code stroke.   Initially en route, EMS personal stated that she told them that she was 25yo with a baby that was 1 day old. Also that she was flaccid with the right upper extremity. BP reported as 140/86.  PMH significant for hypertension.  Date last known well: 11/09/2012 Time last known well: 1300  tPA Given: No: symptoms quickly resolving.  Past Medical History  Diagnosis Date  . Allergy   . Asthma   . Hypertension     pulmonary  . Herpes genitalia   . H/O: myomectomy   . GERD (gastroesophageal reflux disease)     no meds  . Heart murmur     Past Surgical History  Procedure Date  . Myomectomy 2004      2010 robotic  . Ovarian cyst removal 2010    x2  . Breast lumpectomy 01/06/11  . Robotic assisted lap vaginal hysterectomy   . Cystoscopy 05/19/2012    Procedure: CYSTOSCOPY;  Surgeon: Esmeralda Arthur, MD;  Location: [facility];  Service: Gynecology;  Laterality: N/A;    Family History  Problem Relation Age of Onset  . Asthma Mother   . Arthritis Mother   . Hypertension stroke Father    . Heart disease Maternal Grandmother     PCI  . Cancer Maternal Grandmother 63    breast  . Cancer Other 60    breast and possible colon  . Diabetes Brother    Social History:  reports that she has never smoked. She has never used smokeless tobacco. She reports that she does not drink alcohol or use illicit drugs.  Allergies: No Known Allergies  Current Facility-Administered Medications  Medication Dose Route Frequency Provider Last Rate Last Dose  . aspirin tablet 325 mg  325 mg Oral Daily [employer], PA-C      . HYDROmorphone (DILAUDID) injection 1 mg  1 mg  Intravenous Once Dierdre Forth, PA-C       Current Outpatient Prescriptions  Medication Sig Dispense Refill  . albuterol (PROVENTIL HFA) 108 (90 BASE) MCG/ACT inhaler Inhale 2 puffs into the lungs every 4 (four) hours as needed. Shortness of breath and wheezing  1 Inhaler  1  . Cholecalciferol (VITAMIN D) 1000 UNITS capsule Take 2,000 Units by mouth daily.       . hydrochlorothiazide (HYDRODIURIL) 25 MG tablet Take 1 tablet (25 mg total) by mouth daily.  90 tablet  3  . metoprolol (LOPRESSOR) 50 MG tablet Take 1 tablet (50 mg total) by mouth 2 (two) times daily.  180 tablet  3  . Multiple Vitamins-Calcium (ONE-A-DAY WOMENS PO) Take 1 tablet by mouth daily.      . potassium chloride SA (K-DUR,KLOR-CON) 20 MEQ tablet Take 2 tablets (40 mEq total) by mouth daily.  190 tablet  3    ROS: History obtained from the patient  General ROS: negative for - chills, fatigue, fever, night sweats, weight gain or weight loss   + frontal headache Psychological ROS: negative for - behavioral disorder, hallucinations, memory difficulties, mood swings or suicidal ideation Ophthalmic ROS: negative for - blurry vision, double vision, eye pain or loss of vision ENT ROS: negative for -  epistaxis, nasal discharge, oral lesions, sore throat, tinnitus or vertigo Allergy and Immunology ROS: negative for - hives or itchy/watery eyes Hematological and Lymphatic ROS: negative for - bleeding problems, bruising or swollen lymph nodes Endocrine ROS: negative for - galactorrhea, hair pattern changes, polydipsia/polyuria or temperature intolerance Respiratory ROS: negative for - cough, hemoptysis, shortness of breath or wheezing Cardiovascular ROS: negative for - chest pain, dyspnea on exertion, edema or irregular heartbeat Gastrointestinal ROS: negative for - abdominal pain, diarrhea, hematemesis, nausea/vomiting or stool incontinence Genito-Urinary ROS: negative for - dysuria, hematuria, incontinence or urinary  frequency/urgency Musculoskeletal ROS: negative for - joint swelling or muscular weakness Neurological ROS: as noted in HPI Dermatological ROS: negative for rash and skin lesion changes    Physical Examination: Blood pressure 117/66, pulse 70, temperature 98.3 F (36.8 C), temperature source Oral, resp. rate 18, last menstrual period 04/18/2012, SpO2 100.00%.  Neurologic Examination: Mental Status: Alert, oriented, thought content appropriate.  Speech fluent without evidence of aphasia.  Able to follow 3 step commands without difficulty. Cranial Nerves: II: visual fields grossly normal, pupils equal, round, reactive to light and accommodation III,IV, VI: ptosis not present, extra-ocular motions intact bilaterally V,VII: smile symmetric, facial light touch sensation normal bilaterally VIII: hearing normal bilaterally IX,X: gag reflex present XI: trapezius strength/neck flexion strength normal bilaterally XII: tongue strength normal  Motor: Right : Upper extremity   2/5    Left:     Upper extremity   5/5  Lower extremity   5/5     Lower extremity   5/5 Tone and bulk:normal tone throughout; no atrophy noted. Initially patient could not hold right arm up over head. This cleared over the next 20 minutes during our encounter.  Sensory: complained of decreased sensation on left body, difficulty with extinction Plantars: Right: downgoing   Left: downgoing Cerebellar: normal finger-to-nose. Did not test gait due to patients status, headache.    Results for orders placed during the hospital encounter of 11/09/12 (from the past 48 hour(s))  PROTIME-INR     Status: Normal   Collection Time   11/09/12  3:23 PM      Component Value Range Comment   Prothrombin Time 14.2  11.6 - 15.2 seconds    INR 1.11  0.00 - 1.49   APTT     Status: Normal   Collection Time   11/09/12  3:23 PM      Component Value Range Comment   aPTT 35  24 - 37 seconds   CBC     Status: Normal   Collection Time    11/09/12  3:23 PM      Component Value Range Comment   WBC 8.1  4.0 - 10.5 K/uL    RBC 4.24  3.87 - 5.11 MIL/uL    Hemoglobin 12.1  12.0 - 15.0 g/dL    HCT 40.9  81.1 - 91.4 %    MCV 86.3  78.0 - 100.0 fL    MCH 28.5  26.0 - 34.0 pg    MCHC 33.1  30.0 - 36.0 g/dL    RDW 78.2  95.6 - 21.3 %    Platelets 271  150 - 400 K/uL   DIFFERENTIAL     Status: Abnormal   Collection Time   11/09/12  3:23 PM      Component Value Range Comment   Neutrophils Relative 41 (*) 43 - 77 %    Neutro Abs 3.3  1.7 - 7.7 K/uL    Lymphocytes Relative 50 (*)  12 - 46 %    Lymphs Abs 4.1 (*) 0.7 - 4.0 K/uL    Monocytes Relative 6  3 - 12 %    Monocytes Absolute 0.5  0.1 - 1.0 K/uL    Eosinophils Relative 3  0 - 5 %    Eosinophils Absolute 0.3  0.0 - 0.7 K/uL    Basophils Relative 1  0 - 1 %    Basophils Absolute 0.1  0.0 - 0.1 K/uL   POCT I-STAT, CHEM 8     Status: Abnormal   Collection Time   11/09/12  3:32 PM      Component Value Range Comment   Sodium 140  135 - 145 mEq/L    Potassium 3.3 (*) 3.5 - 5.1 mEq/L    Chloride 103  96 - 112 mEq/L    BUN 10  6 - 23 mg/dL    Creatinine, Ser 8.46  0.50 - 1.10 mg/dL    Glucose, Bld 92  70 - 99 mg/dL    Calcium, Ion 9.62  1.12 - 1.23 mmol/L    TCO2 26  0 - 100 mmol/L    Hemoglobin 13.6  12.0 - 15.0 g/dL    HCT 95.2  84.1 - 32.4 %   GLUCOSE, CAPILLARY     Status: Abnormal   Collection Time   11/09/12  3:48 PM      Component Value Range Comment   Glucose-Capillary 107 (*) 70 - 99 mg/dL    Ct Head Wo Contrast  11/09/2012  *RADIOLOGY REPORT*  Clinical Data: Code stroke.  Sudden onset of confusion.  Right- sided weakness.  CT HEAD WITHOUT CONTRAST  Technique:  Contiguous axial images were obtained from the base of the skull through the vertex without contrast.  Comparison: 08/25/2010 and earlier  Findings: There is no intra or extra-axial fluid collection or mass lesion.  The basilar cisterns and ventricles have a normal appearance.  There is no CT evidence for  acute infarction or hemorrhage. As noted on prior study, there is focal calcification within the globus pallidus, left greater than right.  Bone windows show no acute finding.  IMPRESSION: Negative exam.  Critical test results telephoned to Dr. Roseanne Reno at the time of interpretation on date 11/09/2012 at time 3:49 p.m.   Original Report Authenticated By: Norva Pavlov, M.D.     Assessment: 41 y.o. female with acute onset of headache at 1300 with right hemiparesis, acute delirium and decreased sensation (hemi-sensory). Now with only sensation loss. Will continue with stroke workup due to risk factors. May treat headache aggressively.  Stroke Risk Factors - hypertension  Plan: 1. HgbA1c, fasting lipid panel 2. MRI, MRA  of the brain without contrast 3. PT consult, OT consult, Speech consult 4. Echocardiogram 5. Carotid dopplers 6. Prophylactic therapy-Antiplatelet med: Aspirin - dose 325 7. Risk factor modification 8. Telemetry monitoring 9. Frequent neuro checks   Gwendolyn Lima. Manson Passey, PAC, MBA, MHA [facility] Pager: [phone]  This patient was personally evaluated by me and I proved the above clinical assessment and management recommendations.  Venetia Maxon M.D. [facility] [phone]  11/09/2012 4:09 PM

## 2012-11-09 NOTE — ED Provider Notes (Signed)
Patient reports she had a headache earlier today. She states she's having trouble concentrating and had blurred vision. She states she had nausea and felt off balance. She reports she decided to go home because she couldn't stay at work however she forgot something and had to go back to work. When she got back in her car she felt like her right arm was not connected to her body and she could not feel it. She also states she could not use her arm. She describes the headache as a pounding jackhammer type pain. She states she has a history of headaches but nothing like this before. Patient has history of strokes at early age in her family.  Patient has no focal neurological deficit.  Patient has been seen by neurology who feel she needs to be admitted as inpatient. Patient waiting on MR results to call for admission.   Medical screening examination/treatment/procedure(s) were conducted as a shared visit with non-physician practitioner(s) and myself.  I personally evaluated the patient during the encounter  Devoria Albe, MD, Franz Dell, MD 11/10/12 [phone]

## 2012-11-10 ENCOUNTER — Encounter (HOSPITAL_COMMUNITY): Payer: Self-pay | Admitting: General Practice

## 2012-11-10 DIAGNOSIS — E876 Hypokalemia: Secondary | ICD-10-CM | POA: Diagnosis present

## 2012-11-10 DIAGNOSIS — I369 Nonrheumatic tricuspid valve disorder, unspecified: Secondary | ICD-10-CM

## 2012-11-10 LAB — HEMOGLOBIN A1C
Hgb A1c MFr Bld: 5.1 % (ref <5.7)
Mean Plasma Glucose: 100 mg/dL (ref <117)

## 2012-11-10 LAB — LIPID PANEL
HDL: 52 mg/dL (ref >39)
Triglycerides: 124 mg/dL (ref <150)

## 2012-11-10 LAB — URINE CULTURE: Culture: NO GROWTH

## 2012-11-10 LAB — MAGNESIUM: Magnesium: 1.8 mg/dL (ref 1.5–2.5)

## 2012-11-10 MED ORDER — ASPIRIN 325 MG PO TABS: 325.0000 mg | ORAL_TABLET | Freq: Every day | ORAL | Status: AC

## 2012-11-10 MED ORDER — IBUPROFEN 400 MG PO TABS
400.0000 mg | ORAL_TABLET | Freq: Four times a day (QID) | ORAL | Status: DC | PRN
  Administered 2012-11-10: 400 mg via ORAL
  Filled 2012-11-10: qty 1

## 2012-11-10 NOTE — Progress Notes (Signed)
Patient seen and examined. Agree with note by Toya Smothers, NP. Patient was admitted with right arm weakness, slurred speech and confusion that resolved prior to her coming to the ED. MRI is negative for CVA. This likely represents a TIA. Await results of  ECHO/doppler. Should be able to restart HCTZ prior to DC. May be able to DC home later today if ECHO/dopplers, PT evals completed.  Peggye Pitt, MD [facility] Pager: [phone]

## 2012-11-10 NOTE — Progress Notes (Signed)
Utilization review completed.  

## 2012-11-10 NOTE — Discharge Summary (Signed)
Physician Discharge Summary  Patient ID: CHIDINMA CLITES MRN: [id number] DOB/AGE: 08/14/1971 41 y.o.  Admit date: 11/09/2012 Discharge date: 11/10/2012  Primary Care Physician:  Loreen Freud, DO   Discharge Diagnoses:    Principal Problem:  *TIA (transient ischemic attack) Active Problems:  Morbid obesity  HYPERTENSION  Slurred speech  Weakness of right upper extremity  Hypokalemia      Medication List     As of 11/10/2012  4:27 PM    TAKE these medications         albuterol 108 (90 BASE) MCG/ACT inhaler   Commonly known as: PROVENTIL HFA;VENTOLIN HFA   Inhale 2 puffs into the lungs every 4 (four) hours as needed. Shortness of breath and wheezing      aspirin 325 MG tablet   Take 1 tablet (325 mg total) by mouth daily.      hydrochlorothiazide 25 MG tablet   Commonly known as: HYDRODIURIL   Take 1 tablet (25 mg total) by mouth daily.      metoprolol 50 MG tablet   Commonly known as: LOPRESSOR   Take 1 tablet (50 mg total) by mouth 2 (two) times daily.      ONE-A-DAY WOMENS PO   Take 1 tablet by mouth daily.      potassium chloride SA 20 MEQ tablet   Commonly known as: K-DUR,KLOR-CON   Take 2 tablets (40 mEq total) by mouth daily.      Vitamin D 1000 UNITS capsule   Take 2,000 Units by mouth daily.         Disposition and Follow-up:  Will be discharged home today in stable and improved condition. Has been advised to follow up with her PCP in 2 weeks.  Consults:  Neurology, Dr. Pearlean Brownie.   Significant Diagnostic Studies:  Ct Head Wo Contrast  11/09/2012  *RADIOLOGY REPORT*  Clinical Data: Code stroke.  Sudden onset of confusion.  Right- sided weakness.  CT HEAD WITHOUT CONTRAST  Technique:  Contiguous axial images were obtained from the base of the skull through the vertex without contrast.  Comparison: 08/25/2010 and earlier  Findings: There is no intra or extra-axial fluid collection or mass lesion.  The basilar cisterns and ventricles have a normal  appearance.  There is no CT evidence for acute infarction or hemorrhage. As noted on prior study, there is focal calcification within the globus pallidus, left greater than right.  Bone windows show no acute finding.  IMPRESSION: Negative exam.  Critical test results telephoned to Dr. Roseanne Reno at the time of interpretation on date 11/09/2012 at time 3:49 p.m.   Original Report Authenticated By: Norva Pavlov, M.D.    Mr Brain Wo Contrast  11/09/2012  *RADIOLOGY REPORT*  Clinical Data:  Right-sided hemiparesis, headache and delirium. Decreased sensation.  Improvement with only sensation loss remaining.  MRI BRAIN WITHOUT CONTRAST MRA HEAD WITHOUT CONTRAST  Technique: Multiplanar, multiecho pulse sequences of the brain and surrounding structures were obtained according to standard protocol without intravenous contrast.  Angiographic images of the head were obtained using MRA technique without contrast.  Comparison: 11/09/2012 CT.  08/02/2011 and 10/10/2008 MR brain. 10/10/2008 MR angiogram.  MRI HEAD  Findings:  No acute infarct.  Stable appearance of the minimal cystic encephalomalacia adjacent to the frontal horns more notable on the left which may reflect result of prior remote infarct/ischemia.  No intracranial mass lesion detected on this unenhanced exam.  No hydrocephalus.  No intracranial hemorrhage.  Minimal nonspecific white matter type changes.  IMPRESSION:  No acute abnormality.  Please see above.  MRA HEAD  Findings: Anterior circulation without medium or large size vessel significant stenosis or occlusion.  Azygos configuration A2 segment anterior cerebral artery.  Left vertebral artery is dominant.  No significant stenosis of the basilar artery.  Poor delineation of the AICAs.  Poor delineation left superior cerebellar artery.  Mild branch vessel irregularity.  No aneurysm or vascular malformation noted.  IMPRESSION:  No medium or large size vessel significant stenosis or occlusion as noted above.   Mild branch vessel irregularity.   Original Report Authenticated By: Lacy Duverney, M.D.    Mr Mra Head/brain Wo Cm  11/09/2012  *RADIOLOGY REPORT*  Clinical Data:  Right-sided hemiparesis, headache and delirium. Decreased sensation.  Improvement with only sensation loss remaining.  MRI BRAIN WITHOUT CONTRAST MRA HEAD WITHOUT CONTRAST  Technique: Multiplanar, multiecho pulse sequences of the brain and surrounding structures were obtained according to standard protocol without intravenous contrast.  Angiographic images of the head were obtained using MRA technique without contrast.  Comparison: 11/09/2012 CT.  08/02/2011 and 10/10/2008 MR brain. 10/10/2008 MR angiogram.  MRI HEAD  Findings:  No acute infarct.  Stable appearance of the minimal cystic encephalomalacia adjacent to the frontal horns more notable on the left which may reflect result of prior remote infarct/ischemia.  No intracranial mass lesion detected on this unenhanced exam.  No hydrocephalus.  No intracranial hemorrhage.  Minimal nonspecific white matter type changes.  IMPRESSION: No acute abnormality.  Please see above.  MRA HEAD  Findings: Anterior circulation without medium or large size vessel significant stenosis or occlusion.  Azygos configuration A2 segment anterior cerebral artery.  Left vertebral artery is dominant.  No significant stenosis of the basilar artery.  Poor delineation of the AICAs.  Poor delineation left superior cerebellar artery.  Mild branch vessel irregularity.  No aneurysm or vascular malformation noted.  IMPRESSION:  No medium or large size vessel significant stenosis or occlusion as noted above.  Mild branch vessel irregularity.   Original Report Authenticated By: Lacy Duverney, M.D.    Carotid duplex completed.  Preliminary report: Bilateral: No evidence of hemodynamically significant internal carotid artery stenosis. Vertebral artery flow is antegrade.   2D ECHO:  Study Conclusions  - Left ventricle: The  cavity size was normal. Wall thickness was increased in a pattern of mild LVH. The estimated ejection fraction was 60%. Regional wall motion abnormalities cannot be excluded. - Right ventricle: The cavity size was mildly dilated. Systolic function was mildly reduced. - Right atrium: The atrium was mildly dilated. - Pulmonary arteries: PA peak pressure: 34mm Hg (S). - Impressions: No cardiac source of embolism was identified, but cannot be ruled out on the basis of this examination  Brief H and P: For complete details please refer to admission H and P, but in brief patient is a 41 yo female h/o htn with sudden slurred speech and rt arm weakness that lasted earlier today for over an hour but is now resolved. Pt has no previous h/o stroke, mi or clotting disorder but does have a significant family h/o cva/mi at early age. No fevers/n/v/d or recent illnesses. No rashes. No focal def now. Takes no antiplatelet meds daily at home. Neurology has already evaluated pt. Mri is neg for stoke. We were asked to admit her for further evaluation and management.     Hospital Course:  Principal Problem:  *TIA (transient ischemic attack) Active Problems:  Morbid obesity  HYPERTENSION  Slurred speech  Weakness of right  upper extremity  Hypokalemia   TIA -MRI was negative for CVA. -All stroke workup was within normal limits (see above for details). -Has been advised to take ASA 325 mg daily as primary stroke prophylaxis.  Rest of chronic medical issues have been stable and her home medications have not been altered.   Time spent on Discharge: Greater than 30 minutes.  SignedChaya Jan [facility] Pager: [phone] 11/10/2012, 4:27 PM

## 2012-11-10 NOTE — Progress Notes (Signed)
Stroke Team Progress Note  HISTORY [name] is an 41 y.o. female who developed a frontal headache today 11/09/2012 at 1300. She went to see her primary MD and subsequently developed right arm weakness and confusion. No noted seizure activity. She was sent to ED as code stroke. Initially en route, EMS personal stated that she told them that she was 25yo with a baby that was 1 day old. Also that she was flaccid with the right upper extremity. BP reported as 140/86. PMH significant for hypertension.Patient was not a TPA candidate secondary to quick resolution of symtpoms. She was admitted for further evaluation and treatment.  SUBJECTIVE No family is at the bedside.  Overall she feels her condition is gradually improving. She has a history of headaches. Has seen a neurologist in the past at [facility] (Dr. Terrace Arabia).  OBJECTIVE Most recent Vital Signs: Filed Vitals:   11/09/12 2108 11/10/12 0000 11/10/12 0526 11/10/12 0800  BP: 127/75  102/66 99/64  Pulse: 66  63 73  Temp: 98.6 F (37 C)  98.4 F (36.9 C) 98.7 F (37.1 C)  TempSrc: Oral Oral Oral Oral  Resp: 15  15 16   Height: 5\' 3"  (1.6 m)     Weight: 83.9 kg (184 lb 15.5 oz)  83.6 kg (184 lb 4.9 oz)   SpO2: 100%  100% 99%   CBG (last 3)   Basename 11/09/12 1548  GLUCAP 107*    IV Fluid Intake:     MEDICATIONS    .  stroke: mapping our early stages of recovery book   Does not apply Once  . aspirin  325 mg Oral Daily  . metoprolol  50 mg Oral BID  . potassium chloride SA  40 mEq Oral Daily  . sodium chloride  3 mL Intravenous Q12H   PRN:  sodium chloride, sodium chloride  Diet:  Cardiac thin liquids Activity:  OOB with assistance DVT Prophylaxis:  SCDs   CLINICALLY SIGNIFICANT STUDIES Basic Metabolic Panel:  Lab 11/09/12 5784 11/09/12 1523  NA 140 136  K 3.3* 3.3*  CL 103 100  CO2 -- 25  GLUCOSE 92 90  BUN 10 10  CREATININE 0.90 0.91  CALCIUM -- 9.3  MG -- --  PHOS -- --   Liver Function Tests:  Lab  11/09/12 1523  AST 16  ALT 13  ALKPHOS 62  BILITOT 0.2*  PROT 7.0  ALBUMIN 3.4*   CBC:  Lab 11/09/12 1532 11/09/12 1523  WBC -- 8.1  NEUTROABS -- 3.3  HGB 13.6 12.1  HCT 40.0 36.6  MCV -- 86.3  PLT -- 271   Coagulation:  Lab 11/09/12 1523  LABPROT 14.2  INR 1.11   Cardiac Enzymes:  Lab 11/09/12 1524  CKTOTAL --  CKMB --  CKMBINDEX --  TROPONINI <0.30   Urinalysis:  Lab 11/09/12 1611  COLORURINE YELLOW  LABSPEC 1.008  PHURINE 6.5  GLUCOSEU NEGATIVE  HGBUR SMALL*  BILIRUBINUR NEGATIVE  KETONESUR NEGATIVE  PROTEINUR NEGATIVE  UROBILINOGEN 1.0  NITRITE NEGATIVE  LEUKOCYTESUR LARGE*   Lipid Panel    Component Value Date/Time   CHOL 130 11/10/2012 0500   TRIG 124 11/10/2012 0500   HDL 52 11/10/2012 0500   CHOLHDL 2.5 11/10/2012 0500   VLDL 25 11/10/2012 0500   LDLCALC 53 11/10/2012 0500   HgbA1C  Lab Results  Component Value Date   HGBA1C  Value: 4.9 (NOTE)   The ADA recommends the following therapeutic goal for glycemic   control related to  Hgb A1C measurement:   Goal of Therapy:   < 7.0% Hgb A1C   Reference: American Diabetes Association: Clinical Practice   Recommendations 2008, Diabetes Care,  2008, 31:(Suppl 1). 10/10/2008    Urine Drug Screen:     Component Value Date/Time   LABOPIA NONE DETECTED 11/09/2012 1611   COCAINSCRNUR NONE DETECTED 11/09/2012 1611   LABBENZ NONE DETECTED 11/09/2012 1611   AMPHETMU NONE DETECTED 11/09/2012 1611   THCU NONE DETECTED 11/09/2012 1611   LABBARB NONE DETECTED 11/09/2012 1611    Alcohol Level: No results found for this basename: ETH:2 in the last 168 hours   CT of the brain  11/09/2012   Negative exam.    MRI of the brain  11/09/2012 No acute abnormality.   MRA of the brain  11/09/2012    No medium or large size vessel significant stenosis or occlusion as noted above.  Mild branch vessel irregularity.  2D Echocardiogram    Carotid Doppler    CXR    EKG  normal sinus rhythm.   Therapy  Recommendations   Physical Exam   Young african american lady not in distress.Awake alert. Afebrile. Head is nontraumatic. Neck is supple without bruit. Hearing is normal. Cardiac exam no murmur or gallop. Lungs are clear to auscultation. Distal pulses are well felt.  Neurological Exam : Awake  Alert oriented x 3. Normal speech and language.eye movements full without nystagmus.fundi not visualized. Vision acuity and fields appear normal. Face symmetric. Tongue midline. Normal strength, tone, reflexes and coordination. Normal sensation. Gait deferred.  ASSESSMENT Ms. [name] is a 41 y.o. female presenting with right arm weakness and confusion. Imaging confirms no acute  infarct. TIA vs migraine. Work up underway. On no antiplatelets prior to admission. Now on aspirin 325 mg orally every day for secondary stroke prevention.   Hypertension, pulmonary GERD Hx headaches, etiology not confirmed, followed by Dr. Dillon Bjork day # 1  TREATMENT/PLAN  Continue aspirin 325 mg orally every day for secondary stroke prevention.  F/u 2D and carotid doppler  Ok for discharge once workup completed Follow up with Dr. Pearlean Brownie, Stroke Clinic, in 2 months.  Annie Main, MSN, RN, ANVP-BC, ANP-BC, [facility] Pager: [phone] 11/10/2012 9:45 AM  I have personally obtained a history, examined the patient, evaluated imaging results, and formulated the assessment and plan of care. I agree with the above.    Delia Heady, MD Medical Director [facility] Pager: [phone] 11/10/2012 2:29 PM

## 2012-11-10 NOTE — Progress Notes (Signed)
  Echocardiogram 2D Echocardiogram has been performed.  [name] 11/10/2012, 11:24 AM

## 2012-11-10 NOTE — Progress Notes (Signed)
  Subjective:    Patient ID: [name], female    DOB: [birth year], 40 y.o.   MRN: [id number]  HPI Pt walked in c/o arm numbness and slurred speech.  When she arrived in the back she was talking about getting her baby (she does not have a baby) and withing minutes she was unable to answer questions at all.  911 was called during this time and came and brought pt to ER at [facility].   EKg was done and sent with them as well.  Pt mother was informed.  SHe requested we call her mom when she arrived.   Review of Systems     Objective:   Physical Exam        Assessment & Plan:

## 2012-11-10 NOTE — Evaluation (Signed)
Physical Therapy Evaluation Patient Details Name: [name] MRN: [id number] DOB: 12-24-1970 Today's Date: 11/10/2012 Time: 1478-2956 PT Time Calculation (min): 22 min  PT Assessment / Plan / Recommendation Clinical Impression  Pt admitted after experiencing sudden slurred speech and right arm weakness on 11/09/12 and has since resolved.  Pt presents at baseline today and does not need PT at this time. Will sign off at this time.     PT Assessment  Patent does not need any further PT services    Follow Up Recommendations  No PT follow up          Equipment Recommendations  None recommended by PT          Precautions / Restrictions Precautions Precautions: None Restrictions Weight Bearing Restrictions: No   Pertinent Vitals/Pain VSS, some pain; headache, reported to RN      Mobility  Bed Mobility Bed Mobility: Supine to Sit;Sitting - Scoot to Edge of Bed;Sit to Supine Supine to Sit: 7: Independent Sitting - Scoot to Edge of Bed: 7: Independent Sit to Supine: 7: Independent Details for Bed Mobility Assistance: No difficulties Transfers Transfers: Sit to Stand;Stand to Sit Sit to Stand: 7: Independent;With upper extremity assist;With armrests;From bed;From chair/3-in-1 Stand to Sit: 7: Independent;With upper extremity assist;To bed;To chair/3-in-1 Details for Transfer Assistance: No difficulties, denies dizziness or lightheadedness Ambulation/Gait Ambulation/Gait Assistance: 6: Modified independent (Device/Increase time) Ambulation Distance (Feet): 500 Feet Assistive device: None Ambulation/Gait Assistance Details: Pt with slightly decr gait velocity, wide base of support which pt reports is her baseline.  No difficulties holding conversation, scanning environment, and turning around tight spaces. Gait Pattern: Step-through pattern;Wide base of support Gait velocity: decr Stairs: Yes Stairs Assistance: 6: Modified independent (Device/Increase time) Stair  Management Technique: One rail Right Number of Stairs: 10  (full flight) Wheelchair Mobility Wheelchair Mobility: No Modified Rankin (Stroke Patients Only) Pre-Morbid Rankin Score: No symptoms Modified Rankin: No symptoms         Visit Information  Last PT Received On: 11/10/12 Assistance Needed: +1    Subjective Data  Subjective: I've been better Patient Stated Goal: To return home   Prior Functioning  Home Living Lives With: Son Available Help at Discharge: Family;Available 24 hours/day Type of Home: Apartment Home Access: Stairs to enter Entrance Stairs-Number of Steps: 30 (3 flights) Entrance Stairs-Rails: Can reach both Home Layout: One level Bathroom Shower/Tub: Network engineer: None Prior Function Level of Independence: Independent Able to Take Stairs?: Yes Driving: Yes 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 Communication Communication: No difficulties Dominant Hand: Right    Cognition  Overall Cognitive Status: Appears within functional limits for tasks assessed/performed Arousal/Alertness: Awake/alert Orientation Level: Oriented X4 / Intact Behavior During Session: [facility] for tasks performed    Extremity/Trunk Assessment Right Upper Extremity Assessment RUE ROM/Strength/Tone: WFL for tasks assessed RUE Sensation: WFL - Light Touch RUE Coordination: WFL - gross/fine motor Left Upper Extremity Assessment LUE ROM/Strength/Tone: WFL for tasks assessed LUE Sensation: WFL - Light Touch LUE Coordination: WFL - gross/fine motor Right Lower Extremity Assessment RLE ROM/Strength/Tone: WFL for tasks assessed RLE Sensation: WFL - Light Touch RLE Coordination: WFL - gross/fine motor Left Lower Extremity Assessment LLE ROM/Strength/Tone: WFL for tasks assessed LLE  Sensation: WFL - Light Touch LLE Coordination: WFL - gross/fine motor Trunk Assessment Trunk Assessment: Normal   Balance Balance  Balance Assessed: Yes Static Sitting Balance Static Sitting - Balance Support: No upper extremity supported;Feet supported Static Sitting - Level of Assistance: 7: Independent Static Standing Balance Static Standing - Balance Support: No upper extremity supported;During functional activity Static Standing - Level of Assistance: 7: Independent Standardized Balance Assessment Standardized Balance Assessment: Dynamic Gait Index Dynamic Gait Index Level Surface: Mild Impairment Change in Gait Speed: Mild Impairment Gait with Horizontal Head Turns: Normal Gait with Vertical Head Turns: Normal Gait and Pivot Turn: Mild Impairment Step Over Obstacle: Normal Step Around Obstacles: Normal Steps: Mild Impairment Total Score: 20 (suggesting low risk of falls, suspect pt will be at 22/24 with rest and return to normal activity)  End of Session PT - End of Session Activity Tolerance: Patient tolerated treatment well Patient left: in bed;with call bell/phone within reach;with nursing in room Nurse Communication: Mobility status  GP Functional Assessment Tool Used: Clinical Judgement Functional Limitation: Mobility: Walking and moving around Mobility: Walking and Moving Around Current Status ([id number]): 0 percent impaired, limited or restricted Mobility: Walking and Moving Around Discharge Status [id number]): 0 percent impaired, limited or restricted   INGOLD,Shelden Raborn 11/10/2012, 1:59 PM  Sharion Balloon, SPT Acute Rehab Services [phone]  [facility] [phone] (pager)

## 2012-11-10 NOTE — Progress Notes (Signed)
VASCULAR LAB PRELIMINARY  PRELIMINARY  PRELIMINARY  PRELIMINARY  Carotid duplex  completed.    Preliminary report:  Bilateral:  No evidence of hemodynamically significant internal carotid artery stenosis.   Vertebral artery flow is antegrade.      [name], RVT 11/10/2012, 11:32 AM

## 2012-11-10 NOTE — Progress Notes (Signed)
[facility] PROGRESS NOTE  [name] ZOX:[id number] DOB: 19-Oct-1971 DOA: 11/09/2012 PCP: Loreen Freud, DO  Assessment/Plan: TIA (transient ischemic attack) : MRI neg for stroke. Appreciate neuro assistance. Echo and carotid doppler pending. Lipid panel WNL. PT/OT pending. EKG WNL. Continue asa Active Problems:   HYPERTENSION : SBP range 99-120. Currently on home HCTZ and BB. Will hold HCTZ for now. Monitor Slurred speech ; resolved. Likely related to #1. Passed swallow eval.  Weakness of right upper extremity: resolved. See #1 Hypokalemia: mild. Likely related to HCTZ. Will hold HCTZ for now given SBP range and pt supplemented yesterday with no improvement. Will check mag level. Will continue home daily dose of and recheck in am  Code Status: full Family Communication: pt at bedside Disposition Plan: home when ready maybe late today but probably in am   Consultants:  neuro  Procedures:  none  Antibiotics:   none  HPI/Subjective: Awake alert sitting up eating breakfast. Denies pain/discomfort. NAD  Objective: Filed Vitals:   11/09/12 2108 11/10/12 0000 11/10/12 0526 11/10/12 0800  BP: 127/75  102/66 99/64  Pulse: 66  63 73  Temp: 98.6 F (37 C)  98.4 F (36.9 C) 98.7 F (37.1 C)  TempSrc: Oral Oral Oral Oral  Resp: 15  15 16   Height: 5\' 3"  (1.6 m)     Weight: 83.9 kg (184 lb 15.5 oz)  83.6 kg (184 lb 4.9 oz)   SpO2: 100%  100% 99%    Intake/Output Summary (Last 24 hours) at 11/10/12 0932 Last data filed at 11/09/12 2227  Gross per 24 hour  Intake      3 ml  Output      0 ml  Net      3 ml   Filed Weights   11/09/12 2108 11/10/12 0526  Weight: 83.9 kg (184 lb 15.5 oz) 83.6 kg (184 lb 4.9 oz)    Exam:   General:  Well nourished NAD  Cardiovascular: RRR No MGR No LEE PPP  Respiratory: normal effort BSCTAB No wheeze/rhonchi  Abdomen: round soft +BS non-tender to palpation  Neuro: speech clear, facial symmetry. Cranial nerve II-XII  intact  Data Reviewed: Basic Metabolic Panel:  Lab 11/09/12 8119 11/09/12 1523  NA 140 136  K 3.3* 3.3*  CL 103 100  CO2 -- 25  GLUCOSE 92 90  BUN 10 10  CREATININE 0.90 0.91  CALCIUM -- 9.3  MG -- --  PHOS -- --   Liver Function Tests:  Lab 11/09/12 1523  AST 16  ALT 13  ALKPHOS 62  BILITOT 0.2*  PROT 7.0  ALBUMIN 3.4*   No results found for this basename: LIPASE:5,AMYLASE:5 in the last 168 hours No results found for this basename: AMMONIA:5 in the last 168 hours CBC:  Lab 11/09/12 1532 11/09/12 1523  WBC -- 8.1  NEUTROABS -- 3.3  HGB 13.6 12.1  HCT 40.0 36.6  MCV -- 86.3  PLT -- 271   Cardiac Enzymes:  Lab 11/09/12 1524  CKTOTAL --  CKMB --  CKMBINDEX --  TROPONINI <0.30   BNP (last 3 results) No results found for this basename: PROBNP:3 in the last 8760 hours CBG:  Lab 11/09/12 1548  GLUCAP 107*    No results found for this or any previous visit (from the past 240 hour(s)).   Studies: Ct Head Wo Contrast  11/09/2012  *RADIOLOGY REPORT*  Clinical Data: Code stroke.  Sudden onset of confusion.  Right- sided weakness.  CT HEAD WITHOUT CONTRAST  Technique:  Contiguous axial images were obtained from the base of the skull through the vertex without contrast.  Comparison: 08/25/2010 and earlier  Findings: There is no intra or extra-axial fluid collection or mass lesion.  The basilar cisterns and ventricles have a normal appearance.  There is no CT evidence for acute infarction or hemorrhage. As noted on prior study, there is focal calcification within the globus pallidus, left greater than right.  Bone windows show no acute finding.  IMPRESSION: Negative exam.  Critical test results telephoned to Dr. Roseanne Reno at the time of interpretation on date 11/09/2012 at time 3:49 p.m.   Original Report Authenticated By: Norva Pavlov, M.D.    Mr Brain Wo Contrast  11/09/2012  *RADIOLOGY REPORT*  Clinical Data:  Right-sided hemiparesis, headache and delirium.  Decreased sensation.  Improvement with only sensation loss remaining.  MRI BRAIN WITHOUT CONTRAST MRA HEAD WITHOUT CONTRAST  Technique: Multiplanar, multiecho pulse sequences of the brain and surrounding structures were obtained according to standard protocol without intravenous contrast.  Angiographic images of the head were obtained using MRA technique without contrast.  Comparison: 11/09/2012 CT.  08/02/2011 and 10/10/2008 MR brain. 10/10/2008 MR angiogram.  MRI HEAD  Findings:  No acute infarct.  Stable appearance of the minimal cystic encephalomalacia adjacent to the frontal horns more notable on the left which may reflect result of prior remote infarct/ischemia.  No intracranial mass lesion detected on this unenhanced exam.  No hydrocephalus.  No intracranial hemorrhage.  Minimal nonspecific white matter type changes.  IMPRESSION: No acute abnormality.  Please see above.  MRA HEAD  Findings: Anterior circulation without medium or large size vessel significant stenosis or occlusion.  Azygos configuration A2 segment anterior cerebral artery.  Left vertebral artery is dominant.  No significant stenosis of the basilar artery.  Poor delineation of the AICAs.  Poor delineation left superior cerebellar artery.  Mild branch vessel irregularity.  No aneurysm or vascular malformation noted.  IMPRESSION:  No medium or large size vessel significant stenosis or occlusion as noted above.  Mild branch vessel irregularity.   Original Report Authenticated By: Lacy Duverney, M.D.    Mr Mra Head/brain Wo Cm  11/09/2012  *RADIOLOGY REPORT*  Clinical Data:  Right-sided hemiparesis, headache and delirium. Decreased sensation.  Improvement with only sensation loss remaining.  MRI BRAIN WITHOUT CONTRAST MRA HEAD WITHOUT CONTRAST  Technique: Multiplanar, multiecho pulse sequences of the brain and surrounding structures were obtained according to standard protocol without intravenous contrast.  Angiographic images of the head were  obtained using MRA technique without contrast.  Comparison: 11/09/2012 CT.  08/02/2011 and 10/10/2008 MR brain. 10/10/2008 MR angiogram.  MRI HEAD  Findings:  No acute infarct.  Stable appearance of the minimal cystic encephalomalacia adjacent to the frontal horns more notable on the left which may reflect result of prior remote infarct/ischemia.  No intracranial mass lesion detected on this unenhanced exam.  No hydrocephalus.  No intracranial hemorrhage.  Minimal nonspecific white matter type changes.  IMPRESSION: No acute abnormality.  Please see above.  MRA HEAD  Findings: Anterior circulation without medium or large size vessel significant stenosis or occlusion.  Azygos configuration A2 segment anterior cerebral artery.  Left vertebral artery is dominant.  No significant stenosis of the basilar artery.  Poor delineation of the AICAs.  Poor delineation left superior cerebellar artery.  Mild branch vessel irregularity.  No aneurysm or vascular malformation noted.  IMPRESSION:  No medium or large size vessel significant stenosis or occlusion as noted above.  Mild branch vessel irregularity.   Original Report Authenticated By: Lacy Duverney, M.D.     Scheduled Meds:    .  stroke: mapping our early stages of recovery book   Does not apply Once  . aspirin  325 mg Oral Daily  . hydrochlorothiazide  25 mg Oral Daily  . metoprolol  50 mg Oral BID  . potassium chloride SA  40 mEq Oral Daily  . sodium chloride  3 mL Intravenous Q12H   Continuous Infusions:   Principal Problem:  *TIA (transient ischemic attack) Active Problems:  Morbid obesity  HYPERTENSION  Slurred speech  Weakness of right upper extremity  Hypokalemia    Time spent: 30 minutese    [facility] M  [facility]  If 8PM-8AM, please contact night-coverage at [email or web address], password [facility] 11/10/2012, 9:32 AM  LOS: 1 day

## 2012-11-11 ENCOUNTER — Other Ambulatory Visit: Payer: Self-pay | Admitting: Family Medicine

## 2012-11-11 ENCOUNTER — Telehealth: Payer: Self-pay | Admitting: Family Medicine

## 2012-11-11 DIAGNOSIS — G459 Transient cerebral ischemic attack, unspecified: Secondary | ICD-10-CM

## 2012-11-11 NOTE — Telephone Encounter (Signed)
Can you scheduled 11/22/12 at 11:15 for 30 minutes, The patient is already aware.      KP

## 2012-11-11 NOTE — Telephone Encounter (Signed)
Has she tried anything else for headache---ie tylenol , excedrin----  ? Did hospital set up neuro f/u?

## 2012-11-11 NOTE — Telephone Encounter (Signed)
Patient is scheduled   

## 2012-11-11 NOTE — Telephone Encounter (Signed)
Patient Information:  Caller Name: Kylar  Phone: [phone]  Patient: [name], [name]  Gender: Female  DOB: 1971/08/24  Age: 41 Years  PCP: Lelon Perla.  Pregnant: No  Office Follow Up:  Does the office need to follow up with this patient?: Yes  Instructions For The Office: Patient discharged yesterday with TIA. She states ongoing headache since starting ASA 325mg  daily regimen.  She is concerned about headaches. Appt. Scheduled tomorrow 11:15.  Advised physician of patient concern  RN Note:  advised Motrin 600mg  every 8 hours.  Symptoms  Reason For Call & Symptoms: Patient came to the office for decreased feeling in arm/could not rememeber name. Transported by EMS. Patient was discharged yesterday 11/10/12 from [facility] with possible TIA.  Patient was placed ASA 325mg  daily at the hospital .  She states she has been having headaches  after taking the ASA.  She does not noramally have headaches. Location on front of her head. Constant. Described as "pounding".   MRI are negative.  She is frightened about having a regular headache"  Reviewed Health History In EMR: Yes  Reviewed Medications In EMR: Yes  Reviewed Allergies In EMR: Yes  Reviewed Surgeries / Procedures: Yes  Date of Onset of Symptoms: 11/09/2012 OB / GYN:  LMP: Unknown  Guideline(s) Used:  Headache  Disposition Per Guideline:   See Today or Tomorrow in Office  Reason For Disposition Reached:   Unexplained headache that is present > 24 hours  Advice Given:  Call Back If:  Headache lasts longer than 24 hours  You become worse.  Rest:   Lie down in a dark, quiet place and try to relax. Close your eyes and imagine your entire body relaxing.  Appointment Scheduled:  11/12/2012 11:15:00 Appointment Scheduled Provider:  Lelon Perla

## 2012-11-11 NOTE — ED Provider Notes (Signed)
See prior note   Ward Givens, MD 11/11/12 [phone]

## 2012-11-11 NOTE — Telephone Encounter (Signed)
Patient called in stating that she needs to schedule a post hospital follow up. Next available is 12/02/11(I did not schedule). Should patient be seen sooner?

## 2012-11-11 NOTE — Telephone Encounter (Signed)
Spoke with patient and the ibuprofen has helped, she will keep apt tomorrow and will have her info faxed to Neuro. She has been made aware if any issues like blurred vision, neuro issues she will need to go back to the ED and she agreed to do so.  KP

## 2012-11-12 ENCOUNTER — Encounter: Payer: Self-pay | Admitting: Family Medicine

## 2012-11-12 ENCOUNTER — Ambulatory Visit (INDEPENDENT_AMBULATORY_CARE_PROVIDER_SITE_OTHER): Payer: Managed Care, Other (non HMO) | Admitting: Family Medicine

## 2012-11-12 VITALS — BP 114/76 | HR 95 | Temp 98.5°F | Wt 185.4 lb

## 2012-11-12 DIAGNOSIS — G459 Transient cerebral ischemic attack, unspecified: Secondary | ICD-10-CM

## 2012-11-12 DIAGNOSIS — G43909 Migraine, unspecified, not intractable, without status migrainosus: Secondary | ICD-10-CM

## 2012-11-12 DIAGNOSIS — R519 Headache, unspecified: Secondary | ICD-10-CM | POA: Insufficient documentation

## 2012-11-12 DIAGNOSIS — R51 Headache: Secondary | ICD-10-CM | POA: Insufficient documentation

## 2012-11-12 MED ORDER — CYCLOBENZAPRINE HCL 10 MG PO TABS: 10.0000 mg | ORAL_TABLET | Freq: Three times a day (TID) | ORAL | Status: DC | PRN

## 2012-11-12 MED ORDER — TRAMADOL HCL 50 MG PO TABS: 50.0000 mg | ORAL_TABLET | Freq: Three times a day (TID) | ORAL | Status: DC | PRN

## 2012-11-12 MED ORDER — KETOROLAC TROMETHAMINE 60 MG/2ML IM SOLN
60.0000 mg | Freq: Once | INTRAMUSCULAR | Status: AC
  Administered 2012-11-12: 60 mg via INTRAMUSCULAR

## 2012-11-12 NOTE — Assessment & Plan Note (Signed)
On aspirin 325 daily F/u neuro

## 2012-11-12 NOTE — Patient Instructions (Addendum)
Transient Ischemic Attack  A transient ischemic attack (TIA) is a "warning stroke" that causes stroke-like symptoms. Unlike a stroke, a TIA does not cause permanent damage to the brain. The symptoms of a TIA can happen very fast and do not last long. It is important to know the symptoms of a TIA and what to do. This can help prevent a major stroke or death.  CAUSES    A TIA is caused by a temporary blockage in an artery in the brain or neck (carotid artery). The blockage does not allow the brain to get the blood supply it needs and can cause different symptoms. The blockage can be caused by either:   A blood clot.   Fatty buildup (plaque) in a neck or brain artery.  SYMPTOMS   TIA symptoms are the same as a stroke but are temporary. Symptoms can include sudden:   Numbness or weakness on one side of the body. Especially to the:   Face.   Arm.   Leg.   Trouble speaking, thinking, or confusion.   Change in vision, such as trouble seeing in one or both eyes.   Dizziness, loss of balance, or difficulty walking.   Severe headache.  ANY OF THESE SYMPTOMS MAY REPRESENT A SERIOUS PROBLEM THAT IS AN EMERGENCY. Do not wait to see if the symptoms will go away. Get medical help at once. Call your local emergency services (911 in [place] IMMEDIATELY. DO NOT drive yourself to the hospital.  RISK FACTORS  Risk factors can increase the risk of developing a TIA. These can include.    High blood pressure (hypertension).   High cholesterol (hyperlipidemia).   Heart disease (atherosclerosis).   Smoking.   Diabetes.   Abnormal heart rhythm (atrial fibrillation).   Family history of a stroke or heart attack.   Use of oral contraceptives (especially when combined with smoking).  DIAGNOSIS    A TIA can be diagnosed based on your:   Symptoms.   History.   Risk factors.   Tests that can help diagnose the symptoms of a TIA include:   CT or MRI scan. These tests can provide detailed images of the brain.   Carotid  ultrasound. This test looks to see if there are blockages in the carotid arteries of your neck.   Arteriography. A thin, small flexible tube (catheter) is inserted through a small cut (incision) in your groin. The catheter is threaded to your carotid or vertebral artery. A dye is then injected into the catheter. The dye highlights the arteries in your brain and allows your caregiver to look for narrowing or blockages that can cause a TIA.  TREATMENT   Based on the cause of a TIA, treatment options can vary. Treatment is important to help prevent a stroke. Treatment options can include:   Medication. Such as:   Clot-busting medicine.   Anti-platelet medicine.   Blood pressure medicine.   Blood thinner medicine.   Surgery:   Carotid endarterectomy. The carotid arteries are the arteries that supply the head and neck with oxygenated blood. This surgery can help remove fatty deposits (plaque) in the carotid arteries.   Angioplasty and stenting. This surgery uses a balloon to dilate a blocked artery in the brain. A stent is a small, metal mesh tube that can help keep an artery open  HOME CARE INSTRUCTIONS    It is important to take all medicine as told by your caregiver. If the medicine has side effects that affect you negatively,   tell your caregiver right away. Do not stop taking medicine unless told by your caregiver. Some medicines may need to be changed to better treat your condition.   Do not smoke. Talk to your caregiver on how to quit smoking.   Eat a diet high in fruits, vegetables and lean meat. Avoid a high fat, high salt diet. A dietician can you help you make healthy food choices.   Maintain a healthy weight. Develop an exercise plan approved by your caregiver.  SEEK IMMEDIATE MEDICAL CARE IF:    You develop weakness or numbness on one side of your body.   You have problems thinking, speaking, or feel confused.   You have vision changes.   You feel dizzy, have trouble walking, or lose your  balance.   You develop a severe headache.  MAKE SURE YOU:    Understand these instructions.   Will watch your condition.   Will get help right away if you are not doing well or get worse.  Document Released: 08/20/2005 Document Revised: 02/02/2012 Document Reviewed: 01/03/2010  [employer] Patient Information 2013 [employer].

## 2012-11-12 NOTE — Assessment & Plan Note (Signed)
torodol given in office Flexeril and ultram sent to pharmacy F/u neuro

## 2012-11-12 NOTE — Progress Notes (Signed)
  Subjective:    Patient ID: [name], female    DOB: 1971/06/01, 41 y.o.   MRN: [id number]  HPI Pt here f/u hospital for TIA.  Pt is still having headache.   See ER and hospital records.   Review of Systems As above    Objective:   Physical Exam  BP 114/76  Pulse 95  Temp 98.5 F (36.9 C) (Oral)  Wt 185 lb 6.4 oz (84.097 kg)  SpO2 99%  LMP 04/18/2012 General appearance: alert, cooperative, appears stated age and no distress Eyes: conjunctivae/corneas clear. PERRL, EOM's intact. Fundi benign. Throat: lips, mucosa, and tongue normal; teeth and gums normal Neck: no adenopathy, no carotid bruit, no JVD, supple, symmetrical, trachea midline and thyroid not enlarged, symmetric, no tenderness/mass/nodules Lungs: clear to auscultation bilaterally Heart: S1, S2 normal Extremities: extremities normal, atraumatic, no cyanosis or edema Neurologic: Grossly normal      Assessment & Plan:

## 2012-11-18 ENCOUNTER — Encounter: Payer: Self-pay | Admitting: Family Medicine

## 2012-11-19 ENCOUNTER — Encounter: Payer: Self-pay | Admitting: Lab

## 2012-11-22 ENCOUNTER — Ambulatory Visit (INDEPENDENT_AMBULATORY_CARE_PROVIDER_SITE_OTHER): Payer: Managed Care, Other (non HMO) | Admitting: Family Medicine

## 2012-11-22 ENCOUNTER — Encounter: Payer: Self-pay | Admitting: Family Medicine

## 2012-11-22 VITALS — BP 122/74 | HR 72 | Temp 98.6°F | Wt 188.2 lb

## 2012-11-22 DIAGNOSIS — G459 Transient cerebral ischemic attack, unspecified: Secondary | ICD-10-CM

## 2012-11-22 DIAGNOSIS — I10 Essential (primary) hypertension: Secondary | ICD-10-CM

## 2012-11-22 DIAGNOSIS — E876 Hypokalemia: Secondary | ICD-10-CM

## 2012-11-22 LAB — BASIC METABOLIC PANEL
CO2: 30 mEq/L (ref 19–32)
Chloride: 102 mEq/L (ref 96–112)
Sodium: 139 mEq/L (ref 135–145)

## 2012-11-22 NOTE — Assessment & Plan Note (Signed)
Symptoms completely resolved.

## 2012-11-22 NOTE — Assessment & Plan Note (Signed)
Stable con't meds except hctz

## 2012-11-22 NOTE — Progress Notes (Signed)
  Subjective:    Patient ID: [name], female    DOB: [birth year], 41 y.o.   MRN: [id number]  HPI Pt here for fmla papers to be filled out.  Pt is back at work.  No other complaints.    Review of Systems As above    Objective:   Physical Exam  BP 122/74  Pulse 72  Temp 98.6 F (37 C) (Oral)  Wt 188 lb 3.2 oz (85.367 kg)  SpO2 97%  LMP 04/18/2012 General appearance: alert, cooperative, appears stated age and no distress Neurologic: Grossly normal      Assessment & Plan:

## 2012-11-22 NOTE — Patient Instructions (Addendum)

## 2012-11-22 NOTE — Assessment & Plan Note (Signed)
Stop hctx con't kcl for 1 week and recheck bmp 2 weeks

## 2012-12-06 ENCOUNTER — Other Ambulatory Visit (INDEPENDENT_AMBULATORY_CARE_PROVIDER_SITE_OTHER): Payer: Managed Care, Other (non HMO)

## 2012-12-06 DIAGNOSIS — E876 Hypokalemia: Secondary | ICD-10-CM

## 2012-12-06 LAB — POTASSIUM: Potassium: 3.5 mEq/L (ref 3.5–5.1)

## 2012-12-14 ENCOUNTER — Encounter: Payer: Self-pay | Admitting: Family Medicine

## 2012-12-16 ENCOUNTER — Telehealth: Payer: Self-pay | Admitting: Family Medicine

## 2012-12-16 NOTE — Telephone Encounter (Signed)
In reference to Neurology referral entered on 11/11/12, for TIA and Persistent Headaches, patient declined.  Per fax from [facility], when they finally spoke with patient to schedule her for an appointment, she chose not to schedule an appointment with them.

## 2012-12-16 NOTE — Telephone Encounter (Signed)
Please ask pt why-- its important she sees someone

## 2012-12-17 NOTE — Telephone Encounter (Signed)
msg left to call the office     KP 

## 2012-12-20 NOTE — Telephone Encounter (Signed)
Patient had not had any symptoms since switching her medications, she sated if the headaches came back she was told she could make an appointment within 3 mos.        KP

## 2013-03-03 ENCOUNTER — Other Ambulatory Visit: Payer: Self-pay | Admitting: Obstetrics and Gynecology

## 2013-03-03 DIAGNOSIS — N63 Unspecified lump in unspecified breast: Secondary | ICD-10-CM

## 2013-03-03 DIAGNOSIS — Z1231 Encounter for screening mammogram for malignant neoplasm of breast: Secondary | ICD-10-CM

## 2013-03-16 ENCOUNTER — Ambulatory Visit
Admission: RE | Admit: 2013-03-16 | Discharge: 2013-03-16 | Disposition: A | Discharge status: Home / Self Care | Payer: Managed Care, Other (non HMO) | Source: Ambulatory Visit | Attending: Obstetrics and Gynecology | Admitting: Obstetrics and Gynecology

## 2013-03-16 DIAGNOSIS — N63 Unspecified lump in unspecified breast: Secondary | ICD-10-CM

## 2013-06-09 ENCOUNTER — Ambulatory Visit (INDEPENDENT_AMBULATORY_CARE_PROVIDER_SITE_OTHER): Payer: Managed Care, Other (non HMO) | Admitting: Family Medicine

## 2013-06-09 ENCOUNTER — Encounter: Payer: Self-pay | Admitting: Family Medicine

## 2013-06-09 VITALS — BP 126/86 | HR 88 | Temp 98.2°F | Wt 191.6 lb

## 2013-06-09 DIAGNOSIS — Z7251 High risk heterosexual behavior: Secondary | ICD-10-CM

## 2013-06-09 DIAGNOSIS — E876 Hypokalemia: Secondary | ICD-10-CM

## 2013-06-09 DIAGNOSIS — Z2089 Contact with and (suspected) exposure to other communicable diseases: Secondary | ICD-10-CM

## 2013-06-09 DIAGNOSIS — I10 Essential (primary) hypertension: Secondary | ICD-10-CM

## 2013-06-09 DIAGNOSIS — J45909 Unspecified asthma, uncomplicated: Secondary | ICD-10-CM

## 2013-06-09 DIAGNOSIS — Z202 Contact with and (suspected) exposure to infections with a predominantly sexual mode of transmission: Secondary | ICD-10-CM

## 2013-06-09 MED ORDER — ALBUTEROL SULFATE HFA 108 (90 BASE) MCG/ACT IN AERS: 2.0000 | INHALATION_SPRAY | Freq: Four times a day (QID) | RESPIRATORY_TRACT | Status: DC | PRN

## 2013-06-09 MED ORDER — POTASSIUM CHLORIDE CRYS ER 20 MEQ PO TBCR: 40.0000 meq | EXTENDED_RELEASE_TABLET | Freq: Every day | ORAL | Status: DC

## 2013-06-09 NOTE — Progress Notes (Signed)
  Subjective:    Patient here for follow-up of elevated blood pressure.  She is exercising and is adherent to a low-salt diet.  Blood pressure is well controlled at home. Cardiac symptoms: none. Patient denies: chest pain, chest pressure/discomfort, claudication, dyspnea, exertional chest pressure/discomfort, fatigue, irregular heart beat, lower extremity edema, near-syncope, orthopnea, palpitations, paroxysmal nocturnal dyspnea, syncope and tachypnea. Cardiovascular risk factors: hypertension and obesity (BMI >= 30 kg/m2). Use of agents associated with hypertension: none. History of target organ damage: none.  The following portions of the patient's history were reviewed and updated as appropriate: allergies, current medications, past family history, past medical history, past social history, past surgical history and problem list.  Review of Systems Pertinent items are noted in HPI.     Objective:    BP 126/86  Pulse 88  Temp(Src) 98.2 F (36.8 C) (Oral)  Wt 191 lb 9.6 oz (86.909 kg)  BMI 33.95 kg/m2  SpO2 99%  LMP 04/18/2012 General appearance: alert, cooperative, appears stated age and no distress Neck: no adenopathy, no carotid bruit, no JVD, supple, symmetrical, trachea midline and thyroid not enlarged, symmetric, no tenderness/mass/nodules Lungs: clear to auscultation bilaterally Heart: S1, S2 normal Extremities: extremities normal, atraumatic, no cyanosis or edema    Assessment:    Hypertension, normal blood pressure . Evidence of target organ damage: none.    Plan:    Medication: no change. Screening labs for initial evaluation: none indicated. Dietary sodium restriction. Regular aerobic exercise. Check blood pressures 2-3 times weekly and record. Follow up: 6 months and as needed.

## 2013-06-09 NOTE — Patient Instructions (Addendum)

## 2013-06-10 LAB — HIV ANTIBODY (ROUTINE TESTING W REFLEX): HIV: NONREACTIVE

## 2013-06-10 LAB — BASIC METABOLIC PANEL
Calcium: 9.3 mg/dL (ref 8.4–10.5)
Creatinine, Ser: 0.8 mg/dL (ref 0.4–1.2)
GFR: 98.49 mL/min (ref >60.00)
Glucose, Bld: 74 mg/dL (ref 70–99)
Sodium: 138 mEq/L (ref 135–145)

## 2013-06-10 LAB — RPR

## 2013-06-11 DIAGNOSIS — Z202 Contact with and (suspected) exposure to infections with a predominantly sexual mode of transmission: Secondary | ICD-10-CM | POA: Insufficient documentation

## 2013-06-11 LAB — GC/CHLAMYDIA PROBE AMP, URINE: Chlamydia, Swab/Urine, PCR: NEGATIVE

## 2013-06-11 NOTE — Assessment & Plan Note (Signed)
Check labs inc urine

## 2013-06-11 NOTE — Assessment & Plan Note (Signed)
Check labs con't k supp

## 2013-06-11 NOTE — Assessment & Plan Note (Signed)
con't meds stable 

## 2013-07-15 ENCOUNTER — Telehealth: Payer: Self-pay | Admitting: Family Medicine

## 2013-07-15 NOTE — Telephone Encounter (Signed)
Please advise      KP 

## 2013-07-15 NOTE — Telephone Encounter (Signed)
Patient is requesting to come in for a TB skin test. Please advise if okay.

## 2013-07-15 NOTE — Telephone Encounter (Signed)
ok 

## 2013-07-19 ENCOUNTER — Encounter: Payer: Managed Care, Other (non HMO) | Admitting: *Deleted

## 2013-07-29 ENCOUNTER — Encounter: Payer: Self-pay | Admitting: Family Medicine

## 2013-07-29 ENCOUNTER — Telehealth: Payer: Self-pay | Admitting: Family Medicine

## 2013-07-29 DIAGNOSIS — Z20828 Contact with and (suspected) exposure to other viral communicable diseases: Secondary | ICD-10-CM

## 2013-07-29 NOTE — Telephone Encounter (Signed)
Please advise      KP 

## 2013-07-29 NOTE — Telephone Encounter (Signed)
Patient says that she reviewed her lab results online but did not see  Where we tested for herpes. Patient wants to know if she can come in for this lab because she thinks she may have been exposed. Please advise.

## 2013-07-30 NOTE — Telephone Encounter (Signed)
i put the order in---- why was it cancelled?

## 2013-08-01 ENCOUNTER — Other Ambulatory Visit (INDEPENDENT_AMBULATORY_CARE_PROVIDER_SITE_OTHER): Payer: Managed Care, Other (non HMO)

## 2013-08-01 DIAGNOSIS — Z20828 Contact with and (suspected) exposure to other viral communicable diseases: Secondary | ICD-10-CM

## 2013-08-01 NOTE — Telephone Encounter (Signed)
Is this ok to order? 

## 2013-08-01 NOTE — Telephone Encounter (Signed)
Patient is calling back. Please advise on patient's request.

## 2013-08-01 NOTE — Telephone Encounter (Signed)
Patient is coming in this afternoon for her hsv 2 testing that was not done at her visit. Can you place the order please?

## 2013-08-01 NOTE — Telephone Encounter (Signed)
Labs ordered.

## 2013-08-02 LAB — HSV(HERPES SMPLX)ABS-I+II(IGG+IGM)-BLD
HSV 2 Glycoprotein G Ab, IgG: 15.41 IV — ABNORMAL HIGH
Herpes Simplex Vrs I&II-IgM Ab (EIA): 1.12 INDEX — ABNORMAL HIGH

## 2013-08-05 ENCOUNTER — Encounter: Payer: Self-pay | Admitting: Family Medicine

## 2013-09-29 ENCOUNTER — Other Ambulatory Visit: Payer: Self-pay

## 2013-10-18 ENCOUNTER — Other Ambulatory Visit: Payer: Self-pay | Admitting: Family Medicine

## 2013-11-04 NOTE — Progress Notes (Signed)
This encounter was created in error - please disregard.

## 2013-12-28 ENCOUNTER — Ambulatory Visit (INDEPENDENT_AMBULATORY_CARE_PROVIDER_SITE_OTHER): Payer: Managed Care, Other (non HMO) | Admitting: Nurse Practitioner

## 2013-12-28 ENCOUNTER — Encounter: Payer: Self-pay | Admitting: Nurse Practitioner

## 2013-12-28 VITALS — BP 154/97 | HR 70 | Temp 97.8°F | Ht 64.02 in | Wt 192.2 lb

## 2013-12-28 DIAGNOSIS — A499 Bacterial infection, unspecified: Secondary | ICD-10-CM

## 2013-12-28 DIAGNOSIS — N760 Acute vaginitis: Secondary | ICD-10-CM

## 2013-12-28 DIAGNOSIS — B9689 Other specified bacterial agents as the cause of diseases classified elsewhere: Secondary | ICD-10-CM

## 2013-12-28 DIAGNOSIS — I10 Essential (primary) hypertension: Secondary | ICD-10-CM

## 2013-12-28 DIAGNOSIS — N390 Urinary tract infection, site not specified: Secondary | ICD-10-CM

## 2013-12-28 LAB — POCT URINALYSIS DIPSTICK
Glucose: NEGATIVE
Ketones: NEGATIVE
Leukocytes, UA: NEGATIVE
Nitrite, UA: NEGATIVE
Specific Gravity: 1.02
Urobilinogen, UA: NORMAL
pH: 6.5

## 2013-12-28 MED ORDER — METOPROLOL TARTRATE 50 MG PO TABS: 50.0000 mg | ORAL_TABLET | Freq: Two times a day (BID) | ORAL | Status: DC

## 2013-12-28 MED ORDER — METRONIDAZOLE 0.75 % VA GEL: 1.0000 | Freq: Two times a day (BID) | VAGINAL | Status: DC

## 2013-12-28 MED ORDER — CIPROFLOXACIN HCL 250 MG PO TABS: 250.0000 mg | ORAL_TABLET | Freq: Two times a day (BID) | ORAL | Status: DC

## 2013-12-28 NOTE — Progress Notes (Signed)
Pre-visit discussion using our clinic review tool. No additional management support is needed unless otherwise documented below in the visit note.  

## 2013-12-28 NOTE — Patient Instructions (Signed)
Please start antibiotic for urinary tract infection. Also use metrogel suppositories for vaginal odor, it is likely caused by bacterial vaginosis. If your symptoms do not improve, please let us know. Follow up w/Dr Birdie Riddle in 1 month for urine recheck.  Urinary Tract Infection Urinary tract infections (UTIs) can develop anywhere along your urinary tract. Your urinary tract is your body's drainage system for removing wastes and extra water. Your urinary tract includes two kidneys, two ureters, a bladder, and a urethra. Your kidneys are a pair of bean-shaped organs. Each kidney is about the size of your fist. They are located below your ribs, one on each side of your spine. CAUSES Infections are caused by microbes, which are microscopic organisms, including fungi, viruses, and bacteria. These organisms are so small that they can only be seen through a microscope. Bacteria are the microbes that most commonly cause UTIs. SYMPTOMS  Symptoms of UTIs may vary by age and gender of the patient and by the location of the infection. Symptoms in young women typically include a frequent and intense urge to urinate and a painful, burning feeling in the bladder or urethra during urination. Older women and men are more likely to be tired, shaky, and weak and have muscle aches and abdominal pain. A fever may mean the infection is in your kidneys. Other symptoms of a kidney infection include pain in your back or sides below the ribs, nausea, and vomiting. DIAGNOSIS To diagnose a UTI, your caregiver will ask you about your symptoms. Your caregiver also will ask to provide a urine sample. The urine sample will be tested for bacteria and white blood cells. White blood cells are made by your body to help fight infection. TREATMENT  Typically, UTIs can be treated with medication. Because most UTIs are caused by a bacterial infection, they usually can be treated with the use of antibiotics. The choice of antibiotic and length of  treatment depend on your symptoms and the type of bacteria causing your infection. HOME CARE INSTRUCTIONS  If you were prescribed antibiotics, take them exactly as your caregiver instructs you. Finish the medication even if you feel better after you have only taken some of the medication.  Drink enough water and fluids to keep your urine clear or pale yellow.  Avoid caffeine, tea, and carbonated beverages. They tend to irritate your bladder.  Empty your bladder often. Avoid holding urine for long periods of time.  Empty your bladder before and after sexual intercourse.  After a bowel movement, women should cleanse from front to back. Use each tissue only once. SEEK MEDICAL CARE IF:   You have back pain.  You develop a fever.  Your symptoms do not begin to resolve within 3 days. SEEK IMMEDIATE MEDICAL CARE IF:   You have severe back pain or lower abdominal pain.  You develop chills.  You have nausea or vomiting.  You have continued burning or discomfort with urination. MAKE SURE YOU:   Understand these instructions.  Will watch your condition.  Will get help right away if you are not doing well or get worse. Document Released: 08/20/2005 Document Revised: 05/11/2012 Document Reviewed: 12/19/2011 [facility] Patient Information 2014 [place].  Bacterial Vaginosis Bacterial vaginosis is a vaginal infection that occurs when the normal balance of bacteria in the vagina is disrupted. It results from an overgrowth of certain bacteria. This is the most common vaginal infection in women of childbearing age. Treatment is important to prevent complications, especially in pregnant women, as it  can cause a premature delivery. CAUSES  Bacterial vaginosis is caused by an increase in harmful bacteria that are normally present in smaller amounts in the vagina. Several different kinds of bacteria can cause bacterial vaginosis. However, the reason that the condition develops is not fully  understood. RISK FACTORS Certain activities or behaviors can put you at an increased risk of developing bacterial vaginosis, including:  Having a new sex partner or multiple sex partners.  Douching.  Using an intrauterine device (IUD) for contraception. Women do not get bacterial vaginosis from toilet seats, bedding, swimming pools, or contact with objects around them. SIGNS AND SYMPTOMS  Some women with bacterial vaginosis have no signs or symptoms. Common symptoms include:  Grey vaginal discharge.  A fishlike odor with discharge, especially after sexual intercourse.  Itching or burning of the vagina and vulva.  Burning or pain with urination. DIAGNOSIS  Your health care provider will take a medical history and examine the vagina for signs of bacterial vaginosis. A sample of vaginal fluid may be taken. Your health care provider will look at this sample under a microscope to check for bacteria and abnormal cells. A vaginal pH test may also be done.  TREATMENT  Bacterial vaginosis may be treated with antibiotic medicines. These may be given in the form of a pill or a vaginal cream. A second round of antibiotics may be prescribed if the condition comes back after treatment.  HOME CARE INSTRUCTIONS   Only take over-the-counter or prescription medicines as directed by your health care provider.  If antibiotic medicine was prescribed, take it as directed. Make sure you finish it even if you start to feel better.  Do not have sex until treatment is completed.  Tell all sexual partners that you have a vaginal infection. They should see their health care provider and be treated if they have problems, such as a mild rash or itching.  Practice safe sex by using condoms and only having one sex partner. SEEK MEDICAL CARE IF:   Your symptoms are not improving after 3 days of treatment.  You have increased discharge or pain.  You have a fever. MAKE SURE YOU:   Understand these  instructions.  Will watch your condition.  Will get help right away if you are not doing well or get worse. FOR MORE INFORMATION  Centers for Disease Control and Prevention, Division of STD Prevention: [employer] American Sexual Health Association (ASHA): www.ashastd.org  Document Released: 11/10/2005 Document Revised: 08/31/2013 Document Reviewed: 06/22/2013 [facility] Patient Information 2014 [place].

## 2013-12-29 LAB — URINE CULTURE
COLONY COUNT: NO GROWTH
ORGANISM ID, BACTERIA: NO GROWTH

## 2013-12-29 NOTE — Progress Notes (Signed)
   Subjective:    Patient ID: [name], female    DOB: 1971-09-23, 43 y.o.   MRN: [id number]  Urinary Tract Infection  This is a new problem. The current episode started in the past 7 days. The problem occurs every urination. The problem has been unchanged. The patient is experiencing no pain. There has been no fever. She is sexually active (pt reports low risk for STI, no new partners in >60 days). There is no history of pyelonephritis. Associated symptoms include a discharge (malodorous vaginal d/c). Pertinent negatives include no chills, flank pain, frequency, hematuria, hesitancy, nausea, possible pregnancy, sweats, urgency or vomiting. Associated symptoms comments: Dark, malodorous urine. She has tried nothing for the symptoms. HSV.      Review of Systems  Constitutional: Negative for fever, chills, activity change, appetite change and fatigue.  HENT: Negative for congestion.   Respiratory: Negative for cough.   Gastrointestinal: Positive for abdominal pain. Negative for nausea, vomiting and diarrhea.       1 episode sharp shooting pain LLq lasting few seconds while sitting at work.  Genitourinary: Positive for vaginal discharge. Negative for dysuria, hesitancy, urgency, frequency, hematuria, flank pain, genital sores, vaginal pain and pelvic pain.  Musculoskeletal: Negative for back pain.  Neurological: Negative for headaches.       Objective:   Physical Exam  Vitals reviewed. Constitutional: She is oriented to person, place, and time. She appears well-developed and well-nourished. No distress.  HENT:  Head: Normocephalic and atraumatic.  Eyes: Conjunctivae are normal. Right eye exhibits no discharge. Left eye exhibits no discharge.  Cardiovascular: Normal rate.   Pulmonary/Chest: Effort normal.  Abdominal: Soft. She exhibits no distension and no mass. There is no tenderness. There is no rebound and no guarding.  Musculoskeletal: She exhibits no tenderness (no cva  tenderness).  Neurological: She is alert and oriented to person, place, and time.  Skin: Skin is warm and dry.  Psychiatric: She has a normal mood and affect. Her behavior is normal. Thought content normal.          Assessment & Plan:  1. Bacterial vaginosis Reports malodorous d/c. No itching or pain. - metroNIDAZOLE (METROGEL) 0.75 % vaginal gel; Place 1 Applicatorful vaginally 2 (two) times daily.  Dispense: 70 g; Refill: 0  2. UTI (urinary tract infection) Dark urine, reports malodorous - ciprofloxacin (CIPRO) 250 MG tablet; Take 1 tablet (250 mg total) by mouth 2 (two) times daily.  Dispense: 6 tablet; Refill: 0  - Urine culture-pending - POCT Urinalysis-pos blood, leuks See pt instructions.

## 2013-12-30 ENCOUNTER — Encounter: Payer: Self-pay | Admitting: Family Medicine

## 2013-12-30 ENCOUNTER — Telehealth: Payer: Self-pay | Admitting: Family Medicine

## 2013-12-30 NOTE — Telephone Encounter (Signed)
Relevant patient education assigned to patient using Emmi. ° °

## 2014-02-14 ENCOUNTER — Encounter: Payer: Self-pay | Admitting: Internal Medicine

## 2014-02-14 ENCOUNTER — Ambulatory Visit (INDEPENDENT_AMBULATORY_CARE_PROVIDER_SITE_OTHER): Payer: Managed Care, Other (non HMO) | Admitting: Internal Medicine

## 2014-02-14 ENCOUNTER — Ambulatory Visit (HOSPITAL_BASED_OUTPATIENT_CLINIC_OR_DEPARTMENT_OTHER)
Admission: RE | Admit: 2014-02-14 | Discharge: 2014-02-14 | Disposition: A | Discharge status: Home / Self Care | Payer: Managed Care, Other (non HMO) | Source: Ambulatory Visit | Attending: Internal Medicine | Admitting: Internal Medicine

## 2014-02-14 VITALS — BP 162/96 | HR 77 | Temp 98.4°F | Wt 193.0 lb

## 2014-02-14 DIAGNOSIS — R05 Cough: Secondary | ICD-10-CM | POA: Insufficient documentation

## 2014-02-14 DIAGNOSIS — R059 Cough, unspecified: Secondary | ICD-10-CM | POA: Insufficient documentation

## 2014-02-14 DIAGNOSIS — R062 Wheezing: Secondary | ICD-10-CM | POA: Insufficient documentation

## 2014-02-14 DIAGNOSIS — R079 Chest pain, unspecified: Secondary | ICD-10-CM

## 2014-02-14 DIAGNOSIS — J45901 Unspecified asthma with (acute) exacerbation: Secondary | ICD-10-CM

## 2014-02-14 DIAGNOSIS — R0602 Shortness of breath: Secondary | ICD-10-CM | POA: Insufficient documentation

## 2014-02-14 DIAGNOSIS — I10 Essential (primary) hypertension: Secondary | ICD-10-CM

## 2014-02-14 MED ORDER — PREDNISONE 10 MG PO TABS: ORAL_TABLET | ORAL | Status: DC

## 2014-02-14 MED ORDER — AZITHROMYCIN 250 MG PO TABS: ORAL_TABLET | ORAL | Status: DC

## 2014-02-14 NOTE — Patient Instructions (Signed)
Get the XR at [facility], corner of [address] (10 minutes form here); they are open 24/7 [place], [place] [address] [phone]   Continue using  albuterol as needed for wheezing or persisting cough Mucinex DM twice a day as needed  Prednisone as prescribed  Take the antibiotic as prescribed  (zithromax) Call if no better in few days Call anytime is symptoms severe. If you've find herself using  more albuterol in the next month, please come back to the office for a recheck  Check the  blood pressure 2 or 3 times a  week be sure it is between 110/60 and 140/85. Ideal blood pressure is 120/80. If it is consistently higher or lower, let me know

## 2014-02-14 NOTE — Progress Notes (Signed)
Subjective:    Patient ID: [name], female    DOB: 01-17-1971, 43 y.o.   MRN: [id number]  DOS:  02/14/2014 Type of  visit: Acute visit  Patient is here because of cough, it started 2 weeks ago, initially she had runny nose and sore throat but that seems to be better. Also initially had some sputum but now is a dry cough. She has a history of asthma, previously well-controlled, now wheezing more frequently. She also reports on and off heaviness at  the anterior upper chest, once it starts  may last all day, no radiation, seems to decrease with cough. Also has noted DOE wen she goes upstairs. Despite that, she is able to do her routine exercise such zumba-pilates w/o   dyspnea on exertion or chest pain.   ROS No fever, some chills Mild sinus congestion Denies nausea or vomiting. No recent airplane trips or car trip. No pain or swelling in the calves or legs. Admits to a lot of stress in general  Past Medical History  Diagnosis Date  . Allergy   . Asthma   . Hypertension     pulmonary  . Herpes genitalia   . H/O: myomectomy   . GERD (gastroesophageal reflux disease)     no meds  . Heart murmur   . TIA (transient ischemic attack)   . Refusal of blood transfusions as patient is Jehovah's Witness   . Hearing impaired     can hear on left side only    Past Surgical History  Procedure Laterality Date  . Myomectomy  2004      2010 robotic  . Ovarian cyst removal  2010    x2  . Breast lumpectomy  01/06/11  . Robotic assisted lap vaginal hysterectomy    . Cystoscopy  05/19/2012    Procedure: CYSTOSCOPY;  Surgeon: Alwyn Pea, MD;  Location: [facility];  Service: Gynecology;  Laterality: N/A;    History   Social History  . Marital Status: Single    Spouse Name: N/A    Number of Children: N/A  . Years of Education: N/A   Occupational History  . Not on file.   Social History Main Topics  . Smoking status: Never Smoker   . Smokeless tobacco: Never Used  .  Alcohol Use: No  . Drug Use: No  . Sexual Activity: Yes    Partners: Male    Birth Control/ Protection: Surgical     Comment: hyst   Other Topics Concern  . 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  Always use your most recent med list.               albuterol 108 (90 BASE) MCG/ACT inhaler  Commonly known as:  PROVENTIL HFA;VENTOLIN HFA  Inhale 2 puffs into the lungs every 6 (six) hours as needed for wheezing.     aspirin 325 MG tablet  Take 1 tablet (325 mg total) by mouth daily.     azithromycin 250 MG tablet  Commonly known as:  ZITHROMAX Z-PAK  As directed     metoprolol 50 MG tablet  Commonly known as:  LOPRESSOR  Take 1 tablet (50 mg total) by mouth 2 (two) times daily.     ONE-A-DAY WOMENS PO  Take 1 tablet by  mouth daily.     predniSONE 10 MG tablet  Commonly known as:  DELTASONE  4 tablets x 2 days, 3 tabs x 2 days, 2 tabs x 2 days, 1 tab x 2 days     valACYclovir 500 MG tablet  Commonly known as:  VALTREX  Take 1 tablet by mouth daily.           Objective:   Physical Exam BP 162/96  Pulse 77  Temp(Src) 98.4 F (36.9 C)  Wt 193 lb (87.544 kg)  SpO2 94%  LMP 04/18/2012  General -- alert, well-developed, NAD but freq cough noted throught the visit .  TMs-- normal Nose slt congested Sinus-- slt tender at R maxilary  Lungs -- normal respiratory effort, no intercostal retractions, no accessory muscle use, and normal breath sounds.  Heart-- normal rate, regular rhythm, no murmur.  Abdomen-- Not distended, good bowel sounds,soft, non-tender.  Extremities-- no pretibial edema bilaterally ; calves symmetric Neurologic--  alert & oriented X3. Speech normal, gait normal, strength normal in all extremities.  Psych-- Cognition and judgment appear intact. Cooperative with normal attention span and concentration. No anxious or  depressed appearing.       Assessment & Plan:   43 year old lady with a history of asthma, TIA, on aspirin presents with what seems to be an asthma exacerbation (and mld sinusitis?). She also has chest pain and DOE EKG today nsr, no change compared to previous Plan: We'll treat as an asthma exacerbation Chest x-ray If not better recommend to call. See instructions   Hypertension, BP elevated today, at home reports her BPs are in the 120s. Recommend to continue monitoring

## 2014-02-14 NOTE — Progress Notes (Signed)
Pre visit review using our clinic review tool, if applicable. No additional management support is needed unless otherwise documented below in the visit note. 

## 2014-03-01 ENCOUNTER — Encounter: Payer: Self-pay | Admitting: Family Medicine

## 2014-03-02 ENCOUNTER — Ambulatory Visit (INDEPENDENT_AMBULATORY_CARE_PROVIDER_SITE_OTHER): Payer: Managed Care, Other (non HMO) | Admitting: Family Medicine

## 2014-03-02 ENCOUNTER — Other Ambulatory Visit (HOSPITAL_COMMUNITY)
Admission: RE | Admit: 2014-03-02 | Discharge: 2014-03-02 | Disposition: A | Discharge status: Home / Self Care | Payer: Managed Care, Other (non HMO) | Source: Ambulatory Visit | Attending: Family Medicine | Admitting: Family Medicine

## 2014-03-02 ENCOUNTER — Encounter: Payer: Self-pay | Admitting: Family Medicine

## 2014-03-02 VITALS — BP 132/84 | HR 80 | Temp 98.5°F | Wt 192.6 lb

## 2014-03-02 DIAGNOSIS — B9689 Other specified bacterial agents as the cause of diseases classified elsewhere: Secondary | ICD-10-CM

## 2014-03-02 DIAGNOSIS — N760 Acute vaginitis: Secondary | ICD-10-CM | POA: Insufficient documentation

## 2014-03-02 DIAGNOSIS — Z113 Encounter for screening for infections with a predominantly sexual mode of transmission: Secondary | ICD-10-CM | POA: Insufficient documentation

## 2014-03-02 DIAGNOSIS — A499 Bacterial infection, unspecified: Secondary | ICD-10-CM

## 2014-03-02 MED ORDER — METRONIDAZOLE 0.75 % VA GEL: VAGINAL | Status: DC

## 2014-03-02 NOTE — Patient Instructions (Signed)

## 2014-03-02 NOTE — Progress Notes (Signed)
  Subjective:    [name] is a 43 y.o. female who presents for sexually transmitted disease check. Sexual history reviewed with the patient. STI Exposure: multiple sexual partners: 3 in past months. Previous history of STI none. Current symptoms vaginal discharge: copious, white, thin and odorless, vaginal irritation: not present. Contraception: condoms Menstrual History: OB History   Grav Para Term Preterm Abortions TAB SAB Ect Mult Living   1 1        1        Patient's last menstrual period was 04/18/2012.    The following portions of the patient's history were reviewed and updated as appropriate: allergies, current medications, past family history, past medical history, past social history, past surgical history and problem list.  Review of Systems Pertinent items are noted in HPI.    Objective:    BP 132/84  Pulse 80  Temp(Src) 98.5 F (36.9 C) (Oral)  Wt 192 lb 9.6 oz (87.363 kg)  SpO2 98%  LMP 04/18/2012 General:   alert, cooperative, appears stated age and no distress  Lymph Nodes:   Cervical, supraclavicular, and axillary nodes normal.  Pelvis:  External genitalia: normal general appearance Vaginal: discharge, white Cervix: normal appearance Clinical staff offered to be present for exam: yes  Initials: AL  Cultures:  GC and Chlamydia genprobes and KOH     Assessment:     bacterial vaginosis      Plan:    Appropriate educational material was distributed See orders for STD cultures and assays See orders. RTC PRN  metrogel 1 app pv qhs x 5 days

## 2014-03-02 NOTE — Progress Notes (Signed)
Pre visit review using our clinic review tool, if applicable. No additional management support is needed unless otherwise documented below in the visit note. 

## 2014-03-02 NOTE — Addendum Note (Signed)
Addended by: Ewing Schlein on: 03/02/2014 04:17 PM   Modules accepted: Orders

## 2014-03-03 LAB — HSV 2 ANTIBODY, IGG: HSV 2 Glycoprotein G Ab, IgG: 17.89 IV — ABNORMAL HIGH

## 2014-03-03 LAB — HIV ANTIBODY (ROUTINE TESTING W REFLEX): HIV: NONREACTIVE

## 2014-03-03 LAB — RPR

## 2014-03-06 ENCOUNTER — Other Ambulatory Visit: Payer: Self-pay | Admitting: Family Medicine

## 2014-03-06 DIAGNOSIS — B379 Candidiasis, unspecified: Secondary | ICD-10-CM

## 2014-03-06 LAB — CERVICOVAGINAL ANCILLARY ONLY
Chlamydia: NEGATIVE
Neisseria Gonorrhea: NEGATIVE

## 2014-03-06 MED ORDER — FLUCONAZOLE 150 MG PO TABS: ORAL_TABLET | ORAL | Status: DC

## 2014-03-07 LAB — CERVICOVAGINAL ANCILLARY ONLY
WET PREP (BD AFFIRM): NEGATIVE
WET PREP (BD AFFIRM): NEGATIVE
WET PREP (BD AFFIRM): POSITIVE — AB

## 2014-04-07 ENCOUNTER — Telehealth: Payer: Self-pay | Admitting: Family Medicine

## 2014-04-07 NOTE — Telephone Encounter (Signed)
Increase metoprolol to 100 mg bid  ---ov 2 weeks

## 2014-04-07 NOTE — Telephone Encounter (Signed)
Caller name: Vennie  Call back number:  [phone]   Reason for call:   Pt is having elevated bp readings.  Most current as of 5/15 was 169/101 .  Pt is on BP medications.

## 2014-04-07 NOTE — Telephone Encounter (Signed)
Patient's blood pressures have been elevated.  She has been monitoring her blood pressures with her home blood machine.  Yesterday blood pressure was 183/116.  Today her blood pressure was 169/101.   States that she has been taking BP medication as prescribed.  50 mg of Lopressor in the morning and 50 mg of Lopressor in the evening.  Please advise.

## 2014-07-17 ENCOUNTER — Telehealth: Payer: Self-pay | Admitting: *Deleted

## 2014-07-17 DIAGNOSIS — I10 Essential (primary) hypertension: Secondary | ICD-10-CM

## 2014-07-17 MED ORDER — METOPROLOL TARTRATE 50 MG PO TABS: 50.0000 mg | ORAL_TABLET | Freq: Two times a day (BID) | ORAL | Status: DC

## 2014-07-17 NOTE — Telephone Encounter (Signed)
Fax request for Metoprolol Tart 50 mg tablet SIG: Take [1] tablet PO BID

## 2014-09-08 ENCOUNTER — Other Ambulatory Visit: Payer: Self-pay

## 2014-09-25 ENCOUNTER — Encounter: Payer: Self-pay | Admitting: Family Medicine

## 2014-11-21 ENCOUNTER — Ambulatory Visit (INDEPENDENT_AMBULATORY_CARE_PROVIDER_SITE_OTHER): Payer: Managed Care, Other (non HMO) | Admitting: Family Medicine

## 2014-11-21 ENCOUNTER — Encounter: Payer: Self-pay | Admitting: Family Medicine

## 2014-11-21 VITALS — BP 156/106 | HR 76 | Temp 97.8°F | Wt 198.6 lb

## 2014-11-21 DIAGNOSIS — I10 Essential (primary) hypertension: Secondary | ICD-10-CM

## 2014-11-21 DIAGNOSIS — G4700 Insomnia, unspecified: Secondary | ICD-10-CM

## 2014-11-21 MED ORDER — METOPROLOL TARTRATE 100 MG PO TABS: 100.0000 mg | ORAL_TABLET | Freq: Two times a day (BID) | ORAL | Status: DC

## 2014-11-21 MED ORDER — ALPRAZOLAM 0.5 MG PO TABS: 0.5000 mg | ORAL_TABLET | Freq: Every day | ORAL | Status: DC

## 2014-11-21 NOTE — Progress Notes (Addendum)
  Subjective:    Patient here for follow-up of elevated blood pressure.  She is not exercising and is adherent to a low-salt diet.  Blood pressure is not well controlled at home. Cardiac symptoms: none. Patient denies: chest pain, chest pressure/discomfort, claudication, dyspnea, exertional chest pressure/discomfort, fatigue, irregular heart beat, lower extremity edema, near-syncope, orthopnea, palpitations, paroxysmal nocturnal dyspnea, syncope and tachypnea. Cardiovascular risk factors: hypertension, obesity (BMI >= 30 kg/m2) and sedentary lifestyle. Use of agents associated with hypertension: none. History of target organ damage: none. Pt also c/o trouble sleeping secondary to stress-- job, moving etc. The following portions of the patient's history were reviewed and updated as appropriate: allergies, current medications, past family history, past medical history, past social history, past surgical history and problem list.  Review of Systems Pertinent items are noted in HPI.     Objective:    BP 156/106 mmHg  Pulse 76  Temp(Src) 97.8 F (36.6 C) (Oral)  Wt 198 lb 9.6 oz (90.084 kg)  SpO2 97%  LMP 04/18/2012 General appearance: alert, cooperative, appears stated age and no distress Neck: no adenopathy, supple, symmetrical, trachea midline and thyroid not enlarged, symmetric, no tenderness/mass/nodules Lungs: clear to auscultation bilaterally Heart: S1, S2 normal--  +murmur Extremities: extremities normal, atraumatic, no cyanosis or edema    Assessment:    Hypertension, uncontrolled . Evidence of target organ damage: none.    Plan:    Medication: increase to 100 mg bid. Dietary sodium restriction. Check blood pressures 2-3 times weekly and record. Follow up: 3 weeks and as needed.    1. Essential hypertension   - metoprolol (LOPRESSOR) 100 MG tablet; Take 1 tablet (100 mg total) by mouth 2 (two) times daily.  Dispense: 60 tablet; Refill: 5  2. Insomnia   - ALPRAZolam  (XANAX) 0.5 MG tablet; Take 1 tablet (0.5 mg total) by mouth at bedtime.  Dispense: 30 tablet; Refill: 0

## 2014-11-21 NOTE — Progress Notes (Signed)
Pre visit review using our clinic review tool, if applicable. No additional management support is needed unless otherwise documented below in the visit note. 

## 2014-11-21 NOTE — Patient Instructions (Signed)

## 2014-12-04 ENCOUNTER — Ambulatory Visit (INDEPENDENT_AMBULATORY_CARE_PROVIDER_SITE_OTHER): Payer: Managed Care, Other (non HMO) | Admitting: Family Medicine

## 2014-12-04 ENCOUNTER — Encounter: Payer: Self-pay | Admitting: Family Medicine

## 2014-12-04 VITALS — BP 138/78 | HR 70 | Temp 98.1°F | Resp 18 | Wt 195.2 lb

## 2014-12-04 DIAGNOSIS — I10 Essential (primary) hypertension: Secondary | ICD-10-CM

## 2014-12-04 NOTE — Progress Notes (Signed)
Pre visit review using our clinic review tool, if applicable. No additional management support is needed unless otherwise documented below in the visit note. 

## 2014-12-04 NOTE — Progress Notes (Signed)
  Subjective:    Patient here for follow-up of elevated blood pressure.  She is exercising and is adherent to a low-salt diet.  Blood pressure is well controlled at home. Cardiac symptoms: none. Patient denies: chest pain, chest pressure/discomfort, claudication, dyspnea, exertional chest pressure/discomfort, fatigue, irregular heart beat, lower extremity edema, near-syncope, orthopnea, palpitations, paroxysmal nocturnal dyspnea, syncope and tachypnea. Cardiovascular risk factors: female gender and obesity (BMI >= 30 kg/m2). Use of agents associated with hypertension: none. History of target organ damage: none.  The following portions of the patient's history were reviewed and updated as appropriate:  She  has a past medical history of Allergy; Asthma; Hypertension; Herpes genitalia; H/O: myomectomy; GERD (gastroesophageal reflux disease); Heart murmur; TIA (transient ischemic attack); Refusal of blood transfusions as patient is Jehovah's Witness; and Hearing impaired. She  does not have any pertinent problems on file. She  has past surgical history that includes Myomectomy (2004  ); Ovarian cyst removal (2010); Breast lumpectomy (01/06/11); Robotic assisted lap vaginal hysterectomy; and Cystoscopy (05/19/2012). Her family history includes Arthritis in her mother; Asthma in her mother; Cancer (age of onset: 32) in her other; Cancer (age of onset: 54) in her maternal grandmother; Diabetes in her brother; Heart disease in her maternal grandmother; Hypertension in her father; Migraines in her sister. She  reports that she has never smoked. She has never used smokeless tobacco. She reports that she does not drink alcohol or use illicit drugs. She has a current medication list which includes the following prescription(s): albuterol, alprazolam, aspirin, metoprolol, multiple vitamins-calcium, and valacyclovir. Current Outpatient Prescriptions on File Prior to Visit  Medication Sig Dispense Refill  . albuterol  (PROVENTIL HFA;VENTOLIN HFA) 108 (90 BASE) MCG/ACT inhaler Inhale 2 puffs into the lungs every 6 (six) hours as needed for wheezing. 1 Inhaler 2  . ALPRAZolam (XANAX) 0.5 MG tablet Take 1 tablet (0.5 mg total) by mouth at bedtime. 30 tablet 0  . aspirin 325 MG tablet Take 1 tablet (325 mg total) by mouth daily.    . metoprolol (LOPRESSOR) 100 MG tablet Take 1 tablet (100 mg total) by mouth 2 (two) times daily. 60 tablet 5  . Multiple Vitamins-Calcium (ONE-A-DAY WOMENS PO) Take 1 tablet by mouth daily.    . valACYclovir (VALTREX) 500 MG tablet Take 1 tablet by mouth daily.     No current facility-administered medications on file prior to visit.   She has No Known Allergies..  Review of Systems Pertinent items are noted in HPI.     Objective:    BP 138/78 mmHg  Pulse 70  Temp(Src) 98.1 F (36.7 C) (Oral)  Resp 18  Wt 195 lb 3.2 oz (88.542 kg)  SpO2 98%  LMP 04/18/2012 General appearance: alert, cooperative, appears stated age and no distress Throat: lips, mucosa, and tongue normal; teeth and gums normal Neck: no adenopathy, supple, symmetrical, trachea midline and thyroid not enlarged, symmetric, no tenderness/mass/nodules Lungs: clear to auscultation bilaterally Heart: S1, S2 normal Extremities: extremities normal, atraumatic, no cyanosis or edema    Assessment:    Hypertension, normal blood pressure . Evidence of target organ damage: none.    Plan:    Medication: no change. Regular aerobic exercise. Check blood pressures 2-3 times weekly and record. Follow up: 3 months and as needed.

## 2014-12-04 NOTE — Patient Instructions (Signed)

## 2014-12-05 ENCOUNTER — Ambulatory Visit: Payer: Managed Care, Other (non HMO) | Admitting: Family Medicine

## 2014-12-22 ENCOUNTER — Encounter: Payer: Self-pay | Admitting: Family

## 2014-12-22 ENCOUNTER — Other Ambulatory Visit (HOSPITAL_COMMUNITY)
Admission: RE | Admit: 2014-12-22 | Discharge: 2014-12-22 | Disposition: A | Discharge status: Home / Self Care | Payer: Managed Care, Other (non HMO) | Source: Ambulatory Visit | Attending: Family | Admitting: Family

## 2014-12-22 ENCOUNTER — Ambulatory Visit (INDEPENDENT_AMBULATORY_CARE_PROVIDER_SITE_OTHER): Payer: Managed Care, Other (non HMO) | Admitting: Family

## 2014-12-22 VITALS — BP 142/80 | HR 66 | Temp 97.9°F | Resp 16 | Ht 63.0 in | Wt 196.0 lb

## 2014-12-22 DIAGNOSIS — N760 Acute vaginitis: Secondary | ICD-10-CM | POA: Insufficient documentation

## 2014-12-22 DIAGNOSIS — A499 Bacterial infection, unspecified: Secondary | ICD-10-CM

## 2014-12-22 DIAGNOSIS — Z113 Encounter for screening for infections with a predominantly sexual mode of transmission: Secondary | ICD-10-CM | POA: Diagnosis not present

## 2014-12-22 DIAGNOSIS — B9689 Other specified bacterial agents as the cause of diseases classified elsewhere: Secondary | ICD-10-CM

## 2014-12-22 DIAGNOSIS — N898 Other specified noninflammatory disorders of vagina: Secondary | ICD-10-CM

## 2014-12-22 LAB — CERVICOVAGINAL ANCILLARY ONLY
WET PREP (BD AFFIRM): POSITIVE — AB
Wet Prep (BD Affirm): NEGATIVE
Wet Prep (BD Affirm): NEGATIVE

## 2014-12-22 NOTE — Progress Notes (Signed)
Pre visit review using our clinic review tool, if applicable. No additional management support is needed unless otherwise documented below in the visit note. 

## 2014-12-22 NOTE — Patient Instructions (Signed)
We will contact you with the results of your lab work. Always use condoms. Call if symptoms worsen or if not improved in 1 week.

## 2014-12-22 NOTE — Progress Notes (Signed)
Subjective:    Patient ID: [name], female    DOB: Apr 15, 1971, 44 y.o.   MRN: [id number]  HPI  Ms. He is a 44 yr old female who presents today with chief complaint of vaginal discharge. She reports discharge has been present x 1 week.  Reports that she had itching but that has improved.  New partner, no condoms.  Denies fevers or unusual pelvic pain.  2 partners last 6 mos. No OTC measures.   Review of Systems See HPI  Past Medical History  Diagnosis Date  . Allergy   . Asthma   . Hypertension     pulmonary  . Herpes genitalia   . H/O: myomectomy   . GERD (gastroesophageal reflux disease)     no meds  . Heart murmur   . TIA (transient ischemic attack)   . Refusal of blood transfusions as patient is Jehovah's Witness   . Hearing impaired     can hear on left side only    History   Social History  . Marital Status: Single    Spouse Name: N/A    Number of Children: N/A  . Years of Education: N/A   Occupational History  . Not on file.   Social History Main Topics  . Smoking status: Never Smoker   . Smokeless tobacco: Never Used  . Alcohol Use: No  . Drug Use: No  . Sexual Activity:    Partners: Male    Birth Control/ Protection: Surgical     Comment: hyst   Other Topics Concern  . Not on file   Social History Narrative   Exercise---walk, zumba, Conservation officer, nature video    Past Surgical History  Procedure Laterality Date  . Myomectomy  2004      2010 robotic  . Ovarian cyst removal  2010    x2  . Breast lumpectomy  01/06/11  . Robotic assisted lap vaginal hysterectomy    . Cystoscopy  05/19/2012    Procedure: CYSTOSCOPY;  Surgeon: Alwyn Pea, MD;  Location: [facility];  Service: Gynecology;  Laterality: N/A;    Family History  Problem Relation Age of Onset  . Asthma Mother   . Arthritis Mother   . Hypertension Father   . Heart disease Maternal Grandmother     PCI  . Cancer Maternal Grandmother 63    breast  . Cancer Other 60   breast and possible colon  . Diabetes Brother   . Migraines Sister     No Known Allergies  Current Outpatient Prescriptions on File Prior to Visit  Medication Sig Dispense Refill  . albuterol (PROVENTIL HFA;VENTOLIN HFA) 108 (90 BASE) MCG/ACT inhaler Inhale 2 puffs into the lungs every 6 (six) hours as needed for wheezing. 1 Inhaler 2  . ALPRAZolam (XANAX) 0.5 MG tablet Take 1 tablet (0.5 mg total) by mouth at bedtime. 30 tablet 0  . aspirin 325 MG tablet Take 1 tablet (325 mg total) by mouth daily.    . metoprolol (LOPRESSOR) 100 MG tablet Take 1 tablet (100 mg total) by mouth 2 (two) times daily. 60 tablet 5  . Multiple Vitamins-Calcium (ONE-A-DAY WOMENS PO) Take 1 tablet by mouth daily.    . valACYclovir (VALTREX) 500 MG tablet Take 1 tablet by mouth daily.     No current facility-administered medications on file prior to visit.    BP 142/80 mmHg  Pulse 66  Temp(Src) 97.9 F (36.6 C) (Oral)  Resp 16  Ht 5\' 3"  (1.6  m)  Wt 196 lb (88.905 kg)  BMI 34.73 kg/m2  SpO2 99%  LMP 04/18/2012       Objective:   Physical Exam  Constitutional: She appears well-developed and well-nourished.  Cardiovascular: Normal rate, regular rhythm and normal heart sounds.   No murmur heard. Pulmonary/Chest: Effort normal and breath sounds normal. No respiratory distress. She has no wheezes.  Genitourinary: There is no rash on the right labia. There is no rash on the left labia.  Psychiatric: She has a normal mood and affect. Her behavior is normal. Judgment and thought content normal.          Assessment & Plan:

## 2014-12-24 ENCOUNTER — Encounter: Payer: Self-pay | Admitting: Family

## 2014-12-24 DIAGNOSIS — N760 Acute vaginitis: Secondary | ICD-10-CM

## 2014-12-24 DIAGNOSIS — B9689 Other specified bacterial agents as the cause of diseases classified elsewhere: Secondary | ICD-10-CM | POA: Insufficient documentation

## 2014-12-24 MED ORDER — METRONIDAZOLE 500 MG PO TABS: 500.0000 mg | ORAL_TABLET | Freq: Two times a day (BID) | ORAL | Status: DC

## 2014-12-24 NOTE — Assessment & Plan Note (Signed)
Will rx with metronidazole, see phone note.

## 2014-12-24 NOTE — Telephone Encounter (Signed)
See my chart message

## 2014-12-25 LAB — CERVICOVAGINAL ANCILLARY ONLY
CHLAMYDIA, DNA PROBE: NEGATIVE
Neisseria Gonorrhea: NEGATIVE

## 2014-12-26 ENCOUNTER — Encounter: Payer: Self-pay | Admitting: Family

## 2015-01-14 ENCOUNTER — Emergency Department (HOSPITAL_BASED_OUTPATIENT_CLINIC_OR_DEPARTMENT_OTHER)
Admission: EM | Admit: 2015-01-14 | Discharge: 2015-01-14 | Discharge status: Against Medical Advice | Payer: Managed Care, Other (non HMO) | Attending: Emergency Medicine | Admitting: Emergency Medicine

## 2015-01-14 ENCOUNTER — Encounter (HOSPITAL_BASED_OUTPATIENT_CLINIC_OR_DEPARTMENT_OTHER): Payer: Self-pay | Admitting: *Deleted

## 2015-01-14 DIAGNOSIS — Z8669 Personal history of other diseases of the nervous system and sense organs: Secondary | ICD-10-CM | POA: Diagnosis not present

## 2015-01-14 DIAGNOSIS — J45909 Unspecified asthma, uncomplicated: Secondary | ICD-10-CM | POA: Insufficient documentation

## 2015-01-14 DIAGNOSIS — R51 Headache: Secondary | ICD-10-CM | POA: Diagnosis not present

## 2015-01-14 DIAGNOSIS — Z8719 Personal history of other diseases of the digestive system: Secondary | ICD-10-CM | POA: Diagnosis not present

## 2015-01-14 DIAGNOSIS — Z5320 Procedure and treatment not carried out because of patient's decision for unspecified reasons: Secondary | ICD-10-CM

## 2015-01-14 DIAGNOSIS — R011 Cardiac murmur, unspecified: Secondary | ICD-10-CM | POA: Insufficient documentation

## 2015-01-14 DIAGNOSIS — Z8673 Personal history of transient ischemic attack (TIA), and cerebral infarction without residual deficits: Secondary | ICD-10-CM | POA: Diagnosis not present

## 2015-01-14 DIAGNOSIS — Z9889 Other specified postprocedural states: Secondary | ICD-10-CM | POA: Diagnosis not present

## 2015-01-14 DIAGNOSIS — Z79899 Other long term (current) drug therapy: Secondary | ICD-10-CM | POA: Insufficient documentation

## 2015-01-14 DIAGNOSIS — Z7982 Long term (current) use of aspirin: Secondary | ICD-10-CM | POA: Diagnosis not present

## 2015-01-14 DIAGNOSIS — Z8619 Personal history of other infectious and parasitic diseases: Secondary | ICD-10-CM | POA: Diagnosis not present

## 2015-01-14 DIAGNOSIS — N390 Urinary tract infection, site not specified: Secondary | ICD-10-CM | POA: Insufficient documentation

## 2015-01-14 DIAGNOSIS — I10 Essential (primary) hypertension: Secondary | ICD-10-CM | POA: Insufficient documentation

## 2015-01-14 DIAGNOSIS — Z9119 Patient's noncompliance with other medical treatment and regimen: Secondary | ICD-10-CM

## 2015-01-14 LAB — URINE MICROSCOPIC-ADD ON

## 2015-01-14 LAB — URINALYSIS, ROUTINE W REFLEX MICROSCOPIC
BILIRUBIN URINE: NEGATIVE
Glucose, UA: NEGATIVE mg/dL
Hgb urine dipstick: NEGATIVE
Ketones, ur: NEGATIVE mg/dL
Nitrite: NEGATIVE
PH: 6.5 (ref 5.0–8.0)
PROTEIN: NEGATIVE mg/dL
Specific Gravity, Urine: 1.01 (ref 1.005–1.030)
UROBILINOGEN UA: 2 mg/dL — AB (ref 0.0–1.0)

## 2015-01-14 MED ORDER — IBUPROFEN 800 MG PO TABS
800.0000 mg | ORAL_TABLET | Freq: Once | ORAL | Status: AC
  Administered 2015-01-14: 800 mg via ORAL
  Filled 2015-01-14: qty 1

## 2015-01-14 NOTE — ED Notes (Signed)
Patient stated to secretary that she had waited long enough and was leaving. Did not want to discuss with MD.

## 2015-01-14 NOTE — ED Notes (Addendum)
[facility] - pelvic/lower back pain and dysuria - [facility] pt was noted to be hypertensive w/ BP of 180/100 and pt admits to HA as well. Pt sent over from clinic for further evaluation. Pt admits to hx of hypertension and has been taking her prescribed medications.

## 2015-01-14 NOTE — ED Provider Notes (Signed)
CSN: [id number]     Arrival date & time 01/14/15  1531 History   First MD Initiated Contact with Patient 01/14/15 1827     Chief Complaint  Patient presents with  . Hypertension  . Headache  . Urinary Tract Infection     (Consider location/radiation/quality/duration/timing/severity/associated sxs/prior Treatment) HPI  Past Medical History  Diagnosis Date  . Allergy   . Asthma   . Hypertension     pulmonary  . Herpes genitalia   . H/O: myomectomy   . GERD (gastroesophageal reflux disease)     no meds  . Heart murmur   . TIA (transient ischemic attack)   . Refusal of blood transfusions as patient is Jehovah's Witness   . Hearing impaired     can hear on left side only   Past Surgical History  Procedure Laterality Date  . Myomectomy  2004      2010 robotic  . Ovarian cyst removal  2010    x2  . Breast lumpectomy  01/06/11  . Robotic assisted lap vaginal hysterectomy    . Cystoscopy  05/19/2012    Procedure: CYSTOSCOPY;  Surgeon: Alwyn Pea, MD;  Location: [facility];  Service: Gynecology;  Laterality: N/A;   Family History  Problem Relation Age of Onset  . Asthma Mother   . Arthritis Mother   . Hypertension Father   . Heart disease Maternal Grandmother     PCI  . Cancer Maternal Grandmother 57    breast  . Cancer Other 60    breast and possible colon  . Diabetes Brother   . Migraines Sister    History  Substance Use Topics  . Smoking status: Never Smoker   . Smokeless tobacco: Never Used  . Alcohol Use: No   OB History    Gravida Para Term Preterm AB TAB SAB Ectopic Multiple Living   1 1        1      Review of Systems    Allergies  Review of patient's allergies indicates no known allergies.  Home Medications   Prior to Admission medications   Medication Sig Start Date End Date Taking? Authorizing Provider  aspirin 325 MG tablet Take 1 tablet (325 mg total) by mouth daily. 11/10/12  Yes Estela Leonie Green, MD  metoprolol (LOPRESSOR) 100  MG tablet Take 1 tablet (100 mg total) by mouth 2 (two) times daily. 11/21/14  Yes Rosalita Chessman, DO  Multiple Vitamins-Calcium (ONE-A-DAY WOMENS PO) Take 1 tablet by mouth daily.   Yes Historical Provider, MD  valACYclovir (VALTREX) 500 MG tablet Take 1 tablet by mouth daily. 06/02/13  Yes Historical Provider, MD  albuterol (PROVENTIL HFA;VENTOLIN HFA) 108 (90 BASE) MCG/ACT inhaler Inhale 2 puffs into the lungs every 6 (six) hours as needed for wheezing. 06/09/13   Rosalita Chessman, DO  ALPRAZolam Duanne Moron) 0.5 MG tablet Take 1 tablet (0.5 mg total) by mouth at bedtime. 11/21/14   Rosalita Chessman, DO  metroNIDAZOLE (FLAGYL) 500 MG tablet Take 1 tablet (500 mg total) by mouth 2 (two) times daily. 12/24/14   Debbrah Alar, NP   BP 173/97 mmHg  Pulse 78  Temp(Src) 98.5 F (36.9 C) (Oral)  Resp 16  Ht 5\' 3"  (1.6 m)  Wt 192 lb (87.091 kg)  BMI 34.02 kg/m2  SpO2 100%  LMP 04/18/2012 Physical Exam  ED Course  Procedures (including critical care time) Labs Review Labs Reviewed  URINALYSIS, ROUTINE W REFLEX MICROSCOPIC - Abnormal; Notable for the  following:    Urobilinogen, UA 2.0 (*)    Leukocytes, UA TRACE (*)    All other components within normal limits  URINE MICROSCOPIC-ADD ON - Abnormal; Notable for the following:    Squamous Epithelial / LPF MANY (*)    Bacteria, UA MANY (*)    All other components within normal limits    Imaging Review No results found.   EKG Interpretation None      MDM   Final diagnoses:  Left before treatment completed    .  Patient left before being seen after triage    Ernestina Patches, MD 01/14/15 [phone]

## 2015-01-15 ENCOUNTER — Ambulatory Visit (INDEPENDENT_AMBULATORY_CARE_PROVIDER_SITE_OTHER): Payer: Managed Care, Other (non HMO) | Admitting: Physician Assistant

## 2015-01-15 ENCOUNTER — Encounter: Payer: Self-pay | Admitting: Physician Assistant

## 2015-01-15 VITALS — BP 172/102 | HR 57 | Temp 98.1°F | Resp 16 | Ht 63.0 in | Wt 192.2 lb

## 2015-01-15 DIAGNOSIS — I10 Essential (primary) hypertension: Secondary | ICD-10-CM

## 2015-01-15 DIAGNOSIS — R399 Unspecified symptoms and signs involving the genitourinary system: Secondary | ICD-10-CM | POA: Insufficient documentation

## 2015-01-15 LAB — BASIC METABOLIC PANEL
BUN: 6 mg/dL (ref 6–23)
CO2: 27 mEq/L (ref 19–32)
Calcium: 9.7 mg/dL (ref 8.4–10.5)
Chloride: 106 mEq/L (ref 96–112)
Creatinine, Ser: 0.84 mg/dL (ref 0.40–1.20)
GFR: 95.05 mL/min (ref >60.00)
GLUCOSE: 80 mg/dL (ref 70–99)
POTASSIUM: 4 meq/L (ref 3.5–5.1)
Sodium: 140 mEq/L (ref 135–145)

## 2015-01-15 LAB — POCT URINALYSIS DIPSTICK
BILIRUBIN UA: NEGATIVE
GLUCOSE UA: NEGATIVE
Ketones, UA: NEGATIVE
NITRITE UA: NEGATIVE
Protein, UA: NEGATIVE
RBC UA: NEGATIVE
SPEC GRAV UA: 1.015
pH, UA: 6

## 2015-01-15 LAB — CBC WITH DIFFERENTIAL/PLATELET
BASOS ABS: 0.1 10*3/uL (ref 0.0–0.1)
Basophils Relative: 0.8 % (ref 0.0–3.0)
Eosinophils Absolute: 0.1 10*3/uL (ref 0.0–0.7)
Eosinophils Relative: 2.2 % (ref 0.0–5.0)
HCT: 39.3 % (ref 36.0–46.0)
Hemoglobin: 13.2 g/dL (ref 12.0–15.0)
LYMPHS ABS: 3.3 10*3/uL (ref 0.7–4.0)
Lymphocytes Relative: 53.7 % — ABNORMAL HIGH (ref 12.0–46.0)
MCHC: 33.5 g/dL (ref 30.0–36.0)
MCV: 89.4 fl (ref 78.0–100.0)
Monocytes Absolute: 0.4 10*3/uL (ref 0.1–1.0)
Monocytes Relative: 6.8 % (ref 3.0–12.0)
Neutro Abs: 2.2 10*3/uL (ref 1.4–7.7)
Neutrophils Relative %: 36.5 % — ABNORMAL LOW (ref 43.0–77.0)
Platelets: 314 10*3/uL (ref 150.0–400.0)
RBC: 4.39 Mil/uL (ref 3.87–5.11)
RDW: 13.4 % (ref 11.5–15.5)
WBC: 6.2 10*3/uL (ref 4.0–10.5)

## 2015-01-15 MED ORDER — CIPROFLOXACIN HCL 500 MG PO TABS: 500.0000 mg | ORAL_TABLET | Freq: Two times a day (BID) | ORAL | Status: DC

## 2015-01-15 MED ORDER — LISINOPRIL 10 MG PO TABS: 10.0000 mg | ORAL_TABLET | Freq: Every day | ORAL | Status: DC

## 2015-01-15 NOTE — Patient Instructions (Signed)
Please take your antibiotic as directed and stay well-hydrated.  I will call you with all of your results.  Please start the Lisinopril in addition to your Metoprolol.  Read the information below on the DASH diet for high blood pressure. Stay well hydrated.  Alternate tylenol and ibuprofen as needed for headache.  The headache should resolve as BP improves.  Follow-up in 1-2 weeks with myself or Dr. Etter Sjogren for reassessment of your BP.  DASH Eating Plan DASH stands for "Dietary Approaches to Stop Hypertension." The DASH eating plan is a healthy eating plan that has been shown to reduce high blood pressure (hypertension). Additional health benefits may include reducing the risk of type 2 diabetes mellitus, heart disease, and stroke. The DASH eating plan may also help with weight loss. WHAT DO I NEED TO KNOW ABOUT THE DASH EATING PLAN? For the DASH eating plan, you will follow these general guidelines:  Choose foods with a percent daily value for sodium of less than 5% (as listed on the food label).  Use salt-free seasonings or herbs instead of table salt or sea salt.  Check with your health care provider or pharmacist before using salt substitutes.  Eat lower-sodium products, often labeled as "lower sodium" or "no salt added."  Eat fresh foods.  Eat more vegetables, fruits, and low-fat dairy products.  Choose whole grains. Look for the word "whole" as the first word in the ingredient list.  Choose fish and skinless chicken or [place] more often than red meat. Limit fish, poultry, and meat to 6 oz (170 g) each day.  Limit sweets, desserts, sugars, and sugary drinks.  Choose heart-healthy fats.  Limit cheese to 1 oz (28 g) per day.  Eat more home-cooked food and less restaurant, buffet, and fast food.  Limit fried foods.  Cook foods using methods other than frying.  Limit canned vegetables. If you do use them, rinse them well to decrease the sodium.  When eating at a restaurant,  ask that your food be prepared with less salt, or no salt if possible. WHAT FOODS CAN I EAT? Seek help from a dietitian for individual calorie needs. Grains Whole grain or whole wheat bread. Brown rice. Whole grain or whole wheat pasta. Quinoa, bulgur, and whole grain cereals. Low-sodium cereals. Corn or whole wheat flour tortillas. Whole grain cornbread. Whole grain crackers. Low-sodium crackers. Vegetables Fresh or frozen vegetables (raw, steamed, roasted, or grilled). Low-sodium or reduced-sodium tomato and vegetable juices. Low-sodium or reduced-sodium tomato sauce and paste. Low-sodium or reduced-sodium canned vegetables.  Fruits All fresh, canned (in natural juice), or frozen fruits. Meat and Other Protein Products Ground beef (85% or leaner), grass-fed beef, or beef trimmed of fat. Skinless chicken or [place]. Ground chicken or [place]. Pork trimmed of fat. All fish and seafood. Eggs. Dried beans, peas, or lentils. Unsalted nuts and seeds. Unsalted canned beans. Dairy Low-fat dairy products, such as skim or 1% milk, 2% or reduced-fat cheeses, low-fat ricotta or cottage cheese, or plain low-fat yogurt. Low-sodium or reduced-sodium cheeses. Fats and Oils Tub margarines without trans fats. Light or reduced-fat mayonnaise and salad dressings (reduced sodium). Avocado. Safflower, olive, or canola oils. Natural peanut or almond butter. Other Unsalted popcorn and pretzels. The items listed above may not be a complete list of recommended foods or beverages. Contact your dietitian for more options. WHAT FOODS ARE NOT RECOMMENDED? Grains White bread. White pasta. White rice. Refined cornbread. Bagels and croissants. Crackers that contain trans fat. Vegetables Creamed or fried vegetables. Vegetables  in a cheese sauce. Regular canned vegetables. Regular canned tomato sauce and paste. Regular tomato and vegetable juices. Fruits Dried fruits. Canned fruit in light or heavy syrup. Fruit juice. Meat  and Other Protein Products Fatty cuts of meat. Ribs, chicken wings, bacon, sausage, bologna, salami, chitterlings, fatback, hot dogs, bratwurst, and packaged luncheon meats. Salted nuts and seeds. Canned beans with salt. Dairy Whole or 2% milk, cream, half-and-half, and cream cheese. Whole-fat or sweetened yogurt. Full-fat cheeses or blue cheese. Nondairy creamers and whipped toppings. Processed cheese, cheese spreads, or cheese curds. Condiments Onion and garlic salt, seasoned salt, table salt, and sea salt. Canned and packaged gravies. Worcestershire sauce. Tartar sauce. Barbecue sauce. Teriyaki sauce. Soy sauce, including reduced sodium. Steak sauce. Fish sauce. Oyster sauce. Cocktail sauce. Horseradish. Ketchup and mustard. Meat flavorings and tenderizers. Bouillon cubes. Hot sauce. Tabasco sauce. Marinades. Taco seasonings. Relishes. Fats and Oils Butter, stick margarine, lard, shortening, ghee, and bacon fat. Coconut, palm kernel, or palm oils. Regular salad dressings. Other Pickles and olives. Salted popcorn and pretzels. The items listed above may not be a complete list of foods and beverages to avoid. Contact your dietitian for more information. WHERE CAN I FIND MORE INFORMATION? National Heart, Lung, and Blood Institute: travelstabloid.com Document Released: 10/30/2011 Document Revised: 03/27/2014 Document Reviewed: 09/14/2013 [facility] Patient Information 2015 [place], [place]. This information is not intended to replace advice given to you by your health care provider. Make sure you discuss any questions you have with your health care provider.

## 2015-01-15 NOTE — Assessment & Plan Note (Signed)
Urine dip with + LE.  Will send for culture.  Giving typical symptoms of UTI, will empirically treat with Cipro BID x 3 days while waiting on culture.  Increase fluids.  Daily cranberry supplement.  Return precautions discussed with patient.

## 2015-01-15 NOTE — Progress Notes (Signed)
Patient presents to clinic today with multiple complaints.  Patient complains of intermittent headaches over the past week.  Endorses significant elevation in her BP despite taking her Metoprolol twice daily as directed. Denies change in diet.  Is staying well hydrated.  Endorses low salt intake.  Denies swelling of extremities. Has been under more stress lately with work.  Patient also complains of suprapubic pressure, urinary frequency and suprapubic pain x 3 days. Denies hematuria. Is s/p TAH with oophorectomy. Denies change in bowel habits.  Denies fever, chills, nausea or flank pain.  Past Medical History  Diagnosis Date  . Allergy   . Asthma   . Hypertension     pulmonary  . Herpes genitalia   . H/O: myomectomy   . GERD (gastroesophageal reflux disease)     no meds  . Heart murmur   . TIA (transient ischemic attack)   . Refusal of blood transfusions as patient is Jehovah's Witness   . Hearing impaired     can hear on left side only    Current Outpatient Prescriptions on File Prior to Visit  Medication Sig Dispense Refill  . albuterol (PROVENTIL HFA;VENTOLIN HFA) 108 (90 BASE) MCG/ACT inhaler Inhale 2 puffs into the lungs every 6 (six) hours as needed for wheezing. 1 Inhaler 2  . ALPRAZolam (XANAX) 0.5 MG tablet Take 1 tablet (0.5 mg total) by mouth at bedtime. 30 tablet 0  . aspirin 325 MG tablet Take 1 tablet (325 mg total) by mouth daily.    . metoprolol (LOPRESSOR) 100 MG tablet Take 1 tablet (100 mg total) by mouth 2 (two) times daily. 60 tablet 5  . Multiple Vitamins-Calcium (ONE-A-DAY WOMENS PO) Take 1 tablet by mouth daily.    . valACYclovir (VALTREX) 500 MG tablet Take 1 tablet by mouth daily.     No current facility-administered medications on file prior to visit.    No Known Allergies  Family History  Problem Relation Age of Onset  . Asthma Mother   . Arthritis Mother   . Hypertension Father   . Heart disease Maternal Grandmother     PCI  . Cancer  Maternal Grandmother 29    breast  . Cancer Other 60    breast and possible colon  . Diabetes Brother   . Migraines Sister     History   Social History  . Marital Status: Single    Spouse Name: N/A  . Number of Children: N/A  . Years of Education: N/A   Social History Main Topics  . Smoking status: Never Smoker   . Smokeless tobacco: Never Used  . Alcohol Use: No  . Drug Use: No  . Sexual Activity:    Partners: Male    Birth Control/ Protection: Surgical     Comment: hyst   Other Topics Concern  . None   Social History Narrative   Exercise---walk, zumba, Conservation officer, nature video    Review of Systems - See HPI.  All other ROS are negative.  BP 172/102 mmHg  Pulse 57  Temp(Src) 98.1 F (36.7 C) (Oral)  Resp 16  Ht 5\' 3"  (1.6 m)  Wt 192 lb 4 oz (87.204 kg)  BMI 34.06 kg/m2  SpO2 100%  LMP 04/18/2012  Physical Exam  Constitutional: She is oriented to person, place, and time and well-developed, well-nourished, and in no distress.  HENT:  Head: Normocephalic and atraumatic.  Right Ear: External ear normal.  Left Ear: External ear normal.  Nose: Nose normal.  Mouth/Throat:  Oropharynx is clear and moist. No oropharyngeal exudate.  TM within normal limits bilaterally.  Eyes: Conjunctivae are normal. Pupils are equal, round, and reactive to light.  Neck: Neck supple.  Cardiovascular: Normal rate, regular rhythm, normal heart sounds and intact distal pulses.   Pulmonary/Chest: Effort normal and breath sounds normal. No respiratory distress. She has no wheezes. She has no rales. She exhibits no tenderness.  Abdominal: Soft. Normal appearance and bowel sounds are normal. There is hepatosplenomegaly. There is tenderness in the suprapubic area. There is no CVA tenderness.  Lymphadenopathy:    She has no cervical adenopathy.  Neurological: She is alert and oriented to person, place, and time.  Skin: Skin is warm and dry. No rash noted.  Psychiatric: Affect normal.    Vitals reviewed.   Recent Results (from the past 2160 hour(s))  Cervicovaginal ancillary only     Status: None   Collection Time: 12/22/14 12:00 AM  Result Value Ref Range   Chlamydia CT: Negative     Comment: Normal Reference Range - Negative   Neisseria gonorrhea NG: Negative     Comment: Normal Reference Range - Negative  Cervicovaginal ancillary only     Status: Abnormal   Collection Time: 12/22/14 12:00 AM  Result Value Ref Range   Wet Prep (BD Affirm) Candida: Negative     Comment: Normal Reference Range - Negative   Wet Prep (BD Affirm) **Gardnerella: POSITIVE** (A)     Comment: Normal Reference Range - Negative   Wet Prep (BD Affirm) Trichomonas: Negative     Comment: Normal Reference Range - Negative  Urinalysis, Routine w reflex microscopic     Status: Abnormal   Collection Time: 01/14/15  4:00 PM  Result Value Ref Range   Color, Urine YELLOW YELLOW   APPearance CLEAR CLEAR   Specific Gravity, Urine 1.010 1.005 - 1.030   pH 6.5 5.0 - 8.0   Glucose, UA NEGATIVE NEGATIVE mg/dL   Hgb urine dipstick NEGATIVE NEGATIVE   Bilirubin Urine NEGATIVE NEGATIVE   Ketones, ur NEGATIVE NEGATIVE mg/dL   Protein, ur NEGATIVE NEGATIVE mg/dL   Urobilinogen, UA 2.0 (H) 0.0 - 1.0 mg/dL   Nitrite NEGATIVE NEGATIVE   Leukocytes, UA TRACE (A) NEGATIVE  Urine microscopic-add on     Status: Abnormal   Collection Time: 01/14/15  4:00 PM  Result Value Ref Range   Squamous Epithelial / LPF MANY (A) RARE   WBC, UA 0-2 <3 WBC/hpf   RBC / HPF 0-2 <3 RBC/hpf   Bacteria, UA MANY (A) RARE  POCT urinalysis dipstick     Status: None   Collection Time: 01/15/15 11:20 AM  Result Value Ref Range   Color, UA gold    Clarity, UA cloudy    Glucose, UA neg    Bilirubin, UA neg    Ketones, UA neg    Spec Grav, UA 1.015    Blood, UA neg    pH, UA 6.0    Protein, UA neg    Urobilinogen, UA >=8.0    Nitrite, UA neg    Leukocytes, UA Trace     Assessment/Plan: UTI symptoms Urine dip with +  LE.  Will send for culture.  Giving typical symptoms of UTI, will empirically treat with Cipro BID x 3 days while waiting on culture.  Increase fluids.  Daily cranberry supplement.  Return precautions discussed with patient.   Essential hypertension Uncontrolled. Discussed options.  Patient states she had bad side effect of HCTZ. Will add on Lisinopril  10 mg daily to current regimen. DASH diet encouraged.  Alternate tylenol and ibuprofen for headache. Should resolve as BP becomes normotensive. Follow-up in 1-2 weeks. Return sooner if needed.

## 2015-01-15 NOTE — Assessment & Plan Note (Signed)
Uncontrolled. Discussed options.  Patient states she had bad side effect of HCTZ. Will add on Lisinopril 10 mg daily to current regimen. DASH diet encouraged.  Alternate tylenol and ibuprofen for headache. Should resolve as BP becomes normotensive. Follow-up in 1-2 weeks. Return sooner if needed.

## 2015-01-17 LAB — CULTURE, URINE COMPREHENSIVE
Colony Count: NO GROWTH
ORGANISM ID, BACTERIA: NO GROWTH

## 2015-01-29 ENCOUNTER — Encounter: Payer: Self-pay | Admitting: Family Medicine

## 2015-01-29 ENCOUNTER — Ambulatory Visit (INDEPENDENT_AMBULATORY_CARE_PROVIDER_SITE_OTHER): Payer: Managed Care, Other (non HMO) | Admitting: Family Medicine

## 2015-01-29 VITALS — BP 142/82 | HR 69 | Temp 97.9°F | Wt 194.2 lb

## 2015-01-29 DIAGNOSIS — I10 Essential (primary) hypertension: Secondary | ICD-10-CM

## 2015-01-29 MED ORDER — LISINOPRIL 20 MG PO TABS
20.0000 mg | ORAL_TABLET | Freq: Every day | ORAL | Status: DC
Start: 2015-01-29 — End: 2015-03-06

## 2015-01-29 NOTE — Patient Instructions (Signed)

## 2015-01-29 NOTE — Progress Notes (Signed)
Pre visit review using our clinic review tool, if applicable. No additional management support is needed unless otherwise documented below in the visit note. 

## 2015-01-29 NOTE — Progress Notes (Signed)
   Subjective:    Patient ID: [name], female    DOB: 1971-04-25, 44 y.o.   MRN: [id number]  HPI  Patient here for f/u bp.  Past Medical History  Diagnosis Date  . Allergy   . Asthma   . Hypertension     pulmonary  . Herpes genitalia   . H/O: myomectomy   . GERD (gastroesophageal reflux disease)     no meds  . Heart murmur   . TIA (transient ischemic attack)   . Refusal of blood transfusions as patient is Jehovah's Witness   . Hearing impaired     can hear on left side only    Review of Systems  Constitutional: Negative for activity change, appetite change, fatigue and unexpected weight change.  Respiratory: Negative for cough and shortness of breath.   Cardiovascular: Negative for chest pain and palpitations.  Psychiatric/Behavioral: Negative for behavioral problems and dysphoric mood. The patient is not nervous/anxious.        Objective:    Physical Exam  Constitutional: She is oriented to person, place, and time. She appears well-developed and well-nourished. No distress.  HENT:  Head: Normocephalic and atraumatic.  Eyes: Conjunctivae and EOM are normal. Pupils are equal, round, and reactive to light.  Neck: Normal range of motion. Neck supple. No thyromegaly present.  Cardiovascular: Normal rate, regular rhythm, normal heart sounds and intact distal pulses.   No murmur heard. Pulmonary/Chest: Effort normal and breath sounds normal. No respiratory distress.  Abdominal: Soft. She exhibits no distension. There is no tenderness.  Musculoskeletal: She exhibits no edema.  Lymphadenopathy:    She has no cervical adenopathy.  Neurological: She is alert and oriented to person, place, and time.  Skin: Skin is warm and dry.  Psychiatric: She has a normal mood and affect. Her behavior is normal.  Nursing note and vitals reviewed.   BP 142/82 mmHg  Pulse 69  Temp(Src) 97.9 F (36.6 C) (Oral)  Wt 194 lb 3.2 oz (88.089 kg)  SpO2 100%  LMP 04/18/2012 Wt  Readings from Last 3 Encounters:  01/29/15 194 lb 3.2 oz (88.089 kg)  01/15/15 192 lb 4 oz (87.204 kg)  01/14/15 192 lb (87.091 kg)     Lab Results  Component Value Date   WBC 6.2 01/15/2015   HGB 13.2 01/15/2015   HCT 39.3 01/15/2015   PLT 314.0 01/15/2015   GLUCOSE 80 01/15/2015   CHOL 130 11/10/2012   TRIG 124 11/10/2012   HDL 52 11/10/2012   LDLCALC 53 11/10/2012   ALT 13 11/09/2012   AST 16 11/09/2012   NA 140 01/15/2015   K 4.0 01/15/2015   CL 106 01/15/2015   CREATININE 0.84 01/15/2015   BUN 6 01/15/2015   CO2 27 01/15/2015   TSH 0.55 03/26/2012   INR 1.11 11/09/2012   HGBA1C 5.1 11/10/2012    No results found.     Assessment & Plan:   Problem List Items Addressed This Visit      Unprioritized   Essential hypertension - Primary (Chronic)   Relevant Medications   lisinopril (PRINIVIL,ZESTRIL) tablet    1. Essential hypertension   - lisinopril (PRINIVIL,ZESTRIL) 20 MG tablet; Take 1 tablet (20 mg total) by mouth daily.  Dispense: 90 tablet; Refill: [place], DO

## 2015-02-12 ENCOUNTER — Other Ambulatory Visit: Payer: Self-pay | Admitting: Family

## 2015-02-12 ENCOUNTER — Other Ambulatory Visit: Payer: Self-pay | Admitting: Obstetrics and Gynecology

## 2015-02-12 DIAGNOSIS — R1900 Intra-abdominal and pelvic swelling, mass and lump, unspecified site: Secondary | ICD-10-CM

## 2015-02-13 ENCOUNTER — Telehealth: Payer: Self-pay | Admitting: Family Medicine

## 2015-02-13 NOTE — Telephone Encounter (Signed)
FYI:  Called patient to follow-up on BP.  Patient has been taking her BP regularly with monitor at home and they are ranging from 156/93- 190/98.  Patient denies chest pain or shortness of breath currently.  Patient states that she did have chest tightness last night, but thinks it was due to indigestion.  She has a headache currently but denies blurred vision or lightheadedness.    Notified patient to go to ED if chest pain recurs (even if she thinks it could be indigestion) or if she feels short of breath.  Patient stated understanding.  Appointment scheduled with Mackie Pai, [place] for 02/15/15 (patient could not come 02/14/15).

## 2015-02-13 NOTE — Telephone Encounter (Signed)
Would you please call this patient and Triage.     KP

## 2015-02-13 NOTE — Telephone Encounter (Signed)
Caller name:Trigueros Jordain Relation to AJ:OINO Call back number:[phone] Pharmacy:  Reason for call: pt would like for you to give her a call states her blood pressure is still high and she is having headaches, want to know what else she can do

## 2015-02-14 ENCOUNTER — Telehealth: Payer: Self-pay | Admitting: Family Medicine

## 2015-02-14 NOTE — Telephone Encounter (Signed)
pre visit letter sent °

## 2015-02-15 ENCOUNTER — Ambulatory Visit (INDEPENDENT_AMBULATORY_CARE_PROVIDER_SITE_OTHER): Payer: Managed Care, Other (non HMO) | Admitting: Medical

## 2015-02-15 ENCOUNTER — Encounter: Payer: Self-pay | Admitting: Medical

## 2015-02-15 ENCOUNTER — Other Ambulatory Visit (HOSPITAL_COMMUNITY)
Admission: RE | Admit: 2015-02-15 | Discharge: 2015-02-15 | Disposition: A | Discharge status: Home / Self Care | Payer: Managed Care, Other (non HMO) | Source: Ambulatory Visit | Attending: Medical | Admitting: Medical

## 2015-02-15 VITALS — BP 177/92 | HR 60 | Temp 98.3°F | Ht 63.0 in | Wt 190.2 lb

## 2015-02-15 DIAGNOSIS — N898 Other specified noninflammatory disorders of vagina: Secondary | ICD-10-CM | POA: Diagnosis not present

## 2015-02-15 DIAGNOSIS — Z113 Encounter for screening for infections with a predominantly sexual mode of transmission: Secondary | ICD-10-CM | POA: Diagnosis present

## 2015-02-15 DIAGNOSIS — I10 Essential (primary) hypertension: Secondary | ICD-10-CM | POA: Diagnosis not present

## 2015-02-15 DIAGNOSIS — N760 Acute vaginitis: Secondary | ICD-10-CM | POA: Diagnosis present

## 2015-02-15 LAB — POCT URINALYSIS DIPSTICK
Bilirubin, UA: NEGATIVE
GLUCOSE UA: NEGATIVE
KETONES UA: 15
NITRITE UA: NEGATIVE
Spec Grav, UA: 1.02
Urobilinogen, UA: 1
pH, UA: 6.5

## 2015-02-15 MED ORDER — METRONIDAZOLE 500 MG PO TABS: 500.0000 mg | ORAL_TABLET | Freq: Three times a day (TID) | ORAL | Status: DC

## 2015-02-15 MED ORDER — AMLODIPINE BESYLATE 10 MG PO TABS: 10.0000 mg | ORAL_TABLET | Freq: Every day | ORAL | Status: DC

## 2015-02-15 NOTE — Assessment & Plan Note (Signed)
Also  has itching vaginal area and mild odor. Slight water discharge. This is going on for 3-4 days. Treat you for bv pending urine ancillary. Rx of flagyl.

## 2015-02-15 NOTE — Progress Notes (Signed)
Subjective:    Patient ID: [name], female    DOB: July 10, 1971, 44 y.o.   MRN: [id number]  HPI  Pt in with bp elevated. She is on lisinonoril 20 mg q day put on med since   February . She is also  on metoprolol 100 mg po bid. Pt get some ha on occasion but mild . Some mild fatigue.  LMP- hysterectomy.  No chest pain. Pt is not diabetic. No neuro signs or symptoms except the occasional ha.  Pt has home  bp reading elevated. Ex 163/98. Lowest recent 156/86 on Monday. Other reading 177/93.    Review of Systems  Constitutional: Negative for fever, chills, diaphoresis, activity change and fatigue.  Respiratory: Negative for cough, chest tightness and shortness of breath.   Cardiovascular: Negative for chest pain, palpitations and leg swelling.  Gastrointestinal: Negative for nausea, vomiting and abdominal pain.  Musculoskeletal: Negative for neck pain and neck stiffness.  Neurological: Positive for headaches. Negative for dizziness, tremors, seizures, syncope, facial asymmetry, speech difficulty, weakness, light-headedness and numbness.       Pt ha when has level 3-4/10. Bother enough to bother her focus.  Psychiatric/Behavioral: Negative for behavioral problems, confusion and agitation. The patient is not nervous/anxious.     Past Medical History  Diagnosis Date  . Allergy   . Asthma   . Hypertension     pulmonary  . Herpes genitalia   . H/O: myomectomy   . GERD (gastroesophageal reflux disease)     no meds  . Heart murmur   . TIA (transient ischemic attack)   . Refusal of blood transfusions as patient is Jehovah's Witness   . Hearing impaired     can hear on left side only    History   Social History  . Marital Status: Single    Spouse Name: N/A  . Number of Children: N/A  . Years of Education: N/A   Occupational History  . Not on file.   Social History Main Topics  . Smoking status: Never Smoker   . Smokeless tobacco: Never Used  . Alcohol Use: No  .  Drug Use: No  . Sexual Activity:    Partners: Male    Birth Control/ Protection: Surgical     Comment: hyst   Other Topics Concern  . Not on file   Social History Narrative   Exercise---walk, zumba, Conservation officer, nature video    Past Surgical History  Procedure Laterality Date  . Myomectomy  2004      2010 robotic  . Ovarian cyst removal  2010    x2  . Breast lumpectomy  01/06/11  . Robotic assisted lap vaginal hysterectomy    . Cystoscopy  05/19/2012    Procedure: CYSTOSCOPY;  Surgeon: Alwyn Pea, MD;  Location: [facility];  Service: Gynecology;  Laterality: N/A;    Family History  Problem Relation Age of Onset  . Asthma Mother   . Arthritis Mother   . Hypertension Father   . Heart disease Maternal Grandmother     PCI  . Cancer Maternal Grandmother 15    breast  . Cancer Other 60    breast and possible colon  . Diabetes Brother   . Migraines Sister     No Known Allergies  Current Outpatient Prescriptions on File Prior to Visit  Medication Sig Dispense Refill  . albuterol (PROVENTIL HFA;VENTOLIN HFA) 108 (90 BASE) MCG/ACT inhaler Inhale 2 puffs into the lungs every 6 (six) hours as needed  for wheezing. 1 Inhaler 2  . ALPRAZolam (XANAX) 0.5 MG tablet Take 1 tablet (0.5 mg total) by mouth at bedtime. 30 tablet 0  . aspirin 325 MG tablet Take 1 tablet (325 mg total) by mouth daily.    Marland Kitchen Bioflavonoid Products (BIOFLEX) TABS Take 2 tablets by mouth daily.    Marland Kitchen lisinopril (PRINIVIL,ZESTRIL) 20 MG tablet Take 1 tablet (20 mg total) by mouth daily. 90 tablet 3  . metoprolol (LOPRESSOR) 100 MG tablet Take 1 tablet (100 mg total) by mouth 2 (two) times daily. 60 tablet 5  . Multiple Vitamins-Calcium (ONE-A-DAY WOMENS PO) Take 1 tablet by mouth daily.    . valACYclovir (VALTREX) 500 MG tablet Take 1 tablet by mouth daily.     No current facility-administered medications on file prior to visit.    BP 177/92 mmHg  Pulse 60  Temp(Src) 98.3 F (36.8 C) (Oral)  Ht 5\' 3"  (1.6 m)   Wt 190 lb 3.2 oz (86.274 kg)  BMI 33.70 kg/m2  SpO2 100%  LMP 04/18/2012       Objective:   Physical Exam  General Mental Status- Alert. General Appearance- Not in acute distress.   Skin General: Color- Normal Color. Moisture- Normal Moisture.  Neck Carotid Arteries- Normal color. Moisture- Normal Moisture. No carotid bruits. No JVD.  Chest and Lung Exam Auscultation: Breath Sounds:-Normal.  Cardiovascular Auscultation:Rythm- Regular. Murmurs & Other Heart Sounds:Auscultation of the heart reveals- No Murmurs.  Abdomen Inspection:-Inspeection Normal. Palpation/Percussion:Note:No mass. Palpation and Percussion of the abdomen reveal- Non Tender, Non Distended + BS, no rebound or guarding.    Neurologic Cranial Nerve exam:- CN III-XII intact(No nystagmus), symmetric smile. Drift Test:- No drift. Romberg Exam:- Negative.  Heal to Toe Gait exam:-Normal. Finger to Nose:- Normal/Intact Strength:- 5/5 equal and symmetric strength both upper and lower extremities.     Assessment & Plan:

## 2015-02-15 NOTE — Assessment & Plan Note (Signed)
bp is high despite use of lisinopril and lopressor. Will add amdlodipine 10 mg 1 tab every day.  If ha with neuro sign or symptoms then ED evaluation.  Follow up in 3 wks or as needed.

## 2015-02-15 NOTE — Progress Notes (Signed)
Pre visit review using our clinic review tool, if applicable. No additional management support is needed unless otherwise documented below in the visit note. 

## 2015-02-15 NOTE — Patient Instructions (Addendum)
Essential hypertension bp is high despite use of lisinopril and lopressor. Will add amdlodipine 10 mg 1 tab every day.  If ha with neuro sign or symptoms then ED evaluation.  Follow up in 3 wks or as needed.      Also  has itching vaginal area and mild odor(this one literally last minute add on complaint as I was handing her the avs). Slight water discharge. This is going on for 3-4 days. Treat you for bv pending urine ancillary. Rx of flagyl.  Follow up 3 wks bp check or as needed.  At end 2 minutes mild chest pain. Recommended ekg and maybe ED eval. Pain went away. Pt declined testing. Stating if chest pain  returns will go to ED. She declined my treatment advise. She left office without having any chest pain.

## 2015-02-16 LAB — URINE CULTURE
Colony Count: NO GROWTH
Organism ID, Bacteria: NO GROWTH

## 2015-02-19 ENCOUNTER — Telehealth: Payer: Self-pay | Admitting: Family Medicine

## 2015-02-19 NOTE — Telephone Encounter (Signed)
No growth on urine culture. Awaiting urine ancillary studies.  Patient is aware.     KP

## 2015-02-19 NOTE — Telephone Encounter (Signed)
Caller name:Luzader, Sarahelizabeth Relation to XM:DYJW Call back [phone] Pharmacy:  Reason for call: pt states she received her lab results by email,. But she does not understand them and would like for you to give her a call.

## 2015-02-20 LAB — URINE CYTOLOGY ANCILLARY ONLY
CHLAMYDIA, DNA PROBE: NEGATIVE
Neisseria Gonorrhea: NEGATIVE
TRICH (WINDOWPATH): NEGATIVE

## 2015-02-21 ENCOUNTER — Other Ambulatory Visit: Payer: Self-pay

## 2015-02-21 LAB — URINE CYTOLOGY ANCILLARY ONLY
BACTERIAL VAGINITIS: POSITIVE — AB
Candida vaginitis: POSITIVE — AB

## 2015-02-21 MED ORDER — FLUCONAZOLE 150 MG PO TABS: 150.0000 mg | ORAL_TABLET | Freq: Once | ORAL | Status: DC

## 2015-02-26 ENCOUNTER — Ambulatory Visit
Admission: RE | Admit: 2015-02-26 | Discharge: 2015-02-26 | Disposition: A | Discharge status: Home / Self Care | Payer: Managed Care, Other (non HMO) | Source: Ambulatory Visit | Attending: Obstetrics and Gynecology | Admitting: Obstetrics and Gynecology

## 2015-02-26 DIAGNOSIS — R1900 Intra-abdominal and pelvic swelling, mass and lump, unspecified site: Secondary | ICD-10-CM

## 2015-02-26 MED ORDER — GADOBENATE DIMEGLUMINE 529 MG/ML IV SOLN
18.0000 mL | Freq: Once | INTRAVENOUS | Status: AC | PRN
  Administered 2015-02-26: 18 mL via INTRAVENOUS

## 2015-03-05 ENCOUNTER — Telehealth: Payer: Self-pay | Admitting: *Deleted

## 2015-03-05 ENCOUNTER — Encounter: Payer: Self-pay | Admitting: *Deleted

## 2015-03-05 NOTE — Telephone Encounter (Signed)
Pre-Visit Call completed with patient and chart updated.   Pre-Visit Info documented in Specialty Comments under [employer].    

## 2015-03-06 ENCOUNTER — Ambulatory Visit (INDEPENDENT_AMBULATORY_CARE_PROVIDER_SITE_OTHER): Payer: Managed Care, Other (non HMO) | Admitting: Family Medicine

## 2015-03-06 ENCOUNTER — Encounter: Payer: Self-pay | Admitting: Family Medicine

## 2015-03-06 VITALS — BP 132/88 | HR 72 | Temp 98.7°F | Ht 63.0 in | Wt 191.2 lb

## 2015-03-06 DIAGNOSIS — I10 Essential (primary) hypertension: Secondary | ICD-10-CM

## 2015-03-06 DIAGNOSIS — R609 Edema, unspecified: Secondary | ICD-10-CM

## 2015-03-06 DIAGNOSIS — G4700 Insomnia, unspecified: Secondary | ICD-10-CM | POA: Diagnosis not present

## 2015-03-06 DIAGNOSIS — Z0000 Encounter for general adult medical examination without abnormal findings: Secondary | ICD-10-CM | POA: Diagnosis not present

## 2015-03-06 LAB — CBC WITH DIFFERENTIAL/PLATELET
Basophils Absolute: 0.1 K/uL (ref 0.0–0.1)
Basophils Relative: 0.7 % (ref 0.0–3.0)
Eosinophils Absolute: 0.1 K/uL (ref 0.0–0.7)
Eosinophils Relative: 1.6 % (ref 0.0–5.0)
HCT: 41.6 % (ref 36.0–46.0)
Hemoglobin: 13.8 g/dL (ref 12.0–15.0)
Lymphocytes Relative: 49.2 % — ABNORMAL HIGH (ref 12.0–46.0)
Lymphs Abs: 3.3 K/uL (ref 0.7–4.0)
MCHC: 33.2 g/dL (ref 30.0–36.0)
MCV: 89.9 fl (ref 78.0–100.0)
Monocytes Absolute: 0.4 K/uL (ref 0.1–1.0)
Monocytes Relative: 6.3 % (ref 3.0–12.0)
Neutro Abs: 2.9 K/uL (ref 1.4–7.7)
Neutrophils Relative %: 42.2 % — ABNORMAL LOW (ref 43.0–77.0)
Platelets: 325 K/uL (ref 150.0–400.0)
RBC: 4.63 Mil/uL (ref 3.87–5.11)
RDW: 13.7 % (ref 11.5–15.5)
WBC: 6.8 K/uL (ref 4.0–10.5)

## 2015-03-06 LAB — TSH: TSH: 0.56 u[IU]/mL (ref 0.35–4.50)

## 2015-03-06 LAB — HEPATIC FUNCTION PANEL
ALT: 18 U/L (ref 0–35)
AST: 18 U/L (ref 0–37)
Albumin: 4 g/dL (ref 3.5–5.2)
Alkaline Phosphatase: 66 U/L (ref 39–117)
Bilirubin, Direct: 0.1 mg/dL (ref 0.0–0.3)
Total Bilirubin: 0.6 mg/dL (ref 0.2–1.2)
Total Protein: 7.3 g/dL (ref 6.0–8.3)

## 2015-03-06 LAB — BASIC METABOLIC PANEL WITH GFR
BUN: 8 mg/dL (ref 6–23)
CO2: 32 meq/L (ref 19–32)
Calcium: 9.4 mg/dL (ref 8.4–10.5)
Chloride: 102 meq/L (ref 96–112)
Creatinine, Ser: 0.81 mg/dL (ref 0.40–1.20)
GFR: 99.07 mL/min
Glucose, Bld: 83 mg/dL (ref 70–99)
Potassium: 3.3 meq/L — ABNORMAL LOW (ref 3.5–5.1)
Sodium: 137 meq/L (ref 135–145)

## 2015-03-06 LAB — LIPID PANEL
Cholesterol: 168 mg/dL (ref 0–200)
HDL: 60.1 mg/dL (ref >39.00)
LDL Cholesterol: 92 mg/dL (ref 0–99)
NONHDL: 107.9
Total CHOL/HDL Ratio: 3
Triglycerides: 79 mg/dL (ref 0.0–149.0)
VLDL: 15.8 mg/dL (ref 0.0–40.0)

## 2015-03-06 MED ORDER — SPIRONOLACTONE 25 MG PO TABS: 25.0000 mg | ORAL_TABLET | Freq: Every day | ORAL | Status: DC

## 2015-03-06 MED ORDER — ALPRAZOLAM 0.5 MG PO TABS: 0.5000 mg | ORAL_TABLET | Freq: Every day | ORAL | Status: DC

## 2015-03-06 MED ORDER — METOPROLOL TARTRATE 100 MG PO TABS: 100.0000 mg | ORAL_TABLET | Freq: Two times a day (BID) | ORAL | Status: DC

## 2015-03-06 MED ORDER — LISINOPRIL 20 MG PO TABS: 20.0000 mg | ORAL_TABLET | Freq: Every day | ORAL | Status: DC

## 2015-03-06 NOTE — Progress Notes (Signed)
Pre visit review using our clinic review tool, if applicable. No additional management support is needed unless otherwise documented below in the visit note. 

## 2015-03-06 NOTE — Patient Instructions (Signed)
Preventive Care for Adults A healthy lifestyle and preventive care can promote health and wellness. Preventive health guidelines for women include the following key practices.  A routine yearly physical is a good way to check with your health care provider about your health and preventive screening. It is a chance to share any concerns and updates on your health and to receive a thorough exam.  Visit your dentist for a routine exam and preventive care every 6 months. Brush your teeth twice a day and floss once a day. Good oral hygiene prevents tooth decay and gum disease.  The frequency of eye exams is based on your age, health, family medical history, use of contact lenses, and other factors. Follow your health care provider's recommendations for frequency of eye exams.  Eat a healthy diet. Foods like vegetables, fruits, whole grains, low-fat dairy products, and lean protein foods contain the nutrients you need without too many calories. Decrease your intake of foods high in solid fats, added sugars, and salt. Eat the right amount of calories for you.Get information about a proper diet from your health care provider, if necessary.  Regular physical exercise is one of the most important things you can do for your health. Most adults should get at least 150 minutes of moderate-intensity exercise (any activity that increases your heart rate and causes you to sweat) each week. In addition, most adults need muscle-strengthening exercises on 2 or more days a week.  Maintain a healthy weight. The body mass index (BMI) is a screening tool to identify possible weight problems. It provides an estimate of body fat based on height and weight. Your health care provider can find your BMI and can help you achieve or maintain a healthy weight.For adults 20 years and older:  A BMI below 18.5 is considered underweight.  A BMI of 18.5 to 24.9 is normal.  A BMI of 25 to 29.9 is considered overweight.  A BMI of  30 and above is considered obese.  Maintain normal blood lipids and cholesterol levels by exercising and minimizing your intake of saturated fat. Eat a balanced diet with plenty of fruit and vegetables. Blood tests for lipids and cholesterol should begin at age 76 and be repeated every 5 years. If your lipid or cholesterol levels are high, you are over 50, or you are at high risk for heart disease, you may need your cholesterol levels checked more frequently.Ongoing high lipid and cholesterol levels should be treated with medicines if diet and exercise are not working.  If you smoke, find out from your health care provider how to quit. If you do not use tobacco, do not start.  Lung cancer screening is recommended for adults aged 22-80 years who are at high risk for developing lung cancer because of a history of smoking. A yearly low-dose CT scan of the lungs is recommended for people who have at least a 30-pack-year history of smoking and are a current smoker or have quit within the past 15 years. A pack year of smoking is smoking an average of 1 pack of cigarettes a day for 1 year (for example: 1 pack a day for 30 years or 2 packs a day for 15 years). Yearly screening should continue until the smoker has stopped smoking for at least 15 years. Yearly screening should be stopped for people who develop a health problem that would prevent them from having lung cancer treatment.  If you are pregnant, do not drink alcohol. If you are breastfeeding,  be very cautious about drinking alcohol. If you are not pregnant and choose to drink alcohol, do not have more than 1 drink per day. One drink is considered to be 12 ounces (355 mL) of beer, 5 ounces (148 mL) of wine, or 1.5 ounces (44 mL) of liquor.  Avoid use of street drugs. Do not share needles with anyone. Ask for help if you need support or instructions about stopping the use of drugs.  High blood pressure causes heart disease and increases the risk of  stroke. Your blood pressure should be checked at least every 1 to 2 years. Ongoing high blood pressure should be treated with medicines if weight loss and exercise do not work.  If you are 75-52 years old, ask your health care provider if you should take aspirin to prevent strokes.  Diabetes screening involves taking a blood sample to check your fasting blood sugar level. This should be done once every 3 years, after age 15, if you are within normal weight and without risk factors for diabetes. Testing should be considered at a younger age or be carried out more frequently if you are overweight and have at least 1 risk factor for diabetes.  Breast cancer screening is essential preventive care for women. You should practice "breast self-awareness." This means understanding the normal appearance and feel of your breasts and may include breast self-examination. Any changes detected, no matter how small, should be reported to a health care provider. Women in their 58s and 30s should have a clinical breast exam (CBE) by a health care provider as part of a regular health exam every 1 to 3 years. After age 16, women should have a CBE every year. Starting at age 53, women should consider having a mammogram (breast X-ray test) every year. Women who have a family history of breast cancer should talk to their health care provider about genetic screening. Women at a high risk of breast cancer should talk to their health care providers about having an MRI and a mammogram every year.  Breast cancer gene (BRCA)-related cancer risk assessment is recommended for women who have family members with BRCA-related cancers. BRCA-related cancers include breast, ovarian, tubal, and peritoneal cancers. Having family members with these cancers may be associated with an increased risk for harmful changes (mutations) in the breast cancer genes BRCA1 and BRCA2. Results of the assessment will determine the need for genetic counseling and  BRCA1 and BRCA2 testing.  Routine pelvic exams to screen for cancer are no longer recommended for nonpregnant women who are considered low risk for cancer of the pelvic organs (ovaries, uterus, and vagina) and who do not have symptoms. Ask your health care provider if a screening pelvic exam is right for you.  If you have had past treatment for cervical cancer or a condition that could lead to cancer, you need Pap tests and screening for cancer for at least 20 years after your treatment. If Pap tests have been discontinued, your risk factors (such as having a new sexual partner) need to be reassessed to determine if screening should be resumed. Some women have medical problems that increase the chance of getting cervical cancer. In these cases, your health care provider may recommend more frequent screening and Pap tests.  The HPV test is an additional test that may be used for cervical cancer screening. The HPV test looks for the virus that can cause the cell changes on the cervix. The cells collected during the Pap test can be  tested for HPV. The HPV test could be used to screen women aged 30 years and older, and should be used in women of any age who have unclear Pap test results. After the age of 30, women should have HPV testing at the same frequency as a Pap test.  Colorectal cancer can be detected and often prevented. Most routine colorectal cancer screening begins at the age of 50 years and continues through age 75 years. However, your health care provider may recommend screening at an earlier age if you have risk factors for colon cancer. On a yearly basis, your health care provider may provide home test kits to check for hidden blood in the stool. Use of a small camera at the end of a tube, to directly examine the colon (sigmoidoscopy or colonoscopy), can detect the earliest forms of colorectal cancer. Talk to your health care provider about this at age 50, when routine screening begins. Direct  exam of the colon should be repeated every 5-10 years through age 75 years, unless early forms of pre-cancerous polyps or small growths are found.  People who are at an increased risk for hepatitis B should be screened for this virus. You are considered at high risk for hepatitis B if:  You were born in a country where hepatitis B occurs often. Talk with your health care provider about which countries are considered high risk.  Your parents were born in a high-risk country and you have not received a shot to protect against hepatitis B (hepatitis B vaccine).  You have HIV or AIDS.  You use needles to inject street drugs.  You live with, or have sex with, someone who has hepatitis B.  You get hemodialysis treatment.  You take certain medicines for conditions like cancer, organ transplantation, and autoimmune conditions.  Hepatitis C blood testing is recommended for all people born from 1945 through 1965 and any individual with known risks for hepatitis C.  Practice safe sex. Use condoms and avoid high-risk sexual practices to reduce the spread of sexually transmitted infections (STIs). STIs include gonorrhea, chlamydia, syphilis, trichomonas, herpes, HPV, and human immunodeficiency virus (HIV). Herpes, HIV, and HPV are viral illnesses that have no cure. They can result in disability, cancer, and death.  You should be screened for sexually transmitted illnesses (STIs) including gonorrhea and chlamydia if:  You are sexually active and are younger than 24 years.  You are older than 24 years and your health care provider tells you that you are at risk for this type of infection.  Your sexual activity has changed since you were last screened and you are at an increased risk for chlamydia or gonorrhea. Ask your health care provider if you are at risk.  If you are at risk of being infected with HIV, it is recommended that you take a prescription medicine daily to prevent HIV infection. This is  called preexposure prophylaxis (PrEP). You are considered at risk if:  You are a heterosexual woman, are sexually active, and are at increased risk for HIV infection.  You take drugs by injection.  You are sexually active with a partner who has HIV.  Talk with your health care provider about whether you are at high risk of being infected with HIV. If you choose to begin PrEP, you should first be tested for HIV. You should then be tested every 3 months for as long as you are taking PrEP.  Osteoporosis is a disease in which the bones lose minerals and strength   with aging. This can result in serious bone fractures or breaks. The risk of osteoporosis can be identified using a bone density scan. Women ages 65 years and over and women at risk for fractures or osteoporosis should discuss screening with their health care providers. Ask your health care provider whether you should take a calcium supplement or vitamin D to reduce the rate of osteoporosis.  Menopause can be associated with physical symptoms and risks. Hormone replacement therapy is available to decrease symptoms and risks. You should talk to your health care provider about whether hormone replacement therapy is right for you.  Use sunscreen. Apply sunscreen liberally and repeatedly throughout the day. You should seek shade when your shadow is shorter than you. Protect yourself by wearing long sleeves, pants, a wide-brimmed hat, and sunglasses year round, whenever you are outdoors.  Once a month, do a whole body skin exam, using a mirror to look at the skin on your back. Tell your health care provider of new moles, moles that have irregular borders, moles that are larger than a pencil eraser, or moles that have changed in shape or color.  Stay current with required vaccines (immunizations).  Influenza vaccine. All adults should be immunized every year.  Tetanus, diphtheria, and acellular pertussis (Td, Tdap) vaccine. Pregnant women should  receive 1 dose of Tdap vaccine during each pregnancy. The dose should be obtained regardless of the length of time since the last dose. Immunization is preferred during the 27th-36th week of gestation. An adult who has not previously received Tdap or who does not know her vaccine status should receive 1 dose of Tdap. This initial dose should be followed by tetanus and diphtheria toxoids (Td) booster doses every 10 years. Adults with an unknown or incomplete history of completing a 3-dose immunization series with Td-containing vaccines should begin or complete a primary immunization series including a Tdap dose. Adults should receive a Td booster every 10 years.  Varicella vaccine. An adult without evidence of immunity to varicella should receive 2 doses or a second dose if she has previously received 1 dose. Pregnant females who do not have evidence of immunity should receive the first dose after pregnancy. This first dose should be obtained before leaving the health care facility. The second dose should be obtained 4-8 weeks after the first dose.  Human papillomavirus (HPV) vaccine. Females aged 13-26 years who have not received the vaccine previously should obtain the 3-dose series. The vaccine is not recommended for use in pregnant females. However, pregnancy testing is not needed before receiving a dose. If a female is found to be pregnant after receiving a dose, no treatment is needed. In that case, the remaining doses should be delayed until after the pregnancy. Immunization is recommended for any person with an immunocompromised condition through the age of 26 years if she did not get any or all doses earlier. During the 3-dose series, the second dose should be obtained 4-8 weeks after the first dose. The third dose should be obtained 24 weeks after the first dose and 16 weeks after the second dose.  Zoster vaccine. One dose is recommended for adults aged 60 years or older unless certain conditions are  present.  Measles, mumps, and rubella (MMR) vaccine. Adults born before 1957 generally are considered immune to measles and mumps. Adults born in 1957 or later should have 1 or more doses of MMR vaccine unless there is a contraindication to the vaccine or there is laboratory evidence of immunity to   each of the three diseases. A routine second dose of MMR vaccine should be obtained at least 28 days after the first dose for students attending postsecondary schools, health care workers, or international travelers. People who received inactivated measles vaccine or an unknown type of measles vaccine during 1963-1967 should receive 2 doses of MMR vaccine. People who received inactivated mumps vaccine or an unknown type of mumps vaccine before 1979 and are at high risk for mumps infection should consider immunization with 2 doses of MMR vaccine. For females of childbearing age, rubella immunity should be determined. If there is no evidence of immunity, females who are not pregnant should be vaccinated. If there is no evidence of immunity, females who are pregnant should delay immunization until after pregnancy. Unvaccinated health care workers born before 1957 who lack laboratory evidence of measles, mumps, or rubella immunity or laboratory confirmation of disease should consider measles and mumps immunization with 2 doses of MMR vaccine or rubella immunization with 1 dose of MMR vaccine.  Pneumococcal 13-valent conjugate (PCV13) vaccine. When indicated, a person who is uncertain of her immunization history and has no record of immunization should receive the PCV13 vaccine. An adult aged 19 years or older who has certain medical conditions and has not been previously immunized should receive 1 dose of PCV13 vaccine. This PCV13 should be followed with a dose of pneumococcal polysaccharide (PPSV23) vaccine. The PPSV23 vaccine dose should be obtained at least 8 weeks after the dose of PCV13 vaccine. An adult aged 19  years or older who has certain medical conditions and previously received 1 or more doses of PPSV23 vaccine should receive 1 dose of PCV13. The PCV13 vaccine dose should be obtained 1 or more years after the last PPSV23 vaccine dose.  Pneumococcal polysaccharide (PPSV23) vaccine. When PCV13 is also indicated, PCV13 should be obtained first. All adults aged 65 years and older should be immunized. An adult younger than age 65 years who has certain medical conditions should be immunized. Any person who resides in a nursing home or long-term care facility should be immunized. An adult smoker should be immunized. People with an immunocompromised condition and certain other conditions should receive both PCV13 and PPSV23 vaccines. People with human immunodeficiency virus (HIV) infection should be immunized as soon as possible after diagnosis. Immunization during chemotherapy or radiation therapy should be avoided. Routine use of PPSV23 vaccine is not recommended for American Indians, [place] Natives, or people younger than 65 years unless there are medical conditions that require PPSV23 vaccine. When indicated, people who have unknown immunization and have no record of immunization should receive PPSV23 vaccine. One-time revaccination 5 years after the first dose of PPSV23 is recommended for people aged 19-64 years who have chronic kidney failure, nephrotic syndrome, asplenia, or immunocompromised conditions. People who received 1-2 doses of PPSV23 before age 65 years should receive another dose of PPSV23 vaccine at age 65 years or later if at least 5 years have passed since the previous dose. Doses of PPSV23 are not needed for people immunized with PPSV23 at or after age 65 years.  Meningococcal vaccine. Adults with asplenia or persistent complement component deficiencies should receive 2 doses of quadrivalent meningococcal conjugate (MenACWY-D) vaccine. The doses should be obtained at least 2 months apart.  Microbiologists working with certain meningococcal bacteria, military recruits, people at risk during an outbreak, and people who travel to or live in countries with a high rate of meningitis should be immunized. A first-year college student up through age   21 years who is living in a residence hall should receive a dose if she did not receive a dose on or after her 16th birthday. Adults who have certain high-risk conditions should receive one or more doses of vaccine.  Hepatitis A vaccine. Adults who wish to be protected from this disease, have certain high-risk conditions, work with hepatitis A-infected animals, work in hepatitis A research labs, or travel to or work in countries with a high rate of hepatitis A should be immunized. Adults who were previously unvaccinated and who anticipate close contact with an international adoptee during the first 60 days after arrival in the [place] from a country with a high rate of hepatitis A should be immunized.  Hepatitis B vaccine. Adults who wish to be protected from this disease, have certain high-risk conditions, may be exposed to blood or other infectious body fluids, are household contacts or sex partners of hepatitis B positive people, are clients or workers in certain care facilities, or travel to or work in countries with a high rate of hepatitis B should be immunized.  Haemophilus influenzae type b (Hib) vaccine. A previously unvaccinated person with asplenia or sickle cell disease or having a scheduled splenectomy should receive 1 dose of Hib vaccine. Regardless of previous immunization, a recipient of a hematopoietic stem cell transplant should receive a 3-dose series 6-12 months after her successful transplant. Hib vaccine is not recommended for adults with HIV infection. Preventive Services / Frequency Ages 64 to 68 years  Blood pressure check.** / Every 1 to 2 years.  Lipid and cholesterol check.** / Every 5 years beginning at age  22.  Clinical breast exam.** / Every 3 years for women in their 88s and 53s.  BRCA-related cancer risk assessment.** / For women who have family members with a BRCA-related cancer (breast, ovarian, tubal, or peritoneal cancers).  Pap test.** / Every 2 years from ages 90 through 51. Every 3 years starting at age 21 through age 56 or 3 with a history of 3 consecutive normal Pap tests.  HPV screening.** / Every 3 years from ages 24 through ages 1 to 46 with a history of 3 consecutive normal Pap tests.  Hepatitis C blood test.** / For any individual with known risks for hepatitis C.  Skin self-exam. / Monthly.  Influenza vaccine. / Every year.  Tetanus, diphtheria, and acellular pertussis (Tdap, Td) vaccine.** / Consult your health care provider. Pregnant women should receive 1 dose of Tdap vaccine during each pregnancy. 1 dose of Td every 10 years.  Varicella vaccine.** / Consult your health care provider. Pregnant females who do not have evidence of immunity should receive the first dose after pregnancy.  HPV vaccine. / 3 doses over 6 months, if 72 and younger. The vaccine is not recommended for use in pregnant females. However, pregnancy testing is not needed before receiving a dose.  Measles, mumps, rubella (MMR) vaccine.** / You need at least 1 dose of MMR if you were born in 1957 or later. You may also need a 2nd dose. For females of childbearing age, rubella immunity should be determined. If there is no evidence of immunity, females who are not pregnant should be vaccinated. If there is no evidence of immunity, females who are pregnant should delay immunization until after pregnancy.  Pneumococcal 13-valent conjugate (PCV13) vaccine.** / Consult your health care provider.  Pneumococcal polysaccharide (PPSV23) vaccine.** / 1 to 2 doses if you smoke cigarettes or if you have certain conditions.  Meningococcal vaccine.** /  1 dose if you are age 19 to 21 years and a first-year college  student living in a residence hall, or have one of several medical conditions, you need to get vaccinated against meningococcal disease. You may also need additional booster doses.  Hepatitis A vaccine.** / Consult your health care provider.  Hepatitis B vaccine.** / Consult your health care provider.  Haemophilus influenzae type b (Hib) vaccine.** / Consult your health care provider. Ages 40 to 64 years  Blood pressure check.** / Every 1 to 2 years.  Lipid and cholesterol check.** / Every 5 years beginning at age 20 years.  Lung cancer screening. / Every year if you are aged 55-80 years and have a 30-pack-year history of smoking and currently smoke or have quit within the past 15 years. Yearly screening is stopped once you have quit smoking for at least 15 years or develop a health problem that would prevent you from having lung cancer treatment.  Clinical breast exam.** / Every year after age 40 years.  BRCA-related cancer risk assessment.** / For women who have family members with a BRCA-related cancer (breast, ovarian, tubal, or peritoneal cancers).  Mammogram.** / Every year beginning at age 40 years and continuing for as long as you are in good health. Consult with your health care provider.  Pap test.** / Every 3 years starting at age 30 years through age 65 or 70 years with a history of 3 consecutive normal Pap tests.  HPV screening.** / Every 3 years from ages 30 years through ages 65 to 70 years with a history of 3 consecutive normal Pap tests.  Fecal occult blood test (FOBT) of stool. / Every year beginning at age 50 years and continuing until age 75 years. You may not need to do this test if you get a colonoscopy every 10 years.  Flexible sigmoidoscopy or colonoscopy.** / Every 5 years for a flexible sigmoidoscopy or every 10 years for a colonoscopy beginning at age 50 years and continuing until age 75 years.  Hepatitis C blood test.** / For all people born from 1945 through  1965 and any individual with known risks for hepatitis C.  Skin self-exam. / Monthly.  Influenza vaccine. / Every year.  Tetanus, diphtheria, and acellular pertussis (Tdap/Td) vaccine.** / Consult your health care provider. Pregnant women should receive 1 dose of Tdap vaccine during each pregnancy. 1 dose of Td every 10 years.  Varicella vaccine.** / Consult your health care provider. Pregnant females who do not have evidence of immunity should receive the first dose after pregnancy.  Zoster vaccine.** / 1 dose for adults aged 60 years or older.  Measles, mumps, rubella (MMR) vaccine.** / You need at least 1 dose of MMR if you were born in 1957 or later. You may also need a 2nd dose. For females of childbearing age, rubella immunity should be determined. If there is no evidence of immunity, females who are not pregnant should be vaccinated. If there is no evidence of immunity, females who are pregnant should delay immunization until after pregnancy.  Pneumococcal 13-valent conjugate (PCV13) vaccine.** / Consult your health care provider.  Pneumococcal polysaccharide (PPSV23) vaccine.** / 1 to 2 doses if you smoke cigarettes or if you have certain conditions.  Meningococcal vaccine.** / Consult your health care provider.  Hepatitis A vaccine.** / Consult your health care provider.  Hepatitis B vaccine.** / Consult your health care provider.  Haemophilus influenzae type b (Hib) vaccine.** / Consult your health care provider. Ages 65   years and over  Blood pressure check.** / Every 1 to 2 years.  Lipid and cholesterol check.** / Every 5 years beginning at age 22 years.  Lung cancer screening. / Every year if you are aged 73-80 years and have a 30-pack-year history of smoking and currently smoke or have quit within the past 15 years. Yearly screening is stopped once you have quit smoking for at least 15 years or develop a health problem that would prevent you from having lung cancer  treatment.  Clinical breast exam.** / Every year after age 4 years.  BRCA-related cancer risk assessment.** / For women who have family members with a BRCA-related cancer (breast, ovarian, tubal, or peritoneal cancers).  Mammogram.** / Every year beginning at age 40 years and continuing for as long as you are in good health. Consult with your health care provider.  Pap test.** / Every 3 years starting at age 9 years through age 34 or 91 years with 3 consecutive normal Pap tests. Testing can be stopped between 65 and 70 years with 3 consecutive normal Pap tests and no abnormal Pap or HPV tests in the past 10 years.  HPV screening.** / Every 3 years from ages 57 years through ages 64 or 45 years with a history of 3 consecutive normal Pap tests. Testing can be stopped between 65 and 70 years with 3 consecutive normal Pap tests and no abnormal Pap or HPV tests in the past 10 years.  Fecal occult blood test (FOBT) of stool. / Every year beginning at age 15 years and continuing until age 17 years. You may not need to do this test if you get a colonoscopy every 10 years.  Flexible sigmoidoscopy or colonoscopy.** / Every 5 years for a flexible sigmoidoscopy or every 10 years for a colonoscopy beginning at age 86 years and continuing until age 71 years.  Hepatitis C blood test.** / For all people born from [birth year] through 1965 and any individual with known risks for hepatitis C.  Osteoporosis screening.** / A one-time screening for women ages 83 years and over and women at risk for fractures or osteoporosis.  Skin self-exam. / Monthly.  Influenza vaccine. / Every year.  Tetanus, diphtheria, and acellular pertussis (Tdap/Td) vaccine.** / 1 dose of Td every 10 years.  Varicella vaccine.** / Consult your health care provider.  Zoster vaccine.** / 1 dose for adults aged 61 years or older.  Pneumococcal 13-valent conjugate (PCV13) vaccine.** / Consult your health care provider.  Pneumococcal  polysaccharide (PPSV23) vaccine.** / 1 dose for all adults aged 28 years and older.  Meningococcal vaccine.** / Consult your health care provider.  Hepatitis A vaccine.** / Consult your health care provider.  Hepatitis B vaccine.** / Consult your health care provider.  Haemophilus influenzae type b (Hib) vaccine.** / Consult your health care provider. ** Family history and personal history of risk and conditions may change your health care provider's recommendations. Document Released: 01/06/2002 Document Revised: 03/27/2014 Document Reviewed: 04/07/2011 [facility] Patient Information 2015 [place], [place]. This information is not intended to replace advice given to you by your health care provider. Make sure you discuss any questions you have with your health care provider.

## 2015-03-06 NOTE — Progress Notes (Signed)
Subjective:     [name] is a 44 y.o. female and is here for a comprehensive physical exam. The patient reports no problems.--- pt also needs refills and is requesting hiv and rpr  History   Social History  . Marital Status: Single    Spouse Name: N/A  . Number of Children: N/A  . Years of Education: N/A   Occupational History  . Not on file.   Social History Main Topics  . Smoking status: Never Smoker   . Smokeless tobacco: Never Used  . Alcohol Use: No  . Drug Use: No  . Sexual Activity:    Partners: Male    Birth Control/ Protection: Surgical     Comment: hyst   Other Topics Concern  . Not on file   Social History Narrative   Exercise---walk, zumba, [employer] video   Health Maintenance  Topic Date Due  . INFLUENZA VACCINE  06/25/2015  . MAMMOGRAM  10/20/2015  . PAP SMEAR  05/24/2016  . TETANUS/TDAP  01/04/2018  . HIV Screening  Completed    The following portions of the patient's history were reviewed and updated as appropriate:  She  has a past medical history of Allergy; Asthma; Hypertension; Herpes genitalia; H/O: myomectomy; GERD (gastroesophageal reflux disease); Heart murmur; TIA (transient ischemic attack); Refusal of blood transfusions as patient is Jehovah's Witness; and Hearing impaired. She  does not have any pertinent problems on file. She  has past surgical history that includes Myomectomy (2004  ); Ovarian cyst removal (2010); Breast lumpectomy (01/06/11); Robotic assisted lap vaginal hysterectomy; and Cystoscopy (05/19/2012). Her family history includes Arthritis in her mother; Asthma in her mother; Cancer (age of onset: 78) in her other; Cancer (age of onset: 62) in her maternal grandmother; Diabetes in her brother; Heart disease in her maternal grandmother; Hypertension in her father; Migraines in her sister. She  reports that she has never smoked. She has never used smokeless tobacco. She reports that she does not drink alcohol or use  illicit drugs. She has a current medication list which includes the following prescription(s): albuterol, alprazolam, vitamin c, aspirin, bioflex, lisinopril, metoprolol, multiple vitamins-calcium, and valacyclovir. Current Outpatient Prescriptions on File Prior to Visit  Medication Sig Dispense Refill  . albuterol (PROVENTIL HFA;VENTOLIN HFA) 108 (90 BASE) MCG/ACT inhaler Inhale 2 puffs into the lungs every 6 (six) hours as needed for wheezing. 1 Inhaler 2  . ALPRAZolam (XANAX) 0.5 MG tablet Take 1 tablet (0.5 mg total) by mouth at bedtime. 30 tablet 0  . Ascorbic Acid (VITAMIN C) 1000 MG tablet Take 1,000 mg by mouth daily.    Marland Kitchen aspirin 325 MG tablet Take 1 tablet (325 mg total) by mouth daily.    Marland Kitchen Bioflavonoid Products (BIOFLEX) TABS Take 2 tablets by mouth daily.    Marland Kitchen lisinopril (PRINIVIL,ZESTRIL) 20 MG tablet Take 1 tablet (20 mg total) by mouth daily. 90 tablet 3  . metoprolol (LOPRESSOR) 100 MG tablet Take 1 tablet (100 mg total) by mouth 2 (two) times daily. 60 tablet 5  . Multiple Vitamins-Calcium (ONE-A-DAY WOMENS PO) Take 1 tablet by mouth daily.    . valACYclovir (VALTREX) 500 MG tablet Take 1 tablet by mouth daily.     No current facility-administered medications on file prior to visit.   She has No Known Allergies..  Review of Systems  Review of Systems  Constitutional: Negative for activity change, appetite change and fatigue.  HENT: Negative for hearing loss, congestion, tinnitus and ear discharge.  dentist  q47m Eyes: Negative for visual disturbance (see optho q1y -- vision corrected to 20/20 with glasses).  Respiratory: Negative for cough, chest tightness and shortness of breath.   Cardiovascular: Negative for chest pain, palpitations and leg swelling.  Gastrointestinal: Negative for abdominal pain, diarrhea, constipation and abdominal distention.  Genitourinary: Negative for urgency, frequency, decreased urine volume and difficulty urinating.  Musculoskeletal: Negative  for back pain, arthralgias and gait problem.  Skin: Negative for color change, pallor and rash.  Neurological: Negative for dizziness, light-headedness, numbness and headaches.  Hematological: Negative for adenopathy. Does not bruise/bleed easily.  Psychiatric/Behavioral: Negative for suicidal ideas, confusion, sleep disturbance, self-injury, dysphoric mood, decreased concentration and agitation.      Objective:    BP 132/88 mmHg  Pulse 72  Temp(Src) 98.7 F (37.1 C) (Oral)  Ht 5\' 3"  (1.6 m)  Wt 191 lb 3.2 oz (86.728 kg)  BMI 33.88 kg/m2  SpO2 98%  LMP 04/18/2012 General appearance: alert, cooperative, appears stated age and no distress Head: Normocephalic, without obvious abnormality, atraumatic Eyes: conjunctivae/corneas clear. PERRL, EOM's intact. Fundi benign. Ears: normal TM's and external ear canals both ears Nose: Nares normal. Septum midline. Mucosa normal. No drainage or sinus tenderness. Throat: lips, mucosa, and tongue normal; teeth and gums normal Neck: no adenopathy, no carotid bruit, no JVD, supple, symmetrical, trachea midline and thyroid not enlarged, symmetric, no tenderness/mass/nodules Back: symmetric, no curvature. ROM normal. No CVA tenderness. Lungs: clear to auscultation bilaterally Breasts: deferred Heart: regular rate and rhythm, S1, S2 normal, no murmur, click, rub or gallop Abdomen: soft, non-tender; bowel sounds normal; no masses,  no organomegaly Pelvic: deferred--gyn Extremities: extremities normal, atraumatic, no cyanosis or edema Pulses: 2+ and symmetric Skin: Skin color, texture, turgor normal. No rashes or lesions Lymph nodes: Cervical, supraclavicular, and axillary nodes normal. Neurologic: Alert and oriented X 3, normal strength and tone. Normal symmetric reflexes. Normal coordination and gait Psych-- no depression, no anxiety      Assessment:    Healthy female exam.      Plan:    ghm utd  Check labs See After Visit Summary for  Counseling Recommendations   1. Essential hypertension Slightly elevated today-- add diuretic - lisinopril (PRINIVIL,ZESTRIL) 20 MG tablet; Take 1 tablet (20 mg total) by mouth daily.  Dispense: 90 tablet; Refill: 3 - metoprolol (LOPRESSOR) 100 MG tablet; Take 1 tablet (100 mg total) by mouth 2 (two) times daily.  Dispense: 180 tablet; Refill: 3 - Basic metabolic panel - CBC with Differential/Platelet - spironolactone (ALDACTONE) 25 MG tablet; Take 1 tablet (25 mg total) by mouth daily.  Dispense: 90 tablet; Refill: 3  2. Insomnia   - ALPRAZolam (XANAX) 0.5 MG tablet; Take 1 tablet (0.5 mg total) by mouth at bedtime.  Dispense: 30 tablet; Refill: 0  3. Preventative health care   - Basic metabolic panel - CBC with Differential/Platelet - Hepatic function panel - Lipid panel - POCT urinalysis dipstick - TSH - HIV antibody - RPR  4. Edema   - spironolactone (ALDACTONE) 25 MG tablet; Take 1 tablet (25 mg total) by mouth daily.  Dispense: 90 tablet; Refill: 3

## 2015-03-07 ENCOUNTER — Other Ambulatory Visit: Payer: Self-pay | Admitting: Obstetrics and Gynecology

## 2015-03-07 LAB — RPR

## 2015-03-07 LAB — HIV ANTIBODY (ROUTINE TESTING W REFLEX): HIV 1&2 Ab, 4th Generation: NONREACTIVE

## 2015-03-13 ENCOUNTER — Other Ambulatory Visit: Payer: Self-pay

## 2015-03-13 ENCOUNTER — Encounter (HOSPITAL_COMMUNITY): Payer: Self-pay

## 2015-03-13 ENCOUNTER — Encounter (HOSPITAL_COMMUNITY)
Admission: RE | Admit: 2015-03-13 | Discharge: 2015-03-13 | Disposition: A | Discharge status: Home / Self Care | Payer: Managed Care, Other (non HMO) | Source: Ambulatory Visit | Attending: Obstetrics and Gynecology | Admitting: Obstetrics and Gynecology

## 2015-03-13 DIAGNOSIS — Z01818 Encounter for other preprocedural examination: Secondary | ICD-10-CM | POA: Insufficient documentation

## 2015-03-13 LAB — BASIC METABOLIC PANEL
Anion gap: 8 (ref 5–15)
BUN: 10 mg/dL (ref 6–23)
CO2: 27 mmol/L (ref 19–32)
CREATININE: 0.84 mg/dL (ref 0.50–1.10)
Calcium: 9.3 mg/dL (ref 8.4–10.5)
Chloride: 102 mmol/L (ref 96–112)
GFR calc Af Amer: >90 mL/min (ref >90)
GFR, EST NON AFRICAN AMERICAN: 84 mL/min — AB (ref >90)
GLUCOSE: 84 mg/dL (ref 70–99)
Potassium: 3.6 mmol/L (ref 3.5–5.1)
SODIUM: 137 mmol/L (ref 135–145)

## 2015-03-13 LAB — CBC
HCT: 39 % (ref 36.0–46.0)
Hemoglobin: 13.4 g/dL (ref 12.0–15.0)
MCH: 30.5 pg (ref 26.0–34.0)
MCHC: 34.4 g/dL (ref 30.0–36.0)
MCV: 88.6 fL (ref 78.0–100.0)
PLATELETS: 351 10*3/uL (ref 150–400)
RBC: 4.4 MIL/uL (ref 3.87–5.11)
RDW: 13.1 % (ref 11.5–15.5)
WBC: 8.7 10*3/uL (ref 4.0–10.5)

## 2015-03-13 NOTE — Patient Instructions (Addendum)
   Your procedure is scheduled on: April 26 at [place] through [facility] at: 1130am  Pick up the phone at the desk and dial [phone] and inform us of your arrival.  Please call this number if you have any problems the morning of surgery: [phone]  Remember: Do not eat food after midnight: April 25 Do not drink clear liquids after: 9am Day of Surgery Take these medicines the morning of surgery with a SIP OF WATER: Take metoprolol and lisinopril day of surgery.. Do not take ASA until after surgery  Do not wear jewelry, make-up, or FINGER nail polish No metal in your hair or on your body. Do not wear lotions, powders, perfumes.  You may wear deodorant.  Do not bring valuables to the hospital. Contacts, dentures or bridgework may not be worn into surgery.  Leave suitcase in the car. After Surgery it may be brought to your room. For patients being admitted to the hospital, checkout time is 11:00am the day of discharge.    Patients discharged on the day of surgery will not be allowed to drive home.

## 2015-03-14 ENCOUNTER — Other Ambulatory Visit (HOSPITAL_COMMUNITY): Payer: Self-pay | Admitting: Obstetrics and Gynecology

## 2015-03-14 NOTE — H&P (Signed)
[name] is a 44 y.o.  female for P 1-0-0-1  who presents for a  Robot Assisted Laparoscopic Salpingectomy and possible ovarian cystectomy because of large pelvic masses and pelvic pain.  In February of 2016 the patient was seen by her PCP for sharp left lower quadrant pelvic pain that was described as "steady"  for  2 weeks. She was found to have  a  normal CMET, CBC,  and negative urine culture .  She admitted to some relief with Ibuprofen though her symptoms were made worse when her bladder was full, she needed to void and with  coughing, sneezing, jarring or walking.  As time progressed the pain,  rated as 6/10 on a 10 point pain scale became more intermittent instead of constant.  She denies any changes in bowel function or dyspareunia.   A pelvic ultrasound, in March 2016 showed: S/P Supra-cervical Hysterectomy; Cul-de-sac mass = 7.8 cm with enlarged right ovary: 7.81 x 7.86 x 5.04 cm; ? septated and ? part of cul-de-sac mass, solid component - 4.4 cm and a normal left ovary.  A follow up Pelvic MRI revealed cystic appearing centrally located structure: 6.8 x 7.2 x 5.0 cm  possible hydrosalpinx  vs ovarian neoplasm and left adnexa with cystically appearing  4.8 x 3.2 x 3.0 cm.  The patient's CA-125 and CEA were normal.  Given the radiographic findings and persistence of the patient's symptoms she has decided to proceed with surgical management.   Past Medical History  OB History: G:1; P: 1-0-0-1;  SVB 1997   GYN History: menarche: 44 YO     LMP: 2013 S/P Hysterectomy   The patient reports a past history of: herpes.  Remote history of CIN-1  Last PAP smear: 05/09/2013-normal  Medical History: Transient Ischemic Attack (2013), Asthma, Hypertension, Renal Stones, Depression, Congenital Left Ear Deafness, Migraine and  Left Breast Ductal Papilloma  Surgical History: 2013  Robot Assisted Laparoscopic Supra-cervical Hysterectomy with Left Salpingectomy; 2012  Left Breast Needle Biopsy-benign,  2010  Laparoscopic Right Ovarian Cystectomies  (#3),   2010 Robot Assisted Myomectomy with Lysis of Adhesions,  2005 Left Breast Biopsy (benign),    2004 Abdominal Myomectomy,  2003 Diagnostic Laparoscopy with Ovarian Cystectomy (clinical findings of endometriosis though not confirmed by pathology report),  Denies problems with anesthesia; Jehovah's Witness-does not receive blood products   Family History: Heart Disease, Hypertension, Thyroid Disease, Lung Cancer, Breast Cancer and Substance Abuse  Social History: Single and employed with [employer]; Denies tobacco or alcohol use   Medication:  Lisinopril 20 mg daily Metoprolol Tartate 100 mg daily Multivitamin daily Spironolactone 25 mg daily Valacyclovir 500 mg  daily Aspirin 325 mg daily  No Known Allergies   Denies sensitivity to peanuts, shellfish, soy, latex or adhesives.   ROS:  Admits to eft ear hearing loss and  tinnitus but denies headache, vision changes, nasal congestion, dysphagia, tinnitus, dizziness, hoarseness, cough,  chest pain, shortness of breath, nausea, vomiting, diarrhea,constipation,  urinary frequency, urgency  dysuria, hematuria, vaginitis symptoms, pelvic pain, swelling of joints,easy bruising,  myalgias, arthralgias, skin rashes, unexplained weight loss and except as is mentioned in the history of present illness, patient's review of systems is otherwise negative.   Physical Exam  Bp: 110/64   P: 80    R: 20    Temperature: 98.4 degrees F orally  Weight: 190 lbs.  Height: 5'3"  BMI: 33.7  Neck: supple without masses or thyromegaly Lungs: clear to auscultation Heart: regular rate  and rhythm Abdomen: soft, tender to deep palpation and voluntary rebound but  no organomegaly Pelvic:EGBUS- wnl; vagina-normal rugae; uterus-surgically absent  cervix without lesions or motion tenderness; adnexae-bilateral tenderness  Extremities:  no clubbing, cyanosis or edema   Assesment: Pelvic Masses            Pelvic Pain  Disposition: The patient was given the indication for her procedure(s) along with the risks, which include but are not limited to, reaction to anesthesia, damage to adjacent organs, infection, excessive bleeding, formation of scar tissue,   and the possible need for an open abdominal incision. She was further advised that she will experience transient post operative facial edema and  that the robotic approach to her surgery requires more time to perform than an open abdominal approach.  Patient was given the Miralax bowel prep to be completed 24 hours prior to procedure.  She verbalized understanding of these risks and pre-operative instructions and has consented to proceed with a Robot Assisted Laparoscopic Salpingectomy with Possible Ovarian Cystectomy at [place] on March 20, 2015 at 1 p.m.  CSN# [id number]   Alajah Witman J. Florene Glen, PA-C  for Dr. Dede Query. [place]

## 2015-03-19 NOTE — Anesthesia Preprocedure Evaluation (Addendum)
Anesthesia Evaluation  Patient identified by MRN, date of birth, ID band Patient awake    Reviewed: Allergy & Precautions, NPO status , Patient's Chart, lab work & pertinent test results, reviewed documented beta blocker date and time   Airway Mallampati: II   Neck ROM: Full    Dental  (+) Dental Advisory Given, Teeth Intact   Pulmonary asthma (inhalers) ,  breath sounds clear to auscultation        Cardiovascular hypertension, Pt. on medications Rhythm:Regular  ECHO 2013 EF 60%, EKG 02/2015 WNL   Neuro/Psych  Headaches, Carotids 2013 WNL TIA   GI/Hepatic Neg liver ROS, GERD-  Medicated,  Endo/Other    Renal/GU      Musculoskeletal   Abdominal (+)  Abdomen: soft.    Peds  Hematology   Anesthesia Other Findings   Reproductive/Obstetrics                           Anesthesia Physical Anesthesia Plan  ASA: II  Anesthesia Plan: General   Post-op Pain Management:    Induction: Intravenous  Airway Management Planned: Oral ETT  Additional Equipment:   Intra-op Plan:   Post-operative Plan: Extubation in OR  Informed Consent: I have reviewed the patients History and Physical, chart, labs and discussed the procedure including the risks, benefits and alternatives for the proposed anesthesia with the patient or authorized representative who has indicated his/her understanding and acceptance.     Plan Discussed with:   Anesthesia Plan Comments:         Anesthesia Quick Evaluation

## 2015-03-20 ENCOUNTER — Ambulatory Visit (HOSPITAL_COMMUNITY): Payer: Managed Care, Other (non HMO) | Admitting: Anesthesiology

## 2015-03-20 ENCOUNTER — Encounter (HOSPITAL_COMMUNITY)
Admission: RE | Disposition: A | Discharge status: Home / Self Care | Payer: Self-pay | Source: Ambulatory Visit | Attending: Obstetrics and Gynecology

## 2015-03-20 ENCOUNTER — Ambulatory Visit (HOSPITAL_COMMUNITY)
Admission: RE | Admit: 2015-03-20 | Discharge: 2015-03-20 | Disposition: A | Discharge status: Home / Self Care | Payer: Managed Care, Other (non HMO) | Source: Ambulatory Visit | Attending: Obstetrics and Gynecology | Admitting: Obstetrics and Gynecology

## 2015-03-20 DIAGNOSIS — I10 Essential (primary) hypertension: Secondary | ICD-10-CM | POA: Diagnosis not present

## 2015-03-20 DIAGNOSIS — Z7982 Long term (current) use of aspirin: Secondary | ICD-10-CM | POA: Insufficient documentation

## 2015-03-20 DIAGNOSIS — Z90710 Acquired absence of both cervix and uterus: Secondary | ICD-10-CM | POA: Diagnosis not present

## 2015-03-20 DIAGNOSIS — Z9889 Other specified postprocedural states: Secondary | ICD-10-CM | POA: Insufficient documentation

## 2015-03-20 DIAGNOSIS — K668 Other specified disorders of peritoneum: Secondary | ICD-10-CM | POA: Diagnosis not present

## 2015-03-20 DIAGNOSIS — N736 Female pelvic peritoneal adhesions (postinfective): Secondary | ICD-10-CM | POA: Diagnosis not present

## 2015-03-20 DIAGNOSIS — H9042 Sensorineural hearing loss, unilateral, left ear, with unrestricted hearing on the contralateral side: Secondary | ICD-10-CM | POA: Diagnosis not present

## 2015-03-20 DIAGNOSIS — Z79899 Other long term (current) drug therapy: Secondary | ICD-10-CM | POA: Diagnosis not present

## 2015-03-20 DIAGNOSIS — D270 Benign neoplasm of right ovary: Secondary | ICD-10-CM | POA: Insufficient documentation

## 2015-03-20 DIAGNOSIS — G43909 Migraine, unspecified, not intractable, without status migrainosus: Secondary | ICD-10-CM | POA: Insufficient documentation

## 2015-03-20 DIAGNOSIS — N8320 Unspecified ovarian cysts: Secondary | ICD-10-CM | POA: Diagnosis not present

## 2015-03-20 DIAGNOSIS — Z8673 Personal history of transient ischemic attack (TIA), and cerebral infarction without residual deficits: Secondary | ICD-10-CM | POA: Insufficient documentation

## 2015-03-20 DIAGNOSIS — J45909 Unspecified asthma, uncomplicated: Secondary | ICD-10-CM | POA: Diagnosis not present

## 2015-03-20 DIAGNOSIS — N83201 Unspecified ovarian cyst, right side: Secondary | ICD-10-CM

## 2015-03-20 DIAGNOSIS — R1900 Intra-abdominal and pelvic swelling, mass and lump, unspecified site: Secondary | ICD-10-CM | POA: Diagnosis present

## 2015-03-20 DIAGNOSIS — K219 Gastro-esophageal reflux disease without esophagitis: Secondary | ICD-10-CM | POA: Insufficient documentation

## 2015-03-20 HISTORY — PX: ROBOTIC ASSISTED LAPAROSCOPIC OVARIAN CYSTECTOMY: SHX6081

## 2015-03-20 SURGERY — ROBOTIC ASSISTED LAPAROSCOPIC OVARIAN CYSTECTOMY
Anesthesia: General | Site: Abdomen | Laterality: Right

## 2015-03-20 MED ORDER — ROCURONIUM BROMIDE 100 MG/10ML IV SOLN
INTRAVENOUS | Status: DC | PRN
  Administered 2015-03-20 (×2): 10 mg via INTRAVENOUS
  Administered 2015-03-20: 40 mg via INTRAVENOUS

## 2015-03-20 MED ORDER — LIDOCAINE HCL (CARDIAC) 20 MG/ML IV SOLN
INTRAVENOUS | Status: DC | PRN
  Administered 2015-03-20: 100 mg via INTRAVENOUS

## 2015-03-20 MED ORDER — OXYCODONE-ACETAMINOPHEN 5-325 MG PO TABS
ORAL_TABLET | ORAL | Status: AC
  Filled 2015-03-20: qty 1

## 2015-03-20 MED ORDER — SODIUM CHLORIDE 0.9 % IJ SOLN
INTRAMUSCULAR | Status: AC
  Filled 2015-03-20: qty 50

## 2015-03-20 MED ORDER — PROMETHAZINE HCL 25 MG/ML IJ SOLN: 6.2500 mg | INTRAMUSCULAR | Status: DC | PRN

## 2015-03-20 MED ORDER — GLYCOPYRROLATE 0.2 MG/ML IJ SOLN
INTRAMUSCULAR | Status: AC
  Filled 2015-03-20: qty 3

## 2015-03-20 MED ORDER — ROCURONIUM BROMIDE 100 MG/10ML IV SOLN
INTRAVENOUS | Status: AC
  Filled 2015-03-20: qty 1

## 2015-03-20 MED ORDER — HYDROMORPHONE HCL 1 MG/ML IJ SOLN
INTRAMUSCULAR | Status: AC
Start: 2015-03-20 — End: 2015-03-20
  Filled 2015-03-20: qty 1

## 2015-03-20 MED ORDER — HYDROMORPHONE HCL 1 MG/ML IJ SOLN
INTRAMUSCULAR | Status: DC | PRN
  Administered 2015-03-20: 1 mg via INTRAVENOUS

## 2015-03-20 MED ORDER — NEOSTIGMINE METHYLSULFATE 10 MG/10ML IV SOLN
INTRAVENOUS | Status: DC | PRN
Start: 2015-03-20 — End: 2015-03-20
  Administered 2015-03-20 (×2): 1.5 mg via INTRAVENOUS

## 2015-03-20 MED ORDER — MIDAZOLAM HCL 2 MG/2ML IJ SOLN
INTRAMUSCULAR | Status: AC
  Filled 2015-03-20: qty 2

## 2015-03-20 MED ORDER — KETOROLAC TROMETHAMINE 30 MG/ML IJ SOLN
INTRAMUSCULAR | Status: AC
  Filled 2015-03-20: qty 2

## 2015-03-20 MED ORDER — DEXAMETHASONE SODIUM PHOSPHATE 10 MG/ML IJ SOLN
INTRAMUSCULAR | Status: AC
  Filled 2015-03-20: qty 1

## 2015-03-20 MED ORDER — KETOROLAC TROMETHAMINE 30 MG/ML IJ SOLN
INTRAMUSCULAR | Status: DC | PRN
  Administered 2015-03-20: 30 mg via INTRAVENOUS
  Administered 2015-03-20: 30 mg via INTRAMUSCULAR

## 2015-03-20 MED ORDER — LACTATED RINGERS IV SOLN
INTRAVENOUS | Status: DC
  Administered 2015-03-20 (×3): via INTRAVENOUS

## 2015-03-20 MED ORDER — NEOSTIGMINE METHYLSULFATE 10 MG/10ML IV SOLN
INTRAVENOUS | Status: AC
  Filled 2015-03-20: qty 1

## 2015-03-20 MED ORDER — ARTIFICIAL TEARS OP OINT
TOPICAL_OINTMENT | OPHTHALMIC | Status: AC
  Filled 2015-03-20: qty 3.5

## 2015-03-20 MED ORDER — FENTANYL CITRATE (PF) 100 MCG/2ML IJ SOLN
INTRAMUSCULAR | Status: DC | PRN
  Administered 2015-03-20 (×2): 100 ug via INTRAVENOUS

## 2015-03-20 MED ORDER — GLYCOPYRROLATE 0.2 MG/ML IJ SOLN
INTRAMUSCULAR | Status: DC | PRN
  Administered 2015-03-20 (×3): 0.2 mg via INTRAVENOUS

## 2015-03-20 MED ORDER — ONDANSETRON HCL 4 MG/2ML IJ SOLN
INTRAMUSCULAR | Status: AC
  Filled 2015-03-20: qty 2

## 2015-03-20 MED ORDER — LIDOCAINE HCL (CARDIAC) 20 MG/ML IV SOLN
INTRAVENOUS | Status: AC
  Filled 2015-03-20: qty 5

## 2015-03-20 MED ORDER — SODIUM CHLORIDE 0.9 % IV SOLN
INTRAVENOUS | Status: DC | PRN
  Administered 2015-03-20: 120 mL

## 2015-03-20 MED ORDER — MEPERIDINE HCL 25 MG/ML IJ SOLN: 6.2500 mg | INTRAMUSCULAR | Status: DC | PRN

## 2015-03-20 MED ORDER — PROPOFOL 10 MG/ML IV BOLUS
INTRAVENOUS | Status: AC
  Filled 2015-03-20: qty 20

## 2015-03-20 MED ORDER — OXYCODONE-ACETAMINOPHEN 5-325 MG PO TABS
1.0000 | ORAL_TABLET | ORAL | Status: DC | PRN
  Administered 2015-03-20: 1 via ORAL

## 2015-03-20 MED ORDER — OXYCODONE-ACETAMINOPHEN 5-325 MG PO TABS
1.0000 | ORAL_TABLET | Freq: Four times a day (QID) | ORAL | Status: DC | PRN
Start: 2015-03-20 — End: 2015-04-02

## 2015-03-20 MED ORDER — PROPOFOL 10 MG/ML IV BOLUS
INTRAVENOUS | Status: DC | PRN
  Administered 2015-03-20: 150 mg via INTRAVENOUS

## 2015-03-20 MED ORDER — LACTATED RINGERS IR SOLN
Status: DC | PRN
  Administered 2015-03-20: 3000 mL

## 2015-03-20 MED ORDER — SCOPOLAMINE 1 MG/3DAYS TD PT72
MEDICATED_PATCH | TRANSDERMAL | Status: AC
  Administered 2015-03-20: 1.5 mg via TRANSDERMAL
  Filled 2015-03-20: qty 1

## 2015-03-20 MED ORDER — DEXAMETHASONE SODIUM PHOSPHATE 10 MG/ML IJ SOLN
INTRAMUSCULAR | Status: DC | PRN
  Administered 2015-03-20: 10 mg via INTRAVENOUS

## 2015-03-20 MED ORDER — FENTANYL CITRATE (PF) 100 MCG/2ML IJ SOLN
INTRAMUSCULAR | Status: AC
  Administered 2015-03-20: 50 ug via INTRAVENOUS
  Filled 2015-03-20: qty 2

## 2015-03-20 MED ORDER — SCOPOLAMINE 1 MG/3DAYS TD PT72
1.0000 | MEDICATED_PATCH | Freq: Once | TRANSDERMAL | Status: AC
  Administered 2015-03-20 (×2): 1.5 mg via TRANSDERMAL

## 2015-03-20 MED ORDER — FENTANYL CITRATE (PF) 100 MCG/2ML IJ SOLN
25.0000 ug | INTRAMUSCULAR | Status: DC | PRN
  Administered 2015-03-20 (×2): 50 ug via INTRAVENOUS

## 2015-03-20 MED ORDER — SODIUM CHLORIDE 0.9 % IJ SOLN
INTRAMUSCULAR | Status: AC
  Filled 2015-03-20: qty 10

## 2015-03-20 MED ORDER — FENTANYL CITRATE (PF) 250 MCG/5ML IJ SOLN
INTRAMUSCULAR | Status: AC
  Filled 2015-03-20: qty 5

## 2015-03-20 MED ORDER — ONDANSETRON HCL 4 MG/2ML IJ SOLN
INTRAMUSCULAR | Status: DC | PRN
Start: 2015-03-20 — End: 2015-03-20
  Administered 2015-03-20: 4 mg via INTRAVENOUS

## 2015-03-20 MED ORDER — IBUPROFEN 600 MG PO TABS: ORAL_TABLET | ORAL | Status: DC

## 2015-03-20 MED ORDER — MIDAZOLAM HCL 2 MG/2ML IJ SOLN
INTRAMUSCULAR | Status: DC | PRN
  Administered 2015-03-20: 2 mg via INTRAVENOUS

## 2015-03-20 MED ORDER — ROPIVACAINE HCL 5 MG/ML IJ SOLN
INTRAMUSCULAR | Status: AC
  Filled 2015-03-20: qty 60

## 2015-03-20 SURGICAL SUPPLY — 60 items
APL SKNCLS STERI-STRIP NONHPOA (GAUZE/BANDAGES/DRESSINGS) ×1
BARRIER ADHS 3X4 INTERCEED (GAUZE/BANDAGES/DRESSINGS) ×7 IMPLANT
BENZOIN TINCTURE PRP APPL 2/3 (GAUZE/BANDAGES/DRESSINGS) ×3 IMPLANT
BRR ADH 4X3 ABS CNTRL BYND (GAUZE/BANDAGES/DRESSINGS) ×3
CATH FOLEY 3WAY  5CC 16FR (CATHETERS) ×4
CATH FOLEY 3WAY  5CC 18FR (CATHETERS) ×2
CATH FOLEY 3WAY 5CC 16FR (CATHETERS) ×2 IMPLANT
CATH FOLEY 3WAY 5CC 18FR (CATHETERS) ×1 IMPLANT
CHLORAPREP W/TINT 26ML (MISCELLANEOUS) ×3 IMPLANT
CLOSURE WOUND 1/2 X4 (GAUZE/BANDAGES/DRESSINGS) ×1
CLOTH BEACON ORANGE TIMEOUT ST (SAFETY) ×3 IMPLANT
CONT PATH 16OZ SNAP LID 3702 (MISCELLANEOUS) ×3 IMPLANT
CORD BIPOLAR FORCEPS 12FT (ELECTRODE) IMPLANT
COVER BACK TABLE 60X90IN (DRAPES) ×6 IMPLANT
COVER TIP SHEARS 8 DVNC (MISCELLANEOUS) ×1 IMPLANT
COVER TIP SHEARS 8MM DA VINCI (MISCELLANEOUS) ×2
DECANTER SPIKE VIAL GLASS SM (MISCELLANEOUS) ×3 IMPLANT
DRAPE WARM FLUID 44X44 (DRAPE) ×3 IMPLANT
DRSG COVADERM PLUS 2X2 (GAUZE/BANDAGES/DRESSINGS) ×10 IMPLANT
DRSG OPSITE POSTOP 3X4 (GAUZE/BANDAGES/DRESSINGS) ×3 IMPLANT
ELECT REM PT RETURN 9FT ADLT (ELECTROSURGICAL) ×3
ELECTRODE REM PT RTRN 9FT ADLT (ELECTROSURGICAL) ×1 IMPLANT
GAUZE VASELINE 3X9 (GAUZE/BANDAGES/DRESSINGS) IMPLANT
GLOVE BIOGEL PI IND STRL 7.0 (GLOVE) ×2 IMPLANT
GLOVE BIOGEL PI INDICATOR 7.0 (GLOVE) ×16
GLOVE ECLIPSE 6.5 STRL STRAW (GLOVE) ×9 IMPLANT
GLOVE ECLIPSE 7.0 STRL STRAW (GLOVE) ×6 IMPLANT
GLOVE SURG SS PI 7.0 STRL IVOR (GLOVE) ×18 IMPLANT
KIT ACCESSORY DA VINCI DISP (KITS) ×2
KIT ACCESSORY DVNC DISP (KITS) ×1 IMPLANT
LIQUID BAND (GAUZE/BANDAGES/DRESSINGS) ×3 IMPLANT
NS IRRIG 1000ML POUR BTL (IV SOLUTION) ×9 IMPLANT
OCCLUDER COLPOPNEUMO (BALLOONS) IMPLANT
PACK ROBOT WH (CUSTOM PROCEDURE TRAY) ×3 IMPLANT
PACK ROBOTIC GOWN (GOWN DISPOSABLE) ×3 IMPLANT
PAD POSITIONER PINK NONSTERILE (MISCELLANEOUS) ×3 IMPLANT
PAD PREP 24X48 CUFFED NSTRL (MISCELLANEOUS) ×6 IMPLANT
SET CYSTO W/LG BORE CLAMP LF (SET/KITS/TRAYS/PACK) IMPLANT
SET IRRIG TUBING LAPAROSCOPIC (IRRIGATION / IRRIGATOR) ×3 IMPLANT
SET TRI-LUMEN FLTR TB AIRSEAL (TUBING) IMPLANT
STRIP CLOSURE SKIN 1/2X4 (GAUZE/BANDAGES/DRESSINGS) ×2 IMPLANT
SUT MNCRL AB 3-0 PS2 27 (SUTURE) IMPLANT
SUT VIC AB 0 CT1 27 (SUTURE) ×18
SUT VIC AB 0 CT1 27XBRD ANTBC (SUTURE) ×6 IMPLANT
SUT VIC AB 0 CT2 27 (SUTURE) IMPLANT
SUT VIC AB 2-0 CT2 27 (SUTURE) ×3 IMPLANT
SUT VICRYL 0 UR6 27IN ABS (SUTURE) ×6 IMPLANT
SYR 50ML LL SCALE MARK (SYRINGE) ×3 IMPLANT
SYSTEM CONVERTIBLE TROCAR (TROCAR) ×3 IMPLANT
TIP UTERINE 5.1X6CM LAV DISP (MISCELLANEOUS) IMPLANT
TIP UTERINE 6.7X10CM GRN DISP (MISCELLANEOUS) IMPLANT
TIP UTERINE 6.7X6CM WHT DISP (MISCELLANEOUS) IMPLANT
TIP UTERINE 6.7X8CM BLUE DISP (MISCELLANEOUS) IMPLANT
TOWEL OR 17X24 6PK STRL BLUE (TOWEL DISPOSABLE) ×9 IMPLANT
TROCAR 12M 150ML BLUNT (TROCAR) ×3 IMPLANT
TROCAR DISP BLADELESS 8 DVNC (TROCAR) ×1 IMPLANT
TROCAR DISP BLADELESS 8MM (TROCAR) ×2
TROCAR PORT AIRSEAL 5X120 (TROCAR) IMPLANT
TROCAR XCEL 12X100 BLDLESS (ENDOMECHANICALS) ×3 IMPLANT
WARMER LAPAROSCOPE (MISCELLANEOUS) ×3 IMPLANT

## 2015-03-20 NOTE — Anesthesia Procedure Notes (Signed)
Procedure Name: Intubation Date/Time: 03/20/2015 1:26 PM Performed by: Mayer Camel, Naif Alabi A Pre-anesthesia Checklist: Patient identified, Emergency Drugs available, Suction available and Patient being monitored Patient Re-evaluated:Patient Re-evaluated prior to inductionOxygen Delivery Method: Circle system utilized Preoxygenation: Pre-oxygenation with 100% oxygen Intubation Type: IV induction Laryngoscope Size: Miller and 2 Grade View: Grade I Tube type: Oral Tube size: 7.0 mm Number of attempts: 1 Placement Confirmation: ETT inserted through vocal cords under direct vision,  positive ETCO2 and breath sounds checked- equal and bilateral Secured at: 21 cm Tube secured with: Tape

## 2015-03-20 NOTE — Interval H&P Note (Signed)
History and Physical Interval Note:  03/20/2015 1:04 PM  [name]  has presented today for surgery, with the diagnosis of Large Pelvic Mass  The various methods of treatment have been discussed with the patient and family. After consideration of risks, benefits and other options for treatment, the patient has consented to  Procedure(s): ROBOTIC ASSISTED LAPAROSCOPIC Salpingectomy with Possible OVARIAN CYSTECTOMY (Right) as a surgical intervention .  The patient's history has been reviewed, patient examined, no change in status, stable for surgery.  I have reviewed the patient's chart and labs.  Questions were answered to the patient's satisfaction.     Maison Agrusa A

## 2015-03-20 NOTE — Discharge Instructions (Signed)
Call [facility] @ [phone] if:  You have a temperature greater than or equal to 100.4 degrees Farenheit orally You have pain that is not made better by the pain medication given and taken as directed You have  problems urinating  Take Colace (Docusate Sodium/Stool Softener) 100 mg 2-3 times daily while taking narcotic pain medicine to avoid constipation or until bowel movements are regular. Take Ibuprofen 600 mg with food every 6 hours for the next 5 days then as needed for pain  You may drive after 1 week You may walk up steps  You may shower tomorrow You may resume a regular diet  Keep incisions clean and dry Do not lift over 15 pounds for 6 weeks Avoid anything in vagina for 6 weeks (or until after your post-operative visit)  Keep your post-op appointment with Dr Cletis Media on 04/02/15   Post Anesthesia Home Care Instructions  Activity: Get plenty of rest for the remainder of the day. A responsible adult should stay with you for 24 hours following the procedure.  For the next 24 hours, DO NOT: -Drive a car -Paediatric nurse -Drink alcoholic beverages -Take any medication unless instructed by your physician -Make any legal decisions or sign important papers.  Meals: Start with liquid foods such as gelatin or soup. Progress to regular foods as tolerated. Avoid greasy, spicy, heavy foods. If nausea and/or vomiting occur, drink only clear liquids until the nausea and/or vomiting subsides. Call your physician if vomiting continues.  Special Instructions/Symptoms: Your throat may feel dry or sore from the anesthesia or the breathing tube placed in your throat during surgery. If this causes discomfort, gargle with warm salt water. The discomfort should disappear within 24 hours.  If you had a scopolamine patch placed behind your ear for the management of post- operative nausea and/or vomiting:  1. The medication in the patch is effective for 72 hours, after which it  should be removed.  Wrap patch in a tissue and discard in the trash. Wash hands thoroughly with soap and water. 2. You may remove the patch earlier than 72 hours if you experience unpleasant side effects which may include dry mouth, dizziness or visual disturbances. 3. Avoid touching the patch. Wash your hands with soap and water after contact with the patch.

## 2015-03-20 NOTE — Op Note (Signed)
Preoperative diagnosis: Pelvic mass  Postoperative diagnosis: Large peritoneal pseudocyst, right ovarian cyst and pelvic adhesions  Anesthesia: General   Anesthesiologist: Dr. Lorenza Chick  Procedure: Robotically assisted right ovarian cystectomy, resection of peritoneal pseudocyst, lysis of adhesions and pelvic washings  Surgeon: Dr. Katharine Look Okema Rollinson   Assistant: Earnstine Regal P.A.-C .  Estimated blood loss: minimal  Procedure:   After being informed of the planned procedure with possible complications including but not limited to bleeding, infection, injury to other organs, need for laparotomy, expected hospital stay and recovery, informed consent is obtained and patient is taken to or #7. She is placed in lithotomy position on a Pink pad with both arms padded and tucked on each side and bilateral knee-high sequential compressive devices. She is given general anesthesia with endotracheal intubation without any complication. She is prepped and draped in a sterile fashion. A Foley catheter is inserted in her bladder.  Trocar placement is decided. We infiltrate 1 cm above the umbilicus with 10 cc of ropivacaine per protocol and perform a 10 mm semi-elleptical incision, while removing the cheloid, and bring it  down bluntly to the fascia. The fascia is identified and grasped with Coker forceps. The fascia is incised with Mayo scissors. Peritoneum is entered bluntly. A pursestring suture of 0 Vicryl is placed on the fascia and a 10 mm Hassan trocar is easily inserted in the abdominal cavity held in placed with a Purstring suture. This allows for easy insufflation of a pneumoperitoneum using warmed CO2 at a maximum pressure of 15 mm of mercury. 60 cc of Ropivacaine 0.5 % diluted 1 in 1 is sent in the pelvis and the patient is positioned in reverse Trendelenburg. We then placed one 11mm robotic trocar on the left, one 68mm robotic trocar on the right and one 8 mm patient's side Air-Seal assistant  trocar on the right after infiltrating every site with ropivacaine per protocol. The robot is docked on the left of the patient after positioning her in Trendelenburg. A monopolar scissor is inserted in arm #1 and a PK gyrus forcep is inserted in arm #2.  Preparation and docking is completed in 27 minutes.   Observation: The pelvis is field with a multicystic mass that appears to arise from the right ovary. Right tube is normal. Left ovary has small adhesions grade 1 in 2 to the pelvic wall and left tube is absent. After mobilizing the right adnexa, we note that the ovary has a 5 cm thinwall ovarian cyst which ruptures upon blunt mobilization and that the ovary is engulfed in a large peritoneal pseudocyst.  We begin with pelvic washings.  Lifting the right ovary we are able to identify the pseudocyst which is taken down sharply using bipolar cauterization and monopolar scissors until all that they walls are resected completely. The ovarian cyst is then addressed and we proceed with blunt dissection of the ovarian cyst capsule away from the ovarian stroma until the cyst is completely removed. We then irrigated profusely and complete hemostasis on the ovarian stroma with bipolar cauterization. The right ureter is visualized and away from our site of resection and cauterization and shows good peristaltic activity. We then sharply address the adhesions of the left ovary until it completely freed. We then proceed with right salpingectomy using bipolar cauterization on the mesosalpinx and sharp dissection.  We irrigated profusely with warm saline and note a satisfactory hemostasis.  Each ovary is then completely wrapped in a full sheet of Interceed.   Console time is  59 minutes. Instruments are removed and the robot is undocked.  All instruments are then removed and the robot is undocked.   All trochars are removed under direct visualization after evacuating the pneumoperitoneum.   The fascia of  the supraumbilical incision is closed with the previously placed pursestring suture of 0 Vicryl.  All incisions are then closed with subcuticular suture of 3-0 Monocryl and Dermabond.   Instrument and sponge count is complete x2. Estimated blood loss is minimal.   The procedure is well tolerated by the patient who is taken to recovery room in a well and stable condition.   Specimen: pelvic washings, peritoneal cyst wall and ovarian cyst wall sent to pathology.

## 2015-03-20 NOTE — Transfer of Care (Signed)
Immediate Anesthesia Transfer of Care Note  Patient: [name]  Procedure(s) Performed: Procedure(s): ROBOTIC ASSISTED LAPAROSCOPIC Salpingectomy OVARIAN CYSTECTOMY, pelvic washings, excision of peritoneal pseudocyst. (Right)  Patient Location: PACU  Anesthesia Type:General  Level of Consciousness: awake  Airway & Oxygen Therapy: Patient Spontanous Breathing  Post-op Assessment: Report given to PACU RN  Post vital signs: stable  Filed Vitals:   03/20/15 1121  BP: 119/75  Pulse: 60  Temp: 36.8 C  Resp: 20    Complications: No apparent anesthesia complications

## 2015-03-20 NOTE — H&P (View-Only) (Signed)
[name] is a 44 y.o.  female for P 1-0-0-1  who presents for a  Robot Assisted Laparoscopic Salpingectomy and possible ovarian cystectomy because of large pelvic masses and pelvic pain.  In February of 2016 the patient was seen by her PCP for sharp left lower quadrant pelvic pain that was described as "steady"  for  2 weeks. She was found to have  a  normal CMET, CBC,  and negative urine culture .  She admitted to some relief with Ibuprofen though her symptoms were made worse when her bladder was full, she needed to void and with  coughing, sneezing, jarring or walking.  As time progressed the pain,  rated as 6/10 on a 10 point pain scale became more intermittent instead of constant.  She denies any changes in bowel function or dyspareunia.   A pelvic ultrasound, in March 2016 showed: S/P Supra-cervical Hysterectomy; Cul-de-sac mass = 7.8 cm with enlarged right ovary: 7.81 x 7.86 x 5.04 cm; ? septated and ? part of cul-de-sac mass, solid component - 4.4 cm and a normal left ovary.  A follow up Pelvic MRI revealed cystic appearing centrally located structure: 6.8 x 7.2 x 5.0 cm  possible hydrosalpinx  vs ovarian neoplasm and left adnexa with cystically appearing  4.8 x 3.2 x 3.0 cm.  The patient's CA-125 and CEA were normal.  Given the radiographic findings and persistence of the patient's symptoms she has decided to proceed with surgical management.   Past Medical History  OB History: G:1; P: 1-0-0-1;  SVB 1997   GYN History: menarche: 44 YO     LMP: 2013 S/P Hysterectomy   The patient reports a past history of: herpes.  Remote history of CIN-1  Last PAP smear: 05/09/2013-normal  Medical History: Transient Ischemic Attack (2013), Asthma, Hypertension, Renal Stones, Depression, Congenital Left Ear Deafness, Migraine and  Left Breast Ductal Papilloma  Surgical History: 2013  Robot Assisted Laparoscopic Supra-cervical Hysterectomy with Left Salpingectomy; 2012  Left Breast Needle Biopsy-benign,  2010  Laparoscopic Right Ovarian Cystectomies  (#3),   2010 Robot Assisted Myomectomy with Lysis of Adhesions,  2005 Left Breast Biopsy (benign),    2004 Abdominal Myomectomy,  2003 Diagnostic Laparoscopy with Ovarian Cystectomy (clinical findings of endometriosis though not confirmed by pathology report),  Denies problems with anesthesia; Jehovah's Witness-does not receive blood products   Family History: Heart Disease, Hypertension, Thyroid Disease, Lung Cancer, Breast Cancer and Substance Abuse  Social History: Single and employed with [employer]; Denies tobacco or alcohol use   Medication:  Lisinopril 20 mg daily Metoprolol Tartate 100 mg daily Multivitamin daily Spironolactone 25 mg daily Valacyclovir 500 mg  daily Aspirin 325 mg daily  No Known Allergies   Denies sensitivity to peanuts, shellfish, soy, latex or adhesives.   ROS:  Admits to eft ear hearing loss and  tinnitus but denies headache, vision changes, nasal congestion, dysphagia, tinnitus, dizziness, hoarseness, cough,  chest pain, shortness of breath, nausea, vomiting, diarrhea,constipation,  urinary frequency, urgency  dysuria, hematuria, vaginitis symptoms, pelvic pain, swelling of joints,easy bruising,  myalgias, arthralgias, skin rashes, unexplained weight loss and except as is mentioned in the history of present illness, patient's review of systems is otherwise negative.   Physical Exam  Bp: 110/64   P: 80    R: 20    Temperature: 98.4 degrees F orally  Weight: 190 lbs.  Height: 5'3"  BMI: 33.7  Neck: supple without masses or thyromegaly Lungs: clear to auscultation Heart: regular rate  and rhythm Abdomen: soft, tender to deep palpation and voluntary rebound but  no organomegaly Pelvic:EGBUS- wnl; vagina-normal rugae; uterus-surgically absent  cervix without lesions or motion tenderness; adnexae-bilateral tenderness  Extremities:  no clubbing, cyanosis or edema   Assesment: Pelvic Masses            Pelvic Pain  Disposition: The patient was given the indication for her procedure(s) along with the risks, which include but are not limited to, reaction to anesthesia, damage to adjacent organs, infection, excessive bleeding, formation of scar tissue,   and the possible need for an open abdominal incision. She was further advised that she will experience transient post operative facial edema and  that the robotic approach to her surgery requires more time to perform than an open abdominal approach.  Patient was given the Miralax bowel prep to be completed 24 hours prior to procedure.  She verbalized understanding of these risks and pre-operative instructions and has consented to proceed with a Robot Assisted Laparoscopic Salpingectomy with Possible Ovarian Cystectomy at [facility] on March 20, 2015 at 1 p.m.  CSN# [id number]   [address] Florene Glen, PA-C  for Dr. Dede Query. Rivard

## 2015-03-20 NOTE — Anesthesia Postprocedure Evaluation (Signed)
  Anesthesia Post-op Note  Patient: [name]  Procedure(s) Performed: Procedure(s): ROBOTIC ASSISTED LAPAROSCOPIC Salpingectomy OVARIAN CYSTECTOMY, pelvic washings, excision of peritoneal pseudocyst. (Right)  Patient Location: PACU  Anesthesia Type:General  Level of Consciousness: awake, alert  and oriented  Airway and Oxygen Therapy: Patient Spontanous Breathing  Post-op Pain: mild  Post-op Assessment: Post-op Vital signs reviewed, Patient's Cardiovascular Status Stable, Respiratory Function Stable, Patent Airway, No signs of Nausea or vomiting and Pain level controlled  Post-op Vital Signs: Reviewed and stable  Last Vitals:  Filed Vitals:   03/20/15 1630  BP: 126/77  Pulse: 61  Temp:   Resp: 16    Complications: No apparent anesthesia complications

## 2015-03-21 ENCOUNTER — Encounter (HOSPITAL_COMMUNITY): Payer: Self-pay | Admitting: Obstetrics and Gynecology

## 2015-04-02 ENCOUNTER — Emergency Department (HOSPITAL_COMMUNITY)
Admission: EM | Admit: 2015-04-02 | Discharge: 2015-04-02 | Disposition: A | Discharge status: Home / Self Care | Payer: No Typology Code available for payment source | Attending: Emergency Medicine | Admitting: Emergency Medicine

## 2015-04-02 ENCOUNTER — Emergency Department (HOSPITAL_COMMUNITY): Payer: No Typology Code available for payment source

## 2015-04-02 ENCOUNTER — Encounter (HOSPITAL_COMMUNITY): Payer: Self-pay

## 2015-04-02 DIAGNOSIS — S3991XA Unspecified injury of abdomen, initial encounter: Secondary | ICD-10-CM | POA: Insufficient documentation

## 2015-04-02 DIAGNOSIS — Z9889 Other specified postprocedural states: Secondary | ICD-10-CM | POA: Diagnosis not present

## 2015-04-02 DIAGNOSIS — Z8619 Personal history of other infectious and parasitic diseases: Secondary | ICD-10-CM | POA: Diagnosis not present

## 2015-04-02 DIAGNOSIS — J45909 Unspecified asthma, uncomplicated: Secondary | ICD-10-CM | POA: Diagnosis not present

## 2015-04-02 DIAGNOSIS — Z8673 Personal history of transient ischemic attack (TIA), and cerebral infarction without residual deficits: Secondary | ICD-10-CM | POA: Insufficient documentation

## 2015-04-02 DIAGNOSIS — Z792 Long term (current) use of antibiotics: Secondary | ICD-10-CM | POA: Diagnosis not present

## 2015-04-02 DIAGNOSIS — Z8719 Personal history of other diseases of the digestive system: Secondary | ICD-10-CM | POA: Diagnosis not present

## 2015-04-02 DIAGNOSIS — S199XXA Unspecified injury of neck, initial encounter: Secondary | ICD-10-CM | POA: Insufficient documentation

## 2015-04-02 DIAGNOSIS — Y998 Other external cause status: Secondary | ICD-10-CM | POA: Diagnosis not present

## 2015-04-02 DIAGNOSIS — S29092A Other injury of muscle and tendon of back wall of thorax, initial encounter: Secondary | ICD-10-CM | POA: Insufficient documentation

## 2015-04-02 DIAGNOSIS — S3992XA Unspecified injury of lower back, initial encounter: Secondary | ICD-10-CM | POA: Diagnosis not present

## 2015-04-02 DIAGNOSIS — H9191 Unspecified hearing loss, right ear: Secondary | ICD-10-CM | POA: Diagnosis not present

## 2015-04-02 DIAGNOSIS — Z7982 Long term (current) use of aspirin: Secondary | ICD-10-CM | POA: Insufficient documentation

## 2015-04-02 DIAGNOSIS — Y92410 Unspecified street and highway as the place of occurrence of the external cause: Secondary | ICD-10-CM | POA: Diagnosis not present

## 2015-04-02 DIAGNOSIS — S40011A Contusion of right shoulder, initial encounter: Secondary | ICD-10-CM | POA: Diagnosis not present

## 2015-04-02 DIAGNOSIS — Y9389 Activity, other specified: Secondary | ICD-10-CM | POA: Diagnosis not present

## 2015-04-02 DIAGNOSIS — Z79899 Other long term (current) drug therapy: Secondary | ICD-10-CM | POA: Insufficient documentation

## 2015-04-02 DIAGNOSIS — M5489 Other dorsalgia: Secondary | ICD-10-CM

## 2015-04-02 DIAGNOSIS — S4991XA Unspecified injury of right shoulder and upper arm, initial encounter: Secondary | ICD-10-CM | POA: Diagnosis present

## 2015-04-02 DIAGNOSIS — R011 Cardiac murmur, unspecified: Secondary | ICD-10-CM | POA: Insufficient documentation

## 2015-04-02 LAB — COMPREHENSIVE METABOLIC PANEL
ALBUMIN: 4 g/dL (ref 3.5–5.0)
ALK PHOS: 77 U/L (ref 38–126)
ALT: 18 U/L (ref 14–54)
AST: 17 U/L (ref 15–41)
Anion gap: 6 (ref 5–15)
BUN: 8 mg/dL (ref 6–20)
CALCIUM: 9.7 mg/dL (ref 8.9–10.3)
CO2: 27 mmol/L (ref 22–32)
Chloride: 106 mmol/L (ref 101–111)
Creatinine, Ser: 0.79 mg/dL (ref 0.44–1.00)
GFR calc Af Amer: >60 mL/min (ref >60)
GFR calc non Af Amer: >60 mL/min (ref >60)
Glucose, Bld: 98 mg/dL (ref 70–99)
POTASSIUM: 4 mmol/L (ref 3.5–5.1)
SODIUM: 139 mmol/L (ref 135–145)
TOTAL PROTEIN: 7.6 g/dL (ref 6.5–8.1)
Total Bilirubin: 0.9 mg/dL (ref 0.3–1.2)

## 2015-04-02 LAB — CBC WITH DIFFERENTIAL/PLATELET
BASOS ABS: 0.1 10*3/uL (ref 0.0–0.1)
Basophils Relative: 1 % (ref 0–1)
EOS ABS: 0.4 10*3/uL (ref 0.0–0.7)
EOS PCT: 4 % (ref 0–5)
HCT: 38.4 % (ref 36.0–46.0)
Hemoglobin: 12.8 g/dL (ref 12.0–15.0)
Lymphocytes Relative: 31 % (ref 12–46)
Lymphs Abs: 3.1 10*3/uL (ref 0.7–4.0)
MCH: 29.7 pg (ref 26.0–34.0)
MCHC: 33.3 g/dL (ref 30.0–36.0)
MCV: 89.1 fL (ref 78.0–100.0)
Monocytes Absolute: 0.5 10*3/uL (ref 0.1–1.0)
Monocytes Relative: 5 % (ref 3–12)
NEUTROS ABS: 6 10*3/uL (ref 1.7–7.7)
NEUTROS PCT: 59 % (ref 43–77)
Platelets: 313 10*3/uL (ref 150–400)
RBC: 4.31 MIL/uL (ref 3.87–5.11)
RDW: 13.2 % (ref 11.5–15.5)
WBC: 9.9 10*3/uL (ref 4.0–10.5)

## 2015-04-02 MED ORDER — IOHEXOL 300 MG/ML  SOLN
100.0000 mL | Freq: Once | INTRAMUSCULAR | Status: AC | PRN
  Administered 2015-04-02: 100 mL via INTRAVENOUS

## 2015-04-02 MED ORDER — OXYCODONE-ACETAMINOPHEN 5-325 MG PO TABS: 1.0000 | ORAL_TABLET | Freq: Four times a day (QID) | ORAL | Status: DC | PRN

## 2015-04-02 MED ORDER — MORPHINE SULFATE 4 MG/ML IJ SOLN
4.0000 mg | Freq: Once | INTRAMUSCULAR | Status: AC
  Administered 2015-04-02: 4 mg via INTRAVENOUS
  Filled 2015-04-02: qty 1
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
## 2015-04-08 ENCOUNTER — Encounter: Payer: Self-pay | Admitting: Family Medicine

## 2015-04-09 NOTE — Telephone Encounter (Signed)
She probably need f/u here--- not quite sure what she is asking

## 2015-04-10 ENCOUNTER — Encounter: Payer: Self-pay | Admitting: Family Medicine

## 2015-04-10 ENCOUNTER — Ambulatory Visit (INDEPENDENT_AMBULATORY_CARE_PROVIDER_SITE_OTHER): Payer: Managed Care, Other (non HMO) | Admitting: Family Medicine

## 2015-04-10 VITALS — BP 136/84 | HR 89 | Temp 98.5°F | Wt 187.8 lb

## 2015-04-10 DIAGNOSIS — R1012 Left upper quadrant pain: Secondary | ICD-10-CM | POA: Insufficient documentation

## 2015-04-10 NOTE — Assessment & Plan Note (Signed)
Discussed CT with radiology Pt developed pain after original ct Repeat ct Consider surgery referral if needed
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
## 2015-04-10 NOTE — Patient Instructions (Signed)

## 2015-04-10 NOTE — Progress Notes (Signed)
Pre visit review using our clinic review tool, if applicable. No additional management support is needed unless otherwise documented below in the visit note. 

## 2015-04-20 ENCOUNTER — Ambulatory Visit (HOSPITAL_BASED_OUTPATIENT_CLINIC_OR_DEPARTMENT_OTHER)
Admission: RE | Admit: 2015-04-20 | Discharge: 2015-04-20 | Disposition: A | Discharge status: Home / Self Care | Payer: Managed Care, Other (non HMO) | Source: Ambulatory Visit | Attending: Family Medicine | Admitting: Family Medicine

## 2015-04-20 DIAGNOSIS — R1012 Left upper quadrant pain: Secondary | ICD-10-CM | POA: Diagnosis not present

## 2015-04-20 MED ORDER — IOHEXOL 300 MG/ML  SOLN
100.0000 mL | Freq: Once | INTRAMUSCULAR | Status: AC | PRN
  Administered 2015-04-20: 100 mL via INTRAVENOUS

## 2015-06-25 ENCOUNTER — Encounter: Payer: Self-pay | Admitting: Family Medicine

## 2015-06-25 NOTE — Telephone Encounter (Signed)
To MD to review and advise      KP 

## 2015-07-03 ENCOUNTER — Ambulatory Visit (INDEPENDENT_AMBULATORY_CARE_PROVIDER_SITE_OTHER): Payer: Managed Care, Other (non HMO) | Admitting: Family Medicine

## 2015-07-03 ENCOUNTER — Encounter: Payer: Self-pay | Admitting: Family Medicine

## 2015-07-03 VITALS — BP 136/90 | HR 71 | Temp 98.4°F | Wt 197.6 lb

## 2015-07-03 DIAGNOSIS — M25511 Pain in right shoulder: Secondary | ICD-10-CM

## 2015-07-03 DIAGNOSIS — M5440 Lumbago with sciatica, unspecified side: Secondary | ICD-10-CM | POA: Diagnosis not present

## 2015-07-03 MED ORDER — MELOXICAM 15 MG PO TABS: 15.0000 mg | ORAL_TABLET | Freq: Every day | ORAL | Status: DC

## 2015-07-03 MED ORDER — CYCLOBENZAPRINE HCL 10 MG PO TABS: 10.0000 mg | ORAL_TABLET | Freq: Three times a day (TID) | ORAL | Status: DC | PRN
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
## 2015-07-03 NOTE — Progress Notes (Signed)
Pre visit review using our clinic review tool, if applicable. No additional management support is needed unless otherwise documented below in the visit note. 

## 2015-07-03 NOTE — Patient Instructions (Signed)

## 2015-07-04 ENCOUNTER — Ambulatory Visit (HOSPITAL_BASED_OUTPATIENT_CLINIC_OR_DEPARTMENT_OTHER)
Admission: RE | Admit: 2015-07-04 | Discharge: 2015-07-04 | Disposition: A | Discharge status: Home / Self Care | Payer: Managed Care, Other (non HMO) | Source: Ambulatory Visit | Attending: Family Medicine | Admitting: Family Medicine

## 2015-07-04 DIAGNOSIS — M5136 Other intervertebral disc degeneration, lumbar region: Secondary | ICD-10-CM | POA: Diagnosis not present

## 2015-07-04 DIAGNOSIS — M545 Low back pain: Secondary | ICD-10-CM | POA: Diagnosis present

## 2015-07-04 DIAGNOSIS — M8938 Hypertrophy of bone, other site: Secondary | ICD-10-CM | POA: Insufficient documentation

## 2015-07-04 DIAGNOSIS — M5440 Lumbago with sciatica, unspecified side: Secondary | ICD-10-CM

## 2015-07-04 DIAGNOSIS — M4806 Spinal stenosis, lumbar region: Secondary | ICD-10-CM | POA: Insufficient documentation

## 2015-07-10 ENCOUNTER — Encounter: Payer: Self-pay | Admitting: Family Medicine

## 2015-07-10 ENCOUNTER — Ambulatory Visit (INDEPENDENT_AMBULATORY_CARE_PROVIDER_SITE_OTHER): Payer: Managed Care, Other (non HMO) | Admitting: Family Medicine

## 2015-07-10 VITALS — BP 125/85 | HR 73 | Ht 63.0 in | Wt 192.0 lb

## 2015-07-10 DIAGNOSIS — M75101 Unspecified rotator cuff tear or rupture of right shoulder, not specified as traumatic: Secondary | ICD-10-CM

## 2015-07-10 DIAGNOSIS — M25511 Pain in right shoulder: Secondary | ICD-10-CM

## 2015-07-10 DIAGNOSIS — M7541 Impingement syndrome of right shoulder: Secondary | ICD-10-CM

## 2015-07-10 MED ORDER — NITROGLYCERIN 0.2 MG/HR TD PT24: MEDICATED_PATCH | TRANSDERMAL | Status: DC

## 2015-07-10 NOTE — Patient Instructions (Signed)
You have rotator cuff impingement Try to avoid painful activities (overhead activities, lifting with extended arm) as much as possible. Consider aleve 2 tabs twice a day with food OR ibuprofen 3 tabs three times a day with food for pain and inflammation. Can take tylenol in addition to this. Subacromial injection may be beneficial to help with pain and to decrease inflammation. Start physical therapy with transition to home exercise program. Do home exercise program with theraband and scapular stabilization exercises daily - these are very important for long term relief even if an injection was given. Nitro patches 1/4th patch over affected shoulder, change daily. If not improving at follow-up we will consider further imaging, injection. Follow up with me in 6 weeks.

## 2015-07-11 DIAGNOSIS — M25511 Pain in right shoulder: Secondary | ICD-10-CM | POA: Insufficient documentation

## 2015-07-11 NOTE — Assessment & Plan Note (Signed)
2/2 rotator cuff impingement.  Discussed options - she would like to start with physical therapy, nitro patches (discussed risks of headache, skin irritation), home exercises.  F/u in 6 weeks.

## 2015-07-11 NOTE — Progress Notes (Signed)
PCP and referred by: Garnet Koyanagi, DO  Subjective:   HPI: Patient is a 44 y.o. female here for right shoulder pain.  Patient denies known injury or trauma. Reports pain has been ongoing since march, worse recently. Difficulty raising arm overhead, lifting purse, dressing self. Tried stretching. Cannot sleep on side. Right handed.  Past Medical History  Diagnosis Date  . Allergy   . Asthma   . Hypertension     pulmonary  . Herpes genitalia   . H/O: myomectomy   . GERD (gastroesophageal reflux disease)     no meds  . Heart murmur   . TIA (transient ischemic attack)   . Refusal of blood transfusions as patient is Jehovah's Witness   . Hearing impaired     can hear on left side only    Current Outpatient Prescriptions on File Prior to Visit  Medication Sig Dispense Refill  . albuterol (PROVENTIL HFA;VENTOLIN HFA) 108 (90 BASE) MCG/ACT inhaler Inhale 2 puffs into the lungs every 6 (six) hours as needed for wheezing. 1 Inhaler 2  . Ascorbic Acid (VITAMIN C) 1000 MG tablet Take 1,000 mg by mouth daily.    Marland Kitchen aspirin 325 MG tablet Take 1 tablet (325 mg total) by mouth daily.    . cetirizine (ZYRTEC) 10 MG tablet Take 10 mg by mouth daily.    . cholecalciferol (VITAMIN D) 1000 UNITS tablet Take 1,000 Units by mouth daily.    . cyclobenzaprine (FLEXERIL) 10 MG tablet Take 1 tablet (10 mg total) by mouth 3 (three) times daily as needed for muscle spasms. 30 tablet 0  . ibuprofen (ADVIL,MOTRIN) 600 MG tablet Take 600 mg by mouth every 6 (six) hours as needed.    Marland Kitchen lisinopril (PRINIVIL,ZESTRIL) 20 MG tablet Take 1 tablet (20 mg total) by mouth daily. 90 tablet 3  . meloxicam (MOBIC) 15 MG tablet Take 1 tablet (15 mg total) by mouth daily. 30 tablet 0  . metoprolol (LOPRESSOR) 100 MG tablet Take 1 tablet (100 mg total) by mouth 2 (two) times daily. 180 tablet 3  . Multiple Vitamins-Calcium (ONE-A-DAY WOMENS PO) Take 1 tablet by mouth daily.    Marland Kitchen spironolactone (ALDACTONE) 25 MG tablet  Take 1 tablet (25 mg total) by mouth daily. 90 tablet 3  . valACYclovir (VALTREX) 500 MG tablet Take 500 mg by mouth daily.      No current facility-administered medications on file prior to visit.    Past Surgical History  Procedure Laterality Date  . Myomectomy  2004      2010 robotic  . Ovarian cyst removal  2010    x2  . Breast lumpectomy  01/06/11  . Robotic assisted lap vaginal hysterectomy    . Cystoscopy  05/19/2012    Procedure: CYSTOSCOPY;  Surgeon: Alwyn Pea, MD;  Location: [facility];  Service: Gynecology;  Laterality: N/A;  . Robotic assisted laparoscopic ovarian cystectomy Right 03/20/2015    Procedure: ROBOTIC ASSISTED LAPAROSCOPIC Salpingectomy OVARIAN CYSTECTOMY, pelvic washings, excision of peritoneal pseudocyst.;  Surgeon: Delsa Bern, MD;  Location: [facility];  Service: Gynecology;  Laterality: Right;    No Known Allergies  Social History   Social History  . Marital Status: Single    Spouse Name: N/A  . Number of Children: N/A  . Years of Education: N/A   Occupational History  . Not on file.   Social History Main Topics  . Smoking status: Never Smoker   . Smokeless tobacco: Never Used  . Alcohol Use: No  .  Drug Use: No  . Sexual Activity:    Partners: Male    Birth Control/ Protection: Surgical     Comment: hyst   Other Topics Concern  . Not on file   Social History Narrative   Exercise---walk, zumba, [employer] video    Family History  Problem Relation Age of Onset  . Asthma Mother   . Arthritis Mother   . Hypertension Father   . Heart disease Maternal Grandmother     PCI  . Cancer Maternal Grandmother 81    breast  . Cancer Other 60    breast and possible colon  . Diabetes Brother   . Migraines Sister     BP 125/85 mmHg  Pulse 73  Ht 5\' 3"  (1.6 m)  Wt 192 lb (87.091 kg)  BMI 34.02 kg/m2  LMP 04/18/2012  Review of Systems: See HPI above.    Objective:  Physical Exam:  Gen: NAD  Right shoulder: No swelling,  ecchymoses.  No gross deformity. No TTP. FROM with painful arc. Positive Hawkins, Neers. Negative Speeds, Yergasons. Strength 5/5 with empty can and resisted internal/external rotation.  Painful empty can, Er. Negative apprehension. NV intact distally.    Assessment & Plan:  1. Right shoulder pain - 2/2 rotator cuff impingement.  Discussed options - she would like to start with physical therapy, nitro patches (discussed risks of headache, skin irritation), home exercises.  F/u in 6 weeks.

## 2015-07-13 ENCOUNTER — Other Ambulatory Visit: Payer: Self-pay

## 2015-07-13 DIAGNOSIS — M5136 Other intervertebral disc degeneration, lumbar region: Secondary | ICD-10-CM

## 2015-07-13 DIAGNOSIS — M545 Low back pain: Secondary | ICD-10-CM

## 2015-07-17 ENCOUNTER — Ambulatory Visit: Payer: 59 | Admitting: Physical Therapy

## 2015-07-17 ENCOUNTER — Ambulatory Visit: Payer: 59 | Attending: Family Medicine | Admitting: Physical Therapy

## 2015-07-17 DIAGNOSIS — M545 Low back pain, unspecified: Secondary | ICD-10-CM

## 2015-07-17 DIAGNOSIS — R29898 Other symptoms and signs involving the musculoskeletal system: Secondary | ICD-10-CM | POA: Insufficient documentation

## 2015-07-17 DIAGNOSIS — M25511 Pain in right shoulder: Secondary | ICD-10-CM | POA: Diagnosis not present

## 2015-07-17 DIAGNOSIS — M25611 Stiffness of right shoulder, not elsewhere classified: Secondary | ICD-10-CM | POA: Insufficient documentation
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
## 2015-07-19 ENCOUNTER — Ambulatory Visit: Payer: 59 | Admitting: Physical Therapy

## 2015-07-19 DIAGNOSIS — M25511 Pain in right shoulder: Secondary | ICD-10-CM | POA: Diagnosis not present

## 2015-07-19 DIAGNOSIS — M545 Low back pain, unspecified: Secondary | ICD-10-CM

## 2015-07-19 DIAGNOSIS — M25611 Stiffness of right shoulder, not elsewhere classified: Secondary | ICD-10-CM

## 2015-07-19 DIAGNOSIS — R29898 Other symptoms and signs involving the musculoskeletal system: Secondary | ICD-10-CM

## 2015-07-19 NOTE — Therapy (Signed)
[facility] [address]  [place], [place], [address] Phone: [phone]   Fax:  [phone]  Physical Therapy Treatment  Patient Details  Name: [name] MRN: [id number] Date of Birth: 1971/07/16 Referring Provider:  Dene Gentry, MD  Encounter Date: 07/19/2015      PT End of Session - 07/19/15 1707    Visit Number 2   Number of Visits 16   Date for PT Re-Evaluation 09/11/15   PT Start Time 7253   PT Stop Time 1751   PT Time Calculation (min) 46 min   Activity Tolerance Patient limited by pain   Behavior During Therapy [facility] for tasks assessed/performed      Past Medical History  Diagnosis Date  . Allergy   . Asthma   . Hypertension     pulmonary  . Herpes genitalia   . H/O: myomectomy   . GERD (gastroesophageal reflux disease)     no meds  . Heart murmur   . TIA (transient ischemic attack)   . Refusal of blood transfusions as patient is Jehovah's Witness   . Hearing impaired     can hear on left side only    Past Surgical History  Procedure Laterality Date  . Myomectomy  2004      2010 robotic  . Ovarian cyst removal  2010    x2  . Breast lumpectomy  01/06/11  . Robotic assisted lap vaginal hysterectomy    . Cystoscopy  05/19/2012    Procedure: CYSTOSCOPY;  Surgeon: Alwyn Pea, MD;  Location: [facility];  Service: Gynecology;  Laterality: N/A;  . Robotic assisted laparoscopic ovarian cystectomy Right 03/20/2015    Procedure: ROBOTIC ASSISTED LAPAROSCOPIC Salpingectomy OVARIAN CYSTECTOMY, pelvic washings, excision of peritoneal pseudocyst.;  Surgeon: Delsa Bern, MD;  Location: [facility];  Service: Gynecology;  Laterality: Right;    There were no vitals filed for this visit.  Visit Diagnosis:  Right shoulder pain  Stiffness of right shoulder joint  Weakness of right arm  Midline low back pain without sciatica      Subjective Assessment - 07/19/15 1707    Subjective Patient  reporting using HEP to "loosen up" in the morning, but has questions about some of the stretches. States back pain elevated secondary to rushing to get here.   Currently in Pain? Yes   Pain Score 3    Pain Location Shoulder   Pain Orientation Right   Pain Score 7   Pain Location Back   Pain Orientation Mid;Lower                   [facility] Adult PT Treatment/Exercise - 07/19/15 1705    Exercises   Exercises Shoulder   Lumbar Exercises: Stretches   Quadruped Mid Back Stretch 20 seconds;3 reps   Quadruped Mid Back Stretch Limitations Prayer stretch L/C/R   Shoulder Exercises: Supine   Protraction Right;10 reps   Protraction Limitations Serratus punch   Horizontal ABduction Strengthening;10 reps;Theraband   Theraband Level (Shoulder Horizontal ABduction) Level 2 (Red)   Horizontal ABduction Limitations Hooklying on 1/2 foam roll   External Rotation Limitations deferred due to pain   Flexion AAROM;Both;10 reps;Weights   Shoulder Flexion Weight (lbs) 3   Flexion Limitations Hooklying on 1/2 foam roll - pullover   Other Supine Exercises Alternating bilateral Horiz Abd/Add x10 hooklying on 1/2 foam roll   Shoulder Exercises: Sidelying   Other Sidelying Exercises Horizontal Abd to vertical at  90 dg shoulder flexion x10   Shoulder Exercises: Standing   Row Strengthening;Both;10 reps;Theraband  2 sets   Theraband Level (Shoulder Row) Level 2 (Red)   Row Limitations Low row   Shoulder Exercises: ROM/Strengthening   UBE (Upper Arm Bike) lvl 1 fwd/back 2' each   Shoulder Exercises: Stretch   Corner Stretch 20 seconds;3 reps   Other Shoulder Stretches Chest stretch on 1/2 foam rolll   Modalities   Modalities Vasopneumatic   Vasopneumatic   Number Minutes Vasopneumatic  10 minutes   Vasopnuematic Location  Shoulder  Pillowcase under cuff secondary to sleeveless shirt   Vasopneumatic Pressure Low   Vasopneumatic Temperature  Lowest   Manual Therapy   Manual Therapy Joint  mobilization;Soft tissue mobilization;Scapular mobilization   Joint Mobilization Grade I-II distraction, inferior & A/P glides   Soft tissue mobilization DTM and manual stretch to pectoralis muscle in Lt sidelying   Scapular Mobilization All directions to reduce guarding with patient in Lt sidelying                  PT Short Term Goals - 07/17/15 1912    PT SHORT TERM GOAL #1   Title Independent with initial HEP (08/14/15)   Time 4   Period Weeks   Status New   PT SHORT TERM GOAL #2   Title Patient will demonstrate Right shoudler ROM to within 10 degrees of left shoulder without increased pain (08/14/15)   PT SHORT TERM GOAL #3   Title Patient will report worst pain in right shoulder no greater than 6/10 (08/14/15)   Time 4   Period Weeks   Status New           PT Long Term Goals - 07/17/15 1915    PT LONG TERM GOAL #1   Title Independent with advanced HEP (09/11/15)   Time 8   Period Weeks   Status New   PT LONG TERM GOAL #2   Title Patient will demonstrate right shoulder ROM WFL for functional use of right UE during ADLs (09/11/15)   Time 8   Period Weeks   Status New   PT LONG TERM GOAL #3   Title Patient will report worst pain in right shoulder no greater than 4/10 (09/11/15)   Time 8   Period Weeks   Status New   PT LONG TERM GOAL #4   Title Patient will demonstrate right shoulder strength >/= 4/5 without pain (09/11/15)   Time 8   Period Weeks   Status New   PT LONG TERM GOAL #5   Title Patient will report worst pain in low back no greater than 5/10   Time 8   Period Weeks   Status New   Additional Long Term Goals   Additional Long Term Goals Yes   PT LONG TERM GOAL #6   Title Patient will tolerate standing for at least 20 minutes without increased low back pain to improve abillity to complete household chores (09/11/15)   Time 8   Period Weeks   Status New   PT LONG TERM GOAL #7   Title Patient will report ability to walk at least 1 mile  without increased low back pain (09/11/15)   Time 8   Period Weeks   Status New               Plan - 07/19/15 1752    Clinical Impression Statement Reviewed and clarified prayer stretch and corner stretch from HEP, with patient  reporting no concerns with remaning stretches/exercises. Pain limiting some exercise attempts, especially any movements in ER or horizontal abduction past neutral (open book stretch), therefore increased emphasis on manual stretching and STM.   PT Next Visit Plan Initial primary focus on RIght shoulder - postural training, stretches/ROM, strengthening, manual therapy, modalities PRN   Consulted and Agree with Plan of Care Patient        Problem List Patient Active Problem List   Diagnosis Date Noted  . Right shoulder pain 07/11/2015  . LUQ pain 04/10/2015  . Vaginal discharge 02/15/2015  . UTI symptoms 01/15/2015  . Bacterial vaginitis 12/24/2014  . Exposure to STD 06/11/2013  . Headache(784.0) 11/12/2012  . Hypokalemia 11/10/2012  . TIA (transient ischemic attack) 11/09/2012  . Nerve compression 05/20/2012  . GERD (gastroesophageal reflux disease)   . Herpes genitalia   . H/O: myomectomy   . Fibroid uterus 03/17/2012  . PULMONARY HYPERTENSION 11/06/2010  . Morbid obesity 12/04/2008  . Essential hypertension 10/22/2007  . HPV 03/24/2007  . LOSS, HEARING NOS 03/24/2007  . ASTHMA 03/24/2007    Percival Spanish, PT, MPT 07/19/2015, 6:06 PM  [facility] [address]  [place], [place], [address] Phone: [phone]   Fax:  [phone]

## 2015-07-23 ENCOUNTER — Ambulatory Visit: Payer: 59 | Admitting: Rehabilitation

## 2015-07-23 ENCOUNTER — Encounter: Payer: Self-pay | Admitting: Rehabilitation

## 2015-07-23 DIAGNOSIS — M25611 Stiffness of right shoulder, not elsewhere classified: Secondary | ICD-10-CM

## 2015-07-23 DIAGNOSIS — R29898 Other symptoms and signs involving the musculoskeletal system: Secondary | ICD-10-CM

## 2015-07-23 DIAGNOSIS — M25511 Pain in right shoulder: Secondary | ICD-10-CM

## 2015-07-23 NOTE — Therapy (Signed)
[facility] [address]  [place], [place], [address] Phone: [phone]   Fax:  [phone]  Physical Therapy Treatment  Patient Details  Name: [name] MRN: [id number] Date of Birth: [birth year] Referring Provider:  Dene Gentry, MD  Encounter Date: 07/23/2015      PT End of Session - 07/23/15 1749    Visit Number 3   Number of Visits 16   Date for PT Re-Evaluation 09/11/15   PT Start Time 1708   PT Stop Time 1800   PT Time Calculation (min) 52 min   Activity Tolerance Patient limited by pain      Past Medical History  Diagnosis Date  . Allergy   . Asthma   . Hypertension     pulmonary  . Herpes genitalia   . H/O: myomectomy   . GERD (gastroesophageal reflux disease)     no meds  . Heart murmur   . TIA (transient ischemic attack)   . Refusal of blood transfusions as patient is Jehovah's Witness   . Hearing impaired     can hear on left side only    Past Surgical History  Procedure Laterality Date  . Myomectomy  2004      2010 robotic  . Ovarian cyst removal  2010    x2  . Breast lumpectomy  01/06/11  . Robotic assisted lap vaginal hysterectomy    . Cystoscopy  05/19/2012    Procedure: CYSTOSCOPY;  Surgeon: Alwyn Pea, MD;  Location: [facility];  Service: Gynecology;  Laterality: N/A;  . Robotic assisted laparoscopic ovarian cystectomy Right 03/20/2015    Procedure: ROBOTIC ASSISTED LAPAROSCOPIC Salpingectomy OVARIAN CYSTECTOMY, pelvic washings, excision of peritoneal pseudocyst.;  Surgeon: Delsa Bern, MD;  Location: [facility];  Service: Gynecology;  Laterality: Right;    There were no vitals filed for this visit.  Visit Diagnosis:  Right shoulder pain  Stiffness of right shoulder joint  Weakness of right arm      Subjective Assessment - 07/23/15 1710    Subjective a little sore earlier but better after stretches.  worked out this weekend   Currently in Pain? No/denies  3/10  earlier                         [facility] Adult PT Treatment/Exercise - 07/23/15 0001    Shoulder Exercises: Supine   Protraction Right;10 reps;Weights  2 sets   Protraction Weight (lbs) 2#   Protraction Limitations Serratus punch   Horizontal ABduction Strengthening;Both;10 reps  2 sets   Theraband Level (Shoulder Horizontal ABduction) Level 2 (Red)   External Rotation --  2 sets   External Rotation Limitations still painful today   Internal Rotation Strengthening;10 reps;Right  2 sets   Theraband Level (Shoulder Internal Rotation) Level 2 (Red)   Flexion Strengthening;Right;10 reps   Shoulder Flexion Weight (lbs) 2   Other Supine Exercises ER isometric holds with red band manual resistance 10"x5   Shoulder Exercises: Standing   Row Strengthening;Both;10 reps;Theraband  2 sets   Theraband Level (Shoulder Row) Level 2 (Red)   Row Limitations Low row   Shoulder Exercises: ROM/Strengthening   UBE (Upper Arm Bike) lvl 1 fwd/back 2' each   Modalities   Modalities Vasopneumatic   Vasopneumatic   Number Minutes Vasopneumatic  10 minutes   Vasopnuematic Location  Shoulder   Vasopneumatic Pressure Low   Vasopneumatic Temperature  Lowest   Manual Therapy  Joint Mobilization grade I-II AP and inferior glides at neutral to decrease guarding and for painrelief   Soft tissue mobilization DTM and manual stretch to pectoralis muscle in Lt sidelying   Scapular Mobilization All directions to reduce guarding with patient in Lt sidelying                  PT Short Term Goals - 07/17/15 1912    PT SHORT TERM GOAL #1   Title Independent with initial HEP (08/14/15)   Time 4   Period Weeks   Status New   PT SHORT TERM GOAL #2   Title Patient will demonstrate Right shoudler ROM to within 10 degrees of left shoulder without increased pain (08/14/15)   PT SHORT TERM GOAL #3   Title Patient will report worst pain in right shoulder no greater than 6/10 (08/14/15)   Time 4    Period Weeks   Status New           PT Long Term Goals - 07/17/15 1915    PT LONG TERM GOAL #1   Title Independent with advanced HEP (09/11/15)   Time 8   Period Weeks   Status New   PT LONG TERM GOAL #2   Title Patient will demonstrate right shoulder ROM WFL for functional use of right UE during ADLs (09/11/15)   Time 8   Period Weeks   Status New   PT LONG TERM GOAL #3   Title Patient will report worst pain in right shoulder no greater than 4/10 (09/11/15)   Time 8   Period Weeks   Status New   PT LONG TERM GOAL #4   Title Patient will demonstrate right shoulder strength >/= 4/5 without pain (09/11/15)   Time 8   Period Weeks   Status New   PT LONG TERM GOAL #5   Title Patient will report worst pain in low back no greater than 5/10   Time 8   Period Weeks   Status New   Additional Long Term Goals   Additional Long Term Goals Yes   PT LONG TERM GOAL #6   Title Patient will tolerate standing for at least 20 minutes without increased low back pain to improve abillity to complete household chores (09/11/15)   Time 8   Period Weeks   Status New   PT LONG TERM GOAL #7   Title Patient will report ability to walk at least 1 mile without increased low back pain (09/11/15)   Time 8   Period Weeks   Status New               Plan - 07/23/15 1750    Clinical Impression Statement pt with increased pain during ER, and flexion TE today.  Also very guarded with PROM and joint mob attempts even grade I-II.  Better after performing scapular PROM in sidelying.    PT Next Visit Plan Initial primary focus on RIght shoulder - postural training, stretches/ROM, strengthening, manual therapy, modalities PRN        Problem List Patient Active Problem List   Diagnosis Date Noted  . Right shoulder pain 07/11/2015  . LUQ pain 04/10/2015  . Vaginal discharge 02/15/2015  . UTI symptoms 01/15/2015  . Bacterial vaginitis 12/24/2014  . Exposure to STD 06/11/2013  .  Headache(784.0) 11/12/2012  . Hypokalemia 11/10/2012  . TIA (transient ischemic attack) 11/09/2012  . Nerve compression 05/20/2012  . GERD (gastroesophageal reflux disease)   . Herpes genitalia   .  H/O: myomectomy   . Fibroid uterus 03/17/2012  . PULMONARY HYPERTENSION 11/06/2010  . Morbid obesity 12/04/2008  . Essential hypertension 10/22/2007  . HPV 03/24/2007  . LOSS, HEARING NOS 03/24/2007  . ASTHMA 03/24/2007    Stark Bray, DPT, CMP 07/23/2015, 5:52 PM  [facility] [address]  [place], [place], [address] Phone: [phone]   Fax:  [phone]

## 2015-07-25 ENCOUNTER — Ambulatory Visit: Payer: 59 | Admitting: Rehabilitation

## 2015-07-25 DIAGNOSIS — R29898 Other symptoms and signs involving the musculoskeletal system: Secondary | ICD-10-CM

## 2015-07-25 DIAGNOSIS — M545 Low back pain, unspecified: Secondary | ICD-10-CM

## 2015-07-25 DIAGNOSIS — M25511 Pain in right shoulder: Secondary | ICD-10-CM | POA: Diagnosis not present

## 2015-07-25 DIAGNOSIS — M25611 Stiffness of right shoulder, not elsewhere classified: Secondary | ICD-10-CM

## 2015-07-25 NOTE — Therapy (Signed)
[facility] [address]  [place], [place], [address] Phone: [phone]   Fax:  [phone]  Physical Therapy Treatment  Patient Details  Name: [name] MRN: [id number] Date of Birth: 11/19/1971 Referring Provider:  Dene Gentry, MD  Encounter Date: 07/25/2015      PT End of Session - 07/25/15 1702    Visit Number 4   Number of Visits 16   Date for PT Re-Evaluation 09/11/15   PT Start Time 2725   PT Stop Time 1756   PT Time Calculation (min) 54 min      Past Medical History  Diagnosis Date  . Allergy   . Asthma   . Hypertension     pulmonary  . Herpes genitalia   . H/O: myomectomy   . GERD (gastroesophageal reflux disease)     no meds  . Heart murmur   . TIA (transient ischemic attack)   . Refusal of blood transfusions as patient is Jehovah's Witness   . Hearing impaired     can hear on left side only    Past Surgical History  Procedure Laterality Date  . Myomectomy  2004      2010 robotic  . Ovarian cyst removal  2010    x2  . Breast lumpectomy  01/06/11  . Robotic assisted lap vaginal hysterectomy    . Cystoscopy  05/19/2012    Procedure: CYSTOSCOPY;  Surgeon: Alwyn Pea, MD;  Location: [facility];  Service: Gynecology;  Laterality: N/A;  . Robotic assisted laparoscopic ovarian cystectomy Right 03/20/2015    Procedure: ROBOTIC ASSISTED LAPAROSCOPIC Salpingectomy OVARIAN CYSTECTOMY, pelvic washings, excision of peritoneal pseudocyst.;  Surgeon: Delsa Bern, MD;  Location: [facility];  Service: Gynecology;  Laterality: Right;    There were no vitals filed for this visit.  Visit Diagnosis:  Right shoulder pain  Stiffness of right shoulder joint  Weakness of right arm  Midline low back pain without sciatica      Subjective Assessment - 07/25/15 1704    Subjective Reports pain isn't too bad today, just notes some soreness. Her back was stiff earlier but she was able to perform her  stretches and that made it feel better.    Currently in Pain? No/denies                         [facility] Adult PT Treatment/Exercise - 07/25/15 1705    Exercises   Exercises Shoulder   Shoulder Exercises: Supine   Protraction Right;10 reps;Weights  2 sets   Protraction Weight (lbs) 2#   Protraction Limitations Serratus punch   Horizontal ABduction Strengthening;Both;10 reps  2 sets of 10   Theraband Level (Shoulder Horizontal ABduction) Level 2 (Red)   Flexion AAROM;Both;10 reps   Shoulder Flexion Weight (lbs) 3   Other Supine Exercises ER/IR isometric holds 10x3", Gentle distraction with pt reducing 10x3"   Other Supine Exercises Circles at 90 degrees flexion 2# x10 CW/CCW   Shoulder Exercises: Sidelying   Other Sidelying Exercises Horizontal Abd to vertical at 90 dg shoulder flexion x10   Shoulder Exercises: Standing   Row Strengthening;Both;10 reps;Theraband  2 sets   Theraband Level (Shoulder Row) Level 2 (Red)   Shoulder Exercises: ROM/Strengthening   UBE (Upper Arm Bike) lvl 1 fwd/back 2' each   Modalities   Modalities Vasopneumatic   Vasopneumatic   Number Minutes Vasopneumatic  10 minutes   Vasopnuematic Location  Shoulder  Vasopneumatic Pressure Low   Vasopneumatic Temperature  Lowest   Manual Therapy   Joint Mobilization Grade I-II AP and inferior glides, Grade I-II distraction   Soft tissue mobilization DTM and manual stretch to pectoralis muscle in Lt sidelying   Scapular Mobilization All directions to reduce guarding with patient in Lt sidelying                  PT Short Term Goals - 07/17/15 1912    PT SHORT TERM GOAL #1   Title Independent with initial HEP (08/14/15)   Time 4   Period Weeks   Status New   PT SHORT TERM GOAL #2   Title Patient will demonstrate Right shoudler ROM to within 10 degrees of left shoulder without increased pain (08/14/15)   PT SHORT TERM GOAL #3   Title Patient will report worst pain in right shoulder  no greater than 6/10 (08/14/15)   Time 4   Period Weeks   Status New           PT Long Term Goals - 07/17/15 1915    PT LONG TERM GOAL #1   Title Independent with advanced HEP (09/11/15)   Time 8   Period Weeks   Status New   PT LONG TERM GOAL #2   Title Patient will demonstrate right shoulder ROM WFL for functional use of right UE during ADLs (09/11/15)   Time 8   Period Weeks   Status New   PT LONG TERM GOAL #3   Title Patient will report worst pain in right shoulder no greater than 4/10 (09/11/15)   Time 8   Period Weeks   Status New   PT LONG TERM GOAL #4   Title Patient will demonstrate right shoulder strength >/= 4/5 without pain (09/11/15)   Time 8   Period Weeks   Status New   PT LONG TERM GOAL #5   Title Patient will report worst pain in low back no greater than 5/10   Time 8   Period Weeks   Status New   Additional Long Term Goals   Additional Long Term Goals Yes   PT LONG TERM GOAL #6   Title Patient will tolerate standing for at least 20 minutes without increased low back pain to improve abillity to complete household chores (09/11/15)   Time 8   Period Weeks   Status New   PT LONG TERM GOAL #7   Title Patient will report ability to walk at least 1 mile without increased low back pain (09/11/15)   Time 8   Period Weeks   Status New               Plan - 07/25/15 1749    Clinical Impression Statement Continued with manual work and gentle exercises. Attempted more stability/isometric exercises with good tolerance. Very tender/guarded with joint mobes still.    PT Next Visit Plan Initial primary focus on RIght shoulder - postural training, stretches/ROM, strengthening, manual therapy, modalities PRN   Consulted and Agree with Plan of Care Patient        Problem List Patient Active Problem List   Diagnosis Date Noted  . Right shoulder pain 07/11/2015  . LUQ pain 04/10/2015  . Vaginal discharge 02/15/2015  . UTI symptoms 01/15/2015  .  Bacterial vaginitis 12/24/2014  . Exposure to STD 06/11/2013  . Headache(784.0) 11/12/2012  . Hypokalemia 11/10/2012  . TIA (transient ischemic attack) 11/09/2012  . Nerve compression 05/20/2012  . GERD (  gastroesophageal reflux disease)   . Herpes genitalia   . H/O: myomectomy   . Fibroid uterus 03/17/2012  . PULMONARY HYPERTENSION 11/06/2010  . Morbid obesity 12/04/2008  . Essential hypertension 10/22/2007  . HPV 03/24/2007  . LOSS, HEARING NOS 03/24/2007  . ASTHMA 03/24/2007    Barbette Hair, PTA 07/25/2015, 5:59 PM  [facility] [address]  [place], [place], [address] Phone: [phone]   Fax:  [phone]

## 2015-07-31 ENCOUNTER — Ambulatory Visit: Payer: 59 | Attending: Family Medicine | Admitting: Rehabilitation

## 2015-07-31 ENCOUNTER — Encounter: Payer: Self-pay | Admitting: Family Medicine

## 2015-07-31 DIAGNOSIS — M25511 Pain in right shoulder: Secondary | ICD-10-CM

## 2015-07-31 DIAGNOSIS — M25611 Stiffness of right shoulder, not elsewhere classified: Secondary | ICD-10-CM | POA: Diagnosis present

## 2015-07-31 DIAGNOSIS — R29898 Other symptoms and signs involving the musculoskeletal system: Secondary | ICD-10-CM | POA: Insufficient documentation

## 2015-07-31 DIAGNOSIS — M545 Low back pain, unspecified: Secondary | ICD-10-CM

## 2015-07-31 NOTE — Therapy (Signed)
[facility] [address]  [place], [place], [address] Phone: [phone]   Fax:  [phone]  Physical Therapy Treatment  Patient Details  Name: [name] MRN: [id number] Date of Birth: May 08, 1971 Referring Provider:  Dene Gentry, MD  Encounter Date: 07/31/2015      PT End of Session - 07/31/15 1703    Visit Number 5   Number of Visits 16   Date for PT Re-Evaluation 09/11/15   PT Start Time 0174   PT Stop Time 9449   PT Time Calculation (min) 51 min      Past Medical History  Diagnosis Date  . Allergy   . Asthma   . Hypertension     pulmonary  . Herpes genitalia   . H/O: myomectomy   . GERD (gastroesophageal reflux disease)     no meds  . Heart murmur   . TIA (transient ischemic attack)   . Refusal of blood transfusions as patient is Jehovah's Witness   . Hearing impaired     can hear on left side only    Past Surgical History  Procedure Laterality Date  . Myomectomy  2004      2010 robotic  . Ovarian cyst removal  2010    x2  . Breast lumpectomy  01/06/11  . Robotic assisted lap vaginal hysterectomy    . Cystoscopy  05/19/2012    Procedure: CYSTOSCOPY;  Surgeon: Alwyn Pea, MD;  Location: [facility];  Service: Gynecology;  Laterality: N/A;  . Robotic assisted laparoscopic ovarian cystectomy Right 03/20/2015    Procedure: ROBOTIC ASSISTED LAPAROSCOPIC Salpingectomy OVARIAN CYSTECTOMY, pelvic washings, excision of peritoneal pseudocyst.;  Surgeon: Delsa Bern, MD;  Location: [facility];  Service: Gynecology;  Laterality: Right;    There were no vitals filed for this visit.  Visit Diagnosis:  Right shoulder pain  Stiffness of right shoulder joint  Weakness of right arm  Midline low back pain without sciatica      Subjective Assessment - 07/31/15 1704    Subjective Reports she had a full weekend of family reunion so her shoulder got a work out.    Currently in Pain? Yes   Pain  Score 4    Pain Location Shoulder   Pain Orientation Right                         [facility] Adult PT Treatment/Exercise - 07/31/15 1716    Exercises   Exercises Shoulder   Shoulder Exercises: Supine   Protraction Right;15 reps;Weights  2 sets   Protraction Weight (lbs) 2#   Protraction Limitations Serratus punch   Horizontal ABduction Strengthening;Both;10 reps  2 sets   Theraband Level (Shoulder Horizontal ABduction) Level 2 (Red)   Flexion AAROM;Both;10 reps;Strengthening;Right  1st set Bil pullover, 2nd set Rt only   Shoulder Flexion Weight (lbs) 3   ABduction 10 reps;Both;Strengthening  Abduction iso with pullover   Theraband Level (Shoulder ABduction) Level 2 (Red)   Other Supine Exercises ER/IR isometric holds 10x3", Gentle distraction with pt reducing 10x3"   Other Supine Exercises Circles at 90 degrees flexion 2# x15 CW/CCW   Shoulder Exercises: Sidelying   External Rotation AROM;10 reps;Right   Other Sidelying Exercises Horizontal Abd to vertical at 90 dg shoulder flexion x10   Shoulder Exercises: Standing   Row Strengthening;Both;Theraband;15 reps  2 sets: 1 set with both, second with Rt only   Theraband Level (Shoulder Row)  Level 2 (Red)   Modalities   Modalities Vasopneumatic   Vasopneumatic   Number Minutes Vasopneumatic  10 minutes   Vasopnuematic Location  Shoulder   Vasopneumatic Pressure Low   Vasopneumatic Temperature  Lowest   Manual Therapy   Joint Mobilization Grade I-II AP and inferior glides, Grade I-II distraction, STM to Rt pec/bicep   Scapular Mobilization All directions to reduce guarding with patient in Lt sidelying                  PT Short Term Goals - 07/31/15 1745    PT SHORT TERM GOAL #1   Title Independent with initial HEP (08/14/15)   Status On-going   PT SHORT TERM GOAL #2   Title Patient will demonstrate Right shoudler ROM to within 10 degrees of left shoulder without increased pain (08/14/15)   Status  On-going   PT SHORT TERM GOAL #3   Title Patient will report worst pain in right shoulder no greater than 6/10 (08/14/15)   Status On-going           PT Long Term Goals - 07/31/15 1745    PT LONG TERM GOAL #1   Title Independent with advanced HEP (09/11/15)   Status On-going   PT LONG TERM GOAL #2   Title Patient will demonstrate right shoulder ROM WFL for functional use of right UE during ADLs (09/11/15)   Status On-going   PT LONG TERM GOAL #3   Title Patient will report worst pain in right shoulder no greater than 4/10 (09/11/15)   Status On-going   PT LONG TERM GOAL #4   Title Patient will demonstrate right shoulder strength >/= 4/5 without pain (09/11/15)   Status On-going   PT LONG TERM GOAL #5   Title Patient will report worst pain in low back no greater than 5/10   Status On-going   PT LONG TERM GOAL #6   Title Patient will tolerate standing for at least 20 minutes without increased low back pain to improve abillity to complete household chores (09/11/15)   Status On-going   PT LONG TERM GOAL #7   Title Patient will report ability to walk at least 1 mile without increased low back pain (09/11/15)   Status On-going               Plan - 07/31/15 1742    Clinical Impression Statement Good progress with exercises today and minimal increase in pain. Pt able to tolerate AROM ER in side-lying well.    PT Next Visit Plan Continue Rt shoulder focus- posture training, stretches/ROM, strengthening, manual, modalities.    Consulted and Agree with Plan of Care Patient        Problem List Patient Active Problem List   Diagnosis Date Noted  . Right shoulder pain 07/11/2015  . LUQ pain 04/10/2015  . Vaginal discharge 02/15/2015  . UTI symptoms 01/15/2015  . Bacterial vaginitis 12/24/2014  . Exposure to STD 06/11/2013  . Headache(784.0) 11/12/2012  . Hypokalemia 11/10/2012  . TIA (transient ischemic attack) 11/09/2012  . Nerve compression 05/20/2012  . GERD  (gastroesophageal reflux disease)   . Herpes genitalia   . H/O: myomectomy   . Fibroid uterus 03/17/2012  . PULMONARY HYPERTENSION 11/06/2010  . Morbid obesity 12/04/2008  . Essential hypertension 10/22/2007  . HPV 03/24/2007  . LOSS, HEARING NOS 03/24/2007  . ASTHMA 03/24/2007    Barbette Hair, PTA 07/31/2015, 5:47 PM  [facility] [address]  [place], [place], [address] Phone: [phone]   Fax:  [phone]

## 2015-08-02 ENCOUNTER — Ambulatory Visit: Payer: 59 | Admitting: Rehabilitation

## 2015-08-02 DIAGNOSIS — M25511 Pain in right shoulder: Secondary | ICD-10-CM | POA: Diagnosis not present

## 2015-08-02 DIAGNOSIS — M25611 Stiffness of right shoulder, not elsewhere classified: Secondary | ICD-10-CM

## 2015-08-02 DIAGNOSIS — M545 Low back pain, unspecified: Secondary | ICD-10-CM

## 2015-08-02 DIAGNOSIS — R29898 Other symptoms and signs involving the musculoskeletal system: Secondary | ICD-10-CM

## 2015-08-02 NOTE — Therapy (Signed)
[facility] [address]  [place], [place], [address] Phone: [phone]   Fax:  [phone]  Physical Therapy Treatment  Patient Details  Name: [name] MRN: [id number] Date of Birth: 06-25-1971 Referring Provider:  Dene Gentry, MD  Encounter Date: 08/02/2015      PT End of Session - 08/02/15 1701    Visit Number 6   Number of Visits 16   Date for PT Re-Evaluation 09/11/15   PT Start Time 1701   PT Stop Time 1800   PT Time Calculation (min) 59 min   Activity Tolerance Patient tolerated treatment well   Behavior During Therapy [facility] for tasks assessed/performed      Past Medical History  Diagnosis Date  . Allergy   . Asthma   . Hypertension     pulmonary  . Herpes genitalia   . H/O: myomectomy   . GERD (gastroesophageal reflux disease)     no meds  . Heart murmur   . TIA (transient ischemic attack)   . Refusal of blood transfusions as patient is Jehovah's Witness   . Hearing impaired     can hear on left side only    Past Surgical History  Procedure Laterality Date  . Myomectomy  2004      2010 robotic  . Ovarian cyst removal  2010    x2  . Breast lumpectomy  01/06/11  . Robotic assisted lap vaginal hysterectomy    . Cystoscopy  05/19/2012    Procedure: CYSTOSCOPY;  Surgeon: Alwyn Pea, MD;  Location: [facility];  Service: Gynecology;  Laterality: N/A;  . Robotic assisted laparoscopic ovarian cystectomy Right 03/20/2015    Procedure: ROBOTIC ASSISTED LAPAROSCOPIC Salpingectomy OVARIAN CYSTECTOMY, pelvic washings, excision of peritoneal pseudocyst.;  Surgeon: Delsa Bern, MD;  Location: [facility];  Service: Gynecology;  Laterality: Right;    There were no vitals filed for this visit.  Visit Diagnosis:  Right shoulder pain  Stiffness of right shoulder joint  Weakness of right arm  Midline low back pain without sciatica      Subjective Assessment - 08/02/15 1702    Subjective  Reports her back is killing her now. States walking to and from the bathroom at work has started hurting. Thinks its from the busy weekend. Shoulder has been doing much better.    Currently in Pain? Yes   Pain Score --  3-4/10   Pain Location Shoulder   Pain Orientation Right   Multiple Pain Sites Yes   Pain Score 9   Pain Location Back   Pain Orientation Mid;Lower                         [facility] Adult PT Treatment/Exercise - 08/02/15 1705    Exercises   Exercises Shoulder   Shoulder Exercises: Supine   Protraction Right;Weights;10 reps  2 sets   Protraction Weight (lbs) 2#   Protraction Limitations 3   Horizontal ABduction Strengthening;Both;10 reps  2 sets   Theraband Level (Shoulder Horizontal ABduction) Level 2 (Red)   Flexion AAROM;Both;Strengthening;Right;15 reps  1 set bilateral, 2nd set Rt only   Shoulder Flexion Weight (lbs) 3   ABduction 10 reps;Both;Strengthening  Abduction iso with pullover   Theraband Level (Shoulder ABduction) Level 2 (Red)   Other Supine Exercises Circles at 90 degrees flexion 3# x15 CW/CCW   Shoulder Exercises: Sidelying   External Rotation AROM;10 reps;Right   ABduction AROM;Right;10 reps  ABduction Limitations Circles at 90 degrees abduction   Other Sidelying Exercises Horizontal Abd to vertical at 90 dg shoulder flexion x12   Shoulder Exercises: ROM/Strengthening   UBE (Upper Arm Bike) lvl 1 fwd/back 2' each   Modalities   Modalities Vasopneumatic   Vasopneumatic   Number Minutes Vasopneumatic  15 minutes   Vasopnuematic Location  Shoulder   Vasopneumatic Pressure Low   Vasopneumatic Temperature  Lowest   Manual Therapy   Joint Mobilization Grade I-II AP and inferior glides, Grade I-II distraction, STM to Rt pec/bicep   Soft tissue mobilization DTM and manual stretch to pectoralis muscle in Lt sidelying, STM to pec in supine   Scapular Mobilization All directions to reduce guarding with patient in Lt sidelying                   PT Short Term Goals - 07/31/15 1745    PT SHORT TERM GOAL #1   Title Independent with initial HEP (08/14/15)   Status On-going   PT SHORT TERM GOAL #2   Title Patient will demonstrate Right shoudler ROM to within 10 degrees of left shoulder without increased pain (08/14/15)   Status On-going   PT SHORT TERM GOAL #3   Title Patient will report worst pain in right shoulder no greater than 6/10 (08/14/15)   Status On-going           PT Long Term Goals - 07/31/15 1745    PT LONG TERM GOAL #1   Title Independent with advanced HEP (09/11/15)   Status On-going   PT LONG TERM GOAL #2   Title Patient will demonstrate right shoulder ROM WFL for functional use of right UE during ADLs (09/11/15)   Status On-going   PT LONG TERM GOAL #3   Title Patient will report worst pain in right shoulder no greater than 4/10 (09/11/15)   Status On-going   PT LONG TERM GOAL #4   Title Patient will demonstrate right shoulder strength >/= 4/5 without pain (09/11/15)   Status On-going   PT LONG TERM GOAL #5   Title Patient will report worst pain in low back no greater than 5/10   Status On-going   PT LONG TERM GOAL #6   Title Patient will tolerate standing for at least 20 minutes without increased low back pain to improve abillity to complete household chores (09/11/15)   Status On-going   PT LONG TERM GOAL #7   Title Patient will report ability to walk at least 1 mile without increased low back pain (09/11/15)   Status On-going               Plan - 08/02/15 1755    Clinical Impression Statement Improved tolerance to all exercises with minimal complaint of pain. May need to address Low back again next visit if pain is still up. May try more standing shoulder exercises.    PT Next Visit Plan Continue Rt shoulder focus- posture training, stretches/ROM, strengthening, manual, modalities.    Consulted and Agree with Plan of Care Patient        Problem List Patient  Active Problem List   Diagnosis Date Noted  . Right shoulder pain 07/11/2015  . LUQ pain 04/10/2015  . Vaginal discharge 02/15/2015  . UTI symptoms 01/15/2015  . Bacterial vaginitis 12/24/2014  . Exposure to STD 06/11/2013  . Headache(784.0) 11/12/2012  . Hypokalemia 11/10/2012  . TIA (transient ischemic attack) 11/09/2012  . Nerve compression 05/20/2012  . GERD (gastroesophageal reflux  disease)   . Herpes genitalia   . H/O: myomectomy   . Fibroid uterus 03/17/2012  . PULMONARY HYPERTENSION 11/06/2010  . Morbid obesity 12/04/2008  . Essential hypertension 10/22/2007  . HPV 03/24/2007  . LOSS, HEARING NOS 03/24/2007  . ASTHMA 03/24/2007    Barbette Hair, PTA 08/02/2015, 6:01 PM  [facility] [address]  [place], [place], [address] Phone: [phone]   Fax:  [phone]

## 2015-08-07 ENCOUNTER — Ambulatory Visit: Payer: 59 | Admitting: Physical Therapy

## 2015-08-07 DIAGNOSIS — M25511 Pain in right shoulder: Secondary | ICD-10-CM

## 2015-08-07 DIAGNOSIS — R29898 Other symptoms and signs involving the musculoskeletal system: Secondary | ICD-10-CM

## 2015-08-07 DIAGNOSIS — M25611 Stiffness of right shoulder, not elsewhere classified: Secondary | ICD-10-CM

## 2015-08-07 NOTE — Therapy (Signed)
[facility] [address]  [place], [place], [address] Phone: [phone]   Fax:  [phone]  Physical Therapy Treatment  Patient Details  Name: [name] MRN: [id number] Date of Birth: 1970-12-07 Referring Provider:  Dene Gentry, MD  Encounter Date: 08/07/2015      PT End of Session - 08/07/15 1708    Visit Number 7   Number of Visits 16   Date for PT Re-Evaluation 09/11/15   PT Start Time 1245   PT Stop Time 1757   PT Time Calculation (min) 52 min   Activity Tolerance Patient tolerated treatment well   Behavior During Therapy [facility] for tasks assessed/performed      Past Medical History  Diagnosis Date  . Allergy   . Asthma   . Hypertension     pulmonary  . Herpes genitalia   . H/O: myomectomy   . GERD (gastroesophageal reflux disease)     no meds  . Heart murmur   . TIA (transient ischemic attack)   . Refusal of blood transfusions as patient is Jehovah's Witness   . Hearing impaired     can hear on left side only    Past Surgical History  Procedure Laterality Date  . Myomectomy  2004      2010 robotic  . Ovarian cyst removal  2010    x2  . Breast lumpectomy  01/06/11  . Robotic assisted lap vaginal hysterectomy    . Cystoscopy  05/19/2012    Procedure: CYSTOSCOPY;  Surgeon: Alwyn Pea, MD;  Location: [facility];  Service: Gynecology;  Laterality: N/A;  . Robotic assisted laparoscopic ovarian cystectomy Right 03/20/2015    Procedure: ROBOTIC ASSISTED LAPAROSCOPIC Salpingectomy OVARIAN CYSTECTOMY, pelvic washings, excision of peritoneal pseudocyst.;  Surgeon: Delsa Bern, MD;  Location: [facility];  Service: Gynecology;  Laterality: Right;    There were no vitals filed for this visit.  Visit Diagnosis:  Right shoulder pain  Stiffness of right shoulder joint  Weakness of right arm      Subjective Assessment - 08/07/15 1708    Subjective Patient reports she did a 90 minute Zumba  class over the weekend and notes improvement in her back pain since, with no pain at present. Shoulder was more painful after Zumba up to 10/10, necessitating taking a muscle relaxant.   Currently in Pain? Yes   Pain Score 7    Pain Location Shoulder   Pain Orientation Right            [facility] PT Assessment - 08/07/15 1705    ROM / Strength   AROM / PROM / Strength AROM   AROM   AROM Assessment Site Shoulder   Right/Left Shoulder Right   Right Shoulder Flexion 164 Degrees   Right Shoulder ABduction 142 Degrees   Right Shoulder Internal Rotation 76 Degrees   Right Shoulder External Rotation 75 Degrees   Strength   Strength Assessment Site Shoulder   Right/Left Shoulder Right   Right Shoulder Flexion 4-/5   Right Shoulder ABduction 4-/5   Right Shoulder Internal Rotation 4-/5   Right Shoulder External Rotation 4-/5                 [facility] Adult PT Treatment/Exercise - 08/07/15 1705    Exercises   Exercises Shoulder   Shoulder Exercises: Supine   Protraction Right;Weights;10 reps  2 sets   Protraction Weight (lbs) 3   Protraction Limitations Serratus punch  Horizontal ABduction Strengthening;Both;10 reps  2 sets   Theraband Level (Shoulder Horizontal ABduction) Level 3 (Green)   Flexion AAROM;Both;Strengthening;Right;15 reps  1 set bilateral, 2nd set Rt only   Shoulder Flexion Weight (lbs) 3   Other Supine Exercises Circles at 90 degrees flexion 3# x15 CW/CCW   Shoulder Exercises: Sidelying   External Rotation AROM;10 reps;Right  2 sets   ABduction AROM;Right;10 reps  2 sets   ABduction Weight (lbs) 1  2nd set   ABduction Limitations Circles at 90 degrees abduction CW/CCW 1# x10   Other Sidelying Exercises Horizontal Abd to vertical at 90 dg shoulder flexion 1# x10   Shoulder Exercises: Standing   Extension Strengthening;Both;10 reps;Theraband   Theraband Level (Shoulder Extension) Level 3 (Green)   Row Strengthening;Both;10 reps;Theraband  2 sets    Theraband Level (Shoulder Row) Level 3 (Green)   Shoulder Exercises: ROM/Strengthening   UBE (Upper Arm Bike) lvl 1.5 fwd/back 2' each   Modalities   Modalities Vasopneumatic   Vasopneumatic   Number Minutes Vasopneumatic  10 minutes   Vasopnuematic Location  Shoulder   Vasopneumatic Pressure Low   Vasopneumatic Temperature  Lowest   Manual Therapy   Joint Mobilization Grade I-II AP and inferior glides, Grade I-II distraction, STM to Rt pec/bicep   Soft tissue mobilization STM to pec in supine (DTM deferred today secondary to increased soreness)                  PT Short Term Goals - 08/07/15 1758    PT SHORT TERM GOAL #1   Title Independent with initial HEP (08/14/15)   Status Achieved   PT SHORT TERM GOAL #2   Title Patient will demonstrate Right shoudler ROM to within 10 degrees of left shoulder without increased pain (08/14/15)   Status Partially Met  Met for all movements except abduction   PT SHORT TERM GOAL #3   Title Patient will report worst pain in right shoulder no greater than 6/10 (08/14/15)   Status On-going           PT Long Term Goals - 08/07/15 1759    PT LONG TERM GOAL #1   Title Independent with advanced HEP (09/11/15)   Status On-going   PT LONG TERM GOAL #2   Title Patient will demonstrate right shoulder ROM WFL for functional use of right UE during ADLs (09/11/15)   Status On-going   PT LONG TERM GOAL #3   Title Patient will report worst pain in right shoulder no greater than 4/10 (09/11/15)   Status On-going   PT LONG TERM GOAL #4   Title Patient will demonstrate right shoulder strength >/= 4/5 without pain (09/11/15)   Status On-going   PT LONG TERM GOAL #5   Title Patient will report worst pain in low back no greater than 5/10   Status On-going   PT LONG TERM GOAL #6   Title Patient will tolerate standing for at least 20 minutes without increased low back pain to improve abillity to complete household chores (09/11/15)   Status  On-going   PT LONG TERM GOAL #7   Title Patient will report ability to walk at least 1 mile without increased low back pain (09/11/15)   Status On-going               Plan - 08/07/15 1748    Clinical Impression Statement Low back better but shoulder worse after patient completing a Zumba class over weekend, therefore continued focus on right  shoulder pain. Able to tolerate slight progression of exercises today, but deferred progression of standing exercises due to c/o increased pain. Patient demonstrating improving posture with significant improvement noted in right shoulder flexion, IR & ER ROM along with increasing strength. Pain persists primarily with activties/ROM into abduction or ER.   PT Next Visit Plan Continue Rt shoulder focus- posture training, stretches/ROM, strengthening with progression to more standing exercises, manual, modalities.    Consulted and Agree with Plan of Care Patient        Problem List Patient Active Problem List   Diagnosis Date Noted  . Right shoulder pain 07/11/2015  . LUQ pain 04/10/2015  . Vaginal discharge 02/15/2015  . UTI symptoms 01/15/2015  . Bacterial vaginitis 12/24/2014  . Exposure to STD 06/11/2013  . Headache(784.0) 11/12/2012  . Hypokalemia 11/10/2012  . TIA (transient ischemic attack) 11/09/2012  . Nerve compression 05/20/2012  . GERD (gastroesophageal reflux disease)   . Herpes genitalia   . H/O: myomectomy   . Fibroid uterus 03/17/2012  . PULMONARY HYPERTENSION 11/06/2010  . Morbid obesity 12/04/2008  . Essential hypertension 10/22/2007  . HPV 03/24/2007  . LOSS, HEARING NOS 03/24/2007  . ASTHMA 03/24/2007    Percival Spanish, PT, MPT 08/07/2015, 6:10 PM  [facility] [address]  [place], [place], [address] Phone: [phone]   Fax:  [phone]

## 2015-08-09 ENCOUNTER — Ambulatory Visit: Payer: 59 | Admitting: Physical Therapy

## 2015-08-14 ENCOUNTER — Ambulatory Visit: Payer: 59 | Admitting: Rehabilitation

## 2015-08-14 DIAGNOSIS — M25511 Pain in right shoulder: Secondary | ICD-10-CM

## 2015-08-14 DIAGNOSIS — M545 Low back pain, unspecified: Secondary | ICD-10-CM

## 2015-08-14 DIAGNOSIS — M25611 Stiffness of right shoulder, not elsewhere classified: Secondary | ICD-10-CM

## 2015-08-14 DIAGNOSIS — R29898 Other symptoms and signs involving the musculoskeletal system: Secondary | ICD-10-CM

## 2015-08-14 NOTE — Therapy (Signed)
[facility] [address]  [place], [place], [address] Phone: [phone]   Fax:  [phone]  Physical Therapy Treatment  Patient Details  Name: [name] MRN: [id number] Date of Birth: 1971-06-02 Referring Provider:  Dene Gentry, MD  Encounter Date: 08/14/2015      PT End of Session - 08/14/15 0809    Visit Number 8   Number of Visits 16   Date for PT Re-Evaluation 09/11/15   PT Start Time 0805   PT Stop Time 0962   PT Time Calculation (min) 50 min      Past Medical History  Diagnosis Date  . Allergy   . Asthma   . Hypertension     pulmonary  . Herpes genitalia   . H/O: myomectomy   . GERD (gastroesophageal reflux disease)     no meds  . Heart murmur   . TIA (transient ischemic attack)   . Refusal of blood transfusions as patient is Jehovah's Witness   . Hearing impaired     can hear on left side only    Past Surgical History  Procedure Laterality Date  . Myomectomy  2004      2010 robotic  . Ovarian cyst removal  2010    x2  . Breast lumpectomy  01/06/11  . Robotic assisted lap vaginal hysterectomy    . Cystoscopy  05/19/2012    Procedure: CYSTOSCOPY;  Surgeon: Alwyn Pea, MD;  Location: [facility];  Service: Gynecology;  Laterality: N/A;  . Robotic assisted laparoscopic ovarian cystectomy Right 03/20/2015    Procedure: ROBOTIC ASSISTED LAPAROSCOPIC Salpingectomy OVARIAN CYSTECTOMY, pelvic washings, excision of peritoneal pseudocyst.;  Surgeon: Delsa Bern, MD;  Location: [facility];  Service: Gynecology;  Laterality: Right;    There were no vitals filed for this visit.  Visit Diagnosis:  Right shoulder pain  Stiffness of right shoulder joint  Weakness of right arm  Midline low back pain without sciatica      Subjective Assessment - 08/14/15 0807    Subjective Reports her shoulder is normally stiff in the morning and a little sore. Hasn't been back to Zumba but has also marked  improvements in her back pain since that class.    Currently in Pain? Yes   Pain Score 3    Pain Location Shoulder   Pain Orientation Right                         [facility] Adult PT Treatment/Exercise - 08/14/15 0809    Exercises   Exercises Shoulder   Shoulder Exercises: Supine   Protraction Right;Weights;10 reps  2 sets   Protraction Weight (lbs) 3#   Protraction Limitations Serratus punch   Horizontal ABduction Strengthening;Both;10 reps  2 sets   Theraband Level (Shoulder Horizontal ABduction) Level 3 (Green)   Flexion AAROM;Both;Strengthening;Right;15 reps  1st set with bilateral, 2nd set with Rt only   Shoulder Flexion Weight (lbs) 3   ABduction 10 reps;Both;Strengthening  Abduction iso with pullover   Theraband Level (Shoulder ABduction) Level 2 (Red)   Shoulder Exercises: Sidelying   External Rotation AROM;10 reps;Right  2 sets   ABduction AROM;Right;10 reps  2 sets   ABduction Weight (lbs) 1   ABduction Limitations Circles at 90 degrees abduction CW/CCW 1# x10   Other Sidelying Exercises Horizontal Abd to vertical at 90 dg shoulder flexion 1# x10   Shoulder Exercises: Standing   Extension Strengthening;Both;Theraband;15 reps  2 sets   Theraband Level (Shoulder Extension) Level 3 (Green)   Row Strengthening;Both;Theraband;15 reps  2 sets   Theraband Level (Shoulder Row) Level 3 (Green)   Other Standing Exercises Circles with ball on wall x10 CW/CCW   Shoulder Exercises: ROM/Strengthening   UBE (Upper Arm Bike) lvl 2.0 fwd/back 2' each   Modalities   Modalities Vasopneumatic   Vasopneumatic   Number Minutes Vasopneumatic  10 minutes   Vasopnuematic Location  Shoulder   Vasopneumatic Pressure Low   Vasopneumatic Temperature  Lowest   Manual Therapy   Joint Mobilization Grade I-II AP and inferior glides, Grade I-II distraction, STM to Rt pec/bicep   Soft tissue mobilization STM to pec in supine with occational DTM to pt tolerance                   PT Short Term Goals - 08/07/15 1758    PT SHORT TERM GOAL #1   Title Independent with initial HEP (08/14/15)   Status Achieved   PT SHORT TERM GOAL #2   Title Patient will demonstrate Right shoudler ROM to within 10 degrees of left shoulder without increased pain (08/14/15)   Status Partially Met  Met for all movements except abduction   PT SHORT TERM GOAL #3   Title Patient will report worst pain in right shoulder no greater than 6/10 (08/14/15)   Status On-going           PT Long Term Goals - 08/07/15 1759    PT LONG TERM GOAL #1   Title Independent with advanced HEP (09/11/15)   Status On-going   PT LONG TERM GOAL #2   Title Patient will demonstrate right shoulder ROM WFL for functional use of right UE during ADLs (09/11/15)   Status On-going   PT LONG TERM GOAL #3   Title Patient will report worst pain in right shoulder no greater than 4/10 (09/11/15)   Status On-going   PT LONG TERM GOAL #4   Title Patient will demonstrate right shoulder strength >/= 4/5 without pain (09/11/15)   Status On-going   PT LONG TERM GOAL #5   Title Patient will report worst pain in low back no greater than 5/10   Status On-going   PT LONG TERM GOAL #6   Title Patient will tolerate standing for at least 20 minutes without increased low back pain to improve abillity to complete household chores (09/11/15)   Status On-going   PT LONG TERM GOAL #7   Title Patient will report ability to walk at least 1 mile without increased low back pain (09/11/15)   Status On-going               Plan - 08/14/15 0851    Clinical Impression Statement Began standing shoulder stability exercise today with standing ball on wall with good tolerance. Returned to some DTM with Rt pec today.    PT Next Visit Plan Continue Rt shoulder focus- posture training, stretches/ROM, strengthening with progression to more standing exercises, manual, modalities.    Consulted and Agree with Plan of  Care Patient        Problem List Patient Active Problem List   Diagnosis Date Noted  . Right shoulder pain 07/11/2015  . LUQ pain 04/10/2015  . Vaginal discharge 02/15/2015  . UTI symptoms 01/15/2015  . Bacterial vaginitis 12/24/2014  . Exposure to STD 06/11/2013  . Headache(784.0) 11/12/2012  . Hypokalemia 11/10/2012  . TIA (transient ischemic attack) 11/09/2012  . Nerve compression  05/20/2012  . GERD (gastroesophageal reflux disease)   . Herpes genitalia   . H/O: myomectomy   . Fibroid uterus 03/17/2012  . PULMONARY HYPERTENSION 11/06/2010  . Morbid obesity 12/04/2008  . Essential hypertension 10/22/2007  . HPV 03/24/2007  . LOSS, HEARING NOS 03/24/2007  . ASTHMA 03/24/2007    Barbette Hair, PTA 08/14/2015, 8:53 AM  [facility] [address]  [place], [place], [address] Phone: [phone]   Fax:  [phone]

## 2015-08-16 ENCOUNTER — Ambulatory Visit: Payer: 59 | Admitting: Physical Therapy

## 2015-08-16 DIAGNOSIS — M25511 Pain in right shoulder: Secondary | ICD-10-CM

## 2015-08-16 DIAGNOSIS — M25611 Stiffness of right shoulder, not elsewhere classified: Secondary | ICD-10-CM

## 2015-08-16 DIAGNOSIS — R29898 Other symptoms and signs involving the musculoskeletal system: Secondary | ICD-10-CM

## 2015-08-16 NOTE — Therapy (Signed)
[facility] [address]  [place], [place], [address] Phone: [phone]   Fax:  [phone]  Physical Therapy Treatment  Patient Details  Name: [name] MRN: [id number] Date of Birth: 01/19/1971 Referring Provider:  Dene Gentry, MD  Encounter Date: 08/16/2015      PT End of Session - 08/16/15 1713    Visit Number 9   Number of Visits 16   Date for PT Re-Evaluation 09/11/15   PT Start Time 1706   PT Stop Time 1801   PT Time Calculation (min) 55 min   Activity Tolerance Patient tolerated treatment well;Patient limited by pain   Behavior During Therapy [facility] for tasks assessed/performed      Past Medical History  Diagnosis Date  . Allergy   . Asthma   . Hypertension     pulmonary  . Herpes genitalia   . H/O: myomectomy   . GERD (gastroesophageal reflux disease)     no meds  . Heart murmur   . TIA (transient ischemic attack)   . Refusal of blood transfusions as patient is Jehovah's Witness   . Hearing impaired     can hear on left side only    Past Surgical History  Procedure Laterality Date  . Myomectomy  2004      2010 robotic  . Ovarian cyst removal  2010    x2  . Breast lumpectomy  01/06/11  . Robotic assisted lap vaginal hysterectomy    . Cystoscopy  05/19/2012    Procedure: CYSTOSCOPY;  Surgeon: Alwyn Pea, MD;  Location: [facility];  Service: Gynecology;  Laterality: N/A;  . Robotic assisted laparoscopic ovarian cystectomy Right 03/20/2015    Procedure: ROBOTIC ASSISTED LAPAROSCOPIC Salpingectomy OVARIAN CYSTECTOMY, pelvic washings, excision of peritoneal pseudocyst.;  Surgeon: Delsa Bern, MD;  Location: [facility];  Service: Gynecology;  Laterality: Right;    There were no vitals filed for this visit.  Visit Diagnosis:  Right shoulder pain  Stiffness of right shoulder joint  Weakness of right arm      Subjective Assessment - 08/16/15 1710    Subjective Patient noting less  stiffness this morning and denies pain. States she had a small twinge in her back yesterday, but changed her shoes and it went away.   Currently in Pain? No/denies                         [facility] Adult PT Treatment/Exercise - 08/16/15 1706    Exercises   Exercises Shoulder   Shoulder Exercises: Supine   Protraction Right;Weights;10 reps  2 sets   Protraction Weight (lbs) 3   Protraction Limitations Serratus punch   Horizontal ABduction Strengthening;Both;10 reps  2 sets   Theraband Level (Shoulder Horizontal ABduction) Level 3 (Green)   Shoulder Exercises: Sidelying   External Rotation AROM;Right;10 reps;Weights   External Rotation Weight (lbs) 1   ABduction AROM;Right;10 reps;Weights  2 sets   ABduction Weight (lbs) 1   Shoulder Exercises: Standing   Flexion Both;10 reps;Weights   Shoulder Flexion Weight (lbs) 1   Flexion Limitations standing against 1/2 foam roll on wall   ABduction Both;10 reps;Weights   Shoulder ABduction Weight (lbs) 1   ABduction Limitations standing against 1/2 foam roll on wall   Extension Strengthening;Both;10 reps;Theraband  2 sets   Theraband Level (Shoulder Extension) Level 4 (Blue)   Row Strengthening;Both;10 reps;Theraband  2 sets   Theraband Level (Shoulder Row)  Level 4 (Blue)   Other Standing Exercises Circles with ball on wall in 90 dg flexion and 90 dg abduction x10 CW/CCW   Shoulder Exercises: Therapy Ball   Flexion 10 reps   Flexion Limitations ball on wall   ABduction 10 reps   ABduction Limitations roll out in sitting   Shoulder Exercises: ROM/Strengthening   UBE (Upper Arm Bike) lvl 2.0 fwd/back 2' each   Cybex Row 10 reps   Modalities   Modalities Vasopneumatic   Vasopneumatic   Number Minutes Vasopneumatic  10 minutes   Vasopnuematic Location  Shoulder   Vasopneumatic Pressure Low   Vasopneumatic Temperature  Lowest                  PT Short Term Goals - 08/16/15 1812    PT SHORT TERM GOAL #1    Title Independent with initial HEP (08/14/15)   PT SHORT TERM GOAL #2   Title Patient will demonstrate Right shoudler ROM to within 10 degrees of left shoulder without increased pain (08/14/15)   Status Partially Met   PT SHORT TERM GOAL #3   Title Patient will report worst pain in right shoulder no greater than 6/10 (08/14/15)   Status Achieved           PT Long Term Goals - 08/16/15 1812    PT LONG TERM GOAL #1   Title Independent with advanced HEP (09/11/15)   Status On-going   PT LONG TERM GOAL #2   Title Patient will demonstrate right shoulder ROM WFL for functional use of right UE during ADLs (09/11/15)   Status On-going   PT LONG TERM GOAL #3   Title Patient will report worst pain in right shoulder no greater than 4/10 (09/11/15)   Status On-going   PT LONG TERM GOAL #4   Title Patient will demonstrate right shoulder strength >/= 4/5 without pain (09/11/15)   Status On-going   PT LONG TERM GOAL #5   Title Patient will report worst pain in low back no greater than 5/10   Status On-going   PT LONG TERM GOAL #6   Title Patient will tolerate standing for at least 20 minutes without increased low back pain to improve abillity to complete household chores (09/11/15)   Status On-going   PT LONG TERM GOAL #7   Title Patient will report ability to walk at least 1 mile without increased low back pain (09/11/15)   Status On-going               Plan - 08/16/15 1807    Clinical Impression Statement Patient reporting reduced pain and morning stiffness, therefore continued progression with standing ROM, strengthening and stability exercises but patient demonstrating limited tolerance for some exercises due to pain (theraband horiz abd) but able to complete exercises in supine without difficulty. Will plan to reassess ROM and strength next week.   PT Next Visit Plan Continue Rt shoulder focus- posture training, stretches/ROM, strengthening with progression to more standing  exercises, manual, modalities.    Consulted and Agree with Plan of Care Patient        Problem List Patient Active Problem List   Diagnosis Date Noted  . Right shoulder pain 07/11/2015  . LUQ pain 04/10/2015  . Vaginal discharge 02/15/2015  . UTI symptoms 01/15/2015  . Bacterial vaginitis 12/24/2014  . Exposure to STD 06/11/2013  . Headache(784.0) 11/12/2012  . Hypokalemia 11/10/2012  . TIA (transient ischemic attack) 11/09/2012  . Nerve compression 05/20/2012  .  GERD (gastroesophageal reflux disease)   . Herpes genitalia   . H/O: myomectomy   . Fibroid uterus 03/17/2012  . PULMONARY HYPERTENSION 11/06/2010  . Morbid obesity 12/04/2008  . Essential hypertension 10/22/2007  . HPV 03/24/2007  . LOSS, HEARING NOS 03/24/2007  . ASTHMA 03/24/2007    Percival Spanish, PT, MPT 08/16/2015, 6:13 PM  [facility] [address]  [place], [place], [address] Phone: [phone]   Fax:  [phone]

## 2015-08-20 ENCOUNTER — Encounter: Payer: Self-pay | Admitting: Family Medicine

## 2015-08-20 ENCOUNTER — Ambulatory Visit (INDEPENDENT_AMBULATORY_CARE_PROVIDER_SITE_OTHER): Payer: 59 | Admitting: Family Medicine

## 2015-08-20 VITALS — BP 109/59 | HR 76 | Temp 98.4°F | Ht 63.0 in | Wt 197.6 lb

## 2015-08-20 DIAGNOSIS — D229 Melanocytic nevi, unspecified: Secondary | ICD-10-CM

## 2015-08-20 DIAGNOSIS — Z202 Contact with and (suspected) exposure to infections with a predominantly sexual mode of transmission: Secondary | ICD-10-CM

## 2015-08-20 LAB — RPR

## 2015-08-20 NOTE — Patient Instructions (Signed)
Moles Moles are usually harmless growths on the skin. They are accumulations of color (pigment) cells in the skin that:   Can be various colors, from light brown to black.  Can appear anywhere on the body.  May remain flat or become raised.  May contain hairs.  May remain smooth or develop wrinkling. Most moles are not cancerous (benign). However, some moles may develop changes and become cancerous. It is important to check your moles every month. If you check your moles regularly, you will be able to notice any changes that may occur.  CAUSES  Moles occur when skin cells grow together in clusters instead of spreading out in the skin as they normally do. The reason for this clustering is unknown. DIAGNOSIS  Your caregiver will perform a skin examination to diagnose your mole.  TREATMENT  Moles usually do not require treatment. If a mole becomes worrisome, your caregiver may choose to take a sample of the mole or remove it entirely, and then send it to a lab for examination.  HOME CARE INSTRUCTIONS  Check your mole(s) monthly for changes that may indicate skin cancer. These changes can include:  A change in size.  A change in color. Note that moles tend to darken during pregnancy or when taking birth control pills (oral contraception).  A change in shape.  A change in the border of the mole.  Wear sunscreen (with an SPF of at least 30) when you spend long periods of time outside. Reapply the sunscreen every 2-3 hours.  Schedule annual appointments with your skin doctor (dermatologist) if you have a large number of moles. SEEK MEDICAL CARE IF:  Your mole changes size, especially if it becomes larger than a pencil eraser.  Your mole changes in color or develops more than one color.  Your mole becomes itchy or bleeds.  Your mole, or the skin near the mole, becomes painful, sore, red, or swollen.  Your mole becomes scaly, sheds skin, or oozes fluid.  Your mole develops  irregular borders.  Your mole becomes flat or develops raised areas.  Your mole becomes hard or soft. Document Released: 08/05/2001 Document Revised: 08/04/2012 Document Reviewed: 05/24/2012 ExitCare Patient Information 2015 [employer]. This information is not intended to replace advice given to you by your health care provider. Make sure you discuss any questions you have with your health care provider.  

## 2015-08-20 NOTE — Progress Notes (Signed)
Patient ID: [name], female    DOB: 1971-01-07  Age: 44 y.o. MRN: [id number]    Subjective:  Subjective HPI [name] presents for mole check.  She has a few new ones and one that is changing  She would also like std testing-- no symptoms-- her ob did gc and chlamydia.    Review of Systems  Constitutional: Negative for diaphoresis, appetite change, fatigue and unexpected weight change.  Eyes: Negative for pain, redness and visual disturbance.  Respiratory: Negative for cough, chest tightness, shortness of breath and wheezing.   Cardiovascular: Negative for chest pain, palpitations and leg swelling.  Endocrine: Negative for cold intolerance, heat intolerance, polydipsia, polyphagia and polyuria.  Genitourinary: Negative for dysuria, frequency and difficulty urinating.  Skin:       Changing and new moles on chest  Neurological: Negative for dizziness, light-headedness, numbness and headaches.    History Past Medical History  Diagnosis Date  . Allergy   . Asthma   . Hypertension     pulmonary  . Herpes genitalia   . H/O: myomectomy   . GERD (gastroesophageal reflux disease)     no meds  . Heart murmur   . TIA (transient ischemic attack)   . Refusal of blood transfusions as patient is Jehovah's Witness   . Hearing impaired     can hear on left side only    She has past surgical history that includes Myomectomy (2004  ); Ovarian cyst removal (2010); Breast lumpectomy (01/06/11); Robotic assisted lap vaginal hysterectomy; Cystoscopy (05/19/2012); and Robotic assisted laparoscopic ovarian cystectomy (Right, 03/20/2015).   Her family history includes Arthritis in her mother; Asthma in her mother; Cancer (age of onset: 54) in her other; Cancer (age of onset: 67) in her maternal grandmother; Diabetes in her brother; Heart disease in her maternal grandmother; Hypertension in her father; Migraines in her sister.She reports that she has never smoked. She has never used smokeless  tobacco. She reports that she does not drink alcohol or use illicit drugs.  Current Outpatient Prescriptions on File Prior to Visit  Medication Sig Dispense Refill  . albuterol (PROVENTIL HFA;VENTOLIN HFA) 108 (90 BASE) MCG/ACT inhaler Inhale 2 puffs into the lungs every 6 (six) hours as needed for wheezing. 1 Inhaler 2  . Ascorbic Acid (VITAMIN C) 1000 MG tablet Take 1,000 mg by mouth daily.    Marland Kitchen aspirin 325 MG tablet Take 1 tablet (325 mg total) by mouth daily.    . cetirizine (ZYRTEC) 10 MG tablet Take 10 mg by mouth daily.    . cholecalciferol (VITAMIN D) 1000 UNITS tablet Take 1,000 Units by mouth daily.    . cyclobenzaprine (FLEXERIL) 10 MG tablet Take 1 tablet (10 mg total) by mouth 3 (three) times daily as needed for muscle spasms. 30 tablet 0  . ibuprofen (ADVIL,MOTRIN) 600 MG tablet Take 600 mg by mouth every 6 (six) hours as needed.    Marland Kitchen lisinopril (PRINIVIL,ZESTRIL) 20 MG tablet Take 1 tablet (20 mg total) by mouth daily. 90 tablet 3  . meloxicam (MOBIC) 15 MG tablet Take 1 tablet (15 mg total) by mouth daily. 30 tablet 0  . metoprolol (LOPRESSOR) 100 MG tablet Take 1 tablet (100 mg total) by mouth 2 (two) times daily. 180 tablet 3  . Multiple Vitamins-Calcium (ONE-A-DAY WOMENS PO) Take 1 tablet by mouth daily.    Marland Kitchen spironolactone (ALDACTONE) 25 MG tablet Take 1 tablet (25 mg total) by mouth daily. 90 tablet 3  . valACYclovir (VALTREX)  500 MG tablet Take 500 mg by mouth daily.      No current facility-administered medications on file prior to visit.     Objective:  Objective Physical Exam  Constitutional: She appears well-developed and well-nourished.  Skin:     Psychiatric: She has a normal mood and affect. Her behavior is normal. Judgment and thought content normal.  Nursing note and vitals reviewed.  BP 109/59 mmHg  Pulse 76  Temp(Src) 98.4 F (36.9 C) (Oral)  Ht 5\' 3"  (1.6 m)  Wt 197 lb 9.6 oz (89.631 kg)  BMI 35.01 kg/m2  SpO2 100%  LMP 04/18/2012 Wt Readings  from Last 3 Encounters:  08/20/15 197 lb 9.6 oz (89.631 kg)  07/10/15 192 lb (87.091 kg)  07/03/15 197 lb 9.6 oz (89.631 kg)     Lab Results  Component Value Date   WBC 9.9 04/02/2015   HGB 12.8 04/02/2015   HCT 38.4 04/02/2015   PLT 313 04/02/2015   GLUCOSE 98 04/02/2015   CHOL 168 03/06/2015   TRIG 79.0 03/06/2015   HDL 60.10 03/06/2015   LDLCALC 92 03/06/2015   ALT 18 04/02/2015   AST 17 04/02/2015   NA 139 04/02/2015   K 4.0 04/02/2015   CL 106 04/02/2015   CREATININE 0.79 04/02/2015   BUN 8 04/02/2015   CO2 27 04/02/2015   TSH 0.56 03/06/2015   INR 1.11 11/09/2012   HGBA1C 5.1 11/10/2012    Dg Lumbar Spine Complete  07/05/2015   CLINICAL DATA:  Low back pain since prolonged pelvic surgery for hysterectomy 3 years ago  EXAM: LUMBAR SPINE - COMPLETE 4+ VIEW  COMPARISON:  Abdominal pelvic CT scan of Apr 20, 2015  FINDINGS: The lumbar vertebral bodies are preserved in height. There is mild narrowing of the L3-4 disc space. There is no spondylolisthesis. There is mild facet joint hypertrophy at L4-5 and at L5-S1. The pedicles and transverse processes are intact. The observed portions of the sacrum are normal. An 35mm radiodensity on the right likely reflects retained barium within a colonic diverticulum.  IMPRESSION: Mild degenerative disc space narrowing at L3-4, and mild facet joint hypertrophy at L4-5 and at L5-S1. There is no acute bony abnormality.   Electronically Signed   By: David  Martinique M.D.   On: 07/05/2015 08:26     Assessment & Plan:  Plan I have discontinued Ms. Richins's nitroGLYCERIN. I am also having her maintain her Multiple Vitamins-Calcium (ONE-A-DAY WOMENS PO), aspirin, valACYclovir, albuterol, vitamin C, lisinopril, metoprolol, spironolactone, cetirizine, cholecalciferol, ibuprofen, meloxicam, and cyclobenzaprine.  No orders of the defined types were placed in this encounter.    Problem List Items Addressed This Visit    None    Visit Diagnoses     Suspicious nevus    -  Primary    Relevant Orders    Ambulatory referral to Dermatology    STD exposure        Relevant Orders    HIV antibody    RPR    Hepatitis C antibody       Follow-up: Return if symptoms worsen or fail to improve.  Garnet Koyanagi, DO

## 2015-08-20 NOTE — Progress Notes (Signed)
Pre visit review using our clinic review tool, if applicable. No additional management support is needed unless otherwise documented below in the visit note. 

## 2015-08-21 ENCOUNTER — Ambulatory Visit: Payer: 59 | Admitting: Rehabilitation

## 2015-08-21 ENCOUNTER — Ambulatory Visit (INDEPENDENT_AMBULATORY_CARE_PROVIDER_SITE_OTHER): Payer: 59 | Admitting: Family Medicine

## 2015-08-21 ENCOUNTER — Encounter: Payer: Self-pay | Admitting: Family Medicine

## 2015-08-21 VITALS — BP 128/85 | HR 76 | Ht 63.0 in | Wt 197.0 lb

## 2015-08-21 DIAGNOSIS — M545 Low back pain, unspecified: Secondary | ICD-10-CM

## 2015-08-21 DIAGNOSIS — M25511 Pain in right shoulder: Secondary | ICD-10-CM | POA: Diagnosis not present

## 2015-08-21 DIAGNOSIS — M25611 Stiffness of right shoulder, not elsewhere classified: Secondary | ICD-10-CM

## 2015-08-21 DIAGNOSIS — R29898 Other symptoms and signs involving the musculoskeletal system: Secondary | ICD-10-CM

## 2015-08-21 LAB — HIV ANTIBODY (ROUTINE TESTING W REFLEX): HIV: NONREACTIVE

## 2015-08-21 LAB — HEPATITIS C ANTIBODY: HCV Ab: NEGATIVE

## 2015-08-21 MED ORDER — NITROGLYCERIN 0.2 MG/HR TD PT24: MEDICATED_PATCH | TRANSDERMAL | Status: DC

## 2015-08-21 NOTE — Patient Instructions (Signed)
You have rotator cuff impingement Try to avoid painful activities (overhead activities, lifting with extended arm) as much as possible. Consider aleve 2 tabs twice a day with food OR ibuprofen 3 tabs three times a day with food for pain and inflammation. Can take tylenol in addition to this. Continue physical therapy with transition to home exercise program. Do home exercise program with theraband and scapular stabilization exercises daily - these are very important for long term relief even if an injection was given. Nitro patches 1/4th patch over affected shoulder, change daily. If not improving at follow-up we will consider further imaging, injection. Follow up with me in 6 weeks.

## 2015-08-21 NOTE — Therapy (Signed)
[facility] [address]  [place], [place], [address] Phone: [phone]   Fax:  [phone]  Physical Therapy Treatment  Patient Details  Name: [name] MRN: [id number] Date of Birth: [birth year] Referring Provider:  Dene Gentry, MD  Encounter Date: 08/21/2015      PT End of Session - 08/21/15 1702    Visit Number 10   Number of Visits 16   Date for PT Re-Evaluation 09/11/15   PT Start Time 1702   PT Stop Time 1750   PT Time Calculation (min) 48 min   Activity Tolerance Patient tolerated treatment well   Behavior During Therapy [facility] for tasks assessed/performed      Past Medical History  Diagnosis Date  . Allergy   . Asthma   . Hypertension     pulmonary  . Herpes genitalia   . H/O: myomectomy   . GERD (gastroesophageal reflux disease)     no meds  . Heart murmur   . TIA (transient ischemic attack)   . Refusal of blood transfusions as patient is Jehovah's Witness   . Hearing impaired     can hear on left side only    Past Surgical History  Procedure Laterality Date  . Myomectomy  2004      2010 robotic  . Ovarian cyst removal  2010    x2  . Breast lumpectomy  01/06/11  . Robotic assisted lap vaginal hysterectomy    . Cystoscopy  05/19/2012    Procedure: CYSTOSCOPY;  Surgeon: Alwyn Pea, MD;  Location: [facility];  Service: Gynecology;  Laterality: N/A;  . Robotic assisted laparoscopic ovarian cystectomy Right 03/20/2015    Procedure: ROBOTIC ASSISTED LAPAROSCOPIC Salpingectomy OVARIAN CYSTECTOMY, pelvic washings, excision of peritoneal pseudocyst.;  Surgeon: Delsa Bern, MD;  Location: [facility];  Service: Gynecology;  Laterality: Right;    There were no vitals filed for this visit.  Visit Diagnosis:  Right shoulder pain  Stiffness of right shoulder joint  Weakness of right arm  Midline low back pain without sciatica      Subjective Assessment - 08/21/15 1703    Subjective  Saw MD and he said to continue with PT and to return to him in 6 weeks. Spend most of the afternoon cleaning her house and states her shoulder was 3/10 when she started but is now a 6/10. Also states she think she over did it and states her shoulder is really sore/fatigued.    Currently in Pain? Yes   Pain Score 6    Pain Location Shoulder   Pain Orientation Right            [facility] PT Assessment - 08/21/15 1706    Observation/Other Assessments   Focus on Therapeutic Outcomes (FOTO)  Shoulder: 66% (34% limitation)   AROM   AROM Assessment Site Shoulder   Right/Left Shoulder Right   Right Shoulder Flexion 150 Degrees   Right Shoulder ABduction 135 Degrees   Strength   Strength Assessment Site Shoulder   Right/Left Shoulder Right   Right Shoulder Flexion 4-/5   Right Shoulder ABduction 4-/5   Right Shoulder Internal Rotation 4-/5   Right Shoulder External Rotation 4-/5                     [facility] Adult PT Treatment/Exercise - 08/21/15 1705    Exercises   Exercises Shoulder   Shoulder Exercises: Supine   Protraction Right;Weights;5 reps  Protraction Weight (lbs) 3   Protraction Limitations Attempted but unable to complete due to complete due to fatigue   Shoulder Exercises: ROM/Strengthening   UBE (Upper Arm Bike) lvl 2.0 fwd/back 2' each   Modalities   Modalities Vasopneumatic   Vasopneumatic   Number Minutes Vasopneumatic  10 minutes   Vasopnuematic Location  Shoulder   Vasopneumatic Pressure Low   Vasopneumatic Temperature  Lowest   Manual Therapy   Manual Therapy Passive ROM   Joint Mobilization Grade I-II AP and inferior glides, Grade I-II distraction   Soft tissue mobilization STM to pec in supine with occational DTM to pt tolerance   Passive ROM Into Flexion, IR, ER to tolerance                  PT Short Term Goals - 08/16/15 1812    PT SHORT TERM GOAL #1   Title Independent with initial HEP (08/14/15)   PT SHORT TERM GOAL #2   Title  Patient will demonstrate Right shoudler ROM to within 10 degrees of left shoulder without increased pain (08/14/15)   Status Partially Met   PT SHORT TERM GOAL #3   Title Patient will report worst pain in right shoulder no greater than 6/10 (08/14/15)   Status Achieved           PT Long Term Goals - 08/16/15 1812    PT LONG TERM GOAL #1   Title Independent with advanced HEP (09/11/15)   Status On-going   PT LONG TERM GOAL #2   Title Patient will demonstrate right shoulder ROM WFL for functional use of right UE during ADLs (09/11/15)   Status On-going   PT LONG TERM GOAL #3   Title Patient will report worst pain in right shoulder no greater than 4/10 (09/11/15)   Status On-going   PT LONG TERM GOAL #4   Title Patient will demonstrate right shoulder strength >/= 4/5 without pain (09/11/15)   Status On-going   PT LONG TERM GOAL #5   Title Patient will report worst pain in low back no greater than 5/10   Status On-going   PT LONG TERM GOAL #6   Title Patient will tolerate standing for at least 20 minutes without increased low back pain to improve abillity to complete household chores (09/11/15)   Status On-going   PT LONG TERM GOAL #7   Title Patient will report ability to walk at least 1 mile without increased low back pain (09/11/15)   Status On-going               Plan - 08/21/15 1740    Clinical Impression Statement Focused on manual today due to high pain levels and fatigue with exercises. Tight and tender pecs today in addition to tender lateral shoulder. Will return to exercise as pt is able. Worse measurements today most likely due to high pain and pt reporting shoulder fatigue.    PT Next Visit Plan Continue Rt shoulder focus- posture training, stretches/ROM, strengthening with progression to more standing exercises, manual, modalities.    Consulted and Agree with Plan of Care Patient        Problem List Patient Active Problem List   Diagnosis Date Noted  .  Right shoulder pain 07/11/2015  . LUQ pain 04/10/2015  . Vaginal discharge 02/15/2015  . UTI symptoms 01/15/2015  . Bacterial vaginitis 12/24/2014  . Exposure to STD 06/11/2013  . Headache(784.0) 11/12/2012  . Hypokalemia 11/10/2012  . TIA (transient ischemic attack) 11/09/2012  .  Nerve compression 05/20/2012  . GERD (gastroesophageal reflux disease)   . Herpes genitalia   . H/O: myomectomy   . Fibroid uterus 03/17/2012  . PULMONARY HYPERTENSION 11/06/2010  . Morbid obesity 12/04/2008  . Essential hypertension 10/22/2007  . HPV 03/24/2007  . LOSS, HEARING NOS 03/24/2007  . ASTHMA 03/24/2007    Barbette Hair, PTA 08/21/2015, 5:47 PM  [facility] [address]  [place], [place], [address] Phone: [phone]   Fax:  [phone]

## 2015-08-22 NOTE — Progress Notes (Signed)
PCP and referred by: Garnet Koyanagi, DO  Subjective:   HPI: Patient is a 44 y.o. female here for right shoulder pain.  8/16: Patient denies known injury or trauma. Reports pain has been ongoing since march, worse recently. Difficulty raising arm overhead, lifting purse, dressing self. Tried stretching. Cannot sleep on side. Right handed.  9/27: Patient reports she feels about 65% improved from last visit. Doing PT about twice a week. No night pain. Not needing to take medicine though has ibuprofen if she needs to. Doing home exercises as well. Pain level 3/10.  Past Medical History  Diagnosis Date  . Allergy   . Asthma   . Hypertension     pulmonary  . Herpes genitalia   . H/O: myomectomy   . GERD (gastroesophageal reflux disease)     no meds  . Heart murmur   . TIA (transient ischemic attack)   . Refusal of blood transfusions as patient is Jehovah's Witness   . Hearing impaired     can hear on left side only    Current Outpatient Prescriptions on File Prior to Visit  Medication Sig Dispense Refill  . albuterol (PROVENTIL HFA;VENTOLIN HFA) 108 (90 BASE) MCG/ACT inhaler Inhale 2 puffs into the lungs every 6 (six) hours as needed for wheezing. 1 Inhaler 2  . Ascorbic Acid (VITAMIN C) 1000 MG tablet Take 1,000 mg by mouth daily.    Marland Kitchen aspirin 325 MG tablet Take 1 tablet (325 mg total) by mouth daily.    . cetirizine (ZYRTEC) 10 MG tablet Take 10 mg by mouth daily.    . cholecalciferol (VITAMIN D) 1000 UNITS tablet Take 1,000 Units by mouth daily.    . cyclobenzaprine (FLEXERIL) 10 MG tablet Take 1 tablet (10 mg total) by mouth 3 (three) times daily as needed for muscle spasms. 30 tablet 0  . ibuprofen (ADVIL,MOTRIN) 600 MG tablet Take 600 mg by mouth every 6 (six) hours as needed.    Marland Kitchen lisinopril (PRINIVIL,ZESTRIL) 20 MG tablet Take 1 tablet (20 mg total) by mouth daily. 90 tablet 3  . meloxicam (MOBIC) 15 MG tablet Take 1 tablet (15 mg total) by mouth daily. 30 tablet 0   . metoprolol (LOPRESSOR) 100 MG tablet Take 1 tablet (100 mg total) by mouth 2 (two) times daily. 180 tablet 3  . Multiple Vitamins-Calcium (ONE-A-DAY WOMENS PO) Take 1 tablet by mouth daily.    Marland Kitchen spironolactone (ALDACTONE) 25 MG tablet Take 1 tablet (25 mg total) by mouth daily. 90 tablet 3  . valACYclovir (VALTREX) 500 MG tablet Take 500 mg by mouth daily.      No current facility-administered medications on file prior to visit.    Past Surgical History  Procedure Laterality Date  . Myomectomy  2004      2010 robotic  . Ovarian cyst removal  2010    x2  . Breast lumpectomy  01/06/11  . Robotic assisted lap vaginal hysterectomy    . Cystoscopy  05/19/2012    Procedure: CYSTOSCOPY;  Surgeon: Alwyn Pea, MD;  Location: [facility];  Service: Gynecology;  Laterality: N/A;  . Robotic assisted laparoscopic ovarian cystectomy Right 03/20/2015    Procedure: ROBOTIC ASSISTED LAPAROSCOPIC Salpingectomy OVARIAN CYSTECTOMY, pelvic washings, excision of peritoneal pseudocyst.;  Surgeon: Delsa Bern, MD;  Location: [facility];  Service: Gynecology;  Laterality: Right;    No Known Allergies  Social History   Social History  . Marital Status: Single    Spouse Name: N/A  . Number of  Children: N/A  . Years of Education: N/A   Occupational History  . Not on file.   Social History Main Topics  . Smoking status: Never Smoker   . Smokeless tobacco: Never Used  . Alcohol Use: No  . Drug Use: No  . Sexual Activity:    Partners: Male    Birth Control/ Protection: Surgical     Comment: hyst   Other Topics Concern  . Not on file   Social History Narrative   Exercise---walk, zumba, [employer] video    Family History  Problem Relation Age of Onset  . Asthma Mother   . Arthritis Mother   . Hypertension Father   . Heart disease Maternal Grandmother     PCI  . Cancer Maternal Grandmother 84    breast  . Cancer Other 60    breast and possible colon  . Diabetes Brother   . Migraines  Sister     BP 128/85 mmHg  Pulse 76  Ht 5\' 3"  (1.6 m)  Wt 197 lb (89.359 kg)  BMI 34.91 kg/m2  LMP 04/18/2012  Review of Systems: See HPI above.    Objective:  Physical Exam:  Gen: NAD  Right shoulder: No swelling, ecchymoses.  No gross deformity. No TTP. FROM with painful arc. Negative Hawkins, Neers. Negative Speeds, Yergasons. Strength 5/5 with empty can and resisted internal/external rotation.  Mild pain empty can. Negative apprehension. NV intact distally.    Assessment & Plan:  1. Right shoulder pain - 2/2 rotator cuff impingement.  Clinically improving with PT, home exercises.  Start nitro patches (pharmacy said they would cost around 1000 dollars, checked with our pharmacy and she has a 0 dollar copay to get them).  F/u in 6 weeks.  Consider further imaging, injection if doesn't continue to improve.

## 2015-08-22 NOTE — Assessment & Plan Note (Signed)
2/2 rotator cuff impingement.  Clinically improving with PT, home exercises.  Start nitro patches (pharmacy said they would cost around 1000 dollars, checked with our pharmacy and she has a 0 dollar copay to get them).  F/u in 6 weeks.  Consider further imaging, injection if doesn't continue to improve.

## 2015-08-23 ENCOUNTER — Ambulatory Visit: Payer: 59 | Admitting: Physical Therapy

## 2015-08-23 DIAGNOSIS — M25511 Pain in right shoulder: Secondary | ICD-10-CM | POA: Diagnosis not present

## 2015-08-23 DIAGNOSIS — M25611 Stiffness of right shoulder, not elsewhere classified: Secondary | ICD-10-CM

## 2015-08-23 DIAGNOSIS — R29898 Other symptoms and signs involving the musculoskeletal system: Secondary | ICD-10-CM

## 2015-08-23 NOTE — Therapy (Signed)
[facility] [address]  [place], [place], [address] Phone: [phone]   Fax:  [phone]  Physical Therapy Treatment  Patient Details  Name: [name] MRN: [id number] Date of Birth: 1971-08-27 Referring Provider:  Dene Gentry, MD  Encounter Date: 08/23/2015      PT End of Session - 08/23/15 1710    Visit Number 11   Number of Visits 16   Date for PT Re-Evaluation 09/11/15   PT Start Time 1702   PT Stop Time 6144   PT Time Calculation (min) 47 min   Activity Tolerance Patient limited by pain   Behavior During Therapy [facility] for tasks assessed/performed      Past Medical History  Diagnosis Date  . Allergy   . Asthma   . Hypertension     pulmonary  . Herpes genitalia   . H/O: myomectomy   . GERD (gastroesophageal reflux disease)     no meds  . Heart murmur   . TIA (transient ischemic attack)   . Refusal of blood transfusions as patient is Jehovah's Witness   . Hearing impaired     can hear on left side only    Past Surgical History  Procedure Laterality Date  . Myomectomy  2004      2010 robotic  . Ovarian cyst removal  2010    x2  . Breast lumpectomy  01/06/11  . Robotic assisted lap vaginal hysterectomy    . Cystoscopy  05/19/2012    Procedure: CYSTOSCOPY;  Surgeon: Alwyn Pea, MD;  Location: [facility];  Service: Gynecology;  Laterality: N/A;  . Robotic assisted laparoscopic ovarian cystectomy Right 03/20/2015    Procedure: ROBOTIC ASSISTED LAPAROSCOPIC Salpingectomy OVARIAN CYSTECTOMY, pelvic washings, excision of peritoneal pseudocyst.;  Surgeon: Delsa Bern, MD;  Location: [facility];  Service: Gynecology;  Laterality: Right;    There were no vitals filed for this visit.  Visit Diagnosis:  Right shoulder pain  Stiffness of right shoulder joint  Weakness of right arm      Subjective Assessment - 08/23/15 1709    Subjective Patient reports shoudler still irritated from  overdoing it with cleaning her house earlier this week. States back pain is the better of the two, but still a 5/10.   Currently in Pain? Yes   Pain Score 8    Pain Location Shoulder   Pain Orientation Right                  [facility] Adult PT Treatment/Exercise - 08/23/15 1702    Exercises   Exercises Shoulder   Shoulder Exercises: ROM/Strengthening   UBE (Upper Arm Bike) lvl 1.0 fwd/back 2' each   Modalities   Modalities Vasopneumatic;Electrical Stimulation   Electrical Stimulation   Electrical Stimulation Location Right lateral shoulder   Electrical Stimulation Action IFC   Electrical Stimulation Parameters 40% scan, 80-150 Hz   Electrical Stimulation Goals Pain   Vasopneumatic   Number Minutes Vasopneumatic  15 minutes   Vasopnuematic Location  Shoulder   Vasopneumatic Pressure Low   Vasopneumatic Temperature  Lowest   Manual Therapy   Manual Therapy Passive ROM   Joint Mobilization Grade I-II AP and inferior glides, Grade I-II distraction   Soft tissue mobilization STM to pec in supine and sidelying with occasional DTM to pt tolerance   Passive ROM Into Flexion, IR, ER to tolerance  PT Short Term Goals - 08/23/15 1746    PT SHORT TERM GOAL #1   Title Independent with initial HEP (08/14/15)   Status Achieved   PT SHORT TERM GOAL #2   Title Patient will demonstrate Right shoudler ROM to within 10 degrees of left shoulder without increased pain (08/14/15)   Status Partially Met   PT SHORT TERM GOAL #3   Title Patient will report worst pain in right shoulder no greater than 6/10 (08/14/15)   Status Achieved           PT Long Term Goals - 08/23/15 1746    PT LONG TERM GOAL #1   Title Independent with advanced HEP (09/11/15)   Status On-going   PT LONG TERM GOAL #2   Title Patient will demonstrate right shoulder ROM WFL for functional use of right UE during ADLs (09/11/15)   Status On-going   PT LONG TERM GOAL #3   Title Patient will  report worst pain in right shoulder no greater than 4/10 (09/11/15)   Status On-going   PT LONG TERM GOAL #4   Title Patient will demonstrate right shoulder strength >/= 4/5 without pain (09/11/15)   Status On-going   PT LONG TERM GOAL #5   Title Patient will report worst pain in low back no greater than 5/10   Status On-going   PT LONG TERM GOAL #6   Title Patient will tolerate standing for at least 20 minutes without increased low back pain to improve abillity to complete household chores (09/11/15)   Status On-going   PT LONG TERM GOAL #7   Title Patient will report ability to walk at least 1 mile without increased low back pain (09/11/15)   Status On-going               Plan - 08/23/15 1739    Clinical Impression Statement Patient's right shoulder remains severely irritated with continued tightness and increased tension in pecs along with pain/tenderness in lateral shoulder resulting in poor tolerance for exercises today, therefore continued focus on STM and manual therapy today and added estim for pain control during cryotherapy. Patient reporting pain better after estim/cryotherapy. Plan to resume ROM/posture/strengthening exercises as pain subsides.   PT Next Visit Plan Continue Rt shoulder focus as pain allows - posture training, stretches/ROM, strengthening with progression to more standing exercises, manual, modalities.    Consulted and Agree with Plan of Care Patient        Problem List Patient Active Problem List   Diagnosis Date Noted  . Right shoulder pain 07/11/2015  . LUQ pain 04/10/2015  . Vaginal discharge 02/15/2015  . UTI symptoms 01/15/2015  . Bacterial vaginitis 12/24/2014  . Exposure to STD 06/11/2013  . Headache(784.0) 11/12/2012  . Hypokalemia 11/10/2012  . TIA (transient ischemic attack) 11/09/2012  . Nerve compression 05/20/2012  . GERD (gastroesophageal reflux disease)   . Herpes genitalia   . H/O: myomectomy   . Fibroid uterus 03/17/2012   . PULMONARY HYPERTENSION 11/06/2010  . Morbid obesity 12/04/2008  . Essential hypertension 10/22/2007  . HPV 03/24/2007  . LOSS, HEARING NOS 03/24/2007  . ASTHMA 03/24/2007    Percival Spanish, PT, MPT 08/23/2015, 5:55 PM  [facility] [address]  [place], [place], [address] Phone: [phone]   Fax:  [phone]

## 2015-08-28 ENCOUNTER — Ambulatory Visit: Payer: 59 | Attending: Family Medicine | Admitting: Rehabilitation

## 2015-08-28 DIAGNOSIS — M25611 Stiffness of right shoulder, not elsewhere classified: Secondary | ICD-10-CM | POA: Diagnosis present

## 2015-08-28 DIAGNOSIS — M25511 Pain in right shoulder: Secondary | ICD-10-CM | POA: Insufficient documentation

## 2015-08-28 DIAGNOSIS — R29898 Other symptoms and signs involving the musculoskeletal system: Secondary | ICD-10-CM | POA: Insufficient documentation

## 2015-08-28 NOTE — Therapy (Signed)
[facility] [address]  [place], [place], [address] Phone: [phone]   Fax:  [phone]  Physical Therapy Treatment  Patient Details  Name: [name] MRN: [id number] Date of Birth: [birth year] Referring Provider:  Dene Gentry, MD  Encounter Date: 08/28/2015      PT End of Session - 08/28/15 1706    Visit Number 12   Number of Visits 16   Date for PT Re-Evaluation 09/11/15   PT Start Time 3338   PT Stop Time 1800   PT Time Calculation (min) 55 min      Past Medical History  Diagnosis Date  . Allergy   . Asthma   . Hypertension     pulmonary  . Herpes genitalia   . H/O: myomectomy   . GERD (gastroesophageal reflux disease)     no meds  . Heart murmur   . TIA (transient ischemic attack)   . Refusal of blood transfusions as patient is Jehovah's Witness   . Hearing impaired     can hear on left side only    Past Surgical History  Procedure Laterality Date  . Myomectomy  2004      2010 robotic  . Ovarian cyst removal  2010    x2  . Breast lumpectomy  01/06/11  . Robotic assisted lap vaginal hysterectomy    . Cystoscopy  05/19/2012    Procedure: CYSTOSCOPY;  Surgeon: Alwyn Pea, MD;  Location: [facility];  Service: Gynecology;  Laterality: N/A;  . Robotic assisted laparoscopic ovarian cystectomy Right 03/20/2015    Procedure: ROBOTIC ASSISTED LAPAROSCOPIC Salpingectomy OVARIAN CYSTECTOMY, pelvic washings, excision of peritoneal pseudocyst.;  Surgeon: Delsa Bern, MD;  Location: [facility];  Service: Gynecology;  Laterality: Right;    There were no vitals filed for this visit.  Visit Diagnosis:  Right shoulder pain  Stiffness of right shoulder joint  Weakness of right arm      Subjective Assessment - 08/28/15 1706    Subjective Reports her shoulder is feeling pretty good today. Had a relaxing weekend which she thinks helped.    Currently in Pain? Yes   Pain Score 2    Pain Location  Shoulder   Pain Orientation Right                         [facility] Adult PT Treatment/Exercise - 08/28/15 1707    Exercises   Exercises Shoulder   Shoulder Exercises: Supine   Protraction Right;10 reps  2 sets   Protraction Weight (lbs) 3   Protraction Limitations Serratus Punches   Horizontal ABduction Strengthening;Both;10 reps  2 sets   Theraband Level (Shoulder Horizontal ABduction) Level 3 (Green)   External Rotation Both;10 reps;Theraband   Theraband Level (Shoulder External Rotation) Level 2 (Red)   Flexion AAROM;Both;Strengthening;15 reps  2 sets   Shoulder Flexion Weight (lbs) 3   ABduction 10 reps;Both;Strengthening  Abduction iso with chest press   Theraband Level (Shoulder ABduction) Level 2 (Red)   Other Supine Exercises Circles at 90 degrees flexion 3# x15 CW/CCW   Shoulder Exercises: Sidelying   ABduction AROM;Right;10 reps;Weights   ABduction Weight (lbs) 1   Other Sidelying Exercises Horizontal Abd to vertical at 90 dg shoulder flexion 1# x10   Shoulder Exercises: Standing   Extension Strengthening;Both;Theraband;15 reps  2 sets   Theraband Level (Shoulder Extension) Level 4 (Blue)   Row Strengthening;Both;Theraband;15 reps  2 sets  Theraband Level (Shoulder Row) Level 4 (Blue)   Shoulder Exercises: ROM/Strengthening   UBE (Upper Arm Bike) lvl 1.0 fwd/back 2' each   Modalities   Modalities Vasopneumatic   Vasopneumatic   Number Minutes Vasopneumatic  15 minutes   Vasopnuematic Location  Shoulder   Vasopneumatic Pressure Low   Vasopneumatic Temperature  Lowest   Manual Therapy   Manual Therapy Passive ROM;Joint mobilization;Soft tissue mobilization   Joint Mobilization Grade I-II AP and inferior glides, Grade I-II distraction   Soft tissue mobilization STM to pec in supine and sidelying with occasional DTM to pt tolerance   Passive ROM Into Flexion, IR, ER to tolerance                  PT Short Term Goals - 08/23/15 1746     PT SHORT TERM GOAL #1   Title Independent with initial HEP (08/14/15)   Status Achieved   PT SHORT TERM GOAL #2   Title Patient will demonstrate Right shoudler ROM to within 10 degrees of left shoulder without increased pain (08/14/15)   Status Partially Met   PT SHORT TERM GOAL #3   Title Patient will report worst pain in right shoulder no greater than 6/10 (08/14/15)   Status Achieved           PT Long Term Goals - 08/23/15 1746    PT LONG TERM GOAL #1   Title Independent with advanced HEP (09/11/15)   Status On-going   PT LONG TERM GOAL #2   Title Patient will demonstrate right shoulder ROM WFL for functional use of right UE during ADLs (09/11/15)   Status On-going   PT LONG TERM GOAL #3   Title Patient will report worst pain in right shoulder no greater than 4/10 (09/11/15)   Status On-going   PT LONG TERM GOAL #4   Title Patient will demonstrate right shoulder strength >/= 4/5 without pain (09/11/15)   Status On-going   PT LONG TERM GOAL #5   Title Patient will report worst pain in low back no greater than 5/10   Status On-going   PT LONG TERM GOAL #6   Title Patient will tolerate standing for at least 20 minutes without increased low back pain to improve abillity to complete household chores (09/11/15)   Status On-going   PT LONG TERM GOAL #7   Title Patient will report ability to walk at least 1 mile without increased low back pain (09/11/15)   Status On-going               Plan - 08/28/15 1748    Clinical Impression Statement Pt was able to return to exercises today with good tolerance and no complaint of pain. Did decrease some weight/resistance/ reps but will continue to return as pt is able.    PT Next Visit Plan Continue Rt shoulder focus as pain allows - posture training, stretches/ROM, strengthening with progression to more standing exercises, manual, modalities.    Consulted and Agree with Plan of Care Patient        Problem List Patient Active  Problem List   Diagnosis Date Noted  . Right shoulder pain 07/11/2015  . LUQ pain 04/10/2015  . Vaginal discharge 02/15/2015  . UTI symptoms 01/15/2015  . Bacterial vaginitis 12/24/2014  . Exposure to STD 06/11/2013  . Headache(784.0) 11/12/2012  . Hypokalemia 11/10/2012  . TIA (transient ischemic attack) 11/09/2012  . Nerve compression 05/20/2012  . GERD (gastroesophageal reflux disease)   . Herpes  genitalia   . H/O: myomectomy   . Fibroid uterus 03/17/2012  . PULMONARY HYPERTENSION 11/06/2010  . Morbid obesity ([place]) 12/04/2008  . Essential hypertension 10/22/2007  . HPV 03/24/2007  . LOSS, HEARING NOS 03/24/2007  . ASTHMA 03/24/2007    Barbette Hair, PTA 08/28/2015, 5:52 PM  [facility] [address]  [place], [place], [address] Phone: [phone]   Fax:  [phone]

## 2015-08-30 ENCOUNTER — Ambulatory Visit: Payer: 59 | Admitting: Physical Therapy

## 2015-08-30 DIAGNOSIS — R29898 Other symptoms and signs involving the musculoskeletal system: Secondary | ICD-10-CM

## 2015-08-30 DIAGNOSIS — M25511 Pain in right shoulder: Secondary | ICD-10-CM | POA: Diagnosis not present

## 2015-08-30 DIAGNOSIS — M25611 Stiffness of right shoulder, not elsewhere classified: Secondary | ICD-10-CM

## 2015-08-30 NOTE — Therapy (Signed)
[facility] [address]  [place], [place], [address] Phone: [phone]   Fax:  [phone]  Physical Therapy Treatment  Patient Details  Name: [name] MRN: [id number] Date of Birth: 05-26-1971 Referring Provider:  Dene Gentry, MD  Encounter Date: 08/30/2015      PT End of Session - 08/30/15 1706    Visit Number 13   Number of Visits 16   Date for PT Re-Evaluation 09/11/15   PT Start Time 1701   PT Stop Time 1755   PT Time Calculation (min) 54 min   Activity Tolerance Patient tolerated treatment well   Behavior During Therapy [facility] for tasks assessed/performed      Past Medical History  Diagnosis Date  . Allergy   . Asthma   . Hypertension     pulmonary  . Herpes genitalia   . H/O: myomectomy   . GERD (gastroesophageal reflux disease)     no meds  . Heart murmur   . TIA (transient ischemic attack)   . Refusal of blood transfusions as patient is Jehovah's Witness   . Hearing impaired     can hear on left side only    Past Surgical History  Procedure Laterality Date  . Myomectomy  2004      2010 robotic  . Ovarian cyst removal  2010    x2  . Breast lumpectomy  01/06/11  . Robotic assisted lap vaginal hysterectomy    . Cystoscopy  05/19/2012    Procedure: CYSTOSCOPY;  Surgeon: Alwyn Pea, MD;  Location: [facility];  Service: Gynecology;  Laterality: N/A;  . Robotic assisted laparoscopic ovarian cystectomy Right 03/20/2015    Procedure: ROBOTIC ASSISTED LAPAROSCOPIC Salpingectomy OVARIAN CYSTECTOMY, pelvic washings, excision of peritoneal pseudocyst.;  Surgeon: Delsa Bern, MD;  Location: [facility];  Service: Gynecology;  Laterality: Right;    There were no vitals filed for this visit.  Visit Diagnosis:  Right shoulder pain  Stiffness of right shoulder joint  Weakness of right arm      Subjective Assessment - 08/30/15 1703    Subjective Reports increased soreness in right shoulder  today but attributes it to fatigue from traveling to [facility] for work yesterday. Low back more irritated from walking around while shopping.   Currently in Pain? Yes   Pain Score 5    Pain Location Shoulder   Pain Orientation Right   Pain Score 8   Pain Location Back   Pain Orientation Mid;Lower            [facility] PT Assessment - 08/30/15 1701    ROM / Strength   AROM / PROM / Strength AROM   AROM   AROM Assessment Site Shoulder   Right/Left Shoulder Right   Right Shoulder Flexion 165 Degrees   Right Shoulder ABduction 148 Degrees   Right Shoulder Internal Rotation --  full FIR   Right Shoulder External Rotation 78 Degrees   Strength   Strength Assessment Site Shoulder   Right/Left Shoulder Right   Right Shoulder Flexion 4-/5   Right Shoulder ABduction 4/5   Right Shoulder Internal Rotation 4/5   Right Shoulder External Rotation 4-/5                  [facility] Adult PT Treatment/Exercise - 08/30/15 1701    Exercises   Exercises Shoulder   Shoulder Exercises: Supine   Protraction Right;10 reps  2 sets   Protraction Weight (lbs) 4  Protraction Limitations Serratus Punches   Horizontal ABduction Strengthening;Both;10 reps  2 sets   Theraband Level (Shoulder Horizontal ABduction) Level 4 (Blue)   External Rotation Both;10 reps;Theraband   Theraband Level (Shoulder External Rotation) Level 2 (Red)   Flexion AAROM;Both;Strengthening;15 reps  2 sets   Shoulder Flexion Weight (lbs) 4   ABduction 10 reps;Both;Strengthening  Abduction iso with chest press/pullover   Theraband Level (Shoulder ABduction) Level 2 (Red)   Other Supine Exercises Circles at 90 degrees flexion 4# x15 CW/CCW   Shoulder Exercises: Sidelying   ABduction AROM;Right;10 reps;Weights   ABduction Weight (lbs) 2   Other Sidelying Exercises Horizontal Abd to vertical at 90 dg shoulder flexion 2# x10   Shoulder Exercises: Standing   Internal Rotation Strengthening;Right;10 reps   Theraband Level  (Shoulder Internal Rotation) Level 2 (Red)   Extension Strengthening;Both;Theraband;15 reps  2 sets   Theraband Level (Shoulder Extension) Level 4 (Blue)   Row Strengthening;Both;Theraband;15 reps  2 sets   Theraband Level (Shoulder Row) Level 4 (Blue)   Shoulder Exercises: ROM/Strengthening   UBE (Upper Arm Bike) lvl 1.5 fwd/back 2' each   Shoulder Exercises: Isometric Strengthening   External Rotation Theraband;5X5"  2 sets   Theraband Level (External Rotation) Level 2 (Red)   Modalities   Modalities Vasopneumatic   Vasopneumatic   Number Minutes Vasopneumatic  15 minutes   Vasopnuematic Location  Shoulder   Vasopneumatic Pressure Low   Vasopneumatic Temperature  Lowest   Manual Therapy   Manual Therapy Passive ROM;Joint mobilization;Soft tissue mobilization   Joint Mobilization Grade I-II AP and inferior glides, Grade I-II distraction   Soft tissue mobilization STM to pec in supine and sidelying with occasional DTM to pt tolerance   Passive ROM Into Flexion, IR, ER to tolerance                  PT Short Term Goals - 08/30/15 1741    PT SHORT TERM GOAL #1   Title Independent with initial HEP (08/14/15)   Status Achieved   PT SHORT TERM GOAL #2   Title Patient will demonstrate Right shoudler ROM to within 10 degrees of left shoulder without increased pain (08/14/15)   Status Achieved   PT SHORT TERM GOAL #3   Title Patient will report worst pain in right shoulder no greater than 6/10 (08/14/15)   Status Achieved           PT Long Term Goals - 08/30/15 1742    PT LONG TERM GOAL #1   Title Independent with advanced HEP (09/11/15)   Status On-going   PT LONG TERM GOAL #2   Title Patient will demonstrate right shoulder ROM WFL for functional use of right UE during ADLs (09/11/15)   Status On-going   PT LONG TERM GOAL #3   Title Patient will report worst pain in right shoulder no greater than 4/10 (09/11/15)   Status On-going   PT LONG TERM GOAL #4   Title  Patient will demonstrate right shoulder strength >/= 4/5 without pain (09/11/15)   Status On-going   PT LONG TERM GOAL #5   Title Patient will report worst pain in low back no greater than 5/10   Status On-going   PT LONG TERM GOAL #6   Title Patient will tolerate standing for at least 20 minutes without increased low back pain to improve abillity to complete household chores (09/11/15)   Status On-going   PT LONG TERM GOAL #7   Title Patient will report  ability to walk at least 1 mile without increased low back pain (09/11/15)   Status On-going               Plan - 08/30/15 1743    Clinical Impression Statement Patient continues to experience increased soreness with fatigue from daily activities. Right shoulder ROM restored to full range with decreasing pain interference, but strength remains limited in part due to pain/guarding with resistance. Switched focus of ER strengthening to isometric with red TB with patient reporting less pain, mostly just fatigues quckly. Will continue shoulder focus for remainder of current POC and then recert with shifting focus to low back program.    PT Next Visit Plan Continue Rt shoulder focus as pain allows - posture training, stretches/ROM, strengthening with progression to more standing exercises, manual, modalities.    Consulted and Agree with Plan of Care Patient        Problem List Patient Active Problem List   Diagnosis Date Noted  . Right shoulder pain 07/11/2015  . LUQ pain 04/10/2015  . Vaginal discharge 02/15/2015  . UTI symptoms 01/15/2015  . Bacterial vaginitis 12/24/2014  . Exposure to STD 06/11/2013  . Headache(784.0) 11/12/2012  . Hypokalemia 11/10/2012  . TIA (transient ischemic attack) 11/09/2012  . Nerve compression 05/20/2012  . GERD (gastroesophageal reflux disease)   . Herpes genitalia   . H/O: myomectomy   . Fibroid uterus 03/17/2012  . PULMONARY HYPERTENSION 11/06/2010  . Morbid obesity ([place]) 12/04/2008  .  Essential hypertension 10/22/2007  . HPV 03/24/2007  . LOSS, HEARING NOS 03/24/2007  . ASTHMA 03/24/2007    Percival Spanish, PT, MPT 08/30/2015, 6:02 PM  [facility] [address]  [place], [place], [address] Phone: [phone]   Fax:  [phone]

## 2015-09-04 ENCOUNTER — Ambulatory Visit: Payer: 59 | Admitting: Physical Therapy

## 2015-09-04 DIAGNOSIS — M25511 Pain in right shoulder: Secondary | ICD-10-CM | POA: Diagnosis not present

## 2015-09-04 DIAGNOSIS — M25611 Stiffness of right shoulder, not elsewhere classified: Secondary | ICD-10-CM

## 2015-09-04 DIAGNOSIS — R29898 Other symptoms and signs involving the musculoskeletal system: Secondary | ICD-10-CM

## 2015-09-04 NOTE — Therapy (Signed)
[facility] [address]  [place], [place], [address] Phone: [phone]   Fax:  [phone]  Physical Therapy Treatment  Patient Details  Name: [name] MRN: [id number] Date of Birth: 01-04-1971 Referring Provider:  Dene Gentry, MD  Encounter Date: 09/04/2015      PT End of Session - 09/04/15 1717    Visit Number 14   Number of Visits 16   Date for PT Re-Evaluation 09/11/15   PT Start Time 7341  Pt arrived late   PT Stop Time 1744   PT Time Calculation (min) 31 min   Activity Tolerance Patient tolerated treatment well   Behavior During Therapy [facility] for tasks assessed/performed      Past Medical History  Diagnosis Date  . Allergy   . Asthma   . Hypertension     pulmonary  . Herpes genitalia   . H/O: myomectomy   . GERD (gastroesophageal reflux disease)     no meds  . Heart murmur   . TIA (transient ischemic attack)   . Refusal of blood transfusions as patient is Jehovah's Witness   . Hearing impaired     can hear on left side only    Past Surgical History  Procedure Laterality Date  . Myomectomy  2004      2010 robotic  . Ovarian cyst removal  2010    x2  . Breast lumpectomy  01/06/11  . Robotic assisted lap vaginal hysterectomy    . Cystoscopy  05/19/2012    Procedure: CYSTOSCOPY;  Surgeon: Alwyn Pea, MD;  Location: [facility];  Service: Gynecology;  Laterality: N/A;  . Robotic assisted laparoscopic ovarian cystectomy Right 03/20/2015    Procedure: ROBOTIC ASSISTED LAPAROSCOPIC Salpingectomy OVARIAN CYSTECTOMY, pelvic washings, excision of peritoneal pseudocyst.;  Surgeon: Delsa Bern, MD;  Location: [facility];  Service: Gynecology;  Laterality: Right;    There were no vitals filed for this visit.  Visit Diagnosis:  Right shoulder pain  Stiffness of right shoulder joint  Weakness of right arm      Subjective Assessment - 09/04/15 1716    Subjective Patient denies pain, just  soreness today. Feeling good other than fatigued from work. States did some cleaning over the weekend and did not notice any increased pain.   Currently in Pain? No/denies               [facility] Adult PT Treatment/Exercise - 09/04/15 1713    Exercises   Exercises Shoulder   Shoulder Exercises: Sidelying   ABduction AROM;Right;10 reps;Weights   ABduction Weight (lbs) 2   Other Sidelying Exercises Horizontal Abd to vertical at 90 dg shoulder flexion 2# x10   Other Sidelying Exercises Circles at 90 dg abduction CW/CCW 2# x10 each way   Shoulder Exercises: Standing   Horizontal ABduction Strengthening;Both;10 reps;Theraband  2 sets   Theraband Level (Shoulder Horizontal ABduction) Level 4 (Blue)   Horizontal ABduction Limitations standing against 1/2 foam roll on wall   Other Standing Exercises Rt UE D2 flexion with red TB 10x3", standing against 1/2 foam roll on wall   Shoulder Exercises: ROM/Strengthening   UBE (Upper Arm Bike) lvl 1.5 fwd/back 2' each   Cybex Row 10 reps  2 sets   Cybex Row Limitations 20#   Wall Pushups 10 reps   Shoulder Exercises: Isometric Strengthening   External Rotation Theraband;5X5"  2 sets   Theraband Level (External Rotation) Level 2 (Red)   Manual Therapy  Manual Therapy Passive ROM;Joint mobilization;Soft tissue mobilization   Joint Mobilization Grade I-II AP and inferior glides, Grade I-II distraction, scapular mobs   Soft tissue mobilization STM to pec in supine and sidelying    Passive ROM Into IR & ER to tolerance                  PT Short Term Goals - 08/30/15 1741    PT SHORT TERM GOAL #1   Title Independent with initial HEP (08/14/15)   Status Achieved   PT SHORT TERM GOAL #2   Title Patient will demonstrate Right shoudler ROM to within 10 degrees of left shoulder without increased pain (08/14/15)   Status Achieved   PT SHORT TERM GOAL #3   Title Patient will report worst pain in right shoulder no greater than 6/10 (08/14/15)    Status Achieved           PT Long Term Goals - 08/30/15 1742    PT LONG TERM GOAL #1   Title Independent with advanced HEP (09/11/15)   Status On-going   PT LONG TERM GOAL #2   Title Patient will demonstrate right shoulder ROM WFL for functional use of right UE during ADLs (09/11/15)   Status On-going   PT LONG TERM GOAL #3   Title Patient will report worst pain in right shoulder no greater than 4/10 (09/11/15)   Status On-going   PT LONG TERM GOAL #4   Title Patient will demonstrate right shoulder strength >/= 4/5 without pain (09/11/15)   Status On-going   PT LONG TERM GOAL #5   Title Patient will report worst pain in low back no greater than 5/10   Status On-going   PT LONG TERM GOAL #6   Title Patient will tolerate standing for at least 20 minutes without increased low back pain to improve abillity to complete household chores (09/11/15)   Status On-going   PT LONG TERM GOAL #7   Title Patient will report ability to walk at least 1 mile without increased low back pain (09/11/15)   Status On-going               Plan - 09/04/15 1749    Clinical Impression Statement Patient reporting less pain, "just soreness" over past few days even after doing some cleaning over weekend. Progressed exercises to include more standing exercises with good tolerance, but therapy limited today secondary to late arrival. Given improved pain control, will plan to review updated shouler HEP over next visit or two in prep for transition to low back program.   PT Next Visit Plan Continue Rt shoulder focus - posture training, stretches/ROM, strengthening with progression to more standing exercises with HEP update; manual, modalities PRN   Consulted and Agree with Plan of Care Patient        Problem List Patient Active Problem List   Diagnosis Date Noted  . Right shoulder pain 07/11/2015  . LUQ pain 04/10/2015  . Vaginal discharge 02/15/2015  . UTI symptoms 01/15/2015  . Bacterial  vaginitis 12/24/2014  . Exposure to STD 06/11/2013  . Headache(784.0) 11/12/2012  . Hypokalemia 11/10/2012  . TIA (transient ischemic attack) 11/09/2012  . Nerve compression 05/20/2012  . GERD (gastroesophageal reflux disease)   . Herpes genitalia   . H/O: myomectomy   . Fibroid uterus 03/17/2012  . PULMONARY HYPERTENSION 11/06/2010  . Morbid obesity ([place]) 12/04/2008  . Essential hypertension 10/22/2007  . HPV 03/24/2007  . LOSS, HEARING NOS 03/24/2007  .  ASTHMA 03/24/2007    Percival Spanish, PT, MPT 09/04/2015, 6:02 PM  [facility] [address]  [place], [place], [address] Phone: [phone]   Fax:  [phone]

## 2015-09-11 ENCOUNTER — Ambulatory Visit: Payer: 59 | Admitting: Physical Therapy

## 2015-09-11 DIAGNOSIS — R29898 Other symptoms and signs involving the musculoskeletal system: Secondary | ICD-10-CM

## 2015-09-11 DIAGNOSIS — M25611 Stiffness of right shoulder, not elsewhere classified: Secondary | ICD-10-CM

## 2015-09-11 DIAGNOSIS — M25511 Pain in right shoulder: Secondary | ICD-10-CM

## 2015-09-11 NOTE — Therapy (Addendum)
[facility] [address]  [place], [place], [address] Phone: [phone]   Fax:  [phone]  Physical Therapy Treatment  Patient Details  Name: [name] MRN: [id number] Date of Birth: 1971/04/29 Referring Provider: Dene Gentry, MD  Encounter Date: 09/11/2015      PT End of Session - 09/11/15 1707    Visit Number 15   Number of Visits 16   Date for PT Re-Evaluation 09/18/15   PT Start Time 1702   PT Stop Time 1744   PT Time Calculation (min) 42 min   Activity Tolerance Patient tolerated treatment well   Behavior During Therapy [facility] for tasks assessed/performed      Past Medical History  Diagnosis Date  . Allergy   . Asthma   . Hypertension     pulmonary  . Herpes genitalia   . H/O: myomectomy   . GERD (gastroesophageal reflux disease)     no meds  . Heart murmur   . TIA (transient ischemic attack)   . Refusal of blood transfusions as patient is Jehovah's Witness   . Hearing impaired     can hear on left side only    Past Surgical History  Procedure Laterality Date  . Myomectomy  2004      2010 robotic  . Ovarian cyst removal  2010    x2  . Breast lumpectomy  01/06/11  . Robotic assisted lap vaginal hysterectomy    . Cystoscopy  05/19/2012    Procedure: CYSTOSCOPY;  Surgeon: Alwyn Pea, MD;  Location: [facility];  Service: Gynecology;  Laterality: N/A;  . Robotic assisted laparoscopic ovarian cystectomy Right 03/20/2015    Procedure: ROBOTIC ASSISTED LAPAROSCOPIC Salpingectomy OVARIAN CYSTECTOMY, pelvic washings, excision of peritoneal pseudocyst.;  Surgeon: Delsa Bern, MD;  Location: [facility];  Service: Gynecology;  Laterality: Right;    There were no vitals filed for this visit.  Visit Diagnosis:  Right shoulder pain  Stiffness of right shoulder joint  Weakness of right arm      Subjective Assessment - 09/11/15 1706    Subjective Patient reporting no pain in shoulder and  states able to do some lifting at the gym without pain.   Currently in Pain? No/denies   Pain Location Shoulder   Pain Score 6   Pain Location Back   Pain Orientation Mid;Lower            [facility] PT Assessment - 09/11/15 1702    Assessment   Medical Diagnosis Right shoulder pain/RTC impingment   Referring Provider Dene Gentry, MD   Next MD Visit 10/02/15   ROM / Strength   AROM / PROM / Strength AROM;Strength   AROM   AROM Assessment Site Shoulder   Right/Left Shoulder Right   Right Shoulder Flexion 164 Degrees   Right Shoulder ABduction 160 Degrees   Right Shoulder Internal Rotation --  full FIR   Right Shoulder External Rotation 82 Degrees   Strength   Strength Assessment Site Shoulder   Right/Left Shoulder Right   Right Shoulder Flexion 4/5   Right Shoulder ABduction 4+/5   Right Shoulder Internal Rotation 4+/5   Right Shoulder External Rotation 4/5        FOTO - Shoulder: 84% (16% limitation)              [facility] Adult PT Treatment/Exercise - 09/11/15 1702    Exercises   Exercises Shoulder   Shoulder Exercises: Standing  Horizontal ABduction Strengthening;Both;15 reps   Theraband Level (Shoulder Horizontal ABduction) Level 4 (Blue)   Horizontal ABduction Limitations standing against 1/2 foam roll on wall   Extension Strengthening;Both;20 reps;Theraband   Theraband Level (Shoulder Extension) Level 4 (Blue)   Row Strengthening;Both;20 reps   Theraband Level (Shoulder Row) Level 4 (Blue)   Other Standing Exercises Rt UE D2 flexion with red TB 15x3"   Shoulder Exercises: ROM/Strengthening   UBE (Upper Arm Bike) lvl 2.0 fwd/back 2' each   Cybex Row 10 reps  2 sets   Cybex Row Limitations 20#   Wall Pushups 10 reps  2 sets   Shoulder Exercises: Isometric Strengthening   External Rotation Theraband;5X5"  2 sets   Theraband Level (External Rotation) Level 2 (Red);Level 3 (Green)   Shoulder Exercises: 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;3 reps                 PT Education - 09/11/15 1749    Education provided Yes   Education Details Final HEP for right shoulder   Person(s) Educated Patient   Methods Explanation;Demonstration;Handout   Comprehension Verbalized understanding;Returned demonstration          PT Short Term Goals - 08/30/15 1741    PT SHORT TERM GOAL #1   Title Independent with initial HEP (08/14/15)   Status Achieved   PT SHORT TERM GOAL #2   Title Patient will demonstrate Right shoudler ROM to within 10 degrees of left shoulder without increased pain (08/14/15)   Status Achieved   PT SHORT TERM GOAL #3   Title Patient will report worst pain in right shoulder no greater than 6/10 (08/14/15)   Status Achieved           PT Long Term Goals - 09/11/15 1735    PT LONG TERM GOAL #1   Title Independent with advanced HEP (09/11/15)   Status Achieved  for shoulder HEP   PT LONG TERM GOAL #2   Title Patient will demonstrate right shoulder ROM WFL for functional use of right UE during ADLs (09/11/15)   Status Achieved   PT LONG TERM GOAL #3   Title Patient will report worst pain in right shoulder no greater than 4/10 (09/11/15)   Status Achieved   PT LONG TERM GOAL #4   Title Patient will demonstrate right shoulder strength >/= 4/5 without pain (09/11/15)   Status Achieved   PT LONG TERM GOAL #5   Title Patient will report worst pain in low back no greater than 5/10   Status On-going   PT LONG TERM GOAL #6   Title Patient will tolerate standing for at least 20 minutes without increased low back pain to improve abillity to complete household chores (09/11/15)   Status On-going   PT LONG TERM GOAL #7   Title Patient will report ability to walk at least 1 mile without increased low back pain (09/11/15)   Status On-going               Plan - 09/11/15 1749    Clinical Impression Statement Patient remains pain free in right shoulder with full ROM and strength WNL. Able to use arm functionally  with all ADL's and household/work activities and has resumed UE weight lifting without any pain. Shoulder HEP finalized with patient able to demonstrate all exercises appropriately and updated handout provided to patient. All shoulder goals met at this time, therefore will transition to low back program with re-eval at next visit.  PT Next Visit Plan Re-eval for transition to low back program per referral from Rosalita Chessman, MD   Consulted and Agree with Plan of Care Patient        Problem List Patient Active Problem List   Diagnosis Date Noted  . Right shoulder pain 07/11/2015  . LUQ pain 04/10/2015  . Vaginal discharge 02/15/2015  . UTI symptoms 01/15/2015  . Bacterial vaginitis 12/24/2014  . Exposure to STD 06/11/2013  . Headache(784.0) 11/12/2012  . Hypokalemia 11/10/2012  . TIA (transient ischemic attack) 11/09/2012  . Nerve compression 05/20/2012  . GERD (gastroesophageal reflux disease)   . Herpes genitalia   . H/O: myomectomy   . Fibroid uterus 03/17/2012  . PULMONARY HYPERTENSION 11/06/2010  . Morbid obesity ([place]) 12/04/2008  . Essential hypertension 10/22/2007  . HPV 03/24/2007  . LOSS, HEARING NOS 03/24/2007  . ASTHMA 03/24/2007    Percival Spanish, PT, MPT 09/11/2015, 6:02 PM  [facility] [address]  [place], [place], [address] Phone: [phone]   Fax:  [phone]  Name: [name] MRN: [id number] Date of Birth: 06/15/1971    PHYSICAL THERAPY DISCHARGE SUMMARY  Visits from Start of Care: 15  Current functional level related to goals / functional outcomes:  Patient remains pain free in right shoulder with full ROM and strength WNL. Able to use arm functionally with all ADL's and household/work activities and has resumed UE weight lifting without any pain. Shoulder HEP finalized with patient able to demonstrate all exercises appropriately and updated handout provided to patient. All  shoulder goals met at this time, therefore will proceed with discharge for shoulder POC episode.   Remaining deficits:  None for shoulder. Low back pain (separate referral received from patient's primary MD).   Education / Equipment:  HEP  Plan: Patient agrees to discharge.  Patient goals were met. Patient is being discharged due to meeting the stated rehab goals.  ?????       Percival Spanish, PT, MPT 09/11/2015, 6:09 PM  [facility] [address]  [place], [place], [address] Phone: [phone]   Fax:  [phone]

## 2015-09-18 ENCOUNTER — Ambulatory Visit: Payer: 59 | Admitting: Physical Therapy

## 2015-09-18 ENCOUNTER — Encounter: Payer: Self-pay | Admitting: Family Medicine

## 2015-09-25 ENCOUNTER — Ambulatory Visit: Payer: 59 | Attending: Family Medicine | Admitting: Physical Therapy

## 2015-09-25 DIAGNOSIS — M5440 Lumbago with sciatica, unspecified side: Secondary | ICD-10-CM | POA: Insufficient documentation

## 2015-09-25 DIAGNOSIS — R262 Difficulty in walking, not elsewhere classified: Secondary | ICD-10-CM

## 2015-09-25 NOTE — Therapy (Signed)
[facility] [address]  [place], [place], [address] Phone: [phone]   Fax:  [phone]  Physical Therapy Evaluation  Patient Details  Name: [name] MRN: [id number] Date of Birth: 1971/06/20 Referring Provider: Garnet Koyanagi, DO  Encounter Date: 09/25/2015      PT End of Session - 09/25/15 1811    Visit Number 1   Number of Visits 12   Date for PT Re-Evaluation 11/06/15   PT Start Time 1706   PT Stop Time 9833   PT Time Calculation (min) 42 min   Activity Tolerance Patient tolerated treatment well   Behavior During Therapy [facility] for tasks assessed/performed      Past Medical History  Diagnosis Date  . Allergy   . Asthma   . Hypertension     pulmonary  . Herpes genitalia   . H/O: myomectomy   . GERD (gastroesophageal reflux disease)     no meds  . Heart murmur   . TIA (transient ischemic attack)   . Refusal of blood transfusions as patient is Jehovah's Witness   . Hearing impaired     can hear on left side only    Past Surgical History  Procedure Laterality Date  . Myomectomy  2004      2010 robotic  . Ovarian cyst removal  2010    x2  . Breast lumpectomy  01/06/11  . Robotic assisted lap vaginal hysterectomy    . Cystoscopy  05/19/2012    Procedure: CYSTOSCOPY;  Surgeon: Alwyn Pea, MD;  Location: [facility];  Service: Gynecology;  Laterality: N/A;  . Robotic assisted laparoscopic ovarian cystectomy Right 03/20/2015    Procedure: ROBOTIC ASSISTED LAPAROSCOPIC Salpingectomy OVARIAN CYSTECTOMY, pelvic washings, excision of peritoneal pseudocyst.;  Surgeon: Delsa Bern, MD;  Location: [facility];  Service: Gynecology;  Laterality: Right;    There were no vitals filed for this visit.  Visit Diagnosis:  Midline low back pain with sciatica, sciatica laterality unspecified  Difficulty walking      Subjective Assessment - 09/25/15 1709    Subjective Patient recently completed shoulder  rehab program and reports increase in shoulder pain over past few days but admits to poor compliance with HEP. Now initiating low back rehab program. Patient reports h/o low back pain stemming from positioning during hysterectomy surgery ~3 yrs ago. Had PT at the time with some improvement noted, but more recently noting that pain still flares up with static standing and walking, requiring frequent rest seated breaks to allow the pain to subside.   Limitations Standing;Walking   How long can you stand comfortably? <5 minutes   How long can you walk comfortably? < 1/2 mile   Diagnostic tests Lumbar spine x-ray 07/05/15: Mild degenerative disc space narrowing at L3-4, and mild facet joint hypertrophy at L4-5 and at L5-S1. There is no acute bony abnormality.    Patient Stated Goals "To be able to stand without pain and walk for > 1 mile without pain so I can get back to exercising like I used to."   Currently in Pain? Yes   Pain Score 5   Least 0/10, Avg 9/10, Worst 12/10; Was 8/10 earlier today while walking   Pain Location Back   Pain Orientation Mid;Lower   Pain Descriptors / Indicators Throbbing;Sharp   Pain Radiating Towards Pain radiates laterally and down legs, typically to posterior thigh but sometimes all the way down the legs. Reports rarely has radicular numbness and  tingling down legs.   Pain Onset More than a month ago   Pain Frequency Intermittent   Aggravating Factors  Static standing, walking   Pain Relieving Factors Flexion movements (child's pose, downward dog), Ibuprofen   Effect of Pain on Daily Activities Limits standing tolerance. Unable to walk and workout as desired.            [facility] PT Assessment - 09/25/15 1506    Assessment   Medical Diagnosis Low Back Pain   Referring Provider Garnet Koyanagi, DO   Onset Date/Surgical Date --  > 1 year   Next MD Visit none   Prior Therapy ~3 yrs ago for LBP   Balance Screen   Has the patient fallen in the past 6 months No   Has  the patient had a decrease in activity level because of a fear of falling?  No   Is the patient reluctant to leave their home because of a fear of falling?  No   Prior Function   Level of Independence Independent   Vocation Full time employment   Engineer, manufacturing job   Leisure Read, dance   Observation/Other Assessments   Focus on Therapeutic Outcomes (FOTO)  Lumbar spine: 53% (47% limitation), Predicted 62% (38% limitation)   Posture/Postural Control   Posture/Postural Control Postural limitations   Postural Limitations Increased lumbar lordosis;Anterior pelvic tilt   ROM / Strength   AROM / PROM / Strength AROM;Strength   AROM   Lumbar Flexion WFL   Lumbar Extension ~70% primarily in upper lumbar range   Lumbar - Right Side Bend WFL   Lumbar - Left Side Bend WFL   Lumbar - Right Rotation WFL   Lumbar - Left Rotation WFL   Strength   Overall Strength Within functional limits for tasks performed   Strength Assessment Site Hip   Right/Left Shoulder --   Right/Left Hip Right;Left   Flexibility   Soft Tissue Assessment /Muscle Length yes   Hamstrings Mildly tight bilaterally   Quadriceps Tight bilaterally, esp rectus femoris   ITB Tight bilaterally   Piriformis Mildly tight bilaterally   Special Tests    Special Tests Lumbar;Hip Special Tests   Lumbar Tests FABER test;Straight Leg Raise;Prone Knee Bend Test   Hip Special Tests  Ober's Test;Thomas Test   FABER test   findings Positive   Side --  bilateral   Prone Knee Bend Test   Findings Positive   Side --  bilateral    Straight Leg Raise   Findings Negative   Side  --  bilateral   Thomas Test    Findings Positive   Side --  bilateral   Ober's Test   Findings Positive   Side --  bilateral                   [facility] Adult PT Treatment/Exercise - 09/25/15 1706    Exercises   Exercises Lumbar   Lumbar Exercises: Stretches   Passive Hamstring Stretch 20 seconds;2 reps   Passive Hamstring  Stretch Limitations supine with strap, bilateral   Single Knee to Chest Stretch 1 rep;20 seconds   Single Knee to Chest Stretch Limitations bilateral   Double Knee to Chest Stretch 1 rep;20 seconds   Hip Flexor Stretch 20 seconds;2 reps   Hip Flexor Stretch Limitations modified thomas stretch & prone rectus femoris stretch, bilateral   ITB Stretch 20 seconds;2 reps   ITB Stretch Limitations supine with strap, bilateral  PT Short Term Goals - 09/25/15 1833    PT SHORT TERM GOAL #1   Title Independent with initial HEP (10/09/15)   Time 2   Period Weeks   Status New           PT Long Term Goals - 09/25/15 1834    PT LONG TERM GOAL #1   Title Independent with advanced HEP (11/06/15)   Time 6   Period Weeks   Status New   PT LONG TERM GOAL #2   Title Patient will report worst pain in low back no greater than 5/10(11/06/15)   Baseline Avg pain 8-9/10, Worst 12/10   Time 6   Period Weeks   Status New   PT LONG TERM GOAL #3   Title Patient will tolerate standing for at least 20 minutes without increased low back pain to improve abillity to complete household chores (11/06/15)   Baseline Tolerates static standing for < 5 minutes   Time 6   Period Weeks   Status New   PT LONG TERM GOAL #4   Title Patient will report ability to walk at least 1 mile without increased low back pain (11/06/15)   Baseline Patient reporting onset of intense LBP with distances < 1/2 mile   Time 6   Period Weeks   Status New               Plan - 09/25/15 1816    Clinical Impression Statement Ms. Grein has recently completed OP PT for her shoudler and now presents to OP PT with chief complaint of low back pain limiting static standing tolerance and walking tolerance. Patient reports pain typically very intense (avg 8-9/10) when present and often includes radicular pain down the back of her legs with occasional radicular numbness and tingling when pain most intense  (12/10). Assessment reveals lumbar ROM WFL except mild limitation in extension ROM with majority of extension occuring in upper lumbar spine. Posture demonstrates excessive lumbar lordosis with anterior pelvic tilt resulting in tightness in most proximal LE muscle groups, especially hip flexors, ITB and piriformis. Patient will benefit from skilled PT to restore normal flexibility and improve core strength/stability for improved postural alignment and reduced pain.   Pt will benefit from skilled therapeutic intervention in order to improve on the following deficits Pain;Decreased range of motion;Impaired flexibility;Difficulty walking;Decreased activity tolerance;Postural dysfunction   Rehab Potential Good   PT Frequency 2x / week   PT Duration 6 weeks   PT Treatment/Interventions Therapeutic exercise;Manual techniques;Passive range of motion;Taping;Therapeutic activities;Electrical Stimulation;Iontophoresis 4mg /ml Dexamethasone;Ultrasound;Cryotherapy;Moist Heat;Traction;Patient/family education   PT Next Visit Plan Review lumbar/LE stretches, initiate lumbar/core strengthening and stability exercises, ? mechanical traction, modalities PRN   Consulted and Agree with Plan of Care Patient         Problem List Patient Active Problem List   Diagnosis Date Noted  . Right shoulder pain 07/11/2015  . LUQ pain 04/10/2015  . Vaginal discharge 02/15/2015  . UTI symptoms 01/15/2015  . Bacterial vaginitis 12/24/2014  . Exposure to STD 06/11/2013  . Headache(784.0) 11/12/2012  . Hypokalemia 11/10/2012  . TIA (transient ischemic attack) 11/09/2012  . Nerve compression 05/20/2012  . GERD (gastroesophageal reflux disease)   . Herpes genitalia   . H/O: myomectomy   . Fibroid uterus 03/17/2012  . PULMONARY HYPERTENSION 11/06/2010  . Morbid obesity ([place]) 12/04/2008  . Essential hypertension 10/22/2007  . HPV 03/24/2007  . LOSS, HEARING NOS 03/24/2007  . ASTHMA 03/24/2007    Percival Spanish, PT,  MPT 09/25/2015, 6:48 PM  [facility] [address]  [place], [place], [address] Phone: [phone]   Fax:  [phone]  Name: [name] MRN: [id number] Date of Birth: 1971-02-01

## 2015-10-02 ENCOUNTER — Ambulatory Visit (INDEPENDENT_AMBULATORY_CARE_PROVIDER_SITE_OTHER): Payer: 59 | Admitting: Family Medicine

## 2015-10-02 ENCOUNTER — Encounter: Payer: Self-pay | Admitting: Family Medicine

## 2015-10-02 VITALS — BP 107/76 | HR 84 | Ht 63.0 in | Wt 190.0 lb

## 2015-10-02 DIAGNOSIS — M25511 Pain in right shoulder: Secondary | ICD-10-CM

## 2015-10-04 ENCOUNTER — Ambulatory Visit: Payer: 59 | Admitting: Physical Therapy

## 2015-10-04 NOTE — Assessment & Plan Note (Signed)
2/2 rotator cuff impingement.  Much improved with PT, HEP.  Encouraged to continue home exercises for about 6 more weeks at least.  Follow up with me as needed.

## 2015-10-04 NOTE — Progress Notes (Signed)
PCP and referred by: Garnet Koyanagi, DO  Subjective:   HPI: Patient is a 44 y.o. female here for right shoulder pain.  8/16: Patient denies known injury or trauma. Reports pain has been ongoing since march, worse recently. Difficulty raising arm overhead, lifting purse, dressing self. Tried stretching. Cannot sleep on side. Right handed.  9/27: Patient reports she feels about 65% improved from last visit. Doing PT about twice a week. No night pain. Not needing to take medicine though has ibuprofen if she needs to. Doing home exercises as well. Pain level 3/10.  11/8: Patient reports feeling over 90% improved. Pain level 0/10. Finished with PT but doing home exercises. Used nitro initially but not anymore because she feels improved. No skin changes, fever, other complaints.  Past Medical History  Diagnosis Date  . Allergy   . Asthma   . Hypertension     pulmonary  . Herpes genitalia   . H/O: myomectomy   . GERD (gastroesophageal reflux disease)     no meds  . Heart murmur   . TIA (transient ischemic attack)   . Refusal of blood transfusions as patient is Jehovah's Witness   . Hearing impaired     can hear on left side only    Current Outpatient Prescriptions on File Prior to Visit  Medication Sig Dispense Refill  . albuterol (PROVENTIL HFA;VENTOLIN HFA) 108 (90 BASE) MCG/ACT inhaler Inhale 2 puffs into the lungs every 6 (six) hours as needed for wheezing. 1 Inhaler 2  . Ascorbic Acid (VITAMIN C) 1000 MG tablet Take 1,000 mg by mouth daily.    Marland Kitchen aspirin 325 MG tablet Take 1 tablet (325 mg total) by mouth daily.    . cetirizine (ZYRTEC) 10 MG tablet Take 10 mg by mouth daily.    . cholecalciferol (VITAMIN D) 1000 UNITS tablet Take 1,000 Units by mouth daily.    . cyclobenzaprine (FLEXERIL) 10 MG tablet Take 1 tablet (10 mg total) by mouth 3 (three) times daily as needed for muscle spasms. 30 tablet 0  . ibuprofen (ADVIL,MOTRIN) 600 MG tablet Take 600 mg by mouth  every 6 (six) hours as needed.    Marland Kitchen lisinopril (PRINIVIL,ZESTRIL) 20 MG tablet Take 1 tablet (20 mg total) by mouth daily. 90 tablet 3  . meloxicam (MOBIC) 15 MG tablet Take 1 tablet (15 mg total) by mouth daily. 30 tablet 0  . metoprolol (LOPRESSOR) 100 MG tablet Take 1 tablet (100 mg total) by mouth 2 (two) times daily. 180 tablet 3  . Multiple Vitamins-Calcium (ONE-A-DAY WOMENS PO) Take 1 tablet by mouth daily.    . nitroGLYCERIN (MINITRAN) 0.2 mg/hr patch Apply 1/4th patch to affected shoulder, change daily 30 patch 1  . spironolactone (ALDACTONE) 25 MG tablet Take 1 tablet (25 mg total) by mouth daily. 90 tablet 3  . valACYclovir (VALTREX) 500 MG tablet Take 500 mg by mouth daily.      No current facility-administered medications on file prior to visit.    Past Surgical History  Procedure Laterality Date  . Myomectomy  2004      2010 robotic  . Ovarian cyst removal  2010    x2  . Breast lumpectomy  01/06/11  . Robotic assisted lap vaginal hysterectomy    . Cystoscopy  05/19/2012    Procedure: CYSTOSCOPY;  Surgeon: Alwyn Pea, MD;  Location: [facility];  Service: Gynecology;  Laterality: N/A;  . Robotic assisted laparoscopic ovarian cystectomy Right 03/20/2015    Procedure: ROBOTIC ASSISTED LAPAROSCOPIC  Salpingectomy OVARIAN CYSTECTOMY, pelvic washings, excision of peritoneal pseudocyst.;  Surgeon: Delsa Bern, MD;  Location: [facility];  Service: Gynecology;  Laterality: Right;    No Known Allergies  Social History   Social History  . Marital Status: Single    Spouse Name: N/A  . Number of Children: N/A  . Years of Education: N/A   Occupational History  . Not on file.   Social History Main Topics  . Smoking status: Never Smoker   . Smokeless tobacco: Never Used  . Alcohol Use: No  . Drug Use: No  . Sexual Activity:    Partners: Male    Birth Control/ Protection: Surgical     Comment: hyst   Other Topics Concern  . Not on file   Social History Narrative    Exercise---walk, zumba, americ[employer] ballet video    Family History  Problem Relation Age of Onset  . Asthma Mother   . Arthritis Mother   . Hypertension Father   . Heart disease Maternal Grandmother     PCI  . Cancer Maternal Grandmother 13    breast  . Cancer Other 60    breast and possible colon  . Diabetes Brother   . Migraines Sister     BP 107/76 mmHg  Pulse 84  Ht 5\' 3"  (1.6 m)  Wt 190 lb (86.183 kg)  BMI 33.67 kg/m2  LMP 04/18/2012  Review of Systems: See HPI above.    Objective:  Physical Exam:  Gen: NAD  Right shoulder: No swelling, ecchymoses.  No gross deformity. No TTP. FROM without painful arc. Negative Hawkins, Neers. Negative Speeds, Yergasons. Strength 5/5 with empty can and resisted internal/external rotation.  No pain empty can. Negative apprehension. NV intact distally.    Left shoulder: FROM without pain.  Assessment & Plan:  1. Right shoulder pain - 2/2 rotator cuff impingement.  Much improved with PT, HEP.  Encouraged to continue home exercises for about 6 more weeks at least.  Follow up with me as needed.

## 2015-10-09 ENCOUNTER — Ambulatory Visit: Payer: 59 | Admitting: Physical Therapy

## 2015-10-09 DIAGNOSIS — M5440 Lumbago with sciatica, unspecified side: Secondary | ICD-10-CM | POA: Diagnosis not present

## 2015-10-09 NOTE — Therapy (Signed)
[facility] [address]  [place], [place], [address] Phone: [phone]   Fax:  [phone]  Physical Therapy Treatment  Patient Details  Name: [name] MRN: [id number] Date of Birth: [birth year] Referring Provider: Garnet Koyanagi, DO  Encounter Date: 10/09/2015      PT End of Session - 10/09/15 1627    Visit Number 2   Number of Visits 12   Date for PT Re-Evaluation 11/06/15   PT Start Time 1618   PT Stop Time 1719   PT Time Calculation (min) 61 min   Activity Tolerance Patient tolerated treatment well   Behavior During Therapy [facility] for tasks assessed/performed      Past Medical History  Diagnosis Date  . Allergy   . Asthma   . Hypertension     pulmonary  . Herpes genitalia   . H/O: myomectomy   . GERD (gastroesophageal reflux disease)     no meds  . Heart murmur   . TIA (transient ischemic attack)   . Refusal of blood transfusions as patient is Jehovah's Witness   . Hearing impaired     can hear on left side only    Past Surgical History  Procedure Laterality Date  . Myomectomy  2004      2010 robotic  . Ovarian cyst removal  2010    x2  . Breast lumpectomy  01/06/11  . Robotic assisted lap vaginal hysterectomy    . Cystoscopy  05/19/2012    Procedure: CYSTOSCOPY;  Surgeon: Alwyn Pea, MD;  Location: [facility];  Service: Gynecology;  Laterality: N/A;  . Robotic assisted laparoscopic ovarian cystectomy Right 03/20/2015    Procedure: ROBOTIC ASSISTED LAPAROSCOPIC Salpingectomy OVARIAN CYSTECTOMY, pelvic washings, excision of peritoneal pseudocyst.;  Surgeon: Delsa Bern, MD;  Location: [facility];  Service: Gynecology;  Laterality: Right;    There were no vitals filed for this visit.  Visit Diagnosis:  Midline low back pain with sciatica, sciatica laterality unspecified      Subjective Assessment - 10/09/15 1622    Subjective Patient reports tired and stressed today with low back pain a  "solid 6/10" and had been up to an 8/10 earlier. Did do HEP this morning and reports no issues with HEP.   Currently in Pain? Yes   Pain Score 6    Pain Location Back   Pain Orientation Mid;Lower                   [facility] Adult PT Treatment/Exercise - 10/09/15 1618    Exercises   Exercises Lumbar   Lumbar Exercises: Stretches   Passive Hamstring Stretch 20 seconds;3 reps   Passive Hamstring Stretch Limitations supine with strap, bilateral   Single Knee to Chest Stretch 20 seconds;3 reps   Single Knee to Chest Stretch Limitations bilateral   Double Knee to Chest Stretch 20 seconds;3 reps   Lower Trunk Rotation 10 seconds;5 reps   Hip Flexor Stretch 20 seconds;3 reps   Hip Flexor Stretch Limitations modified thomas stretch & prone rectus femoris stretch, bilateral   ITB Stretch 20 seconds;3 reps   ITB Stretch Limitations supine with strap, bilateral   Piriformis Stretch 20 seconds;3 reps   Piriformis Stretch Limitations supine with legs crossed   Lumbar Exercises: Aerobic   Stationary Bike Rec bike lvl 1 x 3'   Lumbar Exercises: Supine   Ab Set 10 reps;5 seconds   Clam 10 reps;3 seconds   Clam Limitations TrA +  SL hip abd/ER   Bent Knee Raise 10 reps;3 seconds   Bent Knee Raise Limitations TrA + alternating march   Bridge 10 reps;5 seconds   Bridge Limitations TrA   Modalities   Modalities Traction   Traction   Type of Traction Lumbar  Prone - neutral pull   Min (lbs) 25   Max (lbs) 50   Hold Time 60   Rest Time 20   Time 15                  PT Short Term Goals - 10/09/15 1814    PT SHORT TERM GOAL #1   Title Independent with initial HEP (10/09/15)   Status On-going           PT Long Term Goals - 10/09/15 1815    PT LONG TERM GOAL #1   Title Independent with advanced HEP (11/06/15)   Status On-going   PT LONG TERM GOAL #2   Title Patient will report worst pain in low back no greater than 5/10(11/06/15)   Status On-going   PT LONG TERM  GOAL #3   Title Patient will tolerate standing for at least 20 minutes without increased low back pain to improve abillity to complete household chores (11/06/15)   Status On-going   PT LONG TERM GOAL #4   Title Patient will report ability to walk at least 1 mile without increased low back pain (11/06/15)   Status On-going   PT LONG TERM GOAL #5   Title Patient will report worst pain in low back no greater than 5/10   Status On-going               Plan - 10/09/15 1811    Clinical Impression Statement Patient reporting good relief with stretches and mechanical traction along with good tolerance for basic lumbar stabilization exercises. Will plan to transition majority of stretches to primarily HEP completion and focus on exercise progression during therapy sessions.   PT Next Visit Plan Lumbar/core strengthening and stability exercises, mechanical traction, manual therapy & modalities PRN   Consulted and Agree with Plan of Care Patient        Problem List Patient Active Problem List   Diagnosis Date Noted  . Right shoulder pain 07/11/2015  . LUQ pain 04/10/2015  . Vaginal discharge 02/15/2015  . UTI symptoms 01/15/2015  . Bacterial vaginitis 12/24/2014  . Exposure to STD 06/11/2013  . Headache(784.0) 11/12/2012  . Hypokalemia 11/10/2012  . TIA (transient ischemic attack) 11/09/2012  . Nerve compression 05/20/2012  . GERD (gastroesophageal reflux disease)   . Herpes genitalia   . H/O: myomectomy   . Fibroid uterus 03/17/2012  . PULMONARY HYPERTENSION 11/06/2010  . Morbid obesity ([place]) 12/04/2008  . Essential hypertension 10/22/2007  . HPV 03/24/2007  . LOSS, HEARING NOS 03/24/2007  . ASTHMA 03/24/2007    Percival Spanish, PT, MPT 10/09/2015, 6:16 PM  [facility] [address]  [place], [place], [address] Phone: [phone]   Fax:  [phone]  Name: [name] MRN: [id number] Date of Birth:  [birth year]

## 2015-10-11 ENCOUNTER — Ambulatory Visit: Payer: 59 | Admitting: Physical Therapy

## 2015-10-11 DIAGNOSIS — M5440 Lumbago with sciatica, unspecified side: Secondary | ICD-10-CM | POA: Diagnosis not present

## 2015-10-11 NOTE — Therapy (Signed)
[facility] [address]  [place], [place], [address] Phone: [phone]   Fax:  [phone]  Physical Therapy Treatment  Patient Details  Name: [name] MRN: [id number] Date of Birth: 03/21/1971 Referring Provider: Garnet Koyanagi, DO  Encounter Date: 10/11/2015      PT End of Session - 10/11/15 1625    Visit Number 3   Number of Visits 12   Date for PT Re-Evaluation 11/06/15   PT Start Time 1620  Patient arrived late   PT Stop Time 1731   PT Time Calculation (min) 71 min   Activity Tolerance Patient tolerated treatment well   Behavior During Therapy [facility] for tasks assessed/performed      Past Medical History  Diagnosis Date  . Allergy   . Asthma   . Hypertension     pulmonary  . Herpes genitalia   . H/O: myomectomy   . GERD (gastroesophageal reflux disease)     no meds  . Heart murmur   . TIA (transient ischemic attack)   . Refusal of blood transfusions as patient is Jehovah's Witness   . Hearing impaired     can hear on left side only    Past Surgical History  Procedure Laterality Date  . Myomectomy  2004      2010 robotic  . Ovarian cyst removal  2010    x2  . Breast lumpectomy  01/06/11  . Robotic assisted lap vaginal hysterectomy    . Cystoscopy  05/19/2012    Procedure: CYSTOSCOPY;  Surgeon: Alwyn Pea, MD;  Location: [facility];  Service: Gynecology;  Laterality: N/A;  . Robotic assisted laparoscopic ovarian cystectomy Right 03/20/2015    Procedure: ROBOTIC ASSISTED LAPAROSCOPIC Salpingectomy OVARIAN CYSTECTOMY, pelvic washings, excision of peritoneal pseudocyst.;  Surgeon: Delsa Bern, MD;  Location: [facility];  Service: Gynecology;  Laterality: Right;    There were no vitals filed for this visit.  Visit Diagnosis:  Midline low back pain with sciatica, sciatica laterality unspecified      Subjective Assessment - 10/11/15 1623    Subjective Patient reports back felt really good  after last therapy visit especially after traction and reports no pain at present today.   Currently in Pain? No/denies                [facility] Adult PT Treatment/Exercise - 10/11/15 1620    Exercises   Exercises Lumbar   Lumbar Exercises: Stretches   Lower Trunk Rotation 10 seconds;5 reps   Quadruped Mid Back Stretch 20 seconds;3 reps  each   Quadruped Mid Back Stretch Limitations 3 way prayer stretch   Quad Stretch 20 seconds;3 reps   Quad Stretch Limitations with hip extended and knee elevated on pillow to emphasize rectus femoris   Lumbar Exercises: Aerobic   Stationary Bike Rec bike lvl 1 x 3'   Lumbar Exercises: Seated   Long Arc Quad on [place] Both;10 reps   LAQ on [place] Limitations Green (65cm) Pball   Hip Flexion on Ball Both;10 reps   Hip Flexion on Ball Limitations Green (65 cm) Pball   Other Seated Lumbar Exercises Hip circles CW/CCW x10   Other Seated Lumbar Exercises [place] Row with green TB x10 bilateral   Lumbar Exercises: Supine   Ab Set 10 reps;5 seconds   Clam 10 reps;3 seconds   Clam Limitations TrA + SL hip abd/ER with green TB   Bent Knee Raise 10 reps;3 seconds   Bent  Knee Raise Limitations TrA + alternating march   Dead Bug 10 reps;3 seconds   Dead Bug Limitations TrA   Bridge 10 reps;5 seconds   Bridge Limitations TrA   Other Supine Lumbar Exercises TrA + Bridge + DL clam with green TB 10x3"   Lumbar Exercises: Quadruped   Madcat/Old Horse 10 reps   Madcat/Old Horse Limitations 5" hold in each position   Modalities   Modalities Traction   Traction   Type of Traction Lumbar  Prone - neutral pull   Min (lbs) 30   Max (lbs) 60   Hold Time 60   Rest Time 20   Time 15                  PT Short Term Goals - 10/09/15 1814    PT SHORT TERM GOAL #1   Title Independent with initial HEP (10/09/15)   Status On-going           PT Long Term Goals - 10/09/15 1815    PT LONG TERM GOAL #1   Title Independent with advanced HEP  (11/06/15)   Status On-going   PT LONG TERM GOAL #2   Title Patient will report worst pain in low back no greater than 5/10(11/06/15)   Status On-going   PT LONG TERM GOAL #3   Title Patient will tolerate standing for at least 20 minutes without increased low back pain to improve abillity to complete household chores (11/06/15)   Status On-going   PT LONG TERM GOAL #4   Title Patient will report ability to walk at least 1 mile without increased low back pain (11/06/15)   Status On-going   PT LONG TERM GOAL #5   Title Patient will report worst pain in low back no greater than 5/10   Status On-going               Plan - 10/11/15 1714    Clinical Impression Statement Patient reporting good relief of pain after last therapy session and presents to therapy today without pain. Reviewed prone and quadruped stretches not addressed at last visit and added cat/camel in quadruped. Progressed complexity of supine exercises and initiated seated exercises on physioball. Tolerated exercise progression without complaints. Increased pull on mechanical traction given positive response at last visit.   PT Next Visit Plan Lumbar/core strengthening and stability exercises, mechanical traction, manual therapy & modalities PRN   Consulted and Agree with Plan of Care Patient        Problem List Patient Active Problem List   Diagnosis Date Noted  . Right shoulder pain 07/11/2015  . LUQ pain 04/10/2015  . Vaginal discharge 02/15/2015  . UTI symptoms 01/15/2015  . Bacterial vaginitis 12/24/2014  . Exposure to STD 06/11/2013  . Headache(784.0) 11/12/2012  . Hypokalemia 11/10/2012  . TIA (transient ischemic attack) 11/09/2012  . Nerve compression 05/20/2012  . GERD (gastroesophageal reflux disease)   . Herpes genitalia   . H/O: myomectomy   . Fibroid uterus 03/17/2012  . PULMONARY HYPERTENSION 11/06/2010  . Morbid obesity ([place]) 12/04/2008  . Essential hypertension 10/22/2007  . HPV  03/24/2007  . LOSS, HEARING NOS 03/24/2007  . ASTHMA 03/24/2007    Percival Spanish, PT, MPT  10/11/2015, 5:38 PM  [facility] [address]  [place], [place], [address] Phone: [phone]   Fax:  [phone]  Name: [name] MRN: [id number] Date of Birth: March 21, 1971

## 2015-10-16 ENCOUNTER — Ambulatory Visit: Payer: 59 | Admitting: Physical Therapy

## 2015-10-23 ENCOUNTER — Ambulatory Visit: Payer: 59 | Admitting: Physical Therapy

## 2015-10-23 DIAGNOSIS — M5440 Lumbago with sciatica, unspecified side: Secondary | ICD-10-CM

## 2015-10-23 DIAGNOSIS — R262 Difficulty in walking, not elsewhere classified: Secondary | ICD-10-CM

## 2015-10-23 NOTE — Therapy (Signed)
[facility] [address]  [place], [place], [address] Phone: [phone]   Fax:  [phone]  Physical Therapy Treatment  Patient Details  Name: [name] MRN: [id number] Date of Birth: 11-Mar-1971 Referring Provider: Garnet Koyanagi, DO  Encounter Date: 10/23/2015      PT End of Session - 10/23/15 1708    Visit Number 4   Number of Visits 12   Date for PT Re-Evaluation 11/06/15   PT Start Time 1700   PT Stop Time 1801   PT Time Calculation (min) 61 min   Activity Tolerance Patient tolerated treatment well   Behavior During Therapy [facility] for tasks assessed/performed      Past Medical History  Diagnosis Date  . Allergy   . Asthma   . Hypertension     pulmonary  . Herpes genitalia   . H/O: myomectomy   . GERD (gastroesophageal reflux disease)     no meds  . Heart murmur   . TIA (transient ischemic attack)   . Refusal of blood transfusions as patient is Jehovah's Witness   . Hearing impaired     can hear on left side only    Past Surgical History  Procedure Laterality Date  . Myomectomy  2004      2010 robotic  . Ovarian cyst removal  2010    x2  . Breast lumpectomy  01/06/11  . Robotic assisted lap vaginal hysterectomy    . Cystoscopy  05/19/2012    Procedure: CYSTOSCOPY;  Surgeon: Alwyn Pea, MD;  Location: [facility];  Service: Gynecology;  Laterality: N/A;  . Robotic assisted laparoscopic ovarian cystectomy Right 03/20/2015    Procedure: ROBOTIC ASSISTED LAPAROSCOPIC Salpingectomy OVARIAN CYSTECTOMY, pelvic washings, excision of peritoneal pseudocyst.;  Surgeon: Delsa Bern, MD;  Location: [place] [facility];  Service: Gynecology;  Laterality: Right;    There were no vitals filed for this visit.  Visit Diagnosis:  Midline low back pain with sciatica, sciatica laterality unspecified  Difficulty walking      Subjective Assessment - 10/23/15 1703    Subjective Patient denies any pain today, but  worked from home today. States back was bothering her quite a bit yesterday when she was walking around the mall.   Currently in Pain? No/denies                   [facility] Adult PT Treatment/Exercise - 10/23/15 1700    Exercises   Exercises Lumbar   Lumbar Exercises: Aerobic   Stationary Bike Rec bike lvl 1 x 3'   Lumbar Exercises: Standing   Functional Squats 10 reps;3 seconds  2 sets   Functional Squats Limitations TRX, 2nd set with heel raise on rising   Wall Slides 5 reps;3 seconds   Wall Slides Limitations stopped d/t pain   Lumbar Exercises: Seated   Long Arc Quad on [place] Both;10 reps   LAQ on [place] Limitations Green (65cm) Pball   Hip Flexion on Ball Both;10 reps   Hip Flexion on Ball Limitations Green (65 cm) Pball   Other Seated Lumbar Exercises Hip circles CW/CCW x10   Other Seated Lumbar Exercises [place] Row with green TB x10 bilateral   Lumbar Exercises: Supine   Ab Set 10 reps;5 seconds   Clam 10 reps;3 seconds   Clam Limitations TrA + SL hip abd/ER with green TB   Bent Knee Raise 10 reps;3 seconds   Bent Knee Raise Limitations TrA + alternating march with  green TB around knees   Dead Bug 10 reps;3 seconds   Dead Bug Limitations TrA   Bridge 10 reps;5 seconds   Bridge Limitations TrA + Bil hip abd isometric with green TB   Other Supine Lumbar Exercises TrA + Bridge + Alt SL clam with green TB 10x3"   Lumbar Exercises: Sidelying   Clam 10 reps;3 seconds   Clam Limitations Green TB, bilateral   Lumbar Exercises: Quadruped   Madcat/Old Horse 10 reps   Madcat/Old Horse Limitations 5" hold in each position   Single Arm Raise Right;Left;10 reps;3 seconds   Straight Leg Raise 10 reps;3 seconds   Modalities   Modalities Electrical Stimulation;Moist Heat   Moist Heat Therapy   Number Minutes Moist Heat 10 Minutes   Moist Heat Location Lumbar Spine   Electrical Stimulation   Electrical Stimulation Location Lumbar spine   Electrical Stimulation Action IFC    Electrical Stimulation Parameters 40% scan, 80-150 Hz, intensity to patient tolerance x10'   Electrical Stimulation Goals Pain                PT Education - 10/23/15 1758    Education provided Yes   Education Details HEP addition/update - lumbar stabilization exercises   Person(s) Educated Patient   Methods Explanation;Demonstration;Handout   Comprehension Verbalized understanding;Returned demonstration          PT Short Term Goals - 10/23/15 1808    PT SHORT TERM GOAL #1   Title Independent with initial HEP (10/09/15)   Status Achieved           PT Long Term Goals - 10/23/15 1808    PT LONG TERM GOAL #1   Title Independent with advanced HEP (11/06/15)   Status On-going   PT LONG TERM GOAL #2   Title Patient will report worst pain in low back no greater than 5/10(11/06/15)   Status On-going   PT LONG TERM GOAL #3   Title Patient will tolerate standing for at least 20 minutes without increased low back pain to improve abillity to complete household chores (11/06/15)   Status On-going   PT LONG TERM GOAL #4   Title Patient will report ability to walk at least 1 mile without increased low back pain (11/06/15)   Status On-going             Plan - 10/23/15 1759    Clinical Impression Statement Patient reporting pain continues to limit walking tolerance but otherwise pain less frequent. Good tolerance for progression of exercises with exception of wall slides which increased her pain. Traction not available at end of treatment, therefore utilized estim and moist heat to reduce pain, with good relief reported by patient. HEP updated to include lumbar stabilzation exercises.   PT Next Visit Plan Lumbar/core strengthening and stability exercises, mechanical traction, manual therapy & modalities PRN   Consulted and Agree with Plan of Care Patient        Problem List Patient Active Problem List   Diagnosis Date Noted  . Right shoulder pain 07/11/2015  .  LUQ pain 04/10/2015  . Vaginal discharge 02/15/2015  . UTI symptoms 01/15/2015  . Bacterial vaginitis 12/24/2014  . Exposure to STD 06/11/2013  . Headache(784.0) 11/12/2012  . Hypokalemia 11/10/2012  . TIA (transient ischemic attack) 11/09/2012  . Nerve compression 05/20/2012  . GERD (gastroesophageal reflux disease)   . Herpes genitalia   . H/O: myomectomy   . Fibroid uterus 03/17/2012  . PULMONARY HYPERTENSION 11/06/2010  . Morbid obesity (  [place]) 12/04/2008  . Essential hypertension 10/22/2007  . HPV 03/24/2007  . LOSS, HEARING NOS 03/24/2007  . ASTHMA 03/24/2007    Percival Spanish, PT, MPT 10/23/2015, 6:12 PM  [facility] [address]  [place], [place], [address] Phone: [phone]   Fax:  [phone]  Name: [name] MRN: [id number] Date of Birth: 1971/05/17

## 2015-10-26 ENCOUNTER — Ambulatory Visit: Payer: 59 | Attending: Family Medicine | Admitting: Physical Therapy

## 2015-10-26 DIAGNOSIS — R262 Difficulty in walking, not elsewhere classified: Secondary | ICD-10-CM | POA: Diagnosis present

## 2015-10-26 DIAGNOSIS — M5440 Lumbago with sciatica, unspecified side: Secondary | ICD-10-CM | POA: Insufficient documentation

## 2015-10-26 NOTE — Therapy (Signed)
[facility] [address]  [place], [place], [address] Phone: [phone]   Fax:  [phone]  Physical Therapy Treatment  Patient Details  Name: [name] MRN: [id number] Date of Birth: [birth year] Referring Provider: Garnet Koyanagi, DO  Encounter Date: 10/26/2015      PT End of Session - 10/26/15 1110    Visit Number 5   Number of Visits 12   Date for PT Re-Evaluation 11/06/15   PT Start Time 1104   PT Stop Time 1201   PT Time Calculation (min) 57 min   Activity Tolerance Patient tolerated treatment well   Behavior During Therapy [facility] for tasks assessed/performed      Past Medical History  Diagnosis Date  . Allergy   . Asthma   . Hypertension     pulmonary  . Herpes genitalia   . H/O: myomectomy   . GERD (gastroesophageal reflux disease)     no meds  . Heart murmur   . TIA (transient ischemic attack)   . Refusal of blood transfusions as patient is Jehovah's Witness   . Hearing impaired     can hear on left side only    Past Surgical History  Procedure Laterality Date  . Myomectomy  2004      2010 robotic  . Ovarian cyst removal  2010    x2  . Breast lumpectomy  01/06/11  . Robotic assisted lap vaginal hysterectomy    . Cystoscopy  05/19/2012    Procedure: CYSTOSCOPY;  Surgeon: Alwyn Pea, MD;  Location: [facility];  Service: Gynecology;  Laterality: N/A;  . Robotic assisted laparoscopic ovarian cystectomy Right 03/20/2015    Procedure: ROBOTIC ASSISTED LAPAROSCOPIC Salpingectomy OVARIAN CYSTECTOMY, pelvic washings, excision of peritoneal pseudocyst.;  Surgeon: Delsa Bern, MD;  Location: [facility];  Service: Gynecology;  Laterality: Right;    There were no vitals filed for this visit.  Visit Diagnosis:  Midline low back pain with sciatica, sciatica laterality unspecified  Difficulty walking      Subjective Assessment - 10/26/15 1108    Subjective Patient denies pain at present but states  has "just been chiilin" today Noted some increased pain yesterday while doing household chores (laundry).   Currently in Pain? No/denies                [facility] Adult PT Treatment/Exercise - 10/26/15 1104    Exercises   Exercises Lumbar   Lumbar Exercises: Aerobic   Stationary Bike Rec bike lvl 2 x 5'   Lumbar Exercises: Machines for Strengthening   Other Lumbar Machine Exercise Bil Low Row without abdominal pad support 20# x10, Alternating SA Row 10# x10   Lumbar Exercises: Supine   Clam 15 reps;3 seconds   Clam Limitations TrA + SL hip abd/ER with blue TB   Bent Knee Raise 15 reps;3 seconds   Bent Knee Raise Limitations TrA + alternating march with blue TB around knees   Dead Bug 15 reps;3 seconds   Dead Bug Limitations TrA   Bridge 15 reps;3 seconds   Bridge Limitations TrA + Bil hip abd isometric with blue TB   Other Supine Lumbar Exercises TrA + Bridge + Alt SL clam with blue TB 10x3"   Lumbar Exercises: Sidelying   Clam 3 seconds;15 reps   Clam Limitations Blue TB, bilateral   Lumbar Exercises: Quadruped   Madcat/Old Horse 15 reps   Madcat/Old Horse Limitations 5" hold in each position   Opposite  Arm/Leg Raise Right arm/Left leg;Left arm/Right leg;10 reps;3 seconds   Other Quadruped Lumbar Exercises Prone over 55cm ball - "I", "T", "Y" x10   Modalities   Modalities Traction   Traction   Type of Traction Lumbar  Prone - neutral pull   Min (lbs) 70   Max (lbs) 35   Hold Time 60   Rest Time 20   Time 15                  PT Short Term Goals - 10/23/15 1808    PT SHORT TERM GOAL #1   Title Independent with initial HEP (10/09/15)   Status Achieved           PT Long Term Goals - 10/23/15 1808    PT LONG TERM GOAL #1   Title Independent with advanced HEP (11/06/15)   Status On-going   PT LONG TERM GOAL #2   Title Patient will report worst pain in low back no greater than 5/10(11/06/15)   Status On-going   PT LONG TERM GOAL #3   Title Patient  will tolerate standing for at least 20 minutes without increased low back pain to improve abillity to complete household chores (11/06/15)   Status On-going   PT LONG TERM GOAL #4   Title Patient will report ability to walk at least 1 mile without increased low back pain (11/06/15)   Status On-going   PT LONG TERM GOAL #5   Title --   Status --               Plan - 10/26/15 1200    Clinical Impression Statement Progressed exercises with increased reps/resistance with increased faigue noted but no increased pain. Resumed traction with patient continuing to report positive response. Improving core control with neutral spine and will plan to include more oblique training in subsequent visits.   PT Next Visit Plan Lumbar/core strengthening and stability exercises, mechanical traction, manual therapy & modalities PRN   Consulted and Agree with Plan of Care Patient        Problem List Patient Active Problem List   Diagnosis Date Noted  . Right shoulder pain 07/11/2015  . LUQ pain 04/10/2015  . Vaginal discharge 02/15/2015  . UTI symptoms 01/15/2015  . Bacterial vaginitis 12/24/2014  . Exposure to STD 06/11/2013  . Headache(784.0) 11/12/2012  . Hypokalemia 11/10/2012  . TIA (transient ischemic attack) 11/09/2012  . Nerve compression 05/20/2012  . GERD (gastroesophageal reflux disease)   . Herpes genitalia   . H/O: myomectomy   . Fibroid uterus 03/17/2012  . PULMONARY HYPERTENSION 11/06/2010  . Morbid obesity ([place]) 12/04/2008  . Essential hypertension 10/22/2007  . HPV 03/24/2007  . LOSS, HEARING NOS 03/24/2007  . ASTHMA 03/24/2007    Percival Spanish, PT, MPT 10/26/2015, 12:25 PM  [facility] [address]  [place], [place], [address] Phone: [phone]   Fax:  [phone]  Name: AURIE CUSTIS MRN: [id number] Date of Birth: 11/19/1971

## 2015-11-06 ENCOUNTER — Ambulatory Visit: Payer: 59 | Admitting: Physical Therapy

## 2015-11-06 DIAGNOSIS — M5440 Lumbago with sciatica, unspecified side: Secondary | ICD-10-CM | POA: Diagnosis not present

## 2015-11-06 DIAGNOSIS — R262 Difficulty in walking, not elsewhere classified: Secondary | ICD-10-CM

## 2015-11-06 NOTE — Therapy (Signed)
[facility] [address]  [place], [place], [address] Phone: [phone]   Fax:  [phone]  Physical Therapy Treatment  Patient Details  Name: [name] MRN: [id number] Date of Birth: 1971-07-22 Referring Provider: Garnet Koyanagi, DO  Encounter Date: 11/06/2015      PT End of Session - 11/06/15 1714    Visit Number 6   Number of Visits 12   Date for PT Re-Evaluation 12/18/15   PT Start Time 1708  Patient arrived late   PT Stop Time 1809   PT Time Calculation (min) 61 min   Activity Tolerance Patient tolerated treatment well   Behavior During Therapy [facility] for tasks assessed/performed      Past Medical History  Diagnosis Date  . Allergy   . Asthma   . Hypertension     pulmonary  . Herpes genitalia   . H/O: myomectomy   . GERD (gastroesophageal reflux disease)     no meds  . Heart murmur   . TIA (transient ischemic attack)   . Refusal of blood transfusions as patient is Jehovah's Witness   . Hearing impaired     can hear on left side only    Past Surgical History  Procedure Laterality Date  . Myomectomy  2004      2010 robotic  . Ovarian cyst removal  2010    x2  . Breast lumpectomy  01/06/11  . Robotic assisted lap vaginal hysterectomy    . Cystoscopy  05/19/2012    Procedure: CYSTOSCOPY;  Surgeon: Alwyn Pea, MD;  Location: [facility];  Service: Gynecology;  Laterality: N/A;  . Robotic assisted laparoscopic ovarian cystectomy Right 03/20/2015    Procedure: ROBOTIC ASSISTED LAPAROSCOPIC Salpingectomy OVARIAN CYSTECTOMY, pelvic washings, excision of peritoneal pseudocyst.;  Surgeon: Delsa Bern, MD;  Location: [facility];  Service: Gynecology;  Laterality: Right;    There were no vitals filed for this visit.  Visit Diagnosis:  Midline low back pain with sciatica, sciatica laterality unspecified  Difficulty walking      Subjective Assessment - 11/06/15 1711    Subjective Work has been  busier but pain has been mostly well controlled. Still limited with prolonged standing or walking due to pain but able to shift weight some to help allievate pain.   Currently in Pain? No/denies                 [facility] Adult PT Treatment/Exercise - 11/06/15 1708    Exercises   Exercises Lumbar   Lumbar Exercises: Stretches   Lower Trunk Rotation 5 reps;10 seconds   Lower Trunk Rotation Limitations Feet on orange (55cm) Pball   Lumbar Exercises: Aerobic   Stationary Bike Rec bike interval 3-5 x 4'   Lumbar Exercises: Standing   Functional Squats Limitations deferred d/t c/o knee pain   Lumbar Exercises: Seated   Other Seated Lumbar Exercises Hip circles CW/CCW x10   Other Seated Lumbar Exercises Low Row & Obilique Row with blue TB x10 bilateral seated on green (65cm) Pball   Lumbar Exercises: Supine   Clam 15 reps;3 seconds   Clam Limitations TrA + SL hip abd/ER with blue TB   Bridge Compliant;10 reps;3 seconds   Bridge Limitations TrA + feet on orange (55cm) Pball   Other Supine Lumbar Exercises TrA + Bridge + Alt SL clam with blue TB 10x3"   Other Supine Lumbar Exercises TrA + combined shoulder extension with 4# and hamstring curl with feet  on orange (55cm) Pball   Lumbar Exercises: Sidelying   Clam 15 reps;3 seconds   Clam Limitations Blue TB, bilateral   Lumbar Exercises: Quadruped   Madcat/Old Horse 15 reps   Madcat/Old Horse Limitations 5" hold in each position   Opposite Arm/Leg Raise Right arm/Left leg;Left arm/Right leg;15 reps;3 seconds   Modalities   Modalities Traction   Traction   Type of Traction Lumbar  Prone - neutral pull   Min (lbs) 75   Max (lbs) 35   Hold Time 60   Rest Time 20   Time 15                  PT Short Term Goals - 10/23/15 1808    PT SHORT TERM GOAL #1   Title Independent with initial HEP (10/09/15)   Status Achieved           PT Long Term Goals - 11/06/15 1813    PT LONG TERM GOAL #1   Title Independent with  advanced HEP (12/18/15)   Status On-going   PT LONG TERM GOAL #2   Title Patient will report worst pain in low back no greater than 5/10(12/18/15)   Status On-going   PT LONG TERM GOAL #3   Title Patient will tolerate standing for at least 20 minutes without increased low back pain to improve abillity to complete household chores (12/18/15)   Status On-going   PT LONG TERM GOAL #4   Title Patient will report ability to walk at least 1 mile without increased low back pain (12/18/15)   Status On-going               Plan - 11/06/15 1753    Clinical Impression Statement Patient reporting less pain with pain typically only with prolonged standing or walking at this point. Increased use of physioball for compliant surface during lumbar stabilization and rotation to increased oblique recruitment with increased fatigue noted. Patient only able to come 1x/wk due to work schedule, therefore POC duration extended.   PT Next Visit Plan Lumbar/core strengthening and stability exercises, mechanical traction, manual therapy & modalities PRN   Consulted and Agree with Plan of Care Patient        Problem List Patient Active Problem List   Diagnosis Date Noted  . Right shoulder pain 07/11/2015  . LUQ pain 04/10/2015  . Vaginal discharge 02/15/2015  . UTI symptoms 01/15/2015  . Bacterial vaginitis 12/24/2014  . Exposure to STD 06/11/2013  . Headache(784.0) 11/12/2012  . Hypokalemia 11/10/2012  . TIA (transient ischemic attack) 11/09/2012  . Nerve compression 05/20/2012  . GERD (gastroesophageal reflux disease)   . Herpes genitalia   . H/O: myomectomy   . Fibroid uterus 03/17/2012  . PULMONARY HYPERTENSION 11/06/2010  . Morbid obesity ([place]) 12/04/2008  . Essential hypertension 10/22/2007  . HPV 03/24/2007  . LOSS, HEARING NOS 03/24/2007  . ASTHMA 03/24/2007    Percival Spanish, PT, MPT 11/06/2015, 6:17 PM  [facility] [address]  [place], [place], [address] Phone: [phone]   Fax:  [phone]  Name: [name] MRN: [id number] Date of Birth: October 21, 1971

## 2015-11-12 ENCOUNTER — Ambulatory Visit: Payer: 59 | Admitting: Physical Therapy

## 2015-11-12 DIAGNOSIS — R262 Difficulty in walking, not elsewhere classified: Secondary | ICD-10-CM

## 2015-11-12 DIAGNOSIS — M5440 Lumbago with sciatica, unspecified side: Secondary | ICD-10-CM | POA: Diagnosis not present

## 2015-11-12 NOTE — Therapy (Signed)
[facility] [address]  [place], [place], [address] Phone: [phone]   Fax:  [phone]  Physical Therapy Treatment  Patient Details  Name: SKYLEIGH JOVEL MRN: [id number] Date of Birth: 03/19/1971 Referring Provider: Garnet Koyanagi, DO  Encounter Date: 11/12/2015      PT End of Session - 11/12/15 1715    Visit Number 7   Number of Visits 12   Date for PT Re-Evaluation 12/18/15   PT Start Time 1708  Patient arrived late   PT Stop Time 1810   PT Time Calculation (min) 62 min   Activity Tolerance Patient tolerated treatment well   Behavior During Therapy [facility] for tasks assessed/performed      Past Medical History  Diagnosis Date  . Allergy   . Asthma   . Hypertension     pulmonary  . Herpes genitalia   . H/O: myomectomy   . GERD (gastroesophageal reflux disease)     no meds  . Heart murmur   . TIA (transient ischemic attack)   . Refusal of blood transfusions as patient is Jehovah's Witness   . Hearing impaired     can hear on left side only    Past Surgical History  Procedure Laterality Date  . Myomectomy  2004      2010 robotic  . Ovarian cyst removal  2010    x2  . Breast lumpectomy  01/06/11  . Robotic assisted lap vaginal hysterectomy    . Cystoscopy  05/19/2012    Procedure: CYSTOSCOPY;  Surgeon: Alwyn Pea, MD;  Location: [facility];  Service: Gynecology;  Laterality: N/A;  . Robotic assisted laparoscopic ovarian cystectomy Right 03/20/2015    Procedure: ROBOTIC ASSISTED LAPAROSCOPIC Salpingectomy OVARIAN CYSTECTOMY, pelvic washings, excision of peritoneal pseudocyst.;  Surgeon: Delsa Bern, MD;  Location: [facility];  Service: Gynecology;  Laterality: Right;    There were no vitals filed for this visit.  Visit Diagnosis:  Midline low back pain with sciatica, sciatica laterality unspecified  Difficulty walking      Subjective Assessment - 11/12/15 1710    Subjective Patient reports  still some pain when walking over the weekend, but able to mitigate pain by pausing and stretching.   Currently in Pain? No/denies   Pain Score 0-No pain  up to 6/10 over weekend                 [facility] Adult PT Treatment/Exercise - 11/12/15 1708    Exercises   Exercises Lumbar   Lumbar Exercises: Stretches   Lower Trunk Rotation 5 reps;10 seconds   Lower Trunk Rotation Limitations Feet on orange (55cm) Pball   Lumbar Exercises: Aerobic   Stationary Bike Rec bike interval 3-5 x 4'   Lumbar Exercises: Machines for Strengthening   Other Lumbar Machine Exercise Bil Low Row without abdominal pad support (focus on core control) 20# x10, 25# x10; Alternating SA Row 15# x10   Lumbar Exercises: Seated   Other Seated Lumbar Exercises Pball roll-out into bridge 10x5"   Other Seated Lumbar Exercises Obilique Row with double blue TB x15 bil seated on green (65cm) Pball   Lumbar Exercises: Supine   Bridge Compliant;10 reps;3 seconds   Bridge Limitations TrA + feet on orange (55cm) Pball   Other Supine Lumbar Exercises TrA + combined bil shoulder extension with 5# and hamstring curl with feet on orange (55cm) Pball x15   Lumbar Exercises: Prone   Other Prone Lumbar Exercises Superman  10x5"   Lumbar Exercises: Quadruped   Opposite Arm/Leg Raise Right arm/Left leg;Left arm/Right leg;15 reps;3 seconds   Modalities   Modalities Traction   Traction   Type of Traction Lumbar  Prone - neutral pull   Min (lbs) 38   Max (lbs) 76   Hold Time 60   Rest Time 20   Time 15                  PT Short Term Goals - 10/23/15 1808    PT SHORT TERM GOAL #1   Title Independent with initial HEP (10/09/15)   Status Achieved           PT Long Term Goals - 11/12/15 1818    PT LONG TERM GOAL #1   Title Independent with advanced HEP (12/18/15)   Status On-going   PT LONG TERM GOAL #2   Title Patient will report worst pain in low back no greater than 5/10(12/18/15)   Status On-going   PT  LONG TERM GOAL #3   Title Patient will tolerate standing for at least 20 minutes without increased low back pain to improve abillity to complete household chores (12/18/15)   Status On-going   PT LONG TERM GOAL #4   Title Patient will report ability to walk at least 1 mile without increased low back pain (12/18/15)   Status On-going               Plan - 11/12/15 1804    Clinical Impression Statement Patient progressing well with PT POC typically presenting to recent therapy visits w/o pain. Continues to report increased back pain when walking longer distances but able to mitigate pain by stopping to stretch, typically reaching forward toward toes, allowing her to continue until she reaches a point where she can sit down.   PT Next Visit Plan Lumbar/core strengthening and stability exercises, mechanical traction, manual therapy & modalities PRN   Consulted and Agree with Plan of Care Patient        Problem List Patient Active Problem List   Diagnosis Date Noted  . Right shoulder pain 07/11/2015  . LUQ pain 04/10/2015  . Vaginal discharge 02/15/2015  . UTI symptoms 01/15/2015  . Bacterial vaginitis 12/24/2014  . Exposure to STD 06/11/2013  . Headache(784.0) 11/12/2012  . Hypokalemia 11/10/2012  . TIA (transient ischemic attack) 11/09/2012  . Nerve compression 05/20/2012  . GERD (gastroesophageal reflux disease)   . Herpes genitalia   . H/O: myomectomy   . Fibroid uterus 03/17/2012  . PULMONARY HYPERTENSION 11/06/2010  . Morbid obesity ([place]) 12/04/2008  . Essential hypertension 10/22/2007  . HPV 03/24/2007  . LOSS, HEARING NOS 03/24/2007  . ASTHMA 03/24/2007    Percival Spanish, PT, MPT 11/12/2015, 6:29 PM  [facility] [address]  [place], [place], [address] Phone: [phone]   Fax:  [phone]  Name: EMIRAH LOZOWSKI MRN: [id number] Date of Birth: [birth year]

## 2015-11-15 ENCOUNTER — Ambulatory Visit: Payer: 59 | Admitting: Physical Therapy

## 2015-11-15 DIAGNOSIS — R262 Difficulty in walking, not elsewhere classified: Secondary | ICD-10-CM

## 2015-11-15 DIAGNOSIS — M5440 Lumbago with sciatica, unspecified side: Secondary | ICD-10-CM | POA: Diagnosis not present

## 2015-11-15 NOTE — Therapy (Signed)
[facility] [address]  [place], [place], [address] Phone: [phone]   Fax:  [phone]  Physical Therapy Treatment  Patient Details  Name: [name] MRN: [id number] Date of Birth: 08-01-1971 Referring Provider: Garnet Koyanagi, DO  Encounter Date: 11/15/2015      PT End of Session - 11/15/15 1818    Visit Number 8   Number of Visits 12   Date for PT Re-Evaluation 12/18/15   PT Start Time [id number]   PT Stop Time 1800   PT Time Calculation (min) 53 min      Past Medical History  Diagnosis Date  . Allergy   . Asthma   . Hypertension     pulmonary  . Herpes genitalia   . H/O: myomectomy   . GERD (gastroesophageal reflux disease)     no meds  . Heart murmur   . TIA (transient ischemic attack)   . Refusal of blood transfusions as patient is Jehovah's Witness   . Hearing impaired     can hear on left side only    Past Surgical History  Procedure Laterality Date  . Myomectomy  2004      2010 robotic  . Ovarian cyst removal  2010    x2  . Breast lumpectomy  01/06/11  . Robotic assisted lap vaginal hysterectomy    . Cystoscopy  05/19/2012    Procedure: CYSTOSCOPY;  Surgeon: Alwyn Pea, MD;  Location: [facility];  Service: Gynecology;  Laterality: N/A;  . Robotic assisted laparoscopic ovarian cystectomy Right 03/20/2015    Procedure: ROBOTIC ASSISTED LAPAROSCOPIC Salpingectomy OVARIAN CYSTECTOMY, pelvic washings, excision of peritoneal pseudocyst.;  Surgeon: Delsa Bern, MD;  Location: [facility];  Service: Gynecology;  Laterality: Right;    There were no vitals filed for this visit.  Visit Diagnosis:  Midline low back pain with sciatica, sciatica laterality unspecified  Difficulty walking      Subjective Assessment - 11/15/15 1710    Subjective States has been pain-free past 2 days and last noted pain on Tuesday while walking.  States noted onset of pain within 5 minutes of walking.   Currently  in Pain? No/denies        TODAY'S TREATMENT TherEx - SKTC (and Thomas) Supine Mod Thomas hip flexor stretch Side-lying RF stretch Pelvic Tilt 10x3" Staggered Standing one-arm punch diagonal Blue TB 10x each with focus on abdominal control and no increased lumbar lordosis (notes mild LBP with this which is improved with standing trunk flexion) Knee Flexion / Hamstring Machine 25# 2x10 Standing Pullover on Lat Pulldown 10# 12x (focus on no increased lumbar lordosis)  Mechanical Traction - L-spine, prone, neutral pull, 76#/38#, 60"/20", 15'                PT Short Term Goals - 10/23/15 1808    PT SHORT TERM GOAL #1   Title Independent with initial HEP (10/09/15)   Status Achieved           PT Long Term Goals - 11/12/15 1818    PT LONG TERM GOAL #1   Title Independent with advanced HEP (12/18/15)   Status On-going   PT LONG TERM GOAL #2   Title Patient will report worst pain in low back no greater than 5/10(12/18/15)   Status On-going   PT LONG TERM GOAL #3   Title Patient will tolerate standing for at least 20 minutes without increased low back pain to improve abillity to complete  household chores (12/18/15)   Status On-going   PT LONG TERM GOAL #4   Title Patient will report ability to walk at least 1 mile without increased low back pain (12/18/15)   Status On-going               Plan - 11/15/15 1802    Clinical Impression Statement Pt is improving with regard to frequency of pain but walking tolerance still quite limited.  She notes onset of back pain within 5 minutes of walking.  This pain is most likely associated with exceptional hip flexor and RF tightness coupled with multiple abdominal surgies resulting in weakened abdominals causing facet impingement in lower l-spine.  She notes benefit with forward bending which coincides with facet impingement.  Continued POC with focus on hip flexor and RF pliability along with HS and abdominal strengthening  should continue to benefit pt.   PT Next Visit Plan Lumbar/core strengthening and stability exercises, mechanical traction, manual therapy & modalities PRN   Consulted and Agree with Plan of Care Patient        Problem List Patient Active Problem List   Diagnosis Date Noted  . Right shoulder pain 07/11/2015  . LUQ pain 04/10/2015  . Vaginal discharge 02/15/2015  . UTI symptoms 01/15/2015  . Bacterial vaginitis 12/24/2014  . Exposure to STD 06/11/2013  . Headache(784.0) 11/12/2012  . Hypokalemia 11/10/2012  . TIA (transient ischemic attack) 11/09/2012  . Nerve compression 05/20/2012  . GERD (gastroesophageal reflux disease)   . Herpes genitalia   . H/O: myomectomy   . Fibroid uterus 03/17/2012  . PULMONARY HYPERTENSION 11/06/2010  . Morbid obesity ([place]) 12/04/2008  . Essential hypertension 10/22/2007  . HPV 03/24/2007  . LOSS, HEARING NOS 03/24/2007  . ASTHMA 03/24/2007    Apollonia Amini PT, OCS 11/15/2015, 6:19 PM  [facility] [address]  [place], [place], [address] Phone: [phone]   Fax:  [phone]  Name: LAURIE JEPPSEN MRN: [id number] Date of Birth: 05-Nov-1971

## 2015-11-20 ENCOUNTER — Ambulatory Visit: Payer: 59 | Admitting: Physical Therapy

## 2015-11-20 ENCOUNTER — Ambulatory Visit (INDEPENDENT_AMBULATORY_CARE_PROVIDER_SITE_OTHER): Payer: 59 | Admitting: Family Medicine

## 2015-11-20 ENCOUNTER — Encounter: Payer: Self-pay | Admitting: Family Medicine

## 2015-11-20 VITALS — BP 138/82 | HR 91 | Temp 98.2°F | Ht 63.0 in | Wt 201.6 lb

## 2015-11-20 DIAGNOSIS — M5440 Lumbago with sciatica, unspecified side: Secondary | ICD-10-CM

## 2015-11-20 DIAGNOSIS — M25561 Pain in right knee: Secondary | ICD-10-CM | POA: Insufficient documentation

## 2015-11-20 DIAGNOSIS — R262 Difficulty in walking, not elsewhere classified: Secondary | ICD-10-CM

## 2015-11-20 NOTE — Assessment & Plan Note (Signed)
Check  Xray Ice, elevate Sleeve IB

## 2015-11-20 NOTE — Progress Notes (Signed)
Subjective:    Patient ID: [name], female    DOB: 1970/11/25, 44 y.o.   MRN: [id number]  Chief Complaint  Patient presents with  . Joint Swelling    c/o right knee swelling x's 1 week    HPI Patient is in today for R knee pain.  Pt can't remember an injury.  It hurts to go down steps.   She states her knee did give out in zumba last week.    Past Medical History  Diagnosis Date  . Allergy   . Asthma   . Hypertension     pulmonary  . Herpes genitalia   . H/O: myomectomy   . GERD (gastroesophageal reflux disease)     no meds  . Heart murmur   . TIA (transient ischemic attack)   . Refusal of blood transfusions as patient is Jehovah's Witness   . Hearing impaired     can hear on left side only    Past Surgical History  Procedure Laterality Date  . Myomectomy  2004      2010 robotic  . Ovarian cyst removal  2010    x2  . Breast lumpectomy  01/06/11  . Robotic assisted lap vaginal hysterectomy    . Cystoscopy  05/19/2012    Procedure: CYSTOSCOPY;  Surgeon: Alwyn Pea, MD;  Location: [facility];  Service: Gynecology;  Laterality: N/A;  . Robotic assisted laparoscopic ovarian cystectomy Right 03/20/2015    Procedure: ROBOTIC ASSISTED LAPAROSCOPIC Salpingectomy OVARIAN CYSTECTOMY, pelvic washings, excision of peritoneal pseudocyst.;  Surgeon: Delsa Bern, MD;  Location: [facility];  Service: Gynecology;  Laterality: Right;    Family History  Problem Relation Age of Onset  . Asthma Mother   . Arthritis Mother   . Hypertension Father   . Heart disease Maternal Grandmother     PCI  . Cancer Maternal Grandmother 35    breast  . Cancer Other 60    breast and possible colon  . Diabetes Brother   . Migraines Sister     Social History   Social History  . Marital Status: Single    Spouse Name: N/A  . Number of Children: N/A  . Years of Education: N/A   Occupational History  . Not on file.   Social History Main Topics  . Smoking status: Never Smoker   .  Smokeless tobacco: Never Used  . Alcohol Use: No  . Drug Use: No  . Sexual Activity:    Partners: Male    Birth Control/ Protection: Surgical     Comment: hyst   Other Topics Concern  . Not on file   Social History Narrative   Exercise---walk, zumba, [employer] video    Outpatient Prescriptions Prior to Visit  Medication Sig Dispense Refill  . albuterol (PROVENTIL HFA;VENTOLIN HFA) 108 (90 BASE) MCG/ACT inhaler Inhale 2 puffs into the lungs every 6 (six) hours as needed for wheezing. 1 Inhaler 2  . Ascorbic Acid (VITAMIN C) 1000 MG tablet Take 1,000 mg by mouth daily.    Marland Kitchen aspirin 325 MG tablet Take 1 tablet (325 mg total) by mouth daily.    . cetirizine (ZYRTEC) 10 MG tablet Take 10 mg by mouth daily.    . cholecalciferol (VITAMIN D) 1000 UNITS tablet Take 1,000 Units by mouth daily.    . cyclobenzaprine (FLEXERIL) 10 MG tablet Take 1 tablet (10 mg total) by mouth 3 (three) times daily as needed for muscle spasms. 30 tablet 0  . ibuprofen (  ADVIL,MOTRIN) 600 MG tablet Take 600 mg by mouth every 6 (six) hours as needed.    Marland Kitchen lisinopril (PRINIVIL,ZESTRIL) 20 MG tablet Take 1 tablet (20 mg total) by mouth daily. 90 tablet 3  . meloxicam (MOBIC) 15 MG tablet Take 1 tablet (15 mg total) by mouth daily. 30 tablet 0  . metoprolol (LOPRESSOR) 100 MG tablet Take 1 tablet (100 mg total) by mouth 2 (two) times daily. 180 tablet 3  . Multiple Vitamins-Calcium (ONE-A-DAY WOMENS PO) Take 1 tablet by mouth daily.    . nitroGLYCERIN (MINITRAN) 0.2 mg/hr patch Apply 1/4th patch to affected shoulder, change daily 30 patch 1  . spironolactone (ALDACTONE) 25 MG tablet Take 1 tablet (25 mg total) by mouth daily. 90 tablet 3  . valACYclovir (VALTREX) 500 MG tablet Take 500 mg by mouth daily.      No facility-administered medications prior to visit.    No Known Allergies  Review of Systems  Constitutional: Negative for fever and chills.  Musculoskeletal: Positive for joint pain.    Psychiatric/Behavioral: Negative for depression. The patient is not nervous/anxious.        Objective:    Physical Exam  Constitutional: She is oriented to person, place, and time. She appears well-developed and well-nourished.  HENT:  Head: Normocephalic and atraumatic.  Eyes: Conjunctivae and EOM are normal.  Neck: Normal range of motion. Neck supple. No JVD present. Carotid bruit is not present. No thyromegaly present.  Cardiovascular: Normal rate, regular rhythm and normal heart sounds.   No murmur heard. Pulmonary/Chest: Effort normal and breath sounds normal. No respiratory distress. She has no wheezes. She has no rales. She exhibits no tenderness.  Musculoskeletal: She exhibits edema and tenderness.       Right knee: She exhibits decreased range of motion and swelling.  Neurological: She is alert and oriented to person, place, and time.  Psychiatric: She has a normal mood and affect.    BP 138/82 mmHg  Pulse 91  Temp(Src) 98.2 F (36.8 C) (Oral)  Ht 5\' 3"  (1.6 m)  Wt 201 lb 9.6 oz (91.445 kg)  BMI 35.72 kg/m2  SpO2 98%  LMP 04/18/2012 Wt Readings from Last 3 Encounters:  11/20/15 201 lb 9.6 oz (91.445 kg)  10/02/15 190 lb (86.183 kg)  08/21/15 197 lb (89.359 kg)     Lab Results  Component Value Date   WBC 9.9 04/02/2015   HGB 12.8 04/02/2015   HCT 38.4 04/02/2015   PLT 313 04/02/2015   GLUCOSE 98 04/02/2015   CHOL 168 03/06/2015   TRIG 79.0 03/06/2015   HDL 60.10 03/06/2015   LDLCALC 92 03/06/2015   ALT 18 04/02/2015   AST 17 04/02/2015   NA 139 04/02/2015   K 4.0 04/02/2015   CL 106 04/02/2015   CREATININE 0.79 04/02/2015   BUN 8 04/02/2015   CO2 27 04/02/2015   TSH 0.56 03/06/2015   INR 1.11 11/09/2012   HGBA1C 5.1 11/10/2012    Lab Results  Component Value Date   TSH 0.56 03/06/2015   Lab Results  Component Value Date   WBC 9.9 04/02/2015   HGB 12.8 04/02/2015   HCT 38.4 04/02/2015   MCV 89.1 04/02/2015   PLT 313 04/02/2015   Lab  Results  Component Value Date   NA 139 04/02/2015   K 4.0 04/02/2015   CO2 27 04/02/2015   GLUCOSE 98 04/02/2015   BUN 8 04/02/2015   CREATININE 0.79 04/02/2015   BILITOT 0.9 04/02/2015   ALKPHOS  77 04/02/2015   AST 17 04/02/2015   ALT 18 04/02/2015   PROT 7.6 04/02/2015   ALBUMIN 4.0 04/02/2015   CALCIUM 9.7 04/02/2015   ANIONGAP 6 04/02/2015   GFR 99.07 03/06/2015   Lab Results  Component Value Date   CHOL 168 03/06/2015   Lab Results  Component Value Date   HDL 60.10 03/06/2015   Lab Results  Component Value Date   LDLCALC 92 03/06/2015   Lab Results  Component Value Date   TRIG 79.0 03/06/2015   Lab Results  Component Value Date   CHOLHDL 3 03/06/2015   Lab Results  Component Value Date   HGBA1C 5.1 11/10/2012       Assessment & Plan:   Problem List Items Addressed This Visit      Unprioritized   Knee pain, right - Primary    Check  Xray Ice, elevate Sleeve IB      Relevant Orders   DG Knee Complete 4 Views Right      I am having Ms. Fitz maintain her Multiple Vitamins-Calcium (ONE-A-DAY WOMENS PO), aspirin, valACYclovir, albuterol, vitamin C, lisinopril, metoprolol, spironolactone, cetirizine, cholecalciferol, ibuprofen, meloxicam, cyclobenzaprine, and nitroGLYCERIN.  No orders of the defined types were placed in this encounter.     Garnet Koyanagi, DO

## 2015-11-20 NOTE — Progress Notes (Signed)
Pre visit review using our clinic review tool, if applicable. No additional management support is needed unless otherwise documented below in the visit note. 

## 2015-11-20 NOTE — Patient Instructions (Signed)

## 2015-11-20 NOTE — Therapy (Addendum)
[facility] [address]  [place], [place], [address] Phone: [phone]   Fax:  [phone]  Physical Therapy Treatment  Patient Details  Name: [name] MRN: [id number] Date of Birth: 1970/12/13 Referring Provider: Garnet Koyanagi, DO  Encounter Date: 11/20/2015      PT End of Session - 11/20/15 1708    Visit Number 9   Number of Visits 12   Date for PT Re-Evaluation 12/18/15   PT Start Time 1701   PT Stop Time 1809   PT Time Calculation (min) 68 min   Activity Tolerance Patient tolerated treatment well   Behavior During Therapy [facility] for tasks assessed/performed      Past Medical History  Diagnosis Date  . Allergy   . Asthma   . Hypertension     pulmonary  . Herpes genitalia   . H/O: myomectomy   . GERD (gastroesophageal reflux disease)     no meds  . Heart murmur   . TIA (transient ischemic attack)   . Refusal of blood transfusions as patient is Jehovah's Witness   . Hearing impaired     can hear on left side only    Past Surgical History  Procedure Laterality Date  . Myomectomy  2004      2010 robotic  . Ovarian cyst removal  2010    x2  . Breast lumpectomy  01/06/11  . Robotic assisted lap vaginal hysterectomy    . Cystoscopy  05/19/2012    Procedure: CYSTOSCOPY;  Surgeon: Alwyn Pea, MD;  Location: [facility];  Service: Gynecology;  Laterality: N/A;  . Robotic assisted laparoscopic ovarian cystectomy Right 03/20/2015    Procedure: ROBOTIC ASSISTED LAPAROSCOPIC Salpingectomy OVARIAN CYSTECTOMY, pelvic washings, excision of peritoneal pseudocyst.;  Surgeon: Delsa Bern, MD;  Location: [facility];  Service: Gynecology;  Laterality: Right;    There were no vitals filed for this visit.  Visit Diagnosis:  Midline low back pain with sciatica, sciatica laterality unspecified  Difficulty walking      Subjective Assessment - 11/20/15 1705    Subjective States no pain other than when wearing  high heels at which time pain was up to 5/10. Has not attempted any walking recently but worked out at gym yesterday without pain.   Currently in Pain? No/denies   Pain Score --  5/10 when wearing high heels           TODAY'S TREATMENT  TherEx SKTC (and Thomas) Supine Mod Thomas hip flexor stretch 3x20" Side-lying RF stretch 3x20" Pelvic Tilt 10x3" Bridge + TrA with alternating knee extension x10 Abdominal bracing with eccentric SLR x10 Knee Flexion / Hamstring Machine 25# 2x15 Standing Pullover on Lat Pulldown 10# 15x (focus on no increased lumbar lordosis) Staggered Standing one-arm punch diagonal Blue TB x10 each with focus on abdominal control and no increased lumbar lordosis  Staggered Standing one-arm row with Blue TB x10 each   Mechanical Traction - L-spine, prone, neutral pull, 76#/38#, 60"/20", 15'           PT Education - 11/20/15 1826    Education provided Yes   Education Details Final HEP with instructions for self-progression   Person(s) Educated Patient   Methods Explanation;Demonstration;Handout   Comprehension Verbalized understanding;Returned demonstration          PT Short Term Goals - 10/23/15 1808    PT SHORT TERM GOAL #1   Title Independent with initial HEP (10/09/15)   Status Achieved  PT Long Term Goals - 11/20/15 1822    PT LONG TERM GOAL #1   Title Independent with advanced HEP (12/18/15)   Status Achieved   PT LONG TERM GOAL #2   Title Patient will report worst pain in low back no greater than 5/10(12/18/15)   Status Achieved   PT LONG TERM GOAL #3   Title Patient will tolerate standing for at least 20 minutes without increased low back pain to improve abillity to complete household chores (12/18/15)   Status On-going   PT LONG TERM GOAL #4   Title Patient will report ability to walk at least 1 mile without increased low back pain (12/18/15)   Status On-going               Plan - 11/20/15 1812     Clinical Impression Statement Patient without pain since last visit except when wearing high heel shoes, but has been "taking it easy over the holidays" and has not tried walking for exercise. Was able to work out at the gym without any pain and demonstrated good tolerance for therapy today. At end of session patient notified PT that she would like to try transitioning to HEP as her deductible will be resetting as of the new year and asked for instructions on how to continue to progress HEP and gym program. Updated HEP provided based on exercises previously performed during therapy sessions with instructions on how and when to progress exercises. Patient will be placed on hold for 30 days in the event that she has problems with the transition to the HEP, at which time she will call to schedule an appointment. If no further need arises within 30 days, will proceed with discharge.   PT Next Visit Plan 30 day hold   Consulted and Agree with Plan of Care Patient        Problem List Patient Active Problem List   Diagnosis Date Noted  . Knee pain, right 11/20/2015  . Right shoulder pain 07/11/2015  . LUQ pain 04/10/2015  . Vaginal discharge 02/15/2015  . UTI symptoms 01/15/2015  . Bacterial vaginitis 12/24/2014  . Exposure to STD 06/11/2013  . Headache(784.0) 11/12/2012  . Hypokalemia 11/10/2012  . TIA (transient ischemic attack) 11/09/2012  . Nerve compression 05/20/2012  . GERD (gastroesophageal reflux disease)   . Herpes genitalia   . H/O: myomectomy   . Fibroid uterus 03/17/2012  . PULMONARY HYPERTENSION 11/06/2010  . Morbid obesity ([place]) 12/04/2008  . Essential hypertension 10/22/2007  . HPV 03/24/2007  . LOSS, HEARING NOS 03/24/2007  . ASTHMA 03/24/2007    Percival Spanish, PT ,MPT 11/20/2015, 6:28 PM  [facility] [address]  [place], [place], [address] Phone: [phone]   Fax:  [phone]  Name: [name] MRN: [id number] Date of Birth: Dec 16, 1970   PHYSICAL THERAPY DISCHARGE SUMMARY  Visits from Start of Care: 9  Current functional level related to goals / functional outcomes:   As of last PT visit, patient had been without pain since last visit except when wearing high heel shoes, but had been "taking it easy over the holidays" and had not tried walking for exercise. Was able to work out at the gym without any pain and demonstrated good tolerance for therapy. At end of session patient notified PT that she would like to try transitioning to HEP as her deductible will be resetting as of the new year and asked for instructions on  how to continue to progress HEP and gym program. Updated HEP provided based on exercises previously performed during therapy sessions with instructions on how and when to progress exercises. Patient was placed on hold for 30 days in the event that she has problems with the transition to the HEP, at which time she will call to schedule an appointment. Patient has not needed to return within 30 days, therefore will proceed with discharge from PT for this episode.   Remaining deficits:  None   Education / Equipment:  HEP  Plan: Patient agrees to discharge.  Patient goals were partially met. Patient is being discharged due to being pleased with the current functional level.  ?????       Percival Spanish, PT, MPT 12/21/2015, 8:40 AM  [facility] [address]  [place], [place], [address] Phone: [phone]   Fax:  [phone]

## 2015-11-21 ENCOUNTER — Ambulatory Visit (HOSPITAL_BASED_OUTPATIENT_CLINIC_OR_DEPARTMENT_OTHER)
Admission: RE | Admit: 2015-11-21 | Discharge: 2015-11-21 | Disposition: A | Discharge status: Home / Self Care | Payer: Managed Care, Other (non HMO) | Source: Ambulatory Visit | Attending: Family Medicine | Admitting: Family Medicine

## 2015-11-21 DIAGNOSIS — M25561 Pain in right knee: Secondary | ICD-10-CM | POA: Diagnosis present

## 2016-01-04 ENCOUNTER — Observation Stay (HOSPITAL_BASED_OUTPATIENT_CLINIC_OR_DEPARTMENT_OTHER)
Admission: EM | Admit: 2016-01-04 | Discharge: 2016-01-05 | Disposition: A | Discharge status: Home / Self Care | Payer: Managed Care, Other (non HMO) | Attending: Internal Medicine | Admitting: Internal Medicine

## 2016-01-04 ENCOUNTER — Telehealth: Payer: Self-pay | Admitting: Family Medicine

## 2016-01-04 ENCOUNTER — Encounter (HOSPITAL_BASED_OUTPATIENT_CLINIC_OR_DEPARTMENT_OTHER): Payer: Self-pay | Admitting: *Deleted

## 2016-01-04 ENCOUNTER — Emergency Department (HOSPITAL_BASED_OUTPATIENT_CLINIC_OR_DEPARTMENT_OTHER): Payer: Managed Care, Other (non HMO)

## 2016-01-04 DIAGNOSIS — I10 Essential (primary) hypertension: Secondary | ICD-10-CM | POA: Diagnosis present

## 2016-01-04 DIAGNOSIS — R079 Chest pain, unspecified: Secondary | ICD-10-CM | POA: Diagnosis not present

## 2016-01-04 LAB — PREGNANCY, URINE: Preg Test, Ur: NEGATIVE

## 2016-01-04 LAB — COMPREHENSIVE METABOLIC PANEL
ALK PHOS: 62 U/L (ref 38–126)
ALT: 21 U/L (ref 14–54)
AST: 21 U/L (ref 15–41)
Albumin: 3.8 g/dL (ref 3.5–5.0)
Anion gap: 9 (ref 5–15)
BILIRUBIN TOTAL: 0.5 mg/dL (ref 0.3–1.2)
BUN: 12 mg/dL (ref 6–20)
CALCIUM: 9.4 mg/dL (ref 8.9–10.3)
CO2: 28 mmol/L (ref 22–32)
Chloride: 102 mmol/L (ref 101–111)
Creatinine, Ser: 0.9 mg/dL (ref 0.44–1.00)
GFR calc Af Amer: >60 mL/min (ref >60)
Glucose, Bld: 107 mg/dL — ABNORMAL HIGH (ref 65–99)
POTASSIUM: 4 mmol/L (ref 3.5–5.1)
Sodium: 139 mmol/L (ref 135–145)
TOTAL PROTEIN: 7.1 g/dL (ref 6.5–8.1)

## 2016-01-04 LAB — CBC WITH DIFFERENTIAL/PLATELET
BASOS ABS: 0 10*3/uL (ref 0.0–0.1)
Basophils Relative: 0 %
Eosinophils Absolute: 0.3 10*3/uL (ref 0.0–0.7)
Eosinophils Relative: 3 %
HEMATOCRIT: 38.2 % (ref 36.0–46.0)
HEMOGLOBIN: 12.4 g/dL (ref 12.0–15.0)
LYMPHS PCT: 47 %
Lymphs Abs: 4.3 10*3/uL — ABNORMAL HIGH (ref 0.7–4.0)
MCH: 29.7 pg (ref 26.0–34.0)
MCHC: 32.5 g/dL (ref 30.0–36.0)
MCV: 91.4 fL (ref 78.0–100.0)
MONO ABS: 0.7 10*3/uL (ref 0.1–1.0)
MONOS PCT: 8 %
NEUTROS ABS: 3.9 10*3/uL (ref 1.7–7.7)
Neutrophils Relative %: 42 %
Platelets: 323 10*3/uL (ref 150–400)
RBC: 4.18 MIL/uL (ref 3.87–5.11)
RDW: 12.7 % (ref 11.5–15.5)
WBC: 9.3 10*3/uL (ref 4.0–10.5)

## 2016-01-04 LAB — URINALYSIS, ROUTINE W REFLEX MICROSCOPIC
BILIRUBIN URINE: NEGATIVE
GLUCOSE, UA: NEGATIVE mg/dL
HGB URINE DIPSTICK: NEGATIVE
Ketones, ur: NEGATIVE mg/dL
Nitrite: NEGATIVE
PROTEIN: NEGATIVE mg/dL
Specific Gravity, Urine: 1.022 (ref 1.005–1.030)
pH: 6 (ref 5.0–8.0)

## 2016-01-04 LAB — URINE MICROSCOPIC-ADD ON: RBC / HPF: NONE SEEN RBC/hpf (ref 0–5)

## 2016-01-04 LAB — D-DIMER, QUANTITATIVE: D-Dimer, Quant: 0.44 ug/mL-FEU (ref 0.00–0.50)

## 2016-01-04 LAB — TROPONIN I: Troponin I: <0.03 ng/mL (ref <0.031)

## 2016-01-04 LAB — LIPASE, BLOOD: LIPASE: 32 U/L (ref 11–51)

## 2016-01-04 MED ORDER — CYCLOBENZAPRINE HCL 10 MG PO TABS: 10.0000 mg | ORAL_TABLET | Freq: Three times a day (TID) | ORAL | Status: DC | PRN

## 2016-01-04 MED ORDER — GI COCKTAIL ~~LOC~~
30.0000 mL | Freq: Once | ORAL | Status: AC
  Administered 2016-01-04: 30 mL via ORAL
  Filled 2016-01-04: qty 30

## 2016-01-04 MED ORDER — LISINOPRIL 20 MG PO TABS
20.0000 mg | ORAL_TABLET | Freq: Every day | ORAL | Status: DC
  Administered 2016-01-05: 20 mg via ORAL
  Filled 2016-01-04: qty 1

## 2016-01-04 MED ORDER — ASPIRIN 325 MG PO TABS
325.0000 mg | ORAL_TABLET | Freq: Every day | ORAL | Status: DC
  Administered 2016-01-05: 325 mg via ORAL
  Filled 2016-01-04: qty 1

## 2016-01-04 MED ORDER — SPIRONOLACTONE 25 MG PO TABS
25.0000 mg | ORAL_TABLET | Freq: Every day | ORAL | Status: DC
Start: 2016-01-05 — End: 2016-01-05
  Administered 2016-01-05: 25 mg via ORAL
  Filled 2016-01-04: qty 1

## 2016-01-04 MED ORDER — SODIUM CHLORIDE 0.9 % IV BOLUS (SEPSIS)
1000.0000 mL | Freq: Once | INTRAVENOUS | Status: AC
  Administered 2016-01-04: 1000 mL via INTRAVENOUS

## 2016-01-04 MED ORDER — ASPIRIN 81 MG PO CHEW
324.0000 mg | CHEWABLE_TABLET | Freq: Once | ORAL | Status: DC
  Filled 2016-01-04: qty 4

## 2016-01-04 MED ORDER — ALBUTEROL SULFATE (2.5 MG/3ML) 0.083% IN NEBU: 2.5000 mg | INHALATION_SOLUTION | Freq: Four times a day (QID) | RESPIRATORY_TRACT | Status: DC | PRN

## 2016-01-04 MED ORDER — METOPROLOL TARTRATE 100 MG PO TABS
100.0000 mg | ORAL_TABLET | Freq: Two times a day (BID) | ORAL | Status: DC
  Administered 2016-01-05: 100 mg via ORAL
  Filled 2016-01-04 (×2): qty 1

## 2016-01-04 MED ORDER — ONDANSETRON HCL 4 MG/2ML IJ SOLN
4.0000 mg | Freq: Once | INTRAMUSCULAR | Status: AC
  Administered 2016-01-04: 4 mg via INTRAVENOUS
  Filled 2016-01-04: qty 2

## 2016-01-04 NOTE — ED Notes (Signed)
Chest pain that feels like some one is jumping up and down on her chest causing her to have SOB.

## 2016-01-04 NOTE — Progress Notes (Signed)
Transfer from [facility] per Dr. Wyvonnia Dusky  45 year old lady with history of myomectomy, hypertension, asthma, GERD, TIA, right hearing loss, who presents with chest pain.  Patient reports that she has been having intermittent central located chest pain in the past 2 days. Each time the CP lasted for a few minutes. It is associated with shortness of breath and diaphoresis. It is radiating to back and neck. Patient also had 2 episodes of nausea and vomiting. Of note, patient had similar chest pain in the past. She reported that she had holter monitor, echo and right heart cath in the past that were all normal. Currently CP free.   In ED, patient was found to have negative d-dimer, negative troponin, lipase 32, negative pregnancy test, urinalysis with small amount of leukocytes, WBC 9.3, temperature normal, no tachycardia, electrolytes and renal function okay, negative chest x-ray for acute abnormalities. EKG showed QTC 411 with T-wave inversion only in lead 3. Pt is accepted to tele for observation.   Ivor Costa, MD  [facility] Pager [phone]  If 7PM-7AM, please contact night-coverage [email or web address] Password [facility] 01/04/2016, 9:14 PM

## 2016-01-04 NOTE — ED Provider Notes (Signed)
CSN: [id number]     Arrival date & time 01/04/16  1901 History  By signing my name below, I, [name], attest that this documentation has been prepared under the direction and in the presence of Dawn Essex, MD. Electronically Signed: Julien Hogan, ED Scribe. 01/04/2016. 7:31 PM.    Chief Complaint  Patient presents with  . Chest Pain       The history is provided by the patient. No language interpreter was used.   HPI Comments: [name] is a 45 y.o. female who has a hx of HTN, heart murmur, and GERD presents to the Emergency Department complaining of intermittent, gradual worsening central chest pain that radiates to her neck, left arm, and back onset this morning. She reports not feeling well over the past two days. She reports feeling short of breath, hot, sweaty, lightheadedness, and vomiting with each episode. She has vomited twice today. Pt notes the chest pain comes on at rest and on exertion. She has a hx of heart problems which she has seen a cardiologist for over the years and has had multiple work ups done. Pt says that she has had this pain before in the past but the only new thing that is abnormal is her vomiting. Pt called her PCP and was told to come to the ED evaluation. Denies fever, hx of heart attack, stents in her heart, and abdominal pain.   Pt was seen in 2011 by Dr. Benjamine Mola for syncope and chest heaviness. She had a normal cardiogram of 55-60 %. Pt wore an event monitor for one week that showed no arrhythmia. She was thought to have pulmonary HTN and underwent right catheterization that was normal. No stress test or LHC seen.  Past Medical History  Diagnosis Date  . Allergy   . Asthma   . Hypertension     pulmonary  . Herpes genitalia   . H/O: myomectomy   . GERD (gastroesophageal reflux disease)     no meds  . Heart murmur   . TIA (transient ischemic attack)   . Refusal of blood transfusions as patient is Jehovah's Witness   . Hearing impaired      can hear on left side only   Past Surgical History  Procedure Laterality Date  . Myomectomy  2004      2010 robotic  . Ovarian cyst removal  2010    x2  . Breast lumpectomy  01/06/11  . Robotic assisted lap vaginal hysterectomy    . Cystoscopy  05/19/2012    Procedure: CYSTOSCOPY;  Surgeon: Alwyn Pea, MD;  Location: [facility];  Service: Gynecology;  Laterality: N/A;  . Robotic assisted laparoscopic ovarian cystectomy Right 03/20/2015    Procedure: ROBOTIC ASSISTED LAPAROSCOPIC Salpingectomy OVARIAN CYSTECTOMY, pelvic washings, excision of peritoneal pseudocyst.;  Surgeon: Delsa Bern, MD;  Location: [facility];  Service: Gynecology;  Laterality: Right;   Family History  Problem Relation Age of Onset  . Asthma Mother   . Arthritis Mother   . Hypertension Father   . Heart disease Maternal Grandmother     PCI  . Cancer Maternal Grandmother 79    breast  . Cancer Other 60    breast and possible colon  . Diabetes Brother   . Migraines Sister    Social History  Substance Use Topics  . Smoking status: Never Smoker   . Smokeless tobacco: Never Used  . Alcohol Use: No   OB History    Gravida Para Term Preterm AB  TAB SAB Ectopic Multiple Living   1 1        1      Review of Systems  A complete 10 system review of systems was obtained and all systems are negative except as noted in the HPI and PMH.    Allergies  Review of patient's allergies indicates no known allergies.  Home Medications   Prior to Admission medications   Medication Sig Start Date End Date Taking? Authorizing Provider  albuterol (PROVENTIL HFA;VENTOLIN HFA) 108 (90 BASE) MCG/ACT inhaler Inhale 2 puffs into the lungs every 6 (six) hours as needed for wheezing. 06/09/13   Rosalita Chessman, DO  Ascorbic Acid (VITAMIN C) 1000 MG tablet Take 1,000 mg by mouth daily.    Historical Provider, MD  aspirin 325 MG tablet Take 1 tablet (325 mg total) by mouth daily. 11/10/12   Erline Hau, MD  cetirizine  (ZYRTEC) 10 MG tablet Take 10 mg by mouth daily.    Historical Provider, MD  cholecalciferol (VITAMIN D) 1000 UNITS tablet Take 1,000 Units by mouth daily.    Historical Provider, MD  cyclobenzaprine (FLEXERIL) 10 MG tablet Take 1 tablet (10 mg total) by mouth 3 (three) times daily as needed for muscle spasms. 07/03/15   Rosalita Chessman, DO  ibuprofen (ADVIL,MOTRIN) 600 MG tablet Take 600 mg by mouth every 6 (six) hours as needed.    Historical Provider, MD  lisinopril (PRINIVIL,ZESTRIL) 20 MG tablet Take 1 tablet (20 mg total) by mouth daily. 03/06/15   Rosalita Chessman, DO  meloxicam (MOBIC) 15 MG tablet Take 1 tablet (15 mg total) by mouth daily. 07/03/15   Rosalita Chessman, DO  metoprolol (LOPRESSOR) 100 MG tablet Take 1 tablet (100 mg total) by mouth 2 (two) times daily. 03/06/15   Rosalita Chessman, DO  Multiple Vitamins-Calcium (ONE-A-DAY WOMENS PO) Take 1 tablet by mouth daily.    Historical Provider, MD  nitroGLYCERIN [facility]) 0.2 mg/hr patch Apply 1/4th patch to affected shoulder, change daily 08/21/15   Dene Gentry, MD  spironolactone (ALDACTONE) 25 MG tablet Take 1 tablet (25 mg total) by mouth daily. 03/06/15   Rosalita Chessman, DO  valACYclovir (VALTREX) 500 MG tablet Take 500 mg by mouth daily.  06/02/13   Historical Provider, MD   Triage vitals: Pulse 78  Temp(Src) 98.4 F (36.9 C) (Oral)  Resp 18  Ht 5\' 3"  (1.6 m)  Wt 200 lb (90.719 kg)  BMI 35.44 kg/m2  SpO2 100%  LMP 04/18/2012 Physical Exam  Constitutional: She is oriented to person, place, and time. She appears well-developed and well-nourished. No distress.  Slightly anxious  HENT:  Head: Normocephalic and atraumatic.  Mouth/Throat: Oropharynx is clear and moist. No oropharyngeal exudate.  Eyes: Conjunctivae and EOM are normal. Pupils are equal, round, and reactive to light.  Neck: Normal range of motion. Neck supple.  No meningismus.  Cardiovascular: Normal rate, regular rhythm, normal heart sounds and intact distal pulses.    No murmur heard. Pulmonary/Chest: Effort normal and breath sounds normal. No respiratory distress. She exhibits no tenderness.  Abdominal: Soft. There is no tenderness. There is no rebound and no guarding.  Musculoskeletal: Normal range of motion. She exhibits no edema or tenderness.  Neurological: She is alert and oriented to person, place, and time. No cranial nerve deficit. She exhibits normal muscle tone. Coordination normal.  No ataxia on finger to nose bilaterally. No pronator drift. 5/5 strength throughout. CN 2-12 intact.Equal grip strength. Sensation intact.  Skin: Skin is warm.  Psychiatric: She has a normal mood and affect. Her behavior is normal.  Nursing note and vitals reviewed.   ED Course  Procedures  DIAGNOSTIC STUDIES: Oxygen Saturation is 100% on RA, normal by my interpretation.  COORDINATION OF CARE:  7:28 PM Discussed treatment plan which includes x-ray of work and lab work with pt at bedside and pt agreed to plan.  Labs Review Labs Reviewed  CBC WITH DIFFERENTIAL/PLATELET - Abnormal; Notable for the following:    Lymphs Abs 4.3 (*)    All other components within normal limits  COMPREHENSIVE METABOLIC PANEL - Abnormal; Notable for the following:    Glucose, Bld 107 (*)    All other components within normal limits  URINALYSIS, ROUTINE W REFLEX MICROSCOPIC (NOT AT [facility]) - Abnormal; Notable for the following:    APPearance CLOUDY (*)    Leukocytes, UA SMALL (*)    All other components within normal limits  URINE MICROSCOPIC-ADD ON - Abnormal; Notable for the following:    Squamous Epithelial / LPF 6-30 (*)    Bacteria, UA RARE (*)    All other components within normal limits  LIPASE, BLOOD  TROPONIN I  D-DIMER, QUANTITATIVE (NOT AT [facility])  PREGNANCY, URINE  TROPONIN I  TROPONIN I  TROPONIN I    Imaging Review Dg Chest 2 View  01/04/2016  CLINICAL DATA:  Mid chest pain starting today, cough, wheezing, shortness of breath, nausea and vomiting. EXAM:  CHEST  2 VIEW COMPARISON:  Chest x-ray dated 02/14/2014. FINDINGS: Cardiomediastinal silhouette is normal in size and configuration. Lungs are clear. Lung volumes are normal. No evidence of pneumonia. No pleural effusion. No pneumothorax. Osseous and soft tissue structures about the chest are unremarkable. IMPRESSION: No active cardiopulmonary disease. Electronically Signed   By: Franki Cabot M.D.   On: 01/04/2016 19:55   I have personally reviewed and evaluated these images and lab results as part of my medical decision-making.   EKG Interpretation   Date/Time:  Friday January 04 2016 19:10:49 EST Ventricular Rate:  72 PR Interval:  144 QRS Duration: 84 QT Interval:  376 QTC Calculation: 411 R Axis:   58 Text Interpretation:  Normal sinus rhythm Normal ECG T wave inversion III  is new but was seen in 2013 Confirmed by Wyvonnia Dusky  MD, Carroll Ranney [phone]) on  01/04/2016 7:33:18 PM      MDM   Final diagnoses:  Chest pain, unspecified chest pain type   2 days of intermittent central chest pain with shortness of breath, diaphoresis, pain radiates to back and neck. Today she had 2 episodes of vomiting with the chest pain. Has had similar chest pain in the past but never had diaphoresis or vomiting. Pain lasts about 5 minutes at a time. No chest pain currently.  EKG is unchanged but showed T-wave inversion lead 3. Chest x-ray negative. Troponin negative. D-dimer negative. Patient's description of chest pain is concerning as it is associated with shortness of breath and diaphoresis and radiation to arm and neck. She also had some vomiting today.  No stress tests in the system. Patient is unsure whether she's had one but she has previous cardiac workup as outlined above.  She feels somewhat better after GI cocktail.  Workup reassuring to this point but her description of chest pain is concerning.  Heart score 3. No previous provocative testing.  Observation admission d/w Dr. Blaine Hamper.   I  personally performed the services described in this documentation, which was scribed in my  presence. The recorded information has been reviewed and is accurate.    Dawn Essex, MD 01/05/16 [phone]

## 2016-01-04 NOTE — ED Notes (Signed)
Pt c/o intermittent central chest pain for the last two days with SOB, diaphoresis, back and neck pain.  Today, when the chest pain occurred, she had 2 episodes of vomiting as well.  Pt has a hx of similar chest pain, but has never had the diaphoreses and n/v before.  She has had holter monitor, stress, echo and cath in the past that were all normal.  Pt denies current chest pain; when it happens, it lasts for about 5-10 minutes at a time.

## 2016-01-04 NOTE — Telephone Encounter (Signed)
Patient called stating that she is having lower back pain, dizziness, sweating, and shortness of breath. Transferred to [facility]. Spoke with Randall Hiss.

## 2016-01-04 NOTE — ED Notes (Signed)
Patient transported to X-ray 

## 2016-01-05 ENCOUNTER — Observation Stay (HOSPITAL_COMMUNITY): Payer: Managed Care, Other (non HMO)

## 2016-01-05 ENCOUNTER — Encounter (HOSPITAL_COMMUNITY): Payer: Self-pay | Admitting: Physician Assistant

## 2016-01-05 DIAGNOSIS — R079 Chest pain, unspecified: Secondary | ICD-10-CM

## 2016-01-05 DIAGNOSIS — I10 Essential (primary) hypertension: Secondary | ICD-10-CM

## 2016-01-05 DIAGNOSIS — I200 Unstable angina: Secondary | ICD-10-CM | POA: Diagnosis not present

## 2016-01-05 DIAGNOSIS — I209 Angina pectoris, unspecified: Secondary | ICD-10-CM | POA: Diagnosis not present

## 2016-01-05 LAB — NM MYOCAR MULTI W/SPECT W/WALL MOTION / EF
CSEPHR: 93 %
CSEPPHR: 164 {beats}/min
Estimated workload: 7.2 METS
Exercise duration (min): 6 min
Exercise duration (sec): 0 s
MPHR: 176 {beats}/min
Rest HR: 72 {beats}/min

## 2016-01-05 LAB — TROPONIN I
Troponin I: <0.03 ng/mL (ref <0.031)
Troponin I: <0.03 ng/mL (ref <0.031)

## 2016-01-05 MED ORDER — TECHNETIUM TC 99M SESTAMIBI GENERIC - CARDIOLITE
30.0000 | Freq: Once | INTRAVENOUS | Status: AC | PRN
  Administered 2016-01-05: 30 via INTRAVENOUS

## 2016-01-05 MED ORDER — TECHNETIUM TC 99M SESTAMIBI GENERIC - CARDIOLITE
10.0000 | Freq: Once | INTRAVENOUS | Status: AC | PRN
  Administered 2016-01-05: 10 via INTRAVENOUS

## 2016-01-05 MED ORDER — VALACYCLOVIR HCL 500 MG PO TABS
500.0000 mg | ORAL_TABLET | Freq: Every day | ORAL | Status: DC
  Administered 2016-01-05: 500 mg via ORAL
  Filled 2016-01-05: qty 1

## 2016-01-05 MED ORDER — ENOXAPARIN SODIUM 40 MG/0.4ML ~~LOC~~ SOLN
40.0000 mg | Freq: Every day | SUBCUTANEOUS | Status: DC
Start: 2016-01-05 — End: 2016-01-05
  Administered 2016-01-05: 40 mg via SUBCUTANEOUS
  Filled 2016-01-05: qty 0.4

## 2016-01-05 MED ORDER — ACETAMINOPHEN 325 MG PO TABS: 650.0000 mg | ORAL_TABLET | ORAL | Status: DC | PRN

## 2016-01-05 MED ORDER — ONDANSETRON HCL 4 MG/2ML IJ SOLN: 4.0000 mg | Freq: Four times a day (QID) | INTRAMUSCULAR | Status: DC | PRN

## 2016-01-05 NOTE — Discharge Summary (Signed)
PATIENT DETAILS Name: [name] Age: 45 y.o. Sex: female Date of Birth: January 27, 1971 MRN: [id number]. Admitting Physician: Ivor Costa, MD [id number] Etter Sjogren, DO  Admit Date: 01/04/2016 Discharge date: 01/05/2016  Recommendations for Outpatient Follow-up:  1. Age appropriate general health maintenance  PRIMARY DISCHARGE DIAGNOSIS:  Principal Problem:   Chest pain Active Problems:   Essential hypertension      PAST MEDICAL HISTORY: Past Medical History  Diagnosis Date  . Allergy   . Asthma   . Hypertension     pulmonary  . Herpes genitalia   . H/O: myomectomy   . GERD (gastroesophageal reflux disease)     no meds  . Heart murmur   . TIA (transient ischemic attack)   . Refusal of blood transfusions as patient is Jehovah's Witness   . Hearing impaired     can hear on left side only    DISCHARGE MEDICATIONS: Current Discharge Medication List    CONTINUE these medications which have NOT CHANGED   Details  albuterol (PROVENTIL HFA;VENTOLIN HFA) 108 (90 BASE) MCG/ACT inhaler Inhale 2 puffs into the lungs every 6 (six) hours as needed for wheezing. Qty: 1 Inhaler, Refills: 2   Associated Diagnoses: Unspecified asthma(493.90)    Ascorbic Acid (VITAMIN C) 1000 MG tablet Take 1,000 mg by mouth daily.    aspirin 325 MG tablet Take 1 tablet (325 mg total) by mouth daily.    cetirizine (ZYRTEC) 10 MG tablet Take 10 mg by mouth daily.    cholecalciferol (VITAMIN D) 1000 UNITS tablet Take 1,000 Units by mouth daily.    cyclobenzaprine (FLEXERIL) 10 MG tablet Take 1 tablet (10 mg total) by mouth 3 (three) times daily as needed for muscle spasms. Qty: 30 tablet, Refills: 0   Associated Diagnoses: Midline low back pain with sciatica, sciatica laterality unspecified    ibuprofen (ADVIL,MOTRIN) 600 MG tablet Take 600 mg by mouth every 6 (six) hours as needed.    lisinopril (PRINIVIL,ZESTRIL) 20 MG tablet Take 1 tablet (20 mg total) by mouth daily. Qty: 90 tablet, Refills: 3    Associated Diagnoses: Essential hypertension    meloxicam (MOBIC) 15 MG tablet Take 1 tablet (15 mg total) by mouth daily. Qty: 30 tablet, Refills: 0   Associated Diagnoses: Pain in joint, shoulder region, right    metoprolol (LOPRESSOR) 100 MG tablet Take 1 tablet (100 mg total) by mouth 2 (two) times daily. Qty: 180 tablet, Refills: 3   Associated Diagnoses: Essential hypertension    Multiple Vitamins-Calcium (ONE-A-DAY WOMENS PO) Take 1 tablet by mouth daily.    nitroGLYCERIN (MINITRAN) 0.2 mg/hr patch Apply 1/4th patch to affected shoulder, change daily Qty: 30 patch, Refills: 1    spironolactone (ALDACTONE) 25 MG tablet Take 1 tablet (25 mg total) by mouth daily. Qty: 90 tablet, Refills: 3   Associated Diagnoses: Essential hypertension; Edema    valACYclovir (VALTREX) 500 MG tablet Take 500 mg by mouth daily.         ALLERGIES:  No Known Allergies  BRIEF HPI:  See H&P, Labs, Consult and Test reports for all details in brief, patient was admitted for valuation of chest pain.  CONSULTATIONS:   cardiology  PERTINENT RADIOLOGIC STUDIES: Dg Chest 2 View  01/04/2016  CLINICAL DATA:  Mid chest pain starting today, cough, wheezing, shortness of breath, nausea and vomiting. EXAM: CHEST  2 VIEW COMPARISON:  Chest x-ray dated 02/14/2014. FINDINGS: Cardiomediastinal silhouette is normal in size and configuration. Lungs are clear. Lung volumes are normal. No evidence  of pneumonia. No pleural effusion. No pneumothorax. Osseous and soft tissue structures about the chest are unremarkable. IMPRESSION: No active cardiopulmonary disease. Electronically Signed   By: Franki Cabot M.D.   On: 01/04/2016 19:55     PERTINENT LAB RESULTS: CBC:  Recent Labs  01/04/16 1923  WBC 9.3  HGB 12.4  HCT 38.2  PLT 323   CMET CMP     Component Value Date/Time   NA 139 01/04/2016 1923   K 4.0 01/04/2016 1923   CL 102 01/04/2016 1923   CO2 28 01/04/2016 1923   GLUCOSE 107* 01/04/2016 1923    BUN 12 01/04/2016 1923   CREATININE 0.90 01/04/2016 1923   CREATININE 0.90 02/21/2011 1602   CALCIUM 9.4 01/04/2016 1923   PROT 7.1 01/04/2016 1923   ALBUMIN 3.8 01/04/2016 1923   AST 21 01/04/2016 1923   ALT 21 01/04/2016 1923   ALKPHOS 62 01/04/2016 1923   BILITOT 0.5 01/04/2016 1923   GFRNONAA >60 01/04/2016 1923   GFRAA >60 01/04/2016 1923    GFR Estimated Creatinine Clearance: 85.6 mL/min (by C-G formula based on Cr of 0.9).  Recent Labs  01/04/16 1923  LIPASE 32    Recent Labs  01/05/16 0021 01/05/16 0351 01/05/16 0557  TROPONINI <0.03 <0.03 <0.03   Invalid input(s): POCBNP  Recent Labs  01/04/16 1923  DDIMER 0.44   No results for input(s): HGBA1C in the last 72 hours. No results for input(s): CHOL, HDL, LDLCALC, TRIG, CHOLHDL, LDLDIRECT in the last 72 hours. No results for input(s): TSH, T4TOTAL, T3FREE, THYROIDAB in the last 72 hours.  Invalid input(s): FREET3 No results for input(s): VITAMINB12, FOLATE, FERRITIN, TIBC, IRON, RETICCTPCT in the last 72 hours. Coags: No results for input(s): INR in the last 72 hours.  Invalid input(s): PT Microbiology: No results found for this or any previous visit (from the past 240 hour(s)).   BRIEF HOSPITAL COURSE:   Principal Problem:   Chest pain: Admitted with chest pain-which has both typical and atypical features. Chest pain free this morning. EKG and cardiac enzymes were negative. D-dimer was also negative. Seen by cardiology, underwent nuclear stress test which was negative. Cardiology has no further recommendations, okay to discharge.  Active Problems:   Essential hypertension: Controlled, continue usual antihypertensives.  TODAY-DAY OF DISCHARGE:  Subjective:   [name] today has no headache,no chest abdominal pain,no new weakness tingling or numbness, feels much better wants to go home today.   Objective:   Blood pressure 201/61, pulse 62, temperature 98.2 F (36.8 C), temperature source  Oral, resp. rate 18, height 5\' 3"  (1.6 m), weight 91.491 kg (201 lb 11.2 oz), last menstrual period 04/18/2012, SpO2 98 %.  Intake/Output Summary (Last 24 hours) at 01/05/16 1352 Last data filed at 01/05/16 0945  Gross per 24 hour  Intake     30 ml  Output    400 ml  Net   -370 ml   Filed Weights   01/04/16 1906 01/04/16 2258 01/05/16 0500  Weight: 90.719 kg (200 lb) 91.7 kg (202 lb 2.6 oz) 91.491 kg (201 lb 11.2 oz)    Exam Awake Alert, Oriented *3, No new F.N deficits, Normal affect [place].AT,PERRAL Supple Neck,No JVD, No cervical lymphadenopathy appriciated.  Symmetrical Chest wall movement, Good air movement bilaterally, CTAB RRR,No Gallops,Rubs or new Murmurs, No Parasternal Heave +ve B.Sounds, Abd Soft, Non tender, No organomegaly appriciated, No rebound -guarding or rigidity. No Cyanosis, Clubbing or edema, No new Rash or bruise  DISCHARGE CONDITION: Stable  DISPOSITION: Home  DISCHARGE INSTRUCTIONS:    Activity:  As tolerated   Get Medicines reviewed and adjusted: Please take all your medications with you for your next visit with your Primary MD  Please request your Primary MD to go over all hospital tests and procedure/radiological results at the follow up, please ask your Primary MD to get all Hospital records sent to his/her office.  If you experience worsening of your admission symptoms, develop shortness of breath, life threatening emergency, suicidal or homicidal thoughts you must seek medical attention immediately by calling 911 or calling your MD immediately  if symptoms less severe.  You must read complete instructions/literature along with all the possible adverse reactions/side effects for all the Medicines you take and that have been prescribed to you. Take any new Medicines after you have completely understood and accpet all the possible adverse reactions/side effects.   Do not drive when taking Pain medications.   Do not take more than prescribed Pain,  Sleep and Anxiety Medications  Special Instructions: If you have smoked or chewed Tobacco  in the last 2 yrs please stop smoking, stop any regular Alcohol  and or any Recreational drug use.  Wear Seat belts while driving.  Please note  You were cared for by a hospitalist during your hospital stay. Once you are discharged, your primary care physician will handle any further medical issues. Please note that NO REFILLS for any discharge medications will be authorized once you are discharged, as it is imperative that you return to your primary care physician (or establish a relationship with a primary care physician if you do not have one) for your aftercare needs so that they can reassess your need for medications and monitor your lab values.   Diet recommendation: Heart Healthy diet  Discharge Instructions    Call MD for:  persistant nausea and vomiting    Complete by:  As directed      Call MD for:  severe uncontrolled pain    Complete by:  As directed      Diet - low sodium heart healthy    Complete by:  As directed      Increase activity slowly    Complete by:  As directed            Follow-up Information    Schedule an appointment as soon as possible for a visit with Garnet Koyanagi, DO.   Specialty:  Family Medicine   Why:  Hospital follow up   Contact information:   [address] [place] [address] [phone]       Follow up with Loralie Champagne, MD. Schedule an appointment as soon as possible for a visit in 2 weeks.   Specialty:  Cardiology   Why:  Hospital follow up   Contact information:   [id number] [address] [phone]      Total Time spent on discharge equals 25  minutes.  SignedOren Binet 01/05/2016 1:52 PM

## 2016-01-05 NOTE — Progress Notes (Signed)
1 day treadmill nuclear stress test completed without significant complication.   Hilbert Corrigan PA Pager: [phone]

## 2016-01-05 NOTE — H&P (Signed)
[facility] History and Physical  Tianni Luckenbaugh Riccardi [id number] DOB: October 20, 1971 DOA: 01/04/2016  Referring physician: EDP PCP: Garnet Koyanagi, DO   Chief Complaint: Chest pain   HPI: [name] is a 45 y.o. female with h/o HTN, patient presents to the ED at [facility] with c/o intermittent, gradual worsening central chest pain that radiates to neck, left arm, and back onset this AM.  Not feeling well over past 2 days with SOB, lightheadedness, vomiting.  2x episodes of vomiting today.  CP comes on at rest and on exertion.  Has had this CP in past and only new thing is vomiting.  Has h/o syncope in 2011, although initial echo was suggestive of pulmonary HTN, a confirmatory RHC did not demonstrate RHC and the echo was felt to be a false positive test.  No h/o stress test or LHC  Did have grandfather die in his 46s of MI.  Review of Systems: Systems reviewed.  As above, otherwise negative  Past Medical History  Diagnosis Date  . Allergy   . Asthma   . Hypertension     pulmonary  . Herpes genitalia   . H/O: myomectomy   . GERD (gastroesophageal reflux disease)     no meds  . Heart murmur   . TIA (transient ischemic attack)   . Refusal of blood transfusions as patient is Jehovah's Witness   . Hearing impaired     can hear on left side only   Past Surgical History  Procedure Laterality Date  . Myomectomy  2004      2010 robotic  . Ovarian cyst removal  2010    x2  . Breast lumpectomy  01/06/11  . Robotic assisted lap vaginal hysterectomy    . Cystoscopy  05/19/2012    Procedure: CYSTOSCOPY;  Surgeon: Alwyn Pea, MD;  Location: [facility];  Service: Gynecology;  Laterality: N/A;  . Robotic assisted laparoscopic ovarian cystectomy Right 03/20/2015    Procedure: ROBOTIC ASSISTED LAPAROSCOPIC Salpingectomy OVARIAN CYSTECTOMY, pelvic washings, excision of peritoneal pseudocyst.;  Surgeon: Delsa Bern, MD;  Location: [facility];  Service: Gynecology;  Laterality: Right;   Social  History:  reports that she has never smoked. She has never used smokeless tobacco. She reports that she does not drink alcohol or use illicit drugs.  No Known Allergies  Family History  Problem Relation Age of Onset  . Asthma Mother   . Arthritis Mother   . Hypertension Father   . Heart disease Maternal Grandmother     PCI  . Cancer Maternal Grandmother 22    breast  . Cancer Other 60    breast and possible colon  . Diabetes Brother   . Migraines Sister      Prior to Admission medications   Medication Sig Start Date End Date Taking? Authorizing Provider  albuterol (PROVENTIL HFA;VENTOLIN HFA) 108 (90 BASE) MCG/ACT inhaler Inhale 2 puffs into the lungs every 6 (six) hours as needed for wheezing. 06/09/13   Rosalita Chessman, DO  Ascorbic Acid (VITAMIN C) 1000 MG tablet Take 1,000 mg by mouth daily.    Historical Provider, MD  aspirin 325 MG tablet Take 1 tablet (325 mg total) by mouth daily. 11/10/12   Erline Hau, MD  cetirizine (ZYRTEC) 10 MG tablet Take 10 mg by mouth daily.    Historical Provider, MD  cholecalciferol (VITAMIN D) 1000 UNITS tablet Take 1,000 Units by mouth daily.    Historical Provider, MD  cyclobenzaprine (FLEXERIL) 10 MG tablet  Take 1 tablet (10 mg total) by mouth 3 (three) times daily as needed for muscle spasms. 07/03/15   Rosalita Chessman, DO  ibuprofen (ADVIL,MOTRIN) 600 MG tablet Take 600 mg by mouth every 6 (six) hours as needed.    Historical Provider, MD  lisinopril (PRINIVIL,ZESTRIL) 20 MG tablet Take 1 tablet (20 mg total) by mouth daily. 03/06/15   Rosalita Chessman, DO  meloxicam (MOBIC) 15 MG tablet Take 1 tablet (15 mg total) by mouth daily. 07/03/15   Rosalita Chessman, DO  metoprolol (LOPRESSOR) 100 MG tablet Take 1 tablet (100 mg total) by mouth 2 (two) times daily. 03/06/15   Rosalita Chessman, DO  Multiple Vitamins-Calcium (ONE-A-DAY WOMENS PO) Take 1 tablet by mouth daily.    Historical Provider, MD  nitroGLYCERIN [facility]) 0.2 mg/hr patch Apply  1/4th patch to affected shoulder, change daily 08/21/15   Dene Gentry, MD  spironolactone (ALDACTONE) 25 MG tablet Take 1 tablet (25 mg total) by mouth daily. 03/06/15   Rosalita Chessman, DO  valACYclovir (VALTREX) 500 MG tablet Take 500 mg by mouth daily.  06/02/13   Historical Provider, MD   Physical Exam: Filed Vitals:   01/04/16 2215 01/04/16 2258  BP:  131/73  Pulse:  55  Temp: 97.5 F (36.4 C) 97.6 F (36.4 C)  Resp:  17    BP 131/73 mmHg  Pulse 55  Temp(Src) 97.6 F (36.4 C) (Oral)  Resp 17  Ht 5\' 3"  (1.6 m)  Wt 91.7 kg (202 lb 2.6 oz)  BMI 35.82 kg/m2  SpO2 100%  LMP 04/18/2012  General Appearance:    Alert, oriented, no distress, appears stated age  Head:    Normocephalic, atraumatic  Eyes:    PERRL, EOMI, sclera non-icteric        Nose:   Nares without drainage or epistaxis. Mucosa, turbinates normal  Throat:   Moist mucous membranes. Oropharynx without erythema or exudate.  Neck:   Supple. No carotid bruits.  No thyromegaly.  No lymphadenopathy.   Back:     No CVA tenderness, no spinal tenderness  Lungs:     Clear to auscultation bilaterally, without wheezes, rhonchi or rales  Chest wall:    No tenderness to palpitation  Heart:    Regular rate and rhythm without murmurs, gallops, rubs  Abdomen:     Soft, non-tender, nondistended, normal bowel sounds, no organomegaly  Genitalia:    deferred  Rectal:    deferred  Extremities:   No clubbing, cyanosis or edema.  Pulses:   2+ and symmetric all extremities  Skin:   Skin color, texture, turgor normal, no rashes or lesions  Lymph nodes:   Cervical, supraclavicular, and axillary nodes normal  Neurologic:   CNII-XII intact. Normal strength, sensation and reflexes      throughout    Labs on Admission:  Basic Metabolic Panel:  Recent Labs Lab 01/04/16 1923  NA 139  K 4.0  CL 102  CO2 28  GLUCOSE 107*  BUN 12  CREATININE 0.90  CALCIUM 9.4   Liver Function Tests:  Recent Labs Lab 01/04/16 1923  AST 21   ALT 21  ALKPHOS 62  BILITOT 0.5  PROT 7.1  ALBUMIN 3.8    Recent Labs Lab 01/04/16 1923  LIPASE 32   No results for input(s): AMMONIA in the last 168 hours. CBC:  Recent Labs Lab 01/04/16 1923  WBC 9.3  NEUTROABS 3.9  HGB 12.4  HCT 38.2  MCV 91.4  PLT  323   Cardiac Enzymes:  Recent Labs Lab 01/04/16 1923  TROPONINI <0.03    BNP (last 3 results) No results for input(s): PROBNP in the last 8760 hours. CBG: No results for input(s): GLUCAP in the last 168 hours.  Radiological Exams on Admission: Dg Chest 2 View  01/04/2016  CLINICAL DATA:  Mid chest pain starting today, cough, wheezing, shortness of breath, nausea and vomiting. EXAM: CHEST  2 VIEW COMPARISON:  Chest x-ray dated 02/14/2014. FINDINGS: Cardiomediastinal silhouette is normal in size and configuration. Lungs are clear. Lung volumes are normal. No evidence of pneumonia. No pleural effusion. No pneumothorax. Osseous and soft tissue structures about the chest are unremarkable. IMPRESSION: No active cardiopulmonary disease. Electronically Signed   By: Franki Cabot M.D.   On: 01/04/2016 19:55    EKG: Independently reviewed.  Assessment/Plan Principal Problem:   Chest pain Active Problems:   Essential hypertension   1. CP - 1. HEART score of 3 2. CP obs pathway 3. Tele monitor 4. Serial troponins 5. NPO after midnight 6. Call cards in AM regarding stress test recs. 2. HTN - continue home meds    Code Status: Full  Family Communication: No family in room Disposition Plan: Admit to obs   Time spent: 50 min  GARDNER, JARED M. [facility] Pager [phone]  If 7AM-7PM, please contact the day team taking care of the patient [email or web address] Password [email or web address] 01/05/2016, 12:01 AM

## 2016-01-05 NOTE — Consult Note (Signed)
CARDIOLOGY CONSULT NOTE   Patient ID: [name] MRN: [id number], DOB/AGE: [birth year]   Admit date: 01/04/2016 Date of Consult: 01/05/2016   Primary Physician: Garnet Koyanagi, DO Primary Cardiologist: Dr. Aundra Dubin  Pt. Profile  [name] with past medical history of syncope likely vasovagal, hypertension, and a history of TIA presented with worsening chest pain since yesterday  Problem List  Past Medical History  Diagnosis Date  . Allergy   . Asthma   . Hypertension     pulmonary  . Herpes genitalia   . H/O: myomectomy   . GERD (gastroesophageal reflux disease)     no meds  . Heart murmur   . TIA (transient ischemic attack)   . Refusal of blood transfusions as patient is Jehovah's Witness   . Hearing impaired     can hear on left side only    Past Surgical History  Procedure Laterality Date  . Myomectomy  2004      2010 robotic  . Ovarian cyst removal  2010    x2  . Breast lumpectomy  01/06/11  . Robotic assisted lap vaginal hysterectomy    . Cystoscopy  05/19/2012    Procedure: CYSTOSCOPY;  Surgeon: Alwyn Pea, MD;  Location: [facility];  Service: Gynecology;  Laterality: N/A;  . Robotic assisted laparoscopic ovarian cystectomy Right 03/20/2015    Procedure: ROBOTIC ASSISTED LAPAROSCOPIC Salpingectomy OVARIAN CYSTECTOMY, pelvic washings, excision of peritoneal pseudocyst.;  Surgeon: Delsa Bern, MD;  Location: [facility];  Service: Gynecology;  Laterality: Right;     Allergies  No Known Allergies  HPI   The patient is a Dawn [name] with past medical history of syncope likely vasovagal, hypertension, and a history of TIA. She was seen by Dr. Aundra Dubin in 2011 for syncope. Holter monitor obtained in December 2011 has several auto triggered and manual triggered event, all consistent with normal sinus rhythm without significant ectopy to explain her symptom. She had echocardiogram in December 2011 showing  EF 55-60%, PA peak pressure 42 mmHg. To further assess and explained pulmonary hypertension, she underwent a right heart cath in December 2011 which was normal. It was felt that elevated pulmonary arterial pressure that was seen on the echo is likely false positive. Last echocardiogram obtained on 11/10/2012 showed EF 60%, peak PA pressure Dawn mmHg.  According to the patient, she has been having intermittent chest discomfort at rest for the past several years. The symptom seems to be increasing over the last week starting on Thursday. Yesterday the symptoms progressed to the point where she was having nausea, vomiting, chest pain, shortness of breath and diaphoresis. It usually last about 5 minutes before resolving, however has been occurring throughout the day up to 10 episodes. She described as a pressure-like sensation in the substernal space radiating to the neck and back. She denies any exacerbating symptoms such as deep inspiration, body rotation, or palpation. The symptom was concerning enough that he eventually decided to seek medical attention at [facility]. Initial EKG was normal except for T wave inversion in lead 3 which is a nonspecific finding. Serial troponin was negative 4. Pregnancy test negative. D-dimer negative. Urinalysis was negative for significant UTI. Chest x-ray was negative. Cardiology has been consulted for intermittent chest pain at rest.  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   Inpatient Medications  . aspirin  324 mg Oral Once  . aspirin  325 mg Oral Daily  . enoxaparin (LOVENOX) injection  40 mg Subcutaneous QHS  . lisinopril  20 mg Oral Daily  . metoprolol  100 mg Oral BID  . spironolactone  25 mg Oral Daily  . valACYclovir  500 mg Oral Daily    Family History Family History  Problem Relation Age of Onset  . Asthma Mother   . Arthritis  Mother   . Hypertension Father   . Heart disease Maternal Grandmother     PCI  . Cancer Maternal Grandmother 47    breast  . Cancer Other 60    breast and possible colon  . Diabetes Brother   . Migraines Sister   . Heart attack Paternal Grandfather     before age 68     Social History Social History   Social History  . Marital Status: Single    Spouse Name: N/A  . Number of Children: N/A  . Years of Education: N/A   Occupational History  . Not on file.   Social History Main Topics  . Smoking status: Never Smoker   . Smokeless tobacco: Never Used  . Alcohol Use: No  . Drug Use: No  . Sexual Activity:    Partners: Male    Birth Control/ Protection: Surgical     Comment: hyst   Other Topics Concern  . Not on file   Social History Narrative   Exercise---walk, zumba, american ballet video     Review of Systems  General:  No chills, fever, night sweats or weight changes.  Cardiovascular:  No dyspnea on exertion, edema, orthopnea, palpitations, paroxysmal nocturnal dyspnea. +chest pain, diaphoresis, SOB, dizziness Dermatological: No rash, lesions/masses Respiratory: No cough, dyspnea Urologic: No hematuria, dysuria Abdominal:   No diarrhea, bright red blood per rectum, melena, or hematemesis +nausea, vomiting Neurologic:  No visual changes, wkns, changes in mental status. All other systems reviewed and are otherwise negative except as noted above.  Physical Exam  Blood pressure 118/84, pulse 62, temperature 98.2 F (36.8 C), temperature source Oral, resp. rate 18, height 5\' 3"  (1.6 m), weight 201 lb 11.2 oz (91.491 kg), last menstrual period 04/18/2012, SpO2 98 %.  General: Dawn, NAD Psych: Normal affect. Neuro: Alert and oriented X 3. Moves all extremities spontaneously. HEENT: Normal  Neck: Supple without bruits or JVD. Lungs:  Resp regular and unlabored, CTA. Heart: RRR no s3, s4, or murmurs. Abdomen: Soft, non-tender, non-distended, BS + x 4.    Extremities: No clubbing, cyanosis or edema. DP/PT/Radials 2+ and equal bilaterally.  Labs   Recent Labs  01/04/16 1923 01/05/16 0021 01/05/16 0351 01/05/16 0557  TROPONINI <0.03 <0.03 <0.03 <0.03   Lab Results  Component Value Date   WBC 9.3 01/04/2016   HGB 12.4 01/04/2016   HCT 38.2 01/04/2016   MCV 91.4 01/04/2016   PLT 323 01/04/2016     Recent Labs Lab 01/04/16 1923  NA 139  K 4.0  CL 102  CO2 28  BUN 12  CREATININE 0.90  CALCIUM 9.4  PROT 7.1  BILITOT 0.5  ALKPHOS 62  ALT 21  AST 21  GLUCOSE 107*   Lab Results  Component Value Date   CHOL 168 03/06/2015   HDL 60.10 03/06/2015   LDLCALC 92 03/06/2015   TRIG 79.0 03/06/2015   Lab Results  Component Value Date   DDIMER 0.44 01/04/2016    Radiology/Studies  Dg Chest 2 View  01/04/2016  CLINICAL DATA:  Mid chest pain starting today, cough, wheezing, shortness of breath, nausea and vomiting. EXAM: CHEST  2 VIEW COMPARISON:  Chest x-ray dated 02/14/2014. FINDINGS: Cardiomediastinal silhouette is normal in size and configuration. Lungs are clear. Lung volumes are normal. No evidence of pneumonia. No pleural effusion. No pneumothorax. Osseous and soft tissue structures about the chest are unremarkable. IMPRESSION: No active cardiopulmonary disease. Electronically Signed   By: Franki Cabot M.D.   On: 01/04/2016 19:55    ECG  Normal sinus rhythm with T-wave inversion in lead 3  ASSESSMENT AND PLAN  1. Episodic chest pain at rest, significant cardiac risk factors include hypertension and a family history of early CAD  - Will obtain treadmill nuclear stress test. EKG showed T-wave inversion in V3 which is a nonspecific finding given lack of adjacent T-wave changes.  2. H/o syncope, per Dr. Claris Gladden note in 2011, likely vasovagal. Negative Holter monitor findings to correlate with arrhythmia back in 2011.  3. Hypertension: Well-controlled on home medication. 20 mg lisinopril daily, metoprolol titrate  100 mg twice a day.   Hilbert Corrigan, PA-C 01/05/2016, 10:09 AM  Patient seen with PA, agree with the above note.  She had prolonged chest pain with negative cardiac enzymes and unremarkable ECG.  Probably non-cardiac chest pain.  However, given risk factors, will arrange for ETT-Cardiolite today.  If this is negative, no further cardiac workup will be necessary.   Loralie Champagne 01/05/2016 1:18 PM

## 2016-01-07 ENCOUNTER — Telehealth: Payer: Self-pay | Admitting: Behavioral Health

## 2016-01-07 NOTE — Telephone Encounter (Signed)
Unable to reach the patient for TCM/Hospital Follow-up at this time. Left message for patient to return call when available.

## 2016-01-08 ENCOUNTER — Encounter: Payer: Self-pay | Admitting: Family Medicine

## 2016-01-08 ENCOUNTER — Ambulatory Visit (INDEPENDENT_AMBULATORY_CARE_PROVIDER_SITE_OTHER): Payer: Managed Care, Other (non HMO) | Admitting: Family Medicine

## 2016-01-08 VITALS — BP 132/60 | HR 77 | Temp 98.0°F | Wt 202.4 lb

## 2016-01-08 DIAGNOSIS — F411 Generalized anxiety disorder: Secondary | ICD-10-CM | POA: Diagnosis not present

## 2016-01-08 DIAGNOSIS — R079 Chest pain, unspecified: Secondary | ICD-10-CM

## 2016-01-08 MED ORDER — ALPRAZOLAM 0.25 MG PO TABS: 0.2500 mg | ORAL_TABLET | Freq: Three times a day (TID) | ORAL | Status: DC | PRN

## 2016-01-08 MED ORDER — OMEPRAZOLE 40 MG PO CPDR: 40.0000 mg | DELAYED_RELEASE_CAPSULE | Freq: Every day | ORAL | Status: DC

## 2016-01-08 MED FILL — OMEPRAZOLE DR 40 MG CAPSULE: 40 | 30 days supply | Qty: 30 | Fill #0

## 2016-01-08 NOTE — Progress Notes (Signed)
Pre visit review using our clinic review tool, if applicable. No additional management support is needed unless otherwise documented below in the visit note. 

## 2016-01-08 NOTE — Patient Instructions (Addendum)

## 2016-01-08 NOTE — Telephone Encounter (Signed)
Transition Care Management Follow-up Telephone Call  Admitting Physician: Ivor Costa, MD [id number] Etter Sjogren, DO  Admit Date: 01/04/2016 Discharge date: 01/05/2016  Recommendations for Outpatient Follow-up:  1. Age appropriate general health maintenance  PRIMARY DISCHARGE DIAGNOSIS: Principal Problem:  Chest pain Active Problems:  Essential hypertension   How have you been since you were released from the hospital? Patient stated, "tired".   Do you understand why you were in the hospital? yes, patient voiced that she did not receive a definitive diagnosis, but was seen for chest pain & difficult breathing.   Do you understand the discharge instructions? yes   Where were you discharged to? Home   Items Reviewed:  Medications reviewed: yes  Allergies reviewed: yes  Dietary changes reviewed: yes, no changes in diet.  Referrals reviewed: yes, follow-up with Dr. Loralie Champagne, Cardiologist in 2 weeks.   Functional Questionnaire:   Activities of Daily Living (ADLs):   She states they are independent in the following: ambulation, bathing and hygiene, feeding, continence, grooming, toileting and dressing States they require assistance with the following: None   Any transportation issues/concerns?: no   Any patient concerns? yes, patient verbalized that she's concerned about the possibility of having these same symptoms again.   Confirmed importance and date/time of follow-up visits scheduled yes, 01/08/16 at 4:15 PM.  Provider Appointment booked with Dr. Etter Sjogren.  Confirmed with patient if condition begins to worsen call PCP or go to the ER.  Patient was given the office number and encouraged to call back with question or concerns.  : yes

## 2016-01-08 NOTE — Addendum Note (Signed)
Addended by: Eduard Roux E on: 01/08/2016 01:53 PM   Modules accepted: Medications

## 2016-01-08 NOTE — Progress Notes (Signed)
Patient ID: [name], female    DOB: [birth year]  Age: 45 y.o. MRN: [id number]    Subjective:  Subjective HPI Jerry Akram Intrieri presents for f/u chest pain and hosp admit.  Ddimer, troponin, stress test all neg.  Pt saw cardiology and instructed to f/u with them as out patient.  Pt admits to a lot of stress with home/ work life.     Review of Systems  Constitutional: Negative for diaphoresis, appetite change, fatigue and unexpected weight change.  Eyes: Negative for pain, redness and visual disturbance.  Respiratory: Negative for cough, chest tightness, shortness of breath and wheezing.   Cardiovascular: Negative for chest pain, palpitations and leg swelling.  Endocrine: Negative for cold intolerance, heat intolerance, polydipsia, polyphagia and polyuria.  Genitourinary: Negative for dysuria, frequency and difficulty urinating.  Neurological: Negative for dizziness, light-headedness, numbness and headaches.    History Past Medical History  Diagnosis Date  . Allergy   . Asthma   . Hypertension     pulmonary  . Herpes genitalia   . H/O: myomectomy   . GERD (gastroesophageal reflux disease)     no meds  . Heart murmur   . TIA (transient ischemic attack)   . Refusal of blood transfusions as patient is Jehovah's Witness   . Hearing impaired     can hear on left side only    She has past surgical history that includes Myomectomy (2004  ); Ovarian cyst removal (2010); Breast lumpectomy (01/06/11); Robotic assisted lap vaginal hysterectomy; Cystoscopy (05/19/2012); and Robotic assisted laparoscopic ovarian cystectomy (Right, 03/20/2015).   Her family history includes Arthritis in her mother; Asthma in her mother; Cancer (age of onset: 7) in her other; Cancer (age of onset: 7) in her maternal grandmother; Diabetes in her brother; Heart attack in her paternal grandfather; Heart disease in her maternal grandmother; Hypertension in her father; Migraines in her sister.She reports that she  has never smoked. She has never used smokeless tobacco. She reports that she does not drink alcohol or use illicit drugs.  Current Outpatient Prescriptions on File Prior to Visit  Medication Sig Dispense Refill  . albuterol (PROVENTIL HFA;VENTOLIN HFA) 108 (90 BASE) MCG/ACT inhaler Inhale 2 puffs into the lungs every 6 (six) hours as needed for wheezing. 1 Inhaler 2  . Ascorbic Acid (VITAMIN C) 1000 MG tablet Take 1,000 mg by mouth daily.    Marland Kitchen aspirin 325 MG tablet Take 1 tablet (325 mg total) by mouth daily.    . cetirizine (ZYRTEC) 10 MG tablet Take 10 mg by mouth daily.    . cholecalciferol (VITAMIN D) 1000 UNITS tablet Take 1,000 Units by mouth daily.    Marland Kitchen ibuprofen (ADVIL,MOTRIN) 600 MG tablet Take 600 mg by mouth every 6 (six) hours as needed.    Marland Kitchen lisinopril (PRINIVIL,ZESTRIL) 20 MG tablet Take 1 tablet (20 mg total) by mouth daily. 90 tablet 3  . metoprolol (LOPRESSOR) 100 MG tablet Take 1 tablet (100 mg total) by mouth 2 (two) times daily. 180 tablet 3  . Multiple Vitamins-Calcium (ONE-A-DAY WOMENS PO) Take 1 tablet by mouth daily.    . nitroGLYCERIN (MINITRAN) 0.2 mg/hr patch Apply 1/4th patch to affected shoulder, change daily 30 patch 1  . spironolactone (ALDACTONE) 25 MG tablet Take 1 tablet (25 mg total) by mouth daily. 90 tablet 3  . valACYclovir (VALTREX) 500 MG tablet Take 500 mg by mouth daily.      No current facility-administered medications on file prior to visit.  Objective:  Objective Physical Exam  Constitutional: She is oriented to person, place, and time. She appears well-developed and well-nourished.  HENT:  Head: Normocephalic and atraumatic.  Eyes: Conjunctivae and EOM are normal.  Neck: Normal range of motion. Neck supple. No JVD present. Carotid bruit is not present. No thyromegaly present.  Cardiovascular: Normal rate, regular rhythm and normal heart sounds.   No murmur heard. Pulmonary/Chest: Effort normal and breath sounds normal. No respiratory  distress. She has no wheezes. She has no rales. She exhibits no tenderness.  Musculoskeletal: She exhibits no edema.  Neurological: She is alert and oriented to person, place, and time.  Psychiatric: She has a normal mood and affect.  Nursing note and vitals reviewed.  BP 132/60 mmHg  Pulse 77  Temp(Src) 98 F (36.7 C) (Oral)  Wt 202 lb 6.4 oz (91.808 kg)  SpO2 98%  LMP 04/18/2012 Wt Readings from Last 3 Encounters:  01/08/16 202 lb 6.4 oz (91.808 kg)  01/05/16 201 lb 11.2 oz (91.491 kg)  11/20/15 201 lb 9.6 oz (91.445 kg)     Lab Results  Component Value Date   WBC 9.3 01/04/2016   HGB 12.4 01/04/2016   HCT 38.2 01/04/2016   PLT 323 01/04/2016   GLUCOSE 107* 01/04/2016   CHOL 168 03/06/2015   TRIG 79.0 03/06/2015   HDL 60.10 03/06/2015   LDLCALC 92 03/06/2015   ALT 21 01/04/2016   AST 21 01/04/2016   NA 139 01/04/2016   K 4.0 01/04/2016   CL 102 01/04/2016   CREATININE 0.90 01/04/2016   BUN 12 01/04/2016   CO2 28 01/04/2016   TSH 0.56 03/06/2015   INR 1.11 11/09/2012   HGBA1C 5.1 11/10/2012    Dg Chest 2 View  01/04/2016  CLINICAL DATA:  Mid chest pain starting today, cough, wheezing, shortness of breath, nausea and vomiting. EXAM: CHEST  2 VIEW COMPARISON:  Chest x-ray dated 02/14/2014. FINDINGS: Cardiomediastinal silhouette is normal in size and configuration. Lungs are clear. Lung volumes are normal. No evidence of pneumonia. No pleural effusion. No pneumothorax. Osseous and soft tissue structures about the chest are unremarkable. IMPRESSION: No active cardiopulmonary disease. Electronically Signed   By: Franki Cabot M.D.   On: 01/04/2016 19:55   Nm Myocar Multi W/spect W/wall Motion / Ef  01/05/2016   Blood pressure demonstrated a hypertensive response to exercise.  No T wave inversion was noted during stress.  Defect 1: There is a small defect of mild severity present in the mid anterior and apical anterior location.  This is a low risk study.  Nuclear  stress EF: 62%.  The left ventricular ejection fraction is hyperdynamic (>65%).      Assessment & Plan:  Plan I have discontinued Ms. Schappell's meloxicam and cyclobenzaprine. I am also having her start on ALPRAZolam and omeprazole. Additionally, I am having her maintain her Multiple Vitamins-Calcium (ONE-A-DAY WOMENS PO), aspirin, valACYclovir, albuterol, vitamin C, lisinopril, metoprolol, spironolactone, cetirizine, cholecalciferol, ibuprofen, and nitroGLYCERIN.  Meds ordered this encounter  Medications  . ALPRAZolam (XANAX) 0.25 MG tablet    Sig: Take 1 tablet (0.25 mg total) by mouth 3 (three) times daily as needed for anxiety or sleep.    Dispense:  30 tablet    Refill:  0  . omeprazole (PRILOSEC) 40 MG capsule    Sig: Take 1 capsule (40 mg total) by mouth daily.    Dispense:  30 capsule    Refill:  3    Problem List Items Addressed This Visit  None    Visit Diagnoses    Generalized anxiety disorder    -  Primary    Relevant Medications    ALPRAZolam (XANAX) 0.25 MG tablet    Chest pain, unspecified chest pain type        Relevant Medications    omeprazole (PRILOSEC) 40 MG capsule    Other Relevant Orders    Ambulatory referral to Cardiology       Follow-up: Return in about 4 weeks (around 02/05/2016).  Garnet Koyanagi, DO

## 2016-01-29 ENCOUNTER — Other Ambulatory Visit: Payer: Self-pay | Admitting: Family Medicine

## 2016-01-29 NOTE — Telephone Encounter (Signed)
Medication filled to pharmacy as requested.   

## 2016-02-01 ENCOUNTER — Encounter: Payer: Self-pay | Admitting: Cardiovascular Disease

## 2016-02-01 ENCOUNTER — Ambulatory Visit (INDEPENDENT_AMBULATORY_CARE_PROVIDER_SITE_OTHER): Payer: Managed Care, Other (non HMO) | Admitting: Cardiovascular Disease

## 2016-02-01 VITALS — BP 90/60 | HR 78 | Ht 63.0 in | Wt 202.0 lb

## 2016-02-01 DIAGNOSIS — I209 Angina pectoris, unspecified: Secondary | ICD-10-CM

## 2016-02-01 NOTE — Patient Instructions (Signed)
Medication Instructions:  Your physician recommends that you continue on your current medications as directed. Please refer to the Current Medication list given to you today.   Labwork: None Ordered   Testing/Procedures: None Ordered   Follow-Up: Your physician recommends that you schedule a follow-up appointment in: as needed with Dr. Aundra Dubin   If you need a refill on your cardiac medications before your next appointment, please call your pharmacy.   Thank you for choosing [facility]! Christen Bame, RN [phone]

## 2016-02-01 NOTE — Progress Notes (Signed)
Cardiology Office Note   Date:  02/01/2016   ID:  [name], DOB 03-29-1971, MRN [id number]  PCP:  Garnet Koyanagi, DO  Cardiologist:   Loralie Champagne, MD  Chief Complaint  Patient presents with  . Chest Pain   Problem List 1. CHest pain - negative myoview in Feb. 2017. 2. Asthma 3. HTN 4. TIa - 2013    History of Present Illness: [name] is a 45 y.o. female who presents for Follow-up for her recent hospitalization. She was admitted to the hospital in February with an episode of chest discomfort. A stress Myoview study was low risk. She has normal left ventricle systolic function. There was a small mid anterior defect that was likely breast artifact.  She's done well and has not had any further episodes of chest pain since that time. Is back to exercising . No problems with exercise  - just the normal DOE and sweating   Has had some right shoulder pain - she was using NTG   Past Medical History  Diagnosis Date  . Allergy   . Asthma   . Hypertension     pulmonary  . Herpes genitalia   . H/O: myomectomy   . GERD (gastroesophageal reflux disease)     no meds  . Heart murmur   . TIA (transient ischemic attack)   . Refusal of blood transfusions as patient is Jehovah's Witness   . Hearing impaired     can hear on left side only    Past Surgical History  Procedure Laterality Date  . Myomectomy  2004      2010 robotic  . Ovarian cyst removal  2010    x2  . Breast lumpectomy  01/06/11  . Robotic assisted lap vaginal hysterectomy    . Cystoscopy  05/19/2012    Procedure: CYSTOSCOPY;  Surgeon: Alwyn Pea, MD;  Location: [facility];  Service: Gynecology;  Laterality: N/A;  . Robotic assisted laparoscopic ovarian cystectomy Right 03/20/2015    Procedure: ROBOTIC ASSISTED LAPAROSCOPIC Salpingectomy OVARIAN CYSTECTOMY, pelvic washings, excision of peritoneal pseudocyst.;  Surgeon: Delsa Bern, MD;  Location: [facility];  Service: Gynecology;  Laterality: Right;      Current Outpatient Prescriptions  Medication Sig Dispense Refill  . albuterol (PROVENTIL HFA;VENTOLIN HFA) 108 (90 BASE) MCG/ACT inhaler Inhale 2 puffs into the lungs every 6 (six) hours as needed for wheezing. 1 Inhaler 2  . ALPRAZolam (XANAX) 0.25 MG tablet Take 1 tablet (0.25 mg total) by mouth 3 (three) times daily as needed for anxiety or sleep. 30 tablet 0  . Ascorbic Acid (VITAMIN C) 1000 MG tablet Take 1,000 mg by mouth daily.    Marland Kitchen aspirin 325 MG tablet Take 1 tablet (325 mg total) by mouth daily.    . cetirizine (ZYRTEC) 10 MG tablet Take 10 mg by mouth daily.    . cholecalciferol (VITAMIN D) 1000 UNITS tablet Take 1,000 Units by mouth daily.    Marland Kitchen ibuprofen (ADVIL,MOTRIN) 600 MG tablet Take 600 mg by mouth every 6 (six) hours as needed for headache or mild pain.     Marland Kitchen lisinopril (PRINIVIL,ZESTRIL) 20 MG tablet Take 1 tablet (20 mg total) by mouth daily. 90 tablet 3  . metoprolol (LOPRESSOR) 100 MG tablet Take 1 tablet (100 mg total) by mouth 2 (two) times daily. 180 tablet 3  . Multiple Vitamins-Calcium (ONE-A-DAY WOMENS PO) Take 1 tablet by mouth daily.    . nitroGLYCERIN (MINITRAN) 0.2 mg/hr patch Apply 1/4th patch to  affected shoulder, change daily 30 patch 1  . omeprazole (PRILOSEC) 40 MG capsule Take 1 capsule (40 mg total) by mouth daily. 30 capsule 3  . spironolactone (ALDACTONE) 25 MG tablet TAKE ONE TABLET BY MOUTH ONCE DAILY 30 tablet 0  . valACYclovir (VALTREX) 500 MG tablet Take 500 mg by mouth daily.      No current facility-administered medications for this visit.    Allergies:   Review of patient's allergies indicates no known allergies.    Social History:  The patient  reports that she has never smoked. She has never used smokeless tobacco. She reports that she does not drink alcohol or use illicit drugs.   Family History:  The patient's family history includes Arthritis in her mother; Asthma in her mother; Cancer (age of onset: 49) in her other; Cancer (age  of onset: 31) in her maternal grandmother; Diabetes in her brother; Heart attack in her paternal grandfather; Heart disease in her maternal grandmother; Hypertension in her father; Migraines in her sister.    ROS:  Please see the history of present illness.    Review of Systems: Constitutional:  denies fever, chills, diaphoresis, appetite change and fatigue.  HEENT: denies photophobia, eye pain, redness, hearing loss, ear pain, congestion, sore throat, rhinorrhea, sneezing, neck pain, neck stiffness and tinnitus.  Respiratory: denies SOB, DOE, cough, chest tightness, and wheezing.  Cardiovascular: denies chest pain, palpitations and leg swelling.  Gastrointestinal: admits to nausea, vomiting, abdominal pain, diarrhea, constipation, blood in stool.  Genitourinary: denies dysuria, urgency, frequency, hematuria, flank pain and difficulty urinating.  Musculoskeletal: denies  myalgias, back pain, joint swelling, arthralgias and gait problem.   Skin: denies pallor, rash and wound.  Neurological: denies dizziness, seizures, syncope, weakness, light-headedness, numbness and headaches.   Hematological: denies adenopathy, easy bruising, personal or family bleeding history.  Psychiatric/ Behavioral: denies suicidal ideation, mood changes, confusion, nervousness, sleep disturbance and agitation.       All other systems are reviewed and negative.    PHYSICAL EXAM: VS:  BP 90/60 mmHg  Pulse 78  Ht 5\' 3"  (1.6 m)  Wt 202 lb (91.627 kg)  BMI 35.79 kg/m2  LMP 04/18/2012 , BMI Body mass index is 35.79 kg/(m^2). GEN: Well nourished, well developed, in no acute distress HEENT: normal Neck: no JVD, carotid bruits, or masses Cardiac: RRR; no murmurs, rubs, or gallops,no edema  Respiratory:  clear to auscultation bilaterally, normal work of breathing GI: soft, nontender, nondistended, + BS MS: no deformity or atrophy Skin: warm and dry, no rash Neuro:  Strength and sensation are intact Psych:  normal   EKG:  EKG is not ordered today.    Recent Labs: 03/06/2015: TSH 0.56 01/04/2016: ALT 21; BUN 12; Creatinine, Ser 0.90; Hemoglobin 12.4; Platelets 323; Potassium 4.0; Sodium 139    Lipid Panel    Component Value Date/Time   CHOL 168 03/06/2015 1022   TRIG 79.0 03/06/2015 1022   HDL 60.10 03/06/2015 1022   CHOLHDL 3 03/06/2015 1022   VLDL 15.8 03/06/2015 1022   LDLCALC 92 03/06/2015 1022      Wt Readings from Last 3 Encounters:  02/01/16 202 lb (91.627 kg)  01/08/16 202 lb 6.4 oz (91.808 kg)  01/05/16 201 lb 11.2 oz (91.491 kg)      Other studies Reviewed: Additional studies/ records that were reviewed today include: . Review of the above records demonstrates:    ASSESSMENT AND PLAN:  1.  Chest pain :   Negative myoview.   Very atypical .  Normal LV function  She had a normal Myoview. She had a very small anterior defect that is probably of breast artifact. At this point I don't think that she needs any further evaluation.  Return to see Dr. Algernon Huxley as needed  2. Obesity :   Encouraged her to lose weight    Current medicines are reviewed at length with the patient today.  The patient does not have concerns regarding medicines.  The following changes have been made:  no change  Labs/ tests ordered today include:  No orders of the defined types were placed in this encounter.     Disposition:   FU with Dr. Aundra Dubin as needed.      Augustus Zurawski, Wonda Cheng, MD  02/01/2016 9:04 AM    [facility] [place], [place], [place]  [address] Phone: [phone]; Fax: [phone]   [facility]  [address] [place], [place]  [address] [phone]   Fax [phone]

## 2016-02-19 ENCOUNTER — Encounter: Payer: Self-pay | Admitting: Family Medicine

## 2016-02-19 ENCOUNTER — Ambulatory Visit (INDEPENDENT_AMBULATORY_CARE_PROVIDER_SITE_OTHER): Payer: Managed Care, Other (non HMO) | Admitting: Family Medicine

## 2016-02-19 VITALS — BP 118/60 | HR 69 | Temp 98.1°F | Wt 204.8 lb

## 2016-02-19 DIAGNOSIS — I10 Essential (primary) hypertension: Secondary | ICD-10-CM

## 2016-02-19 DIAGNOSIS — R079 Chest pain, unspecified: Secondary | ICD-10-CM | POA: Diagnosis not present

## 2016-02-19 DIAGNOSIS — J4520 Mild intermittent asthma, uncomplicated: Secondary | ICD-10-CM | POA: Diagnosis not present

## 2016-02-19 MED ORDER — OMEPRAZOLE 40 MG PO CPDR: 40.0000 mg | DELAYED_RELEASE_CAPSULE | Freq: Every day | ORAL | Status: DC

## 2016-02-19 MED ORDER — SPIRONOLACTONE 25 MG PO TABS: 25.0000 mg | ORAL_TABLET | Freq: Every day | ORAL | Status: DC

## 2016-02-19 MED ORDER — ALBUTEROL SULFATE HFA 108 (90 BASE) MCG/ACT IN AERS: 2.0000 | INHALATION_SPRAY | Freq: Four times a day (QID) | RESPIRATORY_TRACT | Status: DC | PRN

## 2016-02-19 MED ORDER — METOPROLOL TARTRATE 100 MG PO TABS: 100.0000 mg | ORAL_TABLET | Freq: Two times a day (BID) | ORAL | Status: DC

## 2016-02-19 MED ORDER — LISINOPRIL 10 MG PO TABS: 10.0000 mg | ORAL_TABLET | Freq: Every day | ORAL | Status: DC

## 2016-02-19 NOTE — Assessment & Plan Note (Signed)
Low bp Dec lisinopril to 10 mg daily con't lopressor

## 2016-02-19 NOTE — Progress Notes (Signed)
Patient ID: [name], female    DOB: [birth year]  Age: 45 y.o. MRN: [id number]    Subjective:  Subjective HPI [name] presents for f/u bp , anxiety.   No complaints.  Review of Systems  Constitutional: Negative for diaphoresis, appetite change, fatigue and unexpected weight change.  Eyes: Negative for pain, redness and visual disturbance.  Respiratory: Negative for cough, chest tightness, shortness of breath and wheezing.   Cardiovascular: Negative for chest pain, palpitations and leg swelling.  Endocrine: Negative for cold intolerance, heat intolerance, polydipsia, polyphagia and polyuria.  Genitourinary: Negative for dysuria, frequency and difficulty urinating.  Neurological: Negative for dizziness, light-headedness, numbness and headaches.    History Past Medical History  Diagnosis Date  . Allergy   . Asthma   . Hypertension     pulmonary  . Herpes genitalia   . H/O: myomectomy   . GERD (gastroesophageal reflux disease)     no meds  . Heart murmur   . TIA (transient ischemic attack)   . Refusal of blood transfusions as patient is Jehovah's Witness   . Hearing impaired     can hear on left side only    She has past surgical history that includes Myomectomy (2004  ); Ovarian cyst removal (2010); Breast lumpectomy (01/06/11); Robotic assisted lap vaginal hysterectomy; Cystoscopy (05/19/2012); and Robotic assisted laparoscopic ovarian cystectomy (Right, 03/20/2015).   Her family history includes Arthritis in her mother; Asthma in her mother; Cancer (age of onset: 44) in her other; Cancer (age of onset: 60) in her maternal grandmother; Diabetes in her brother; Heart attack in her paternal grandfather; Heart disease in her maternal grandmother; Hypertension in her father; Migraines in her sister.She reports that she has never smoked. She has never used smokeless tobacco. She reports that she does not drink alcohol or use illicit drugs.  Current Outpatient Prescriptions  on File Prior to Visit  Medication Sig Dispense Refill  . ALPRAZolam (XANAX) 0.25 MG tablet Take 1 tablet (0.25 mg total) by mouth 3 (three) times daily as needed for anxiety or sleep. 30 tablet 0  . Ascorbic Acid (VITAMIN C) 1000 MG tablet Take 1,000 mg by mouth daily.    Marland Kitchen aspirin 325 MG tablet Take 1 tablet (325 mg total) by mouth daily.    . cetirizine (ZYRTEC) 10 MG tablet Take 10 mg by mouth daily.    . cholecalciferol (VITAMIN D) 1000 UNITS tablet Take 1,000 Units by mouth daily.    Marland Kitchen ibuprofen (ADVIL,MOTRIN) 600 MG tablet Take 600 mg by mouth every 6 (six) hours as needed for headache or mild pain.     . Multiple Vitamins-Calcium (ONE-A-DAY WOMENS PO) Take 1 tablet by mouth daily.    . nitroGLYCERIN (MINITRAN) 0.2 mg/hr patch Apply 1/4th patch to affected shoulder, change daily 30 patch 1  . valACYclovir (VALTREX) 500 MG tablet Take 500 mg by mouth daily.      No current facility-administered medications on file prior to visit.     Objective:  Objective Physical Exam  Constitutional: She is oriented to person, place, and time. She appears well-developed and well-nourished.  HENT:  Head: Normocephalic and atraumatic.  Eyes: Conjunctivae and EOM are normal.  Neck: Normal range of motion. Neck supple. No JVD present. Carotid bruit is not present. No thyromegaly present.  Cardiovascular: Normal rate, regular rhythm and normal heart sounds.   No murmur heard. Pulmonary/Chest: Effort normal and breath sounds normal. No respiratory distress. She has no wheezes. She has no rales.  She exhibits no tenderness.  Musculoskeletal: She exhibits no edema.  Neurological: She is alert and oriented to person, place, and time.  Psychiatric: She has a normal mood and affect. Her behavior is normal. Judgment and thought content normal.  Nursing note and vitals reviewed.  BP 118/60 mmHg  Pulse 69  Temp(Src) 98.1 F (36.7 C) (Oral)  Wt 204 lb 12.8 oz (92.897 kg)  SpO2 98%  LMP 04/18/2012 Wt  Readings from Last 3 Encounters:  02/19/16 204 lb 12.8 oz (92.897 kg)  02/01/16 202 lb (91.627 kg)  01/08/16 202 lb 6.4 oz (91.808 kg)     Lab Results  Component Value Date   WBC 9.3 01/04/2016   HGB 12.4 01/04/2016   HCT 38.2 01/04/2016   PLT 323 01/04/2016   GLUCOSE 107* 01/04/2016   CHOL 168 03/06/2015   TRIG 79.0 03/06/2015   HDL 60.10 03/06/2015   LDLCALC 92 03/06/2015   ALT 21 01/04/2016   AST 21 01/04/2016   NA 139 01/04/2016   K 4.0 01/04/2016   CL 102 01/04/2016   CREATININE 0.90 01/04/2016   BUN 12 01/04/2016   CO2 28 01/04/2016   TSH 0.56 03/06/2015   INR 1.11 11/09/2012   HGBA1C 5.1 11/10/2012    Dg Chest 2 View  01/04/2016  CLINICAL DATA:  Mid chest pain starting today, cough, wheezing, shortness of breath, nausea and vomiting. EXAM: CHEST  2 VIEW COMPARISON:  Chest x-ray dated 02/14/2014. FINDINGS: Cardiomediastinal silhouette is normal in size and configuration. Lungs are clear. Lung volumes are normal. No evidence of pneumonia. No pleural effusion. No pneumothorax. Osseous and soft tissue structures about the chest are unremarkable. IMPRESSION: No active cardiopulmonary disease. Electronically Signed   By: Franki Cabot M.D.   On: 01/04/2016 19:55   Nm Myocar Multi W/spect W/wall Motion / Ef  01/05/2016   Blood pressure demonstrated a hypertensive response to exercise.  No T wave inversion was noted during stress.  Defect 1: There is a small defect of mild severity present in the mid anterior and apical anterior location.  This is a low risk study.  Nuclear stress EF: 62%.  The left ventricular ejection fraction is hyperdynamic (>65%).      Assessment & Plan:  Plan I have discontinued Ms. Pucillo's lisinopril. I have also changed her spironolactone. Additionally, I am having her start on lisinopril. Lastly, I am having her maintain her Multiple Vitamins-Calcium (ONE-A-DAY WOMENS PO), aspirin, valACYclovir, vitamin C, cetirizine, cholecalciferol,  ibuprofen, nitroGLYCERIN, ALPRAZolam, omeprazole, metoprolol, and albuterol.  Meds ordered this encounter  Medications  . lisinopril (PRINIVIL,ZESTRIL) 10 MG tablet    Sig: Take 1 tablet (10 mg total) by mouth daily.    Dispense:  90 tablet    Refill:  3  . spironolactone (ALDACTONE) 25 MG tablet    Sig: Take 1 tablet (25 mg total) by mouth daily.    Dispense:  30 tablet    Refill:  5  . omeprazole (PRILOSEC) 40 MG capsule    Sig: Take 1 capsule (40 mg total) by mouth daily.    Dispense:  30 capsule    Refill:  3  . metoprolol (LOPRESSOR) 100 MG tablet    Sig: Take 1 tablet (100 mg total) by mouth 2 (two) times daily.    Dispense:  180 tablet    Refill:  3    D/C PREVIOUS SCRIPTS FOR THIS MEDICATION  . albuterol (PROVENTIL HFA;VENTOLIN HFA) 108 (90 Base) MCG/ACT inhaler    Sig: Inhale 2  puffs into the lungs every 6 (six) hours as needed for wheezing.    Dispense:  1 Inhaler    Refill:  2    Problem List Items Addressed This Visit      Unprioritized   Essential hypertension - Primary (Chronic)    Low bp Dec lisinopril to 10 mg daily con't lopressor      Relevant Medications   lisinopril (PRINIVIL,ZESTRIL) 10 MG tablet   spironolactone (ALDACTONE) 25 MG tablet   metoprolol (LOPRESSOR) 100 MG tablet   Chest pain   Relevant Medications   omeprazole (PRILOSEC) 40 MG capsule    Other Visit Diagnoses    Asthma, mild intermittent, uncomplicated        Relevant Medications    albuterol (PROVENTIL HFA;VENTOLIN HFA) 108 (90 Base) MCG/ACT inhaler       Follow-up: Return in about 3 weeks (around 03/11/2016), or if symptoms worsen or fail to improve, for hypertension.  Ann Held, DO

## 2016-02-19 NOTE — Patient Instructions (Signed)
Hypertension Hypertension, commonly called high blood pressure, is when the force of blood pumping through your arteries is too strong. Your arteries are the blood vessels that carry blood from your heart throughout your body. A blood pressure reading consists of a higher number over a lower number, such as 110/72. The higher number (systolic) is the pressure inside your arteries when your heart pumps. The lower number (diastolic) is the pressure inside your arteries when your heart relaxes. Ideally you want your blood pressure below 120/80. Hypertension forces your heart to work harder to pump blood. Your arteries may become narrow or stiff. Having untreated or uncontrolled hypertension can cause heart attack, stroke, kidney disease, and other problems. RISK FACTORS Some risk factors for high blood pressure are controllable. Others are not.  Risk factors you cannot control include:   Race. You may be at higher risk if you are African American.  Age. Risk increases with age.  Gender. Men are at higher risk than women before age 45 years. After age 65, women are at higher risk than men. Risk factors you can control include:  Not getting enough exercise or physical activity.  Being overweight.  Getting too much fat, sugar, calories, or salt in your diet.  Drinking too much alcohol. SIGNS AND SYMPTOMS Hypertension does not usually cause signs or symptoms. Extremely high blood pressure (hypertensive crisis) may cause headache, anxiety, shortness of breath, and nosebleed. DIAGNOSIS To check if you have hypertension, your health care provider will measure your blood pressure while you are seated, with your arm held at the level of your heart. It should be measured at least twice using the same arm. Certain conditions can cause a difference in blood pressure between your right and left arms. A blood pressure reading that is higher than normal on one occasion does not mean that you need treatment. If  it is not clear whether you have high blood pressure, you may be asked to return on a different day to have your blood pressure checked again. Or, you may be asked to monitor your blood pressure at home for 1 or more weeks. TREATMENT Treating high blood pressure includes making lifestyle changes and possibly taking medicine. Living a healthy lifestyle can help lower high blood pressure. You may need to change some of your habits. Lifestyle changes may include:  Following the DASH diet. This diet is high in fruits, vegetables, and whole grains. It is low in salt, red meat, and added sugars.  Keep your sodium intake below 2,300 mg per day.  Getting at least 30-45 minutes of aerobic exercise at least 4 times per week.  Losing weight if necessary.  Not smoking.  Limiting alcoholic beverages.  Learning ways to reduce stress. Your health care provider may prescribe medicine if lifestyle changes are not enough to get your blood pressure under control, and if one of the following is true:  You are 18-59 years of age and your systolic blood pressure is above 140.  You are 60 years of age or older, and your systolic blood pressure is above 150.  Your diastolic blood pressure is above 90.  You have diabetes, and your systolic blood pressure is over 140 or your diastolic blood pressure is over 90.  You have kidney disease and your blood pressure is above 140/90.  You have heart disease and your blood pressure is above 140/90. Your personal target blood pressure may vary depending on your medical conditions, your age, and other factors. HOME CARE INSTRUCTIONS    Have your blood pressure rechecked as directed by your health care provider.   Take medicines only as directed by your health care provider. Follow the directions carefully. Blood pressure medicines must be taken as prescribed. The medicine does not work as well when you skip doses. Skipping doses also puts you at risk for  problems.  Do not smoke.   Monitor your blood pressure at home as directed by your health care provider. SEEK MEDICAL CARE IF:   You think you are having a reaction to medicines taken.  You have recurrent headaches or feel dizzy.  You have swelling in your ankles.  You have trouble with your vision. SEEK IMMEDIATE MEDICAL CARE IF:  You develop a severe headache or confusion.  You have unusual weakness, numbness, or feel faint.  You have severe chest or abdominal pain.  You vomit repeatedly.  You have trouble breathing. MAKE SURE YOU:   Understand these instructions.  Will watch your condition.  Will get help right away if you are not doing well or get worse.   This information is not intended to replace advice given to you by your health care provider. Make sure you discuss any questions you have with your health care provider.   Document Released: 11/10/2005 Document Revised: 03/27/2015 Document Reviewed: 09/02/2013 Elsevier Interactive Patient Education 2016 [employer]  

## 2016-02-19 NOTE — Progress Notes (Signed)
Pre visit review using our clinic review tool, if applicable. No additional management support is needed unless otherwise documented below in the visit note. 

## 2016-02-28 ENCOUNTER — Telehealth: Payer: Self-pay | Admitting: Family Medicine

## 2016-02-28 NOTE — Telephone Encounter (Signed)
I spoke with [name] and advised of the name change and the new exp date of 02/22/2019.     KP

## 2016-02-28 NOTE — Telephone Encounter (Signed)
Caller name:Emily Relationship to patient:Pharmacy Can be [phone] Pharmacy:[facility]  Reason for call:Trying to refill patients Xanax, stating that Dr Nonda Lou DEA number expired on 02/22/16. Unable to refill.

## 2016-03-18 ENCOUNTER — Encounter: Payer: Self-pay | Admitting: Family Medicine

## 2016-03-18 ENCOUNTER — Ambulatory Visit (INDEPENDENT_AMBULATORY_CARE_PROVIDER_SITE_OTHER): Payer: Managed Care, Other (non HMO) | Admitting: Family Medicine

## 2016-03-18 ENCOUNTER — Other Ambulatory Visit (HOSPITAL_COMMUNITY)
Admission: RE | Admit: 2016-03-18 | Discharge: 2016-03-18 | Disposition: A | Discharge status: Home / Self Care | Payer: Managed Care, Other (non HMO) | Source: Ambulatory Visit | Attending: Family Medicine | Admitting: Family Medicine

## 2016-03-18 VITALS — BP 120/84 | HR 68 | Temp 98.3°F | Ht 63.0 in | Wt 207.0 lb

## 2016-03-18 DIAGNOSIS — I10 Essential (primary) hypertension: Secondary | ICD-10-CM | POA: Diagnosis not present

## 2016-03-18 DIAGNOSIS — Z202 Contact with and (suspected) exposure to infections with a predominantly sexual mode of transmission: Secondary | ICD-10-CM | POA: Diagnosis not present

## 2016-03-18 DIAGNOSIS — N760 Acute vaginitis: Secondary | ICD-10-CM | POA: Diagnosis present

## 2016-03-18 DIAGNOSIS — Z113 Encounter for screening for infections with a predominantly sexual mode of transmission: Secondary | ICD-10-CM | POA: Diagnosis present

## 2016-03-18 NOTE — Progress Notes (Signed)
Pre visit review using our clinic review tool, if applicable. No additional management support is needed unless otherwise documented below in the visit note. 

## 2016-03-18 NOTE — Progress Notes (Signed)
Patient ID: [name], female    DOB: 1971/01/14  Age: 45 y.o. MRN: [id number]    Subjective:  Subjective HPI [name] presents for bp f/u and she is asking for std testing.  No other complaints.    Review of Systems  Constitutional: Negative for diaphoresis, appetite change, fatigue and unexpected weight change.  Eyes: Negative for pain, redness and visual disturbance.  Respiratory: Negative for cough, chest tightness, shortness of breath and wheezing.   Cardiovascular: Negative for chest pain, palpitations and leg swelling.  Endocrine: Negative for cold intolerance, heat intolerance, polydipsia, polyphagia and polyuria.  Genitourinary: Negative for dysuria, frequency and difficulty urinating.  Neurological: Negative for dizziness, light-headedness, numbness and headaches.    History Past Medical History  Diagnosis Date  . Allergy   . Asthma   . Hypertension     pulmonary  . Herpes genitalia   . H/O: myomectomy   . GERD (gastroesophageal reflux disease)     no meds  . Heart murmur   . TIA (transient ischemic attack)   . Refusal of blood transfusions as patient is Jehovah's Witness   . Hearing impaired     can hear on left side only    She has past surgical history that includes Myomectomy (2004  ); Ovarian cyst removal (2010); Breast lumpectomy (01/06/11); Robotic assisted lap vaginal hysterectomy; Cystoscopy (05/19/2012); and Robotic assisted laparoscopic ovarian cystectomy (Right, 03/20/2015).   Her family history includes Arthritis in her mother; Asthma in her mother; Cancer (age of onset: 51) in her other; Cancer (age of onset: 19) in her maternal grandmother; Diabetes in her brother; Heart attack in her paternal grandfather; Heart disease in her maternal grandmother; Hypertension in her father; Migraines in her sister.She reports that she has never smoked. She has never used smokeless tobacco. She reports that she does not drink alcohol or use illicit  drugs.  Current Outpatient Prescriptions on File Prior to Visit  Medication Sig Dispense Refill  . albuterol (PROVENTIL HFA;VENTOLIN HFA) 108 (90 Base) MCG/ACT inhaler Inhale 2 puffs into the lungs every 6 (six) hours as needed for wheezing. 1 Inhaler 2  . ALPRAZolam (XANAX) 0.25 MG tablet Take 1 tablet (0.25 mg total) by mouth 3 (three) times daily as needed for anxiety or sleep. 30 tablet 0  . Ascorbic Acid (VITAMIN C) 1000 MG tablet Take 1,000 mg by mouth daily.    Marland Kitchen aspirin 325 MG tablet Take 1 tablet (325 mg total) by mouth daily.    . cetirizine (ZYRTEC) 10 MG tablet Take 10 mg by mouth daily.    . cholecalciferol (VITAMIN D) 1000 UNITS tablet Take 1,000 Units by mouth daily.    Marland Kitchen ibuprofen (ADVIL,MOTRIN) 600 MG tablet Take 600 mg by mouth every 6 (six) hours as needed for headache or mild pain.     Marland Kitchen lisinopril (PRINIVIL,ZESTRIL) 10 MG tablet Take 1 tablet (10 mg total) by mouth daily. 90 tablet 3  . metoprolol (LOPRESSOR) 100 MG tablet Take 1 tablet (100 mg total) by mouth 2 (two) times daily. 180 tablet 3  . Multiple Vitamins-Calcium (ONE-A-DAY WOMENS PO) Take 1 tablet by mouth daily.    . nitroGLYCERIN (MINITRAN) 0.2 mg/hr patch Apply 1/4th patch to affected shoulder, change daily 30 patch 1  . omeprazole (PRILOSEC) 40 MG capsule Take 1 capsule (40 mg total) by mouth daily. 30 capsule 3  . spironolactone (ALDACTONE) 25 MG tablet Take 1 tablet (25 mg total) by mouth daily. 30 tablet 5  . valACYclovir (  VALTREX) 500 MG tablet Take 500 mg by mouth daily.      No current facility-administered medications on file prior to visit.     Objective:  Objective Physical Exam  Constitutional: She is oriented to person, place, and time. She appears well-developed and well-nourished.  HENT:  Head: Normocephalic and atraumatic.  Eyes: Conjunctivae and EOM are normal.  Neck: Normal range of motion. Neck supple. No JVD present. Carotid bruit is not present. No thyromegaly present.   Cardiovascular: Normal rate, regular rhythm and normal heart sounds.   No murmur heard. Pulmonary/Chest: Effort normal and breath sounds normal. No respiratory distress. She has no wheezes. She has no rales. She exhibits no tenderness.  Musculoskeletal: She exhibits no edema.  Neurological: She is alert and oriented to person, place, and time.  Psychiatric: She has a normal mood and affect. Her behavior is normal. Thought content normal.  Nursing note and vitals reviewed.  BP 120/84 mmHg  Pulse 68  Temp(Src) 98.3 F (36.8 C) (Oral)  Ht 5\' 3"  (1.6 m)  Wt 207 lb (93.895 kg)  BMI 36.68 kg/m2  SpO2 98%  LMP 04/18/2012 Wt Readings from Last 3 Encounters:  03/18/16 207 lb (93.895 kg)  02/19/16 204 lb 12.8 oz (92.897 kg)  02/01/16 202 lb (91.627 kg)     Lab Results  Component Value Date   WBC 9.3 01/04/2016   HGB 12.4 01/04/2016   HCT 38.2 01/04/2016   PLT 323 01/04/2016   GLUCOSE 107* 01/04/2016   CHOL 168 03/06/2015   TRIG 79.0 03/06/2015   HDL 60.10 03/06/2015   LDLCALC 92 03/06/2015   ALT 21 01/04/2016   AST 21 01/04/2016   NA 139 01/04/2016   K 4.0 01/04/2016   CL 102 01/04/2016   CREATININE 0.90 01/04/2016   BUN 12 01/04/2016   CO2 28 01/04/2016   TSH 0.56 03/06/2015   INR 1.11 11/09/2012   HGBA1C 5.1 11/10/2012    Dg Chest 2 View  01/04/2016  CLINICAL DATA:  Mid chest pain starting today, cough, wheezing, shortness of breath, nausea and vomiting. EXAM: CHEST  2 VIEW COMPARISON:  Chest x-ray dated 02/14/2014. FINDINGS: Cardiomediastinal silhouette is normal in size and configuration. Lungs are clear. Lung volumes are normal. No evidence of pneumonia. No pleural effusion. No pneumothorax. Osseous and soft tissue structures about the chest are unremarkable. IMPRESSION: No active cardiopulmonary disease. Electronically Signed   By: [name] M.D.   On: 01/04/2016 19:55   Nm Myocar Multi W/spect W/wall Motion / Ef  01/05/2016   Blood pressure demonstrated a  hypertensive response to exercise.  No T wave inversion was noted during stress.  Defect 1: There is a small defect of mild severity present in the mid anterior and apical anterior location.  This is a low risk study.  Nuclear stress EF: 62%.  The left ventricular ejection fraction is hyperdynamic (>65%).      Assessment & Plan:  Plan I am having Ms. Cieslewicz maintain her Multiple Vitamins-Calcium (ONE-A-DAY WOMENS PO), aspirin, valACYclovir, vitamin C, cetirizine, cholecalciferol, ibuprofen, nitroGLYCERIN, ALPRAZolam, lisinopril, spironolactone, omeprazole, metoprolol, and albuterol.  No orders of the defined types were placed in this encounter.    Problem List Items Addressed This Visit      Unprioritized   Essential hypertension (Chronic)    Stable Cont metoprolol, lisinopril       Other Visit Diagnoses    Possible exposure to STD    -  Primary    Relevant Orders  Urine cytology ancillary only    Urine cytology ancillary only    HSV 2 antibody, IgG    RPR    HIV antibody       Follow-up: Return in about 6 months (around 09/17/2016), or if symptoms worsen or fail to improve.  Ann Held, DO

## 2016-03-18 NOTE — Patient Instructions (Signed)
Hypertension Hypertension, commonly called high blood pressure, is when the force of blood pumping through your arteries is too strong. Your arteries are the blood vessels that carry blood from your heart throughout your body. A blood pressure reading consists of a higher number over a lower number, such as 110/72. The higher number (systolic) is the pressure inside your arteries when your heart pumps. The lower number (diastolic) is the pressure inside your arteries when your heart relaxes. Ideally you want your blood pressure below 120/80. Hypertension forces your heart to work harder to pump blood. Your arteries may become narrow or stiff. Having untreated or uncontrolled hypertension can cause heart attack, stroke, kidney disease, and other problems. RISK FACTORS Some risk factors for high blood pressure are controllable. Others are not.  Risk factors you cannot control include:   Race. You may be at higher risk if you are African American.  Age. Risk increases with age.  Gender. Men are at higher risk than women before age 45 years. After age 65, women are at higher risk than men. Risk factors you can control include:  Not getting enough exercise or physical activity.  Being overweight.  Getting too much fat, sugar, calories, or salt in your diet.  Drinking too much alcohol. SIGNS AND SYMPTOMS Hypertension does not usually cause signs or symptoms. Extremely high blood pressure (hypertensive crisis) may cause headache, anxiety, shortness of breath, and nosebleed. DIAGNOSIS To check if you have hypertension, your health care provider will measure your blood pressure while you are seated, with your arm held at the level of your heart. It should be measured at least twice using the same arm. Certain conditions can cause a difference in blood pressure between your right and left arms. A blood pressure reading that is higher than normal on one occasion does not mean that you need treatment. If  it is not clear whether you have high blood pressure, you may be asked to return on a different day to have your blood pressure checked again. Or, you may be asked to monitor your blood pressure at home for 1 or more weeks. TREATMENT Treating high blood pressure includes making lifestyle changes and possibly taking medicine. Living a healthy lifestyle can help lower high blood pressure. You may need to change some of your habits. Lifestyle changes may include:  Following the DASH diet. This diet is high in fruits, vegetables, and whole grains. It is low in salt, red meat, and added sugars.  Keep your sodium intake below 2,300 mg per day.  Getting at least 30-45 minutes of aerobic exercise at least 4 times per week.  Losing weight if necessary.  Not smoking.  Limiting alcoholic beverages.  Learning ways to reduce stress. Your health care provider may prescribe medicine if lifestyle changes are not enough to get your blood pressure under control, and if one of the following is true:  You are 18-59 years of age and your systolic blood pressure is above 140.  You are 60 years of age or older, and your systolic blood pressure is above 150.  Your diastolic blood pressure is above 90.  You have diabetes, and your systolic blood pressure is over 140 or your diastolic blood pressure is over 90.  You have kidney disease and your blood pressure is above 140/90.  You have heart disease and your blood pressure is above 140/90. Your personal target blood pressure may vary depending on your medical conditions, your age, and other factors. HOME CARE INSTRUCTIONS    Have your blood pressure rechecked as directed by your health care provider.   Take medicines only as directed by your health care provider. Follow the directions carefully. Blood pressure medicines must be taken as prescribed. The medicine does not work as well when you skip doses. Skipping doses also puts you at risk for  problems.  Do not smoke.   Monitor your blood pressure at home as directed by your health care provider. SEEK MEDICAL CARE IF:   You think you are having a reaction to medicines taken.  You have recurrent headaches or feel dizzy.  You have swelling in your ankles.  You have trouble with your vision. SEEK IMMEDIATE MEDICAL CARE IF:  You develop a severe headache or confusion.  You have unusual weakness, numbness, or feel faint.  You have severe chest or abdominal pain.  You vomit repeatedly.  You have trouble breathing. MAKE SURE YOU:   Understand these instructions.  Will watch your condition.  Will get help right away if you are not doing well or get worse.   This information is not intended to replace advice given to you by your health care provider. Make sure you discuss any questions you have with your health care provider.   Document Released: 11/10/2005 Document Revised: 03/27/2015 Document Reviewed: 09/02/2013 Elsevier Interactive Patient Education 2016 [employer]  

## 2016-03-18 NOTE — Assessment & Plan Note (Signed)
Stable Cont metoprolol, lisinopril

## 2016-03-20 LAB — URINE CYTOLOGY ANCILLARY ONLY
Chlamydia: NEGATIVE
Neisseria Gonorrhea: NEGATIVE
TRICH (WINDOWPATH): NEGATIVE

## 2016-03-24 LAB — URINE CYTOLOGY ANCILLARY ONLY: Candida vaginitis: NEGATIVE

## 2016-03-25 ENCOUNTER — Other Ambulatory Visit (INDEPENDENT_AMBULATORY_CARE_PROVIDER_SITE_OTHER): Payer: Managed Care, Other (non HMO)

## 2016-03-25 DIAGNOSIS — Z202 Contact with and (suspected) exposure to infections with a predominantly sexual mode of transmission: Secondary | ICD-10-CM

## 2016-03-25 NOTE — Addendum Note (Signed)
Addended by: Caffie Pinto on: 03/25/2016 08:55 AM   Modules accepted: Orders

## 2016-03-26 ENCOUNTER — Other Ambulatory Visit: Payer: Self-pay

## 2016-03-26 LAB — RPR

## 2016-03-26 LAB — HIV ANTIBODY (ROUTINE TESTING W REFLEX): HIV 1&2 Ab, 4th Generation: NONREACTIVE

## 2016-03-26 MED ORDER — METRONIDAZOLE 500 MG PO TABS: 500.0000 mg | ORAL_TABLET | Freq: Two times a day (BID) | ORAL | Status: DC

## 2016-04-09 ENCOUNTER — Encounter: Payer: Self-pay | Admitting: Family Medicine

## 2016-04-11 ENCOUNTER — Telehealth: Payer: Self-pay | Admitting: Family Medicine

## 2016-04-11 NOTE — Telephone Encounter (Signed)
Relation to [id number] Call back [phone] Pharmacy: [facility] [place], [place]. [phone] (Phone) [phone] (Fax)         Reason for call:  Patient experiencing swollen feet and would like for you to prescribe a prescription.

## 2016-04-14 NOTE — Telephone Encounter (Signed)
noted 

## 2016-04-14 NOTE — Telephone Encounter (Signed)
Called to follow up with patient.  Pt says she has been experiencing headaches and swelling in feet, but is unable to talk at this time.  Pt states she will call back tomorrow morning.  Pt was advised to schedule an office visit.  She said "ok."    Will follow up with patient tomorrow morning.  Message routed to PCP for FYI.

## 2016-04-15 NOTE — Telephone Encounter (Signed)
Appointment scheduled 04/18/16.

## 2016-04-18 ENCOUNTER — Encounter: Payer: Self-pay | Admitting: Family Medicine

## 2016-04-18 ENCOUNTER — Ambulatory Visit (INDEPENDENT_AMBULATORY_CARE_PROVIDER_SITE_OTHER): Payer: Managed Care, Other (non HMO) | Admitting: Family Medicine

## 2016-04-18 VITALS — BP 158/90 | HR 73 | Temp 99.4°F | Ht 63.0 in | Wt 201.2 lb

## 2016-04-18 DIAGNOSIS — R609 Edema, unspecified: Secondary | ICD-10-CM

## 2016-04-18 DIAGNOSIS — I10 Essential (primary) hypertension: Secondary | ICD-10-CM

## 2016-04-18 DIAGNOSIS — R600 Localized edema: Secondary | ICD-10-CM | POA: Diagnosis not present

## 2016-04-18 MED ORDER — SPIRONOLACTONE 25 MG PO TABS: 25.0000 mg | ORAL_TABLET | Freq: Every day | ORAL | Status: DC

## 2016-04-18 NOTE — Progress Notes (Signed)
Pre visit review using our clinic review tool, if applicable. No additional management support is needed unless otherwise documented below in the visit note. 

## 2016-04-18 NOTE — Patient Instructions (Signed)
Edema °Edema is an abnormal buildup of fluids in your body tissues. Edema is somewhat dependent on gravity to pull the fluid to the lowest place in your body. That makes the condition more common in the legs and thighs (lower extremities). Painless swelling of the feet and ankles is common and becomes more likely as you get older. It is also common in looser tissues, like around your eyes.  °When the affected area is squeezed, the fluid may move out of that spot and leave a dent for a few moments. This dent is called pitting.  °CAUSES  °There are many possible causes of edema. Eating too much salt and being on your feet or sitting for a long time can cause edema in your legs and ankles. Hot weather may make edema worse. Common medical causes of edema include: °· Heart failure. °· Liver disease. °· Kidney disease. °· Weak blood vessels in your legs. °· Cancer. °· An injury. °· Pregnancy. °· Some medications. °· Obesity.  °SYMPTOMS  °Edema is usually painless. Your skin may look swollen or shiny.  °DIAGNOSIS  °Your health care provider may be able to diagnose edema by asking about your medical history and doing a physical exam. You may need to have tests such as X-rays, an electrocardiogram, or blood tests to check for medical conditions that may cause edema.  °TREATMENT  °Edema treatment depends on the cause. If you have heart, liver, or kidney disease, you need the treatment appropriate for these conditions. General treatment may include: °· Elevation of the affected body part above the level of your heart. °· Compression of the affected body part. Pressure from elastic bandages or support stockings squeezes the tissues and forces fluid back into the blood vessels. This keeps fluid from entering the tissues. °· Restriction of fluid and salt intake. °· Use of a water pill (diuretic). These medications are appropriate only for some types of edema. They pull fluid out of your body and make you urinate more often. This  gets rid of fluid and reduces swelling, but diuretics can have side effects. Only use diuretics as directed by your health care provider. °HOME CARE INSTRUCTIONS  °· Keep the affected body part above the level of your heart when you are lying down.   °· Do not sit still or stand for prolonged periods.   °· Do not put anything directly under your knees when lying down. °· Do not wear constricting clothing or garters on your upper legs.   °· Exercise your legs to work the fluid back into your blood vessels. This may help the swelling go down.   °· Wear elastic bandages or support stockings to reduce ankle swelling as directed by your health care provider.   °· Eat a low-salt diet to reduce fluid if your health care provider recommends it.   °· Only take medicines as directed by your health care provider.  °SEEK MEDICAL CARE IF:  °· Your edema is not responding to treatment. °· You have heart, liver, or kidney disease and notice symptoms of edema. °· You have edema in your legs that does not improve after elevating them.   °· You have sudden and unexplained weight gain. °SEEK IMMEDIATE MEDICAL CARE IF:  °· You develop shortness of breath or chest pain.   °· You cannot breathe when you lie down. °· You develop pain, redness, or warmth in the swollen areas.   °· You have heart, liver, or kidney disease and suddenly get edema. °· You have a fever and your symptoms suddenly get worse. °MAKE SURE YOU:  °·   Understand these instructions. °· Will watch your condition. °· Will get help right away if you are not doing well or get worse. °  °This information is not intended to replace advice given to you by your health care provider. Make sure you discuss any questions you have with your health care provider. °  °Document Released: 11/10/2005 Document Revised: 12/01/2014 Document Reviewed: 09/02/2013 °Elsevier Interactive Patient Education ©2016 [employer]vier Inc[employer] ° °

## 2016-04-18 NOTE — Progress Notes (Signed)
Patient ID: [name], female    DOB: January 20, 1971  Age: 45 y.o. MRN: [id number]    Subjective:  Subjective HPI [name] presents for f/u bp and edema. No cp, no sob.   Review of Systems  Constitutional: Negative for fever, chills, diaphoresis, activity change, appetite change, fatigue and unexpected weight change.  Eyes: Negative for pain, redness and visual disturbance.  Respiratory: Negative for cough, chest tightness, shortness of breath and wheezing.   Cardiovascular: Positive for leg swelling. Negative for chest pain and palpitations.  Gastrointestinal: Negative for abdominal pain and abdominal distention.  Endocrine: Negative for cold intolerance, heat intolerance, polydipsia, polyphagia and polyuria.  Genitourinary: Negative for dysuria, urgency, frequency, hematuria, flank pain, vaginal discharge, difficulty urinating, genital sores, vaginal pain, menstrual problem, pelvic pain and dyspareunia.  Musculoskeletal: Negative for back pain.  Neurological: Negative for dizziness, light-headedness, numbness and headaches.    History Past Medical History  Diagnosis Date  . Allergy   . Asthma   . Hypertension     pulmonary  . Herpes genitalia   . H/O: myomectomy   . GERD (gastroesophageal reflux disease)     no meds  . Heart murmur   . TIA (transient ischemic attack)   . Refusal of blood transfusions as patient is Jehovah's Witness   . Hearing impaired     can hear on left side only    She has past surgical history that includes Myomectomy (2004  ); Ovarian cyst removal (2010); Breast lumpectomy (01/06/11); Robotic assisted lap vaginal hysterectomy; Cystoscopy (05/19/2012); and Robotic assisted laparoscopic ovarian cystectomy (Right, 03/20/2015).   Her family history includes Arthritis in her mother; Asthma in her mother; Cancer (age of onset: 70) in her other; Cancer (age of onset: 60) in her maternal grandmother; Diabetes in her brother; Heart attack in her paternal  grandfather; Heart disease in her maternal grandmother; Hypertension in her father; Migraines in her sister.She reports that she has never smoked. She has never used smokeless tobacco. She reports that she does not drink alcohol or use illicit drugs.  Current Outpatient Prescriptions on File Prior to Visit  Medication Sig Dispense Refill  . albuterol (PROVENTIL HFA;VENTOLIN HFA) 108 (90 Base) MCG/ACT inhaler Inhale 2 puffs into the lungs every 6 (six) hours as needed for wheezing. 1 Inhaler 2  . ALPRAZolam (XANAX) 0.25 MG tablet Take 1 tablet (0.25 mg total) by mouth 3 (three) times daily as needed for anxiety or sleep. 30 tablet 0  . Ascorbic Acid (VITAMIN C) 1000 MG tablet Take 1,000 mg by mouth daily.    Marland Kitchen aspirin 325 MG tablet Take 1 tablet (325 mg total) by mouth daily.    . cetirizine (ZYRTEC) 10 MG tablet Take 10 mg by mouth daily.    . cholecalciferol (VITAMIN D) 1000 UNITS tablet Take 1,000 Units by mouth daily.    Marland Kitchen ibuprofen (ADVIL,MOTRIN) 600 MG tablet Take 600 mg by mouth every 6 (six) hours as needed for headache or mild pain.     Marland Kitchen lisinopril (PRINIVIL,ZESTRIL) 10 MG tablet Take 1 tablet (10 mg total) by mouth daily. 90 tablet 3  . metoprolol (LOPRESSOR) 100 MG tablet Take 1 tablet (100 mg total) by mouth 2 (two) times daily. 180 tablet 3  . Multiple Vitamins-Calcium (ONE-A-DAY WOMENS PO) Take 1 tablet by mouth daily.    . nitroGLYCERIN (MINITRAN) 0.2 mg/hr patch Apply 1/4th patch to affected shoulder, change daily 30 patch 1  . omeprazole (PRILOSEC) 40 MG capsule Take 1 capsule (40  mg total) by mouth daily. 30 capsule 3  . valACYclovir (VALTREX) 500 MG tablet Take 500 mg by mouth daily.      No current facility-administered medications on file prior to visit.     Objective:  Objective Physical Exam  Constitutional: She is oriented to person, place, and time. She appears well-developed and well-nourished.  HENT:  Head: Normocephalic and atraumatic.  Eyes: Conjunctivae and  EOM are normal.  Neck: Normal range of motion. Neck supple. No JVD present. Carotid bruit is not present. No thyromegaly present.  Cardiovascular: Normal rate, regular rhythm and normal heart sounds.   No murmur heard. Pulmonary/Chest: Effort normal and breath sounds normal. No respiratory distress. She has no wheezes. She has no rales. She exhibits no tenderness.  Musculoskeletal: She exhibits edema. She exhibits no tenderness.  Neurological: She is alert and oriented to person, place, and time.  Psychiatric: She has a normal mood and affect. Her behavior is normal. Thought content normal.  Nursing note and vitals reviewed.  BP 158/90 mmHg  Pulse 73  Temp(Src) 99.4 F (37.4 C) (Oral)  Ht 5\' 3"  (1.6 m)  Wt 201 lb 3.2 oz (91.264 kg)  BMI 35.65 kg/m2  SpO2 99%  LMP 04/18/2012 Wt Readings from Last 3 Encounters:  04/18/16 201 lb 3.2 oz (91.264 kg)  03/18/16 207 lb (93.895 kg)  02/19/16 204 lb 12.8 oz (92.897 kg)     Lab Results  Component Value Date   WBC 9.3 01/04/2016   HGB 12.4 01/04/2016   HCT 38.2 01/04/2016   PLT 323 01/04/2016   GLUCOSE 107* 01/04/2016   CHOL 168 03/06/2015   TRIG 79.0 03/06/2015   HDL 60.10 03/06/2015   LDLCALC 92 03/06/2015   ALT 21 01/04/2016   AST 21 01/04/2016   NA 139 01/04/2016   K 4.0 01/04/2016   CL 102 01/04/2016   CREATININE 0.90 01/04/2016   BUN 12 01/04/2016   CO2 28 01/04/2016   TSH 0.56 03/06/2015   INR 1.11 11/09/2012   HGBA1C 5.1 11/10/2012    No results found.   Assessment & Plan:  Plan I have discontinued [name]'s metroNIDAZOLE. I am also having her maintain her Multiple Vitamins-Calcium (ONE-A-DAY WOMENS PO), aspirin, valACYclovir, vitamin C, cetirizine, cholecalciferol, ibuprofen, nitroGLYCERIN, ALPRAZolam, lisinopril, omeprazole, metoprolol, albuterol, and spironolactone.  Meds ordered this encounter  Medications  . spironolactone (ALDACTONE) 25 MG tablet    Sig: Take 1 tablet (25 mg total) by mouth daily.     Dispense:  90 tablet    Refill:  3    Problem List Items Addressed This Visit    Edema extremities    Elevated legs Pt is on spironolactone prn--- take daily      Essential hypertension - Primary (Chronic)   Relevant Medications   spironolactone (ALDACTONE) 25 MG tablet    Other Visit Diagnoses    Bilateral edema of lower extremity           Follow-up: Return in about 3 weeks (around 05/09/2016), or if symptoms worsen or fail to improve, for annual exam, fasting.  Ann Held, DO

## 2016-04-27 DIAGNOSIS — R600 Localized edema: Secondary | ICD-10-CM | POA: Insufficient documentation

## 2016-04-27 NOTE — Assessment & Plan Note (Signed)
Elevated legs Pt is on spironolactone prn--- take daily

## 2016-05-10 ENCOUNTER — Other Ambulatory Visit: Payer: Self-pay | Admitting: Family Medicine

## 2016-05-12 NOTE — Telephone Encounter (Signed)
Last seen 04/18/16 and filled 01/08/16 #30  Please advise     KP

## 2016-06-23 ENCOUNTER — Other Ambulatory Visit: Payer: Self-pay | Admitting: Family Medicine

## 2016-06-23 NOTE — Telephone Encounter (Signed)
Last seen 04/18/16 and filled 05/12/16 #30   Please advise     KP

## 2016-07-14 ENCOUNTER — Encounter: Payer: Self-pay | Admitting: Medical

## 2016-07-14 ENCOUNTER — Ambulatory Visit (HOSPITAL_BASED_OUTPATIENT_CLINIC_OR_DEPARTMENT_OTHER)
Admission: RE | Admit: 2016-07-14 | Discharge: 2016-07-14 | Disposition: A | Discharge status: Home / Self Care | Payer: Managed Care, Other (non HMO) | Source: Ambulatory Visit | Attending: Medical | Admitting: Medical

## 2016-07-14 ENCOUNTER — Ambulatory Visit (INDEPENDENT_AMBULATORY_CARE_PROVIDER_SITE_OTHER): Payer: Managed Care, Other (non HMO) | Admitting: Medical

## 2016-07-14 VITALS — BP 123/75 | HR 71 | Temp 98.2°F | Ht 63.0 in | Wt 206.4 lb

## 2016-07-14 DIAGNOSIS — M6530 Trigger finger, unspecified finger: Secondary | ICD-10-CM

## 2016-07-14 NOTE — Patient Instructions (Addendum)
For your rt had with trigger finger presentation to rt 4th digit will refer you to hand specialist and get xray today.  You may find extended digit feels better and buddy tape to next.  Follow up her as needed post hand specialist eval.  Trigger Finger Trigger finger (digital tendinitis and stenosing tenosynovitis) is a common disorder that causes an often painful catching of the fingers or thumb. It occurs as a clicking, snapping, or locking of a finger in the palm of the hand. This is caused by a problem with the tendons that flex or bend the fingers sliding smoothly through their sheaths. The condition may occur in any finger or a couple fingers at the same time.  The finger may lock with the finger curled or suddenly straighten out with a snap. This is more common in patients with rheumatoid arthritis and diabetes. Left untreated, the condition may get worse to the point where the finger becomes locked in flexion, like making a fist, or less commonly locked with the finger straightened out. CAUSES   Inflammation and scarring that lead to swelling around the tendon sheath.  Repeated or forceful movements.  Rheumatoid arthritis, an autoimmune disease that affects joints.  Gout.  Diabetes mellitus. SIGNS AND SYMPTOMS  Soreness and swelling of your finger.  A painful clicking or snapping as you bend and straighten your finger. DIAGNOSIS  Your health care provider will do a physical exam of your finger to diagnose trigger finger. TREATMENT   Splinting for 6-8 weeks may be helpful.  Nonsteroidal anti-inflammatory medicines (NSAIDs) can help to relieve the pain and inflammation.  Cortisone injections, along with splinting, may speed up recovery. Several injections may be required. Cortisone may give relief after one injection.  Surgery is another treatment that may be used if conservative treatments do not work. Surgery can be minor, without incisions (a cut does not have to be made),  and can be done with a needle through the skin.  Other surgical choices involve an open procedure in which the surgeon opens the hand through a small incision and cuts the pulley so the tendon can again slide smoothly. Your hand will still work fine. HOME CARE INSTRUCTIONS  Apply ice to the injured area, twice per day:  Put ice in a plastic bag.  Place a towel between your skin and the bag.  Leave the ice on for 20 minutes, 3-4 times a day.  Rest your hand often. MAKE SURE YOU:   Understand these instructions.  Will watch your condition.  Will get help right away if you are not doing well or get worse.   This information is not intended to replace advice given to you by your health care provider. Make sure you discuss any questions you have with your health care provider.   Document Released: 08/30/2004 Document Revised: 07/13/2013 Document Reviewed: 04/12/2013 Elsevier Interactive Patient Education [employer].

## 2016-07-14 NOTE — Progress Notes (Signed)
Pre visit review using our clinic tool,if applicable. No additional management support is needed unless otherwise documented below in the visit note.  

## 2016-07-14 NOTE — Progress Notes (Signed)
Subjective:    Patient ID: [name], female    DOB: 07/01/1971, 45 y.o.   MRN: [id number]  HPI  Pt in with rt 4th digit/ring finger pain for 4 days. 1st gradually increase of pain. No trauma or falls. Feels like pops. If she flexes hand will get locked sensation.  Pt just taking some ibuprofen for pain. Pt has not taken any today.   Pt is right handed. Pt has not reported any repetitive activity.   Review of Systems  Constitutional: Negative for chills, fatigue and fever.  Respiratory: Negative for cough, chest tightness, shortness of breath and wheezing.   Musculoskeletal:       Rt hand finger feel like locking.   Skin: Negative for rash.  Hematological: Negative for adenopathy. Does not bruise/bleed easily.  Psychiatric/Behavioral: Negative for behavioral problems and confusion.   Past Medical History:  Diagnosis Date  . Allergy   . Asthma   . GERD (gastroesophageal reflux disease)    no meds  . H/O: myomectomy   . Hearing impaired    can hear on left side only  . Heart murmur   . Herpes genitalia   . Hypertension    pulmonary  . Refusal of blood transfusions as patient is Jehovah's Witness   . TIA (transient ischemic attack)      Social History   Social History  . Marital status: Single    Spouse name: N/A  . Number of children: N/A  . Years of education: N/A   Occupational History  . Not on file.   Social History Main Topics  . Smoking status: Never Smoker  . Smokeless tobacco: Never Used  . Alcohol use No  . Drug use: No  . Sexual activity: Yes    Partners: Male    Birth control/ protection: Surgical     Comment: hyst   Other Topics Concern  . Not on file   Social History Narrative   Exercise---walk, zumba, Conservation officer, nature video    Past Surgical History:  Procedure Laterality Date  . BREAST LUMPECTOMY  01/06/11  . CYSTOSCOPY  05/19/2012   Procedure: CYSTOSCOPY;  Surgeon: Alwyn Pea, MD;  Location: [facility];  Service: Gynecology;   Laterality: N/A;  . MYOMECTOMY  2004     2010 robotic  . OVARIAN CYST REMOVAL  2010   x2  . ROBOTIC ASSISTED LAP VAGINAL HYSTERECTOMY    . ROBOTIC ASSISTED LAPAROSCOPIC OVARIAN CYSTECTOMY Right 03/20/2015   Procedure: ROBOTIC ASSISTED LAPAROSCOPIC Salpingectomy OVARIAN CYSTECTOMY, pelvic washings, excision of peritoneal pseudocyst.;  Surgeon: Delsa Bern, MD;  Location: [facility];  Service: Gynecology;  Laterality: Right;    Family History  Problem Relation Age of Onset  . Asthma Mother   . Arthritis Mother   . Hypertension Father   . Heart disease Maternal Grandmother     PCI  . Cancer Maternal Grandmother 45    breast  . Cancer Other 60    breast and possible colon  . Diabetes Brother   . Migraines Sister   . Heart attack Paternal Grandfather     before age 56    No Known Allergies  Current Outpatient Prescriptions on File Prior to Visit  Medication Sig Dispense Refill  . albuterol (PROVENTIL HFA;VENTOLIN HFA) 108 (90 Base) MCG/ACT inhaler Inhale 2 puffs into the lungs every 6 (six) hours as needed for wheezing. 1 Inhaler 2  . ALPRAZolam (XANAX) 0.25 MG tablet TAKE ONE TABLET BY MOUTH THREE TIMES DAILY AS NEEDED  FOR ANXIETY OR  SLEEP 30 tablet 0  . Ascorbic Acid (VITAMIN C) 1000 MG tablet Take 1,000 mg by mouth daily.    Marland Kitchen aspirin 325 MG tablet Take 1 tablet (325 mg total) by mouth daily.    . cetirizine (ZYRTEC) 10 MG tablet Take 10 mg by mouth daily.    . cholecalciferol (VITAMIN D) 1000 UNITS tablet Take 1,000 Units by mouth daily.    Marland Kitchen ibuprofen (ADVIL,MOTRIN) 600 MG tablet Take 600 mg by mouth every 6 (six) hours as needed for headache or mild pain.     . metoprolol (LOPRESSOR) 100 MG tablet Take 1 tablet (100 mg total) by mouth 2 (two) times daily. 180 tablet 3  . Multiple Vitamins-Calcium (ONE-A-DAY WOMENS PO) Take 1 tablet by mouth daily.    . nitroGLYCERIN (MINITRAN) 0.2 mg/hr patch Apply 1/4th patch to affected shoulder, change daily 30 patch 1  . spironolactone  (ALDACTONE) 25 MG tablet Take 1 tablet (25 mg total) by mouth daily. 90 tablet 3  . valACYclovir (VALTREX) 500 MG tablet Take 500 mg by mouth daily.     Marland Kitchen lisinopril (PRINIVIL,ZESTRIL) 10 MG tablet Take 1 tablet (10 mg total) by mouth daily. 90 tablet 3  . omeprazole (PRILOSEC) 40 MG capsule Take 1 capsule (40 mg total) by mouth daily. (Patient not taking: Reported on 07/14/2016) 30 capsule 3   No current facility-administered medications on file prior to visit.     BP 123/75   Pulse 71   Temp 98.2 F (36.8 C) (Oral)   Ht 5\' 3"  (1.6 m)   Wt 206 lb 6.4 oz (93.6 kg)   LMP 04/18/2012   SpO2 100%   BMI 36.56 kg/m       Objective:   Physical Exam  General- No acute distress. Pleasant patient.  Rt hand- 4th digit when hand flexed she needs to manually extend digit and will feel pop. At pip joint.  Rt hand- no pain on palpation of her palm.         Assessment & Plan:  For your rt had with trigger finger presentation to rt 4th digit will refer you to hand specialist and get xray today.  You may find extended digit feels better and buddy tape to next.  Follow up her as needed post hand specialist eval.

## 2016-08-22 ENCOUNTER — Other Ambulatory Visit: Payer: Self-pay | Admitting: Family Medicine

## 2016-08-22 NOTE — Telephone Encounter (Signed)
Last seen 04/18/2016 and filled 06/23/16 #30    Please advise     KP

## 2016-10-08 ENCOUNTER — Ambulatory Visit (INDEPENDENT_AMBULATORY_CARE_PROVIDER_SITE_OTHER): Payer: Managed Care, Other (non HMO) | Admitting: Family Medicine

## 2016-10-08 ENCOUNTER — Encounter: Payer: Self-pay | Admitting: Family Medicine

## 2016-10-08 VITALS — BP 124/76 | HR 68 | Temp 97.7°F | Ht 63.0 in | Wt 207.4 lb

## 2016-10-08 DIAGNOSIS — R059 Cough, unspecified: Secondary | ICD-10-CM

## 2016-10-08 DIAGNOSIS — R05 Cough: Secondary | ICD-10-CM

## 2016-10-08 DIAGNOSIS — R491 Aphonia: Secondary | ICD-10-CM

## 2016-10-08 DIAGNOSIS — J189 Pneumonia, unspecified organism: Secondary | ICD-10-CM

## 2016-10-08 LAB — POCT RAPID STREP A (OFFICE): Rapid Strep A Screen: NEGATIVE

## 2016-10-08 LAB — POCT INFLUENZA A/B
INFLUENZA B, POC: NEGATIVE
Influenza A, POC: NEGATIVE

## 2016-10-08 MED ORDER — BENZONATATE 100 MG PO CAPS: 100.0000 mg | ORAL_CAPSULE | Freq: Three times a day (TID) | ORAL | 0 refills | Status: DC | PRN

## 2016-10-08 MED ORDER — AZITHROMYCIN 250 MG PO TABS: ORAL_TABLET | ORAL | 0 refills | Status: DC

## 2016-10-08 NOTE — Progress Notes (Signed)
Pre visit review using our clinic review tool, if applicable. No additional management support is needed unless otherwise documented below in the visit note. 

## 2016-10-08 NOTE — Addendum Note (Signed)
Addended by: Porter Cellar on: 10/08/2016 10:38 AM   Modules accepted: Orders

## 2016-10-08 NOTE — Patient Instructions (Addendum)
Wait 3-4 days before taking the antibiotic. If you are improving, do not take the antibiotic.  Practice good hand hygiene.  Drink lots of fluids.

## 2016-10-08 NOTE — Progress Notes (Signed)
Chief Complaint  Patient presents with  . Cough    Pt reports sore throat, lost of voice, cough, runny nose, sneezing, and shortness of breath x1 week    [name] here for URI complaints.  Duration: 1 week  Associated symptoms: ST, losing voice, cough, runny nose, muscle aches, chills, sweats, questionable fever Denies: ear pain/drainage, nasal congestion, itchy/watery eyes, measured fever Treatment to date: Pseudophed, OTC remedies; no luck, takes Zyrtec routinely Sick contacts: Yes  ROS:  Const: +chills HEENT: As noted in HPI Lungs: No SOB, +cough  Past Medical History:  Diagnosis Date  . Allergy   . Asthma   . GERD (gastroesophageal reflux disease)    no meds  . H/O: myomectomy   . Hearing impaired    can hear on left side only  . Heart murmur   . Herpes genitalia   . Hypertension    pulmonary  . Refusal of blood transfusions as patient is Jehovah's Witness   . TIA (transient ischemic attack)    Family History  Problem Relation Age of Onset  . Asthma Mother   . Arthritis Mother   . Hypertension Father   . Heart disease Maternal Grandmother     PCI  . Cancer Maternal Grandmother 74    breast  . Cancer Other 60    breast and possible colon  . Diabetes Brother   . Migraines Sister   . Heart attack Paternal Grandfather     before age 35    BP 124/76 (BP Location: Left Arm, Patient Position: Sitting, Cuff Size: Small)   Pulse 68   Temp 97.7 F (36.5 C) (Oral)   Ht 5\' 3"  (1.6 m)   Wt 207 lb 6.4 oz (94.1 kg)   LMP 04/18/2012   SpO2 99%   BMI 36.74 kg/m  General: Awake, alert, appears stated age HEENT: AT, [place], ears patent b/l and TM's neg, nares patent w/o discharge, pharynx pink and without exudates, MMM Neck: No masses or asymmetry Heart: RRR, no murmurs, no bruits Lungs: CTAB, no accessory muscle use Psych: Age appropriate judgment and insight, normal mood and affect  Walking pneumonia - Plan: azithromycin (ZITHROMAX) 250 MG tablet, benzonatate  (TESSALON) 100 MG capsule  Orders as above. Strep and flu neg. Supportive care +antitussive for next 3-4 days. If no improvement, take abx.  F/u prn. Pt voiced understanding and agreement to the plan.  [place], DO 10/08/16 10:25 AM

## 2016-10-21 ENCOUNTER — Encounter: Payer: Self-pay | Admitting: Family

## 2016-10-21 ENCOUNTER — Ambulatory Visit (INDEPENDENT_AMBULATORY_CARE_PROVIDER_SITE_OTHER): Payer: Managed Care, Other (non HMO) | Admitting: Family

## 2016-10-21 ENCOUNTER — Ambulatory Visit (HOSPITAL_BASED_OUTPATIENT_CLINIC_OR_DEPARTMENT_OTHER)
Admission: RE | Admit: 2016-10-21 | Discharge: 2016-10-21 | Disposition: A | Discharge status: Home / Self Care | Payer: Managed Care, Other (non HMO) | Source: Ambulatory Visit | Attending: Family | Admitting: Family

## 2016-10-21 VITALS — BP 118/70 | HR 72 | Temp 98.1°F | Resp 18 | Ht 63.0 in | Wt 208.4 lb

## 2016-10-21 DIAGNOSIS — R05 Cough: Secondary | ICD-10-CM | POA: Diagnosis present

## 2016-10-21 DIAGNOSIS — J45901 Unspecified asthma with (acute) exacerbation: Secondary | ICD-10-CM

## 2016-10-21 DIAGNOSIS — R059 Cough, unspecified: Secondary | ICD-10-CM

## 2016-10-21 MED ORDER — PREDNISONE 10 MG PO TABS: ORAL_TABLET | ORAL | 0 refills | Status: DC

## 2016-10-21 MED ORDER — FLUTICASONE PROPIONATE 50 MCG/ACT NA SUSP: 2.0000 | Freq: Every day | NASAL | 2 refills | Status: DC

## 2016-10-21 MED ORDER — ALBUTEROL SULFATE HFA 108 (90 BASE) MCG/ACT IN AERS: 2.0000 | INHALATION_SPRAY | Freq: Four times a day (QID) | RESPIRATORY_TRACT | 2 refills | Status: DC | PRN

## 2016-10-21 NOTE — Patient Instructions (Signed)
Please complete your chest x-ray on the first floor. Begin prednisone taper and albuterol. Use albuterol every 6 hours for the next few days until your asthma symptoms improve. Continue zyrtec, add back in your flonase. Call if new/worsening symptoms or if symptoms are not improved in 3 days.

## 2016-10-21 NOTE — Progress Notes (Signed)
Subjective:    Patient ID: [name], female    DOB: 03/28/1971, 45 y.o.   MRN: [id number]  HPI  [name] is a 45 yr old female who presents today for follow up of her "walking pneumonia." She initially presented on 10/08/16 with 1 week hx of URI symptoms.  She was advised to begin zpak if symptoms did not improve in 3-4 days. She ultimately completed the zpak.  She presents today with ongoing cough/sore throat, sneezing, rhinorrhea and fatigue.  Reports that she is easily winded, but feels like she is not getting enough air into my lungs.  Reports that she has hx of asthma and the wheezing wakes her up at night.  Cold air outside causes worsening.     Review of Systems See HPI  Past Medical History:  Diagnosis Date  . Allergy   . Asthma   . GERD (gastroesophageal reflux disease)    no meds  . H/O: myomectomy   . Hearing impaired    can hear on left side only  . Heart murmur   . Herpes genitalia   . Hypertension    pulmonary  . Refusal of blood transfusions as patient is Jehovah's Witness   . TIA (transient ischemic attack)      Social History   Social History  . Marital status: Single    Spouse name: N/A  . Number of children: N/A  . Years of education: N/A   Occupational History  . Not on file.   Social History Main Topics  . Smoking status: Never Smoker  . Smokeless tobacco: Never Used  . Alcohol use No  . Drug use: No  . Sexual activity: Yes    Partners: Male    Birth control/ protection: Surgical     Comment: hyst   Other Topics Concern  . Not on file   Social History Narrative   Exercise---walk, zumba, Conservation officer, nature video    Past Surgical History:  Procedure Laterality Date  . BREAST LUMPECTOMY  01/06/11  . CYSTOSCOPY  05/19/2012   Procedure: CYSTOSCOPY;  Surgeon: Alwyn Pea, MD;  Location: [facility];  Service: Gynecology;  Laterality: N/A;  . MYOMECTOMY  2004     2010 robotic  . OVARIAN CYST REMOVAL  2010   x2  . ROBOTIC ASSISTED  LAP VAGINAL HYSTERECTOMY    . ROBOTIC ASSISTED LAPAROSCOPIC OVARIAN CYSTECTOMY Right 03/20/2015   Procedure: ROBOTIC ASSISTED LAPAROSCOPIC Salpingectomy OVARIAN CYSTECTOMY, pelvic washings, excision of peritoneal pseudocyst.;  Surgeon: Delsa Bern, MD;  Location: [facility];  Service: Gynecology;  Laterality: Right;    Family History  Problem Relation Age of Onset  . Asthma Mother   . Arthritis Mother   . Hypertension Father   . Heart disease Maternal Grandmother     PCI  . Cancer Maternal Grandmother 79    breast  . Cancer Other 60    breast and possible colon  . Diabetes Brother   . Migraines Sister   . Heart attack Paternal Grandfather     before age 21    No Known Allergies  Current Outpatient Prescriptions on File Prior to Visit  Medication Sig Dispense Refill  . albuterol (PROVENTIL HFA;VENTOLIN HFA) 108 (90 Base) MCG/ACT inhaler Inhale 2 puffs into the lungs every 6 (six) hours as needed for wheezing. 1 Inhaler 2  . ALPRAZolam (XANAX) 0.25 MG tablet TAKE ONE TABLET BY MOUTH THREE TIMES DAILY AS NEEDED FOR ANXIETY OR SLEEP 30 tablet 0  . Ascorbic  Acid (VITAMIN C) 1000 MG tablet Take 1,000 mg by mouth daily.    Marland Kitchen aspirin 325 MG tablet Take 1 tablet (325 mg total) by mouth daily.    . cetirizine (ZYRTEC) 10 MG tablet Take 10 mg by mouth daily.    . cholecalciferol (VITAMIN D) 1000 UNITS tablet Take 1,000 Units by mouth daily.    Marland Kitchen ibuprofen (ADVIL,MOTRIN) 600 MG tablet Take 600 mg by mouth every 6 (six) hours as needed for headache or mild pain.     Marland Kitchen lisinopril (PRINIVIL,ZESTRIL) 10 MG tablet Take 1 tablet (10 mg total) by mouth daily. 90 tablet 3  . metoprolol (LOPRESSOR) 100 MG tablet Take 1 tablet (100 mg total) by mouth 2 (two) times daily. 180 tablet 3  . Multiple Vitamins-Calcium (ONE-A-DAY WOMENS PO) Take 1 tablet by mouth daily.    Marland Kitchen omeprazole (PRILOSEC) 40 MG capsule Take 1 capsule (40 mg total) by mouth daily. 30 capsule 3  . spironolactone (ALDACTONE) 25 MG tablet  Take 1 tablet (25 mg total) by mouth daily. 90 tablet 3  . valACYclovir (VALTREX) 500 MG tablet Take 500 mg by mouth daily.      No current facility-administered medications on file prior to visit.     BP 118/70 (BP Location: Right Arm, Cuff Size: Large)   Pulse 72   Temp 98.1 F (36.7 C) (Oral)   Resp 18   Ht 5\' 3"  (1.6 m)   Wt 208 lb 6.4 oz (94.5 kg)   LMP 04/18/2012   SpO2 100% Comment: room air  BMI 36.92 kg/m       Objective:   Physical Exam  Constitutional: She appears well-developed and well-nourished.  HENT:  Head: Normocephalic and atraumatic.  Right Ear: Tympanic membrane and ear canal normal.  Mouth/Throat: No oropharyngeal exudate, posterior oropharyngeal edema or posterior oropharyngeal erythema.  L TM occluded by cerumen  Eyes: Left eye exhibits no discharge. No scleral icterus.  Cardiovascular: Normal rate, regular rhythm and normal heart sounds.   No murmur heard. Pulmonary/Chest: Effort normal and breath sounds normal. She has no wheezes.  Lymphadenopathy:    She has no cervical adenopathy.  Psychiatric: She has a normal mood and affect. Her behavior is normal. Judgment and thought content normal.          Assessment & Plan:  Asthma exacerbation- no overt wheezing on exam but she has a dry spastic cough and reports + wheezing at home and tightness in her breathing. Will rx with a pred taper and Q6hr albuterol for the next few days.  Obtain CXR to rule out PNA.  For the nasal drainage- continue zyrtec, add flonase.  Pt is advised to follow up if new/worsening symptoms or if symptoms are not improved in 3 days.

## 2016-11-18 ENCOUNTER — Telehealth: Payer: Self-pay | Admitting: Family Medicine

## 2016-11-18 NOTE — Telephone Encounter (Signed)
Patient called complaining of left shoulder pain. When asked if it felt like muscle pain or shooting pain she stated it was shooting pain and making her unable to mover her head or neck. Sent to [facility]

## 2016-11-25 ENCOUNTER — Telehealth: Payer: Self-pay

## 2016-11-25 NOTE — Telephone Encounter (Signed)
noted 

## 2016-11-25 NOTE — Telephone Encounter (Signed)
Appointment Request From: [name]. [name]    With Provider: Ann Held, [place] at [facility] [place]]    Preferred Date Range: From 11/25/2016 To 11/28/2016    Preferred Times: Any    Reason for visit: Office Visit    Comments:  I am currently experiencing swelling on the right side of my body. I am suffering from severe stiffness in my right hand I believe it's trigger finger which was treated for last year by Dr. Amedeo Plenty. It is so severe I can't do simple everyday activities like brush my teeth, write, open objects, close my hand or make a fist. It is very painful. As for the swelling I am noticing bruises that I have not idea how they occurred.     Your immediate attention for treatment would be appreciated.     Sincerely,    [name]

## 2016-11-25 NOTE — Telephone Encounter (Signed)
The following note was received in the MyChart appt pool. Called to follow up with patient.  Pt reached on work number. C/o:  Right knee and right  lower leg swelling with intermittent numbness x 1 month.  Denied pain, tenderness, increased warmth. Pt unable to quantify the degree of swelling, but states she does notice that right leg is larger than left leg and there is a rash noted on right knee.  No chest pain or shortness of breath.  No additional swelling noted elsewhere.  Pt states she has been taking spironolactone daily as advised.  She's on estrogen patch, so there are some concerns about a blood clot.    Pt also c/o: right hand pain/stiffness.  States she's able to move her mouse around at work, but does have difficulty with gripping.  Performing simple tasks such was writing, brushing teeth, pick up objects are difficult.  Pt states she called Dr. Vanetta Shawl office and has a follow up appt with them on 12/08/16.    No opening available here or other sites today.  Pt was advised to go to UC or ER today.  Pt agreed to go to [facility] off of [address]   Message routed to PCP for FYI.

## 2016-11-26 ENCOUNTER — Telehealth: Payer: Self-pay | Admitting: Family Medicine

## 2016-11-26 NOTE — Telephone Encounter (Signed)
Patient Name: [name]  DOB: 1971-10-18    Initial Comment Caller states c/o swelling and bruising on leg, and finger pain.    Nurse Assessment  Nurse: Raphael Gibney, RN, Vanita Ingles Date/Time Eilene Ghazi Time): 11/26/2016 2:19:12 PM  Confirm and document reason for call. If symptomatic, describe symptoms. ---Caller states she takes diuretics and medication for HTN. No injury. Has swelling on her right leg and knee. Has bruising on her right ankle. Has numbness and tingling when she sits for a period of time. Having difficulty writing with her right hand. Can not make a fist.  Does the patient have any new or worsening symptoms? ---Yes  Will a triage be completed? ---Yes  Related visit to physician within the last 2 weeks? ---No  Does the PT have any chronic conditions? (i.e. diabetes, asthma, etc.) ---Yes  List chronic conditions. ---asthma, HTN  Is the patient pregnant or possibly pregnant? (Ask all females between the ages of 22-55) ---No  Is this a behavioral health or substance abuse call? ---No     Guidelines    Guideline Title Affirmed Question Affirmed Notes  Leg Swelling and Edema [1] Thigh, calf, or ankle swelling AND [2] only 1 side    Final Disposition User   See Physician within 4 Hours (or PCP triage) Raphael Gibney, RN, [place]    Comments  No appts available at [place] within 4 hrs. Pt does not want to go to another office or urgent care Please call pt back regarding appt.   Referrals  GO TO FACILITY REFUSED   Disagree/Comply: Disagree  Disagree/Comply Reason: Disagree with instructions

## 2016-11-28 ENCOUNTER — Ambulatory Visit (INDEPENDENT_AMBULATORY_CARE_PROVIDER_SITE_OTHER): Payer: Managed Care, Other (non HMO) | Admitting: Family Medicine

## 2016-11-28 VITALS — BP 118/70 | HR 72 | Temp 98.1°F | Wt 208.0 lb

## 2016-11-28 DIAGNOSIS — M5440 Lumbago with sciatica, unspecified side: Secondary | ICD-10-CM | POA: Diagnosis not present

## 2016-11-28 DIAGNOSIS — M25561 Pain in right knee: Secondary | ICD-10-CM

## 2016-11-28 DIAGNOSIS — M545 Low back pain, unspecified: Secondary | ICD-10-CM

## 2016-11-28 MED ORDER — ALPRAZOLAM 0.25 MG PO TABS: ORAL_TABLET | ORAL | 0 refills | Status: DC

## 2016-11-28 NOTE — Progress Notes (Signed)
Subjective:    Patient ID: [name], female    DOB: [birth year], 46 y.o.   MRN: [id number]  Chief Complaint  Patient presents with  . Joint Swelling    Right knee pain, with swelling down the same leg. On going for a few months,     HPI Patient is in today for right knee and leg pain with edema.  She also c/o b/l leg pain with low back pain Knee pain x several weeks-- she got a brace from the pharmacy and has been wearing that.  No known injury.  She is also c/o con't back pain -- pt has been seen for this before and pain just con't to worsen.  She had pt in past.    Past Medical History:  Diagnosis Date  . Allergy   . Asthma   . GERD (gastroesophageal reflux disease)    no meds  . H/O: myomectomy   . Hearing impaired    can hear on left side only  . Heart murmur   . Herpes genitalia   . Hypertension    pulmonary  . Refusal of blood transfusions as patient is Jehovah's Witness   . TIA (transient ischemic attack)     Past Surgical History:  Procedure Laterality Date  . BREAST LUMPECTOMY  01/06/11  . CYSTOSCOPY  05/19/2012   Procedure: CYSTOSCOPY;  Surgeon: Alwyn Pea, MD;  Location: [facility];  Service: Gynecology;  Laterality: N/A;  . MYOMECTOMY  2004     2010 robotic  . OVARIAN CYST REMOVAL  2010   x2  . ROBOTIC ASSISTED LAP VAGINAL HYSTERECTOMY    . ROBOTIC ASSISTED LAPAROSCOPIC OVARIAN CYSTECTOMY Right 03/20/2015   Procedure: ROBOTIC ASSISTED LAPAROSCOPIC Salpingectomy OVARIAN CYSTECTOMY, pelvic washings, excision of peritoneal pseudocyst.;  Surgeon: Delsa Bern, MD;  Location: [facility];  Service: Gynecology;  Laterality: Right;    Family History  Problem Relation Age of Onset  . Asthma Mother   . Arthritis Mother   . Hypertension Father   . Heart disease Maternal Grandmother     PCI  . Cancer Maternal Grandmother 39    breast  . Cancer Other 60    breast and possible colon  . Diabetes Brother   . Migraines Sister   . Heart attack Paternal  Grandfather     before age 64    Social History   Social History  . Marital status: Single    Spouse name: N/A  . Number of children: N/A  . Years of education: N/A   Occupational History  . Not on file.   Social History Main Topics  . Smoking status: Never Smoker  . Smokeless tobacco: Never Used  . Alcohol use No  . Drug use: No  . Sexual activity: Yes    Partners: Male    Birth control/ protection: Surgical     Comment: hyst   Other Topics Concern  . Not on file   Social History Narrative   Exercise---walk, zumba, american ballet video    Outpatient Medications Prior to Visit  Medication Sig Dispense Refill  . albuterol (PROVENTIL HFA;VENTOLIN HFA) 108 (90 Base) MCG/ACT inhaler Inhale 2 puffs into the lungs every 6 (six) hours as needed for wheezing. 1 Inhaler 2  . Ascorbic Acid (VITAMIN C) 1000 MG tablet Take 1,000 mg by mouth daily.    Marland Kitchen aspirin 325 MG tablet Take 1 tablet (325 mg total) by mouth daily.    . cetirizine (ZYRTEC) 10 MG tablet Take  10 mg by mouth daily.    . cholecalciferol (VITAMIN D) 1000 UNITS tablet Take 1,000 Units by mouth daily.    Marland Kitchen estradiol (VIVELLE-DOT) 0.0375 MG/24HR Place 1 patch onto the skin 2 (two) times a week.    . fluticasone (FLONASE) 50 MCG/ACT nasal spray Place 2 sprays into both nostrils daily.  2  . ibuprofen (ADVIL,MOTRIN) 600 MG tablet Take 600 mg by mouth every 6 (six) hours as needed for headache or mild pain.     Marland Kitchen lisinopril (PRINIVIL,ZESTRIL) 10 MG tablet Take 1 tablet (10 mg total) by mouth daily. 90 tablet 3  . metoprolol (LOPRESSOR) 100 MG tablet Take 1 tablet (100 mg total) by mouth 2 (two) times daily. 180 tablet 3  . Multiple Vitamins-Calcium (ONE-A-DAY WOMENS PO) Take 1 tablet by mouth daily.    Marland Kitchen omeprazole (PRILOSEC) 40 MG capsule Take 1 capsule (40 mg total) by mouth daily. 30 capsule 3  . spironolactone (ALDACTONE) 25 MG tablet Take 1 tablet (25 mg total) by mouth daily. 90 tablet 3  . valACYclovir (VALTREX)  500 MG tablet Take 500 mg by mouth daily.     Marland Kitchen ALPRAZolam (XANAX) 0.25 MG tablet TAKE ONE TABLET BY MOUTH THREE TIMES DAILY AS NEEDED FOR ANXIETY OR SLEEP 30 tablet 0  . predniSONE (DELTASONE) 10 MG tablet 4 tabs by mouth once daily for 2 days, then 3 tabs daily x 2 days, then 2 tabs daily x 2 days, then 1 tab daily x 2 days 20 tablet 0   No facility-administered medications prior to visit.     No Known Allergies  Review of Systems  Constitutional: Negative for fever and malaise/fatigue.  HENT: Negative for congestion.   Eyes: Negative for blurred vision.  Respiratory: Negative for cough and shortness of breath.   Cardiovascular: Positive for leg swelling. Negative for chest pain and palpitations.  Gastrointestinal: Negative for vomiting.  Musculoskeletal: Positive for back pain and joint pain.  Skin: Negative for rash.  Neurological: Positive for tingling. Negative for loss of consciousness and headaches.       Objective:    Physical Exam  Constitutional: She is oriented to person, place, and time. She appears well-developed and well-nourished. No distress.  HENT:  Head: Normocephalic and atraumatic.  Eyes: Conjunctivae are normal.  Neck: Normal range of motion. No thyromegaly present.  Cardiovascular: Normal rate and regular rhythm.   Pulmonary/Chest: Effort normal and breath sounds normal. She has no wheezes.  Abdominal: Soft. Bowel sounds are normal. There is no tenderness.  Musculoskeletal: Normal range of motion. She exhibits tenderness. She exhibits no edema or deformity.       Lumbar back: She exhibits tenderness, pain and spasm.  Neurological: She is alert and oriented to person, place, and time.  Numbness and tingling both low ext Good strength dtr b/l and equal  Skin: Skin is warm and dry. She is not diaphoretic.  Psychiatric: She has a normal mood and affect.  Nursing note and vitals reviewed.   LMP 04/18/2012  Wt Readings from Last 3 Encounters:  10/21/16  208 lb 6.4 oz (94.5 kg)  10/08/16 207 lb 6.4 oz (94.1 kg)  07/14/16 206 lb 6.4 oz (93.6 kg)     Lab Results  Component Value Date   WBC 9.3 01/04/2016   HGB 12.4 01/04/2016   HCT 38.2 01/04/2016   PLT 323 01/04/2016   GLUCOSE 107 (H) 01/04/2016   CHOL 168 03/06/2015   TRIG 79.0 03/06/2015   HDL 60.10 03/06/2015  LDLCALC 92 03/06/2015   ALT 21 01/04/2016   AST 21 01/04/2016   NA 139 01/04/2016   K 4.0 01/04/2016   CL 102 01/04/2016   CREATININE 0.90 01/04/2016   BUN 12 01/04/2016   CO2 28 01/04/2016   TSH 0.56 03/06/2015   INR 1.11 11/09/2012   HGBA1C 5.1 11/10/2012    Lab Results  Component Value Date   TSH 0.56 03/06/2015   Lab Results  Component Value Date   WBC 9.3 01/04/2016   HGB 12.4 01/04/2016   HCT 38.2 01/04/2016   MCV 91.4 01/04/2016   PLT 323 01/04/2016   Lab Results  Component Value Date   NA 139 01/04/2016   K 4.0 01/04/2016   CO2 28 01/04/2016   GLUCOSE 107 (H) 01/04/2016   BUN 12 01/04/2016   CREATININE 0.90 01/04/2016   BILITOT 0.5 01/04/2016   ALKPHOS 62 01/04/2016   AST 21 01/04/2016   ALT 21 01/04/2016   PROT 7.1 01/04/2016   ALBUMIN 3.8 01/04/2016   CALCIUM 9.4 01/04/2016   ANIONGAP 9 01/04/2016   GFR 99.07 03/06/2015   Lab Results  Component Value Date   CHOL 168 03/06/2015   Lab Results  Component Value Date   HDL 60.10 03/06/2015   Lab Results  Component Value Date   LDLCALC 92 03/06/2015   Lab Results  Component Value Date   TRIG 79.0 03/06/2015   Lab Results  Component Value Date   CHOLHDL 3 03/06/2015   Lab Results  Component Value Date   HGBA1C 5.1 11/10/2012       Assessment & Plan:   Problem List Items Addressed This Visit      Unprioritized   Knee pain, right - Primary    con't with brace Ice, rest      Relevant Orders   DG Knee Complete 4 Views Right   Ambulatory referral to Orthopedic Surgery   Low back pain with radiation    Ongoing for few years Pt has had physical therapy and xrays  and pain never improved  Pain worsened with no known injury      Relevant Orders   MR Lumbar Spine Wo Contrast      I have discontinued Ms. Molzahn's predniSONE. I am also having her maintain her Multiple Vitamins-Calcium (ONE-A-DAY WOMENS PO), aspirin, valACYclovir, vitamin C, cetirizine, cholecalciferol, ibuprofen, lisinopril, omeprazole, metoprolol, spironolactone, estradiol, fluticasone, albuterol, and ALPRAZolam.  Meds ordered this encounter  Medications  . ALPRAZolam (XANAX) 0.25 MG tablet    Sig: TAKE ONE TABLET BY MOUTH THREE TIMES DAILY AS NEEDED FOR ANXIETY OR SLEEP    Dispense:  30 tablet    Refill:  0  pt here >30 min with >50% face to face discussing her knee and back pain  CMA served as scribe during this visit. History, Physical and Plan performed by medical provider. Documentation and orders reviewed and attested to.   Ann Held, DO

## 2016-11-28 NOTE — Progress Notes (Signed)
Pre visit review using our clinic review tool, if applicable. No additional management support is needed unless otherwise documented below in the visit note. 

## 2016-11-28 NOTE — Patient Instructions (Signed)

## 2016-11-29 DIAGNOSIS — M5440 Lumbago with sciatica, unspecified side: Secondary | ICD-10-CM

## 2016-11-29 DIAGNOSIS — M545 Low back pain, unspecified: Secondary | ICD-10-CM | POA: Insufficient documentation

## 2016-11-29 NOTE — Assessment & Plan Note (Addendum)
Ongoing for few years Pt has had physical therapy and xrays and pain never improved  Pain worsened with no known injury

## 2016-11-29 NOTE — Assessment & Plan Note (Signed)
con't with brace Ice, rest

## 2016-12-12 ENCOUNTER — Encounter: Payer: Self-pay | Admitting: Family Medicine

## 2016-12-16 ENCOUNTER — Ambulatory Visit (INDEPENDENT_AMBULATORY_CARE_PROVIDER_SITE_OTHER): Payer: Managed Care, Other (non HMO) | Admitting: Family Medicine

## 2016-12-16 ENCOUNTER — Encounter: Payer: Self-pay | Admitting: Family Medicine

## 2016-12-16 DIAGNOSIS — M6530 Trigger finger, unspecified finger: Secondary | ICD-10-CM

## 2016-12-16 DIAGNOSIS — M25561 Pain in right knee: Secondary | ICD-10-CM

## 2016-12-16 NOTE — Assessment & Plan Note (Addendum)
Per ortho con't IB prn Pt to f/u with ortho or let us know is she want to do to Dr Barbaraann Barthel since that is more convenient then driving into [place] for an injection

## 2016-12-16 NOTE — Assessment & Plan Note (Signed)
Pt to see ortho friday

## 2016-12-16 NOTE — Patient Instructions (Signed)

## 2016-12-16 NOTE — Progress Notes (Signed)
Pre visit review using our clinic review tool, if applicable. No additional management support is needed unless otherwise documented below in the visit note. 

## 2016-12-16 NOTE — Progress Notes (Signed)
Subjective:    Patient ID: [name], female    DOB: November 24, 1971, 46 y.o.   MRN: [id number]  Chief Complaint  Patient presents with  . Follow-up    pt report she is still having pain in the right knee.    HPI Patient is in today for follow up appointment, pt report she is still having pain in the right knee.-- saw ortho today and has appointment with hand specialist for her trigger finger.  No other complaints.    Past Medical History:  Diagnosis Date  . Allergy   . Asthma   . GERD (gastroesophageal reflux disease)    no meds  . H/O: myomectomy   . Hearing impaired    can hear on left side only  . Heart murmur   . Herpes genitalia   . Hypertension    pulmonary  . Refusal of blood transfusions as patient is Jehovah's Witness   . TIA (transient ischemic attack)     Past Surgical History:  Procedure Laterality Date  . BREAST LUMPECTOMY  01/06/11  . CYSTOSCOPY  05/19/2012   Procedure: CYSTOSCOPY;  Surgeon: Alwyn Pea, MD;  Location: [facility];  Service: Gynecology;  Laterality: N/A;  . MYOMECTOMY  2004     2010 robotic  . OVARIAN CYST REMOVAL  2010   x2  . ROBOTIC ASSISTED LAP VAGINAL HYSTERECTOMY    . ROBOTIC ASSISTED LAPAROSCOPIC OVARIAN CYSTECTOMY Right 03/20/2015   Procedure: ROBOTIC ASSISTED LAPAROSCOPIC Salpingectomy OVARIAN CYSTECTOMY, pelvic washings, excision of peritoneal pseudocyst.;  Surgeon: Delsa Bern, MD;  Location: [facility];  Service: Gynecology;  Laterality: Right;    Family History  Problem Relation Age of Onset  . Asthma Mother   . Arthritis Mother   . Hypertension Father   . Heart disease Maternal Grandmother     PCI  . Cancer Maternal Grandmother 75    breast  . Cancer Other 60    breast and possible colon  . Diabetes Brother   . Migraines Sister   . Heart attack Paternal Grandfather     before age 30    Social History   Social History  . Marital status: Single    Spouse name: N/A  . Number of children: N/A  . Years of  education: N/A   Occupational History  . Not on file.   Social History Main Topics  . Smoking status: Never Smoker  . Smokeless tobacco: Never Used  . Alcohol use No  . Drug use: No  . Sexual activity: Yes    Partners: Male    Birth control/ protection: Surgical     Comment: hyst   Other Topics Concern  . Not on file   Social History Narrative   Exercise---walk, zumba, american ballet video    Outpatient Medications Prior to Visit  Medication Sig Dispense Refill  . albuterol (PROVENTIL HFA;VENTOLIN HFA) 108 (90 Base) MCG/ACT inhaler Inhale 2 puffs into the lungs every 6 (six) hours as needed for wheezing. 1 Inhaler 2  . ALPRAZolam (XANAX) 0.25 MG tablet TAKE ONE TABLET BY MOUTH THREE TIMES DAILY AS NEEDED FOR ANXIETY OR SLEEP 30 tablet 0  . Ascorbic Acid (VITAMIN C) 1000 MG tablet Take 1,000 mg by mouth daily.    Marland Kitchen aspirin 325 MG tablet Take 1 tablet (325 mg total) by mouth daily.    . cetirizine (ZYRTEC) 10 MG tablet Take 10 mg by mouth daily.    . cholecalciferol (VITAMIN D) 1000 UNITS tablet Take 1,000 Units  by mouth daily.    Marland Kitchen estradiol (VIVELLE-DOT) 0.0375 MG/24HR Place 1 patch onto the skin 2 (two) times a week.    . fluticasone (FLONASE) 50 MCG/ACT nasal spray Place 2 sprays into both nostrils daily.  2  . ibuprofen (ADVIL,MOTRIN) 600 MG tablet Take 600 mg by mouth every 6 (six) hours as needed for headache or mild pain.     Marland Kitchen lisinopril (PRINIVIL,ZESTRIL) 10 MG tablet Take 1 tablet (10 mg total) by mouth daily. 90 tablet 3  . metoprolol (LOPRESSOR) 100 MG tablet Take 1 tablet (100 mg total) by mouth 2 (two) times daily. 180 tablet 3  . Multiple Vitamins-Calcium (ONE-A-DAY WOMENS PO) Take 1 tablet by mouth daily.    Marland Kitchen omeprazole (PRILOSEC) 40 MG capsule Take 1 capsule (40 mg total) by mouth daily. 30 capsule 3  . spironolactone (ALDACTONE) 25 MG tablet Take 1 tablet (25 mg total) by mouth daily. 90 tablet 3  . valACYclovir (VALTREX) 500 MG tablet Take 500 mg by mouth  daily.      No facility-administered medications prior to visit.     No Known Allergies  Review of Systems  Constitutional: Negative for fever.  HENT: Negative for congestion.   Eyes: Negative for blurred vision.  Respiratory: Negative for cough.   Cardiovascular: Negative for chest pain and palpitations.  Gastrointestinal: Negative for vomiting.  Musculoskeletal: Positive for joint pain.  Skin: Negative for rash.  Neurological: Negative for loss of consciousness and headaches.       Objective:    Physical Exam  Constitutional: She is oriented to person, place, and time. She appears well-developed and well-nourished.  HENT:  Head: Normocephalic and atraumatic.  Eyes: Conjunctivae and EOM are normal.  Neck: Normal range of motion. Neck supple. No JVD present. Carotid bruit is not present. No thyromegaly present.  Cardiovascular: Normal rate, regular rhythm and normal heart sounds.   No murmur heard. Pulmonary/Chest: Effort normal and breath sounds normal. No respiratory distress. She has no wheezes. She has no rales. She exhibits no tenderness.  Musculoskeletal: She exhibits tenderness. She exhibits no edema.  Neurological: She is alert and oriented to person, place, and time.  Psychiatric: She has a normal mood and affect.  Nursing note and vitals reviewed.   BP (!) 82/58 (BP Location: Left Arm, Patient Position: Sitting, Cuff Size: Large)   Pulse 90   Temp 97.7 F (36.5 C) (Oral)   Resp 16   Ht 5\' 3"  (1.6 m)   Wt 210 lb 6.4 oz (95.4 kg)   LMP 04/18/2012   SpO2 97%   BMI 37.27 kg/m  Wt Readings from Last 3 Encounters:  12/16/16 210 lb 6.4 oz (95.4 kg)  11/28/16 208 lb (94.3 kg)  10/21/16 208 lb 6.4 oz (94.5 kg)     Lab Results  Component Value Date   WBC 9.3 01/04/2016   HGB 12.4 01/04/2016   HCT 38.2 01/04/2016   PLT 323 01/04/2016   GLUCOSE 107 (H) 01/04/2016   CHOL 168 03/06/2015   TRIG 79.0 03/06/2015   HDL 60.10 03/06/2015   LDLCALC 92 03/06/2015     ALT 21 01/04/2016   AST 21 01/04/2016   NA 139 01/04/2016   K 4.0 01/04/2016   CL 102 01/04/2016   CREATININE 0.90 01/04/2016   BUN 12 01/04/2016   CO2 28 01/04/2016   TSH 0.56 03/06/2015   INR 1.11 11/09/2012   HGBA1C 5.1 11/10/2012    Lab Results  Component Value Date   TSH  0.56 03/06/2015   Lab Results  Component Value Date   WBC 9.3 01/04/2016   HGB 12.4 01/04/2016   HCT 38.2 01/04/2016   MCV 91.4 01/04/2016   PLT 323 01/04/2016   Lab Results  Component Value Date   NA 139 01/04/2016   K 4.0 01/04/2016   CO2 28 01/04/2016   GLUCOSE 107 (H) 01/04/2016   BUN 12 01/04/2016   CREATININE 0.90 01/04/2016   BILITOT 0.5 01/04/2016   ALKPHOS 62 01/04/2016   AST 21 01/04/2016   ALT 21 01/04/2016   PROT 7.1 01/04/2016   ALBUMIN 3.8 01/04/2016   CALCIUM 9.4 01/04/2016   ANIONGAP 9 01/04/2016   GFR 99.07 03/06/2015   Lab Results  Component Value Date   CHOL 168 03/06/2015   Lab Results  Component Value Date   HDL 60.10 03/06/2015   Lab Results  Component Value Date   LDLCALC 92 03/06/2015   Lab Results  Component Value Date   TRIG 79.0 03/06/2015   Lab Results  Component Value Date   CHOLHDL 3 03/06/2015   Lab Results  Component Value Date   HGBA1C 5.1 11/10/2012       Assessment & Plan:   Problem List Items Addressed This Visit      Unprioritized   Trigger finger of right hand    Pt to see ortho friday      Knee pain, right    Per ortho con't IB prn Pt to f/u with ortho or let us know is she want to do to Dr Barbaraann Barthel since that is more convenient then driving into [place] for an injection         I am having Ms. Cervenka maintain her Multiple Vitamins-Calcium (ONE-A-DAY WOMENS PO), aspirin, valACYclovir, vitamin C, cetirizine, cholecalciferol, ibuprofen, lisinopril, omeprazole, metoprolol, spironolactone, estradiol, fluticasone, albuterol, and ALPRAZolam.  No orders of the defined types were placed in this encounter.   CMA served  as Education administrator during this visit. History, Physical and Plan performed by medical provider. Documentation and orders reviewed and attested to.   Ann Held, DO

## 2017-01-14 ENCOUNTER — Telehealth: Payer: Self-pay | Admitting: Family Medicine

## 2017-01-14 NOTE — Telephone Encounter (Signed)
Relation to [id number] Call back number: [phone]    Reason for call:  Patient states she received explanation of benefits stating urine drug screen will not be cover therefore she owes $925 patient did find out [facility] is network and In the future would like to use in network lab. Patient would like to discuss what can be done regarding bill, please advise

## 2017-01-29 ENCOUNTER — Encounter: Payer: Self-pay | Admitting: Family Medicine

## 2017-02-17 ENCOUNTER — Other Ambulatory Visit: Payer: Self-pay | Admitting: Family Medicine

## 2017-02-17 NOTE — Telephone Encounter (Signed)
Requesting:  alprazolam Contract   Signed on 11/28/2016 UDS    none Last OV    12/16/2016---next one is scheduled for 06/15/2017 Last Refill   #30 no refills on 11/28/16  Please Advise

## 2017-02-17 NOTE — Telephone Encounter (Signed)
Faxed hardcopy for alprazolam to [facility] on [address].

## 2017-02-19 ENCOUNTER — Other Ambulatory Visit: Payer: Self-pay | Admitting: Family Medicine

## 2017-02-19 DIAGNOSIS — I10 Essential (primary) hypertension: Secondary | ICD-10-CM

## 2017-03-20 ENCOUNTER — Other Ambulatory Visit: Payer: Self-pay | Admitting: Family Medicine

## 2017-03-20 DIAGNOSIS — I10 Essential (primary) hypertension: Secondary | ICD-10-CM

## 2017-03-21 ENCOUNTER — Encounter: Payer: Self-pay | Admitting: Family Medicine

## 2017-03-23 ENCOUNTER — Other Ambulatory Visit: Payer: Self-pay | Admitting: Family Medicine

## 2017-03-23 DIAGNOSIS — M549 Dorsalgia, unspecified: Secondary | ICD-10-CM

## 2017-03-23 NOTE — Telephone Encounter (Signed)
Lets refer her to ortho-- they may be able to get mri easier

## 2017-04-01 ENCOUNTER — Other Ambulatory Visit: Payer: Self-pay | Admitting: Family Medicine

## 2017-04-01 DIAGNOSIS — I10 Essential (primary) hypertension: Secondary | ICD-10-CM

## 2017-04-10 ENCOUNTER — Other Ambulatory Visit: Payer: Self-pay | Admitting: Family Medicine

## 2017-04-10 NOTE — Telephone Encounter (Signed)
Requesting:   alprazolam Contract    11/28/2016 UDS    none Last OV    12/16/2016 Last Refill    #30 no refills on 02/17/2017  Please Advise

## 2017-04-10 NOTE — Telephone Encounter (Signed)
Faxed hardcopy for Alprazolam to [facility]

## 2017-05-31 ENCOUNTER — Encounter: Payer: Self-pay | Admitting: Family Medicine

## 2017-06-08 ENCOUNTER — Other Ambulatory Visit: Payer: Self-pay | Admitting: Family Medicine

## 2017-06-09 NOTE — Telephone Encounter (Signed)
Requesting:   alprazolam Contract    11/28/16 UDS    none Last OV    12/16/2016-----future appt is on 06/18/2017 Last Refill      #30 no refills on 04/10/17  Please Advise

## 2017-06-09 NOTE — Telephone Encounter (Signed)
Faxed hardcopy for alprazolam to [place]

## 2017-06-15 ENCOUNTER — Ambulatory Visit: Payer: Managed Care, Other (non HMO) | Admitting: Family Medicine

## 2017-06-18 ENCOUNTER — Encounter: Payer: Self-pay | Admitting: Family Medicine

## 2017-06-18 ENCOUNTER — Ambulatory Visit (INDEPENDENT_AMBULATORY_CARE_PROVIDER_SITE_OTHER): Payer: 59 | Admitting: Family Medicine

## 2017-06-18 VITALS — BP 124/60 | HR 78 | Temp 98.3°F | Resp 16 | Ht 63.0 in | Wt 200.6 lb

## 2017-06-18 DIAGNOSIS — F419 Anxiety disorder, unspecified: Secondary | ICD-10-CM | POA: Diagnosis not present

## 2017-06-18 DIAGNOSIS — I10 Essential (primary) hypertension: Secondary | ICD-10-CM | POA: Diagnosis not present

## 2017-06-18 MED ORDER — SPIRONOLACTONE 25 MG PO TABS: 25.0000 mg | ORAL_TABLET | Freq: Every day | ORAL | 3 refills | Status: DC

## 2017-06-18 MED ORDER — ALPRAZOLAM 0.25 MG PO TABS: ORAL_TABLET | ORAL | 0 refills | Status: DC

## 2017-06-18 MED ORDER — LISINOPRIL 10 MG PO TABS: 10.0000 mg | ORAL_TABLET | Freq: Every day | ORAL | 3 refills | Status: DC

## 2017-06-18 MED ORDER — METOPROLOL TARTRATE 100 MG PO TABS: 100.0000 mg | ORAL_TABLET | Freq: Two times a day (BID) | ORAL | 3 refills | Status: DC

## 2017-06-18 NOTE — Progress Notes (Signed)
Patient ID: [name], female   DOB: [birth year], 46 y.o.   MRN: [id number]     Subjective:  I acted as a Education administrator for Dr. Carollee Herter.  [name], [place]   Patient ID: [name], female    DOB: 18-Jun-1971, 46 y.o.   MRN: [id number]  Chief Complaint  Patient presents with  . Hypertension    HPI  Patient is in today for follow up blood pressure.  She states that she has been doing well on current medication.  She had trigger finger surgery this morning.  Patient Care Team: Carollee Herter, Alferd Apa, DO as PCP - General Delsa Bern, MD as Consulting Physician (Obstetrics and Gynecology)   Past Medical History:  Diagnosis Date  . Allergy   . Asthma   . GERD (gastroesophageal reflux disease)    no meds  . H/O: myomectomy   . Hearing impaired    can hear on left side only  . Heart murmur   . Herpes genitalia   . Hypertension    pulmonary  . Refusal of blood transfusions as patient is Jehovah's Witness   . TIA (transient ischemic attack)     Past Surgical History:  Procedure Laterality Date  . BREAST LUMPECTOMY  01/06/11  . CYSTOSCOPY  05/19/2012   Procedure: CYSTOSCOPY;  Surgeon: Alwyn Pea, MD;  Location: [facility];  Service: Gynecology;  Laterality: N/A;  . MYOMECTOMY  2004     2010 robotic  . OVARIAN CYST REMOVAL  2010   x2  . ROBOTIC ASSISTED LAP VAGINAL HYSTERECTOMY    . ROBOTIC ASSISTED LAPAROSCOPIC OVARIAN CYSTECTOMY Right 03/20/2015   Procedure: ROBOTIC ASSISTED LAPAROSCOPIC Salpingectomy OVARIAN CYSTECTOMY, pelvic washings, excision of peritoneal pseudocyst.;  Surgeon: Delsa Bern, MD;  Location: [facility];  Service: Gynecology;  Laterality: Right;    Family History  Problem Relation Age of Onset  . Asthma Mother   . Arthritis Mother   . Hypertension Father   . Heart disease Maternal Grandmother        PCI  . Cancer Maternal Grandmother 19       breast  . Cancer Other 60       breast and possible colon  . Diabetes Brother   . Migraines Sister   .  Heart attack Paternal Grandfather        before age 74    Social History   Social History  . Marital status: Single    Spouse name: N/A  . Number of children: N/A  . Years of education: N/A   Occupational History  . Not on file.   Social History Main Topics  . Smoking status: Never Smoker  . Smokeless tobacco: Never Used  . Alcohol use No  . Drug use: No  . Sexual activity: Yes    Partners: Male    Birth control/ protection: Surgical     Comment: hyst   Other Topics Concern  . Not on file   Social History Narrative   Exercise---walk, zumba, american ballet video    Outpatient Medications Prior to Visit  Medication Sig Dispense Refill  . albuterol (PROVENTIL HFA;VENTOLIN HFA) 108 (90 Base) MCG/ACT inhaler Inhale 2 puffs into the lungs every 6 (six) hours as needed for wheezing. 1 Inhaler 2  . Ascorbic Acid (VITAMIN C) 1000 MG tablet Take 1,000 mg by mouth daily.    Marland Kitchen aspirin 325 MG tablet Take 1 tablet (325 mg total) by mouth daily.    . cetirizine (ZYRTEC) 10 MG tablet  Take 10 mg by mouth daily.    . cholecalciferol (VITAMIN D) 1000 UNITS tablet Take 1,000 Units by mouth daily.    Marland Kitchen estradiol (VIVELLE-DOT) 0.0375 MG/24HR Place 1 patch onto the skin 2 (two) times a week.    . fluticasone (FLONASE) 50 MCG/ACT nasal spray Place 2 sprays into both nostrils daily.  2  . ibuprofen (ADVIL,MOTRIN) 600 MG tablet Take 600 mg by mouth every 6 (six) hours as needed for headache or mild pain.     . Multiple Vitamins-Calcium (ONE-A-DAY WOMENS PO) Take 1 tablet by mouth daily.    Marland Kitchen omeprazole (PRILOSEC) 40 MG capsule Take 1 capsule (40 mg total) by mouth daily. 30 capsule 3  . valACYclovir (VALTREX) 500 MG tablet Take 500 mg by mouth daily.     Marland Kitchen ALPRAZolam (XANAX) 0.25 MG tablet TAKE 1 TABLET BY MOUTH THREE TIMES DAILY AS NEEDED FOR ANXIETY FOR SLEEP 30 tablet 0  . lisinopril (PRINIVIL,ZESTRIL) 10 MG tablet TAKE ONE TABLET BY MOUTH ONCE DAILY 90 tablet 3  . metoprolol (LOPRESSOR) 100  MG tablet TAKE ONE TABLET BY MOUTH TWICE DAILY 180 tablet 3  . spironolactone (ALDACTONE) 25 MG tablet TAKE ONE TABLET BY MOUTH ONCE DAILY 90 tablet 3   No facility-administered medications prior to visit.     No Known Allergies  Review of Systems  Constitutional: Negative for fever and malaise/fatigue.  HENT: Negative for congestion.   Eyes: Negative for blurred vision.  Respiratory: Negative for cough and shortness of breath.   Cardiovascular: Negative for chest pain, palpitations and leg swelling.  Gastrointestinal: Negative for vomiting.  Musculoskeletal: Negative for back pain.  Skin: Negative for rash.  Neurological: Negative for loss of consciousness and headaches.       Objective:    Physical Exam  Constitutional: She is oriented to person, place, and time. She appears well-developed and well-nourished. No distress.  HENT:  Head: Normocephalic and atraumatic.  Eyes: Conjunctivae are normal.  Neck: Normal range of motion. No thyromegaly present.  Cardiovascular: Normal rate and regular rhythm.   Pulmonary/Chest: Effort normal and breath sounds normal. She has no wheezes.  Abdominal: Soft. Bowel sounds are normal. There is no tenderness.  Musculoskeletal: Normal range of motion. She exhibits tenderness. She exhibits no edema or deformity.       Arms: Neurological: She is alert and oriented to person, place, and time.  Skin: Skin is warm and dry. She is not diaphoretic.  Psychiatric: She has a normal mood and affect. Her behavior is normal. Judgment and thought content normal.  Nursing note and vitals reviewed.   BP 124/60 (BP Location: Left Arm, Cuff Size: Normal)   Pulse 78   Temp 98.3 F (36.8 C) (Oral)   Resp 16   Ht 5\' 3"  (1.6 m)   Wt 200 lb 9.6 oz (91 kg)   LMP 04/18/2012   SpO2 98%   BMI 35.53 kg/m  Wt Readings from Last 3 Encounters:  06/18/17 200 lb 9.6 oz (91 kg)  12/16/16 210 lb 6.4 oz (95.4 kg)  11/28/16 208 lb (94.3 kg)   BP Readings from Last  3 Encounters:  06/18/17 124/60  12/16/16 (!) 82/58  11/28/16 118/70     Immunization History  Administered Date(s) Administered  . Influenza Split 12/25/2012  . Influenza Whole 08/29/2009, 07/30/2011, 08/31/2012  . Influenza,inj,Quad PF,36+ Mos 08/24/2013  . Influenza-Unspecified 08/24/2014, 09/12/2015, 09/25/2016  . Pneumococcal Polysaccharide-23 10/08/2010  . Td 01/05/2008    Health Maintenance  Topic Date Due  .  INFLUENZA VACCINE  10/08/2017 (Originally 06/24/2017)  . MAMMOGRAM  10/08/2017 (Originally 06/12/2016)  . PAP SMEAR  10/08/2017 (Originally 05/09/2016)  . DEXA SCAN  10/08/2017 (Originally 06/06/2016)  . TETANUS/TDAP  01/04/2018  . HIV Screening  Completed    Lab Results  Component Value Date   WBC 9.3 01/04/2016   HGB 12.4 01/04/2016   HCT 38.2 01/04/2016   PLT 323 01/04/2016   GLUCOSE 107 (H) 01/04/2016   CHOL 168 03/06/2015   TRIG 79.0 03/06/2015   HDL 60.10 03/06/2015   LDLCALC 92 03/06/2015   ALT 21 01/04/2016   AST 21 01/04/2016   NA 139 01/04/2016   K 4.0 01/04/2016   CL 102 01/04/2016   CREATININE 0.90 01/04/2016   BUN 12 01/04/2016   CO2 28 01/04/2016   TSH 0.56 03/06/2015   INR 1.11 11/09/2012   HGBA1C 5.1 11/10/2012    Lab Results  Component Value Date   TSH 0.56 03/06/2015   Lab Results  Component Value Date   WBC 9.3 01/04/2016   HGB 12.4 01/04/2016   HCT 38.2 01/04/2016   MCV 91.4 01/04/2016   PLT 323 01/04/2016   Lab Results  Component Value Date   NA 139 01/04/2016   K 4.0 01/04/2016   CO2 28 01/04/2016   GLUCOSE 107 (H) 01/04/2016   BUN 12 01/04/2016   CREATININE 0.90 01/04/2016   BILITOT 0.5 01/04/2016   ALKPHOS 62 01/04/2016   AST 21 01/04/2016   ALT 21 01/04/2016   PROT 7.1 01/04/2016   ALBUMIN 3.8 01/04/2016   CALCIUM 9.4 01/04/2016   ANIONGAP 9 01/04/2016   GFR 99.07 03/06/2015   Lab Results  Component Value Date   CHOL 168 03/06/2015   Lab Results  Component Value Date   HDL 60.10 03/06/2015   Lab  Results  Component Value Date   LDLCALC 92 03/06/2015   Lab Results  Component Value Date   TRIG 79.0 03/06/2015   Lab Results  Component Value Date   CHOLHDL 3 03/06/2015   Lab Results  Component Value Date   HGBA1C 5.1 11/10/2012         Assessment & Plan:   Problem List Items Addressed This Visit      Unprioritized   Essential hypertension (Chronic)   Relevant Medications   lisinopril (PRINIVIL,ZESTRIL) 10 MG tablet   metoprolol tartrate (LOPRESSOR) 100 MG tablet   spironolactone (ALDACTONE) 25 MG tablet   Other Relevant Orders   Comprehensive metabolic panel    Other Visit Diagnoses    Anxiety    -  Primary   Relevant Medications   ALPRAZolam (XANAX) 0.25 MG tablet    pt is in too much pain to get labs today -- she will come back next week  I have changed Ms. Barrantes's lisinopril, metoprolol tartrate, and spironolactone. I am also having her maintain her Multiple Vitamins-Calcium (ONE-A-DAY WOMENS PO), aspirin, valACYclovir, vitamin C, cetirizine, cholecalciferol, ibuprofen, omeprazole, estradiol, fluticasone, albuterol, and ALPRAZolam.  Meds ordered this encounter  Medications  . lisinopril (PRINIVIL,ZESTRIL) 10 MG tablet    Sig: Take 1 tablet (10 mg total) by mouth daily.    Dispense:  90 tablet    Refill:  3  . metoprolol tartrate (LOPRESSOR) 100 MG tablet    Sig: Take 1 tablet (100 mg total) by mouth 2 (two) times daily.    Dispense:  180 tablet    Refill:  3  . spironolactone (ALDACTONE) 25 MG tablet    Sig: Take 1 tablet (  25 mg total) by mouth daily.    Dispense:  90 tablet    Refill:  3  . ALPRAZolam (XANAX) 0.25 MG tablet    Sig: TAKE 1 TABLET BY MOUTH THREE TIMES DAILY AS NEEDED FOR ANXIETY FOR SLEEP    Dispense:  30 tablet    Refill:  0    CMA served as scribe during this visit. History, Physical and Plan performed by medical provider. Documentation and orders reviewed and attested to.  Ann Held, DO

## 2017-06-18 NOTE — Patient Instructions (Signed)

## 2017-08-17 ENCOUNTER — Other Ambulatory Visit: Payer: Self-pay | Admitting: Family Medicine

## 2017-08-17 DIAGNOSIS — F419 Anxiety disorder, unspecified: Secondary | ICD-10-CM

## 2017-08-17 NOTE — Telephone Encounter (Signed)
Requesting: Alprazolam Contract: Yes UDS: 1.5.18 but cannot find results Last OV: 7.26.18 Next OV: Not scheduled Last Refill: 7.28.18   Please advise

## 2017-09-14 ENCOUNTER — Encounter: Payer: Self-pay | Admitting: Family Medicine

## 2017-09-14 NOTE — Telephone Encounter (Signed)
There is a GI bug going around but if it lasts more than a few days , you should be seen.   Dr Carlean Jews

## 2017-09-28 ENCOUNTER — Telehealth: Payer: Self-pay | Admitting: Family Medicine

## 2017-09-28 DIAGNOSIS — F419 Anxiety disorder, unspecified: Secondary | ICD-10-CM

## 2017-09-28 DIAGNOSIS — Z79899 Other long term (current) drug therapy: Secondary | ICD-10-CM

## 2017-09-29 MED ORDER — ALPRAZOLAM 0.25 MG PO TABS: ORAL_TABLET | ORAL | 2 refills | Status: DC

## 2017-09-29 NOTE — Telephone Encounter (Signed)
Refill x1   2 refills 

## 2017-09-29 NOTE — Telephone Encounter (Signed)
Refill Request: Alprazolam   Last RX:08/17/17 Last OV:06/18/2017 Next KP:TWSF Scheduled  UDS:11/28/2016 CSC:11/28/2016

## 2017-09-29 NOTE — Telephone Encounter (Signed)
rx faxed to [facility].  

## 2017-09-29 NOTE — Addendum Note (Signed)
Addended by: Kem Boroughs D on: 09/29/2017 04:30 PM   Modules accepted: Orders

## 2017-10-02 ENCOUNTER — Encounter: Payer: Self-pay | Admitting: Family Medicine

## 2017-10-05 ENCOUNTER — Encounter: Payer: Self-pay | Admitting: Family Medicine

## 2017-10-05 ENCOUNTER — Other Ambulatory Visit (HOSPITAL_COMMUNITY)
Admission: RE | Admit: 2017-10-05 | Discharge: 2017-10-05 | Disposition: A | Discharge status: Home / Self Care | Payer: 59 | Source: Ambulatory Visit | Attending: Family Medicine | Admitting: Family Medicine

## 2017-10-05 ENCOUNTER — Ambulatory Visit (INDEPENDENT_AMBULATORY_CARE_PROVIDER_SITE_OTHER): Payer: 59 | Admitting: Family Medicine

## 2017-10-05 VITALS — BP 123/67 | HR 70 | Temp 98.1°F | Resp 16 | Ht 63.0 in | Wt 196.0 lb

## 2017-10-05 DIAGNOSIS — R110 Nausea: Secondary | ICD-10-CM

## 2017-10-05 DIAGNOSIS — K529 Noninfective gastroenteritis and colitis, unspecified: Secondary | ICD-10-CM | POA: Diagnosis not present

## 2017-10-05 DIAGNOSIS — Z202 Contact with and (suspected) exposure to infections with a predominantly sexual mode of transmission: Secondary | ICD-10-CM | POA: Diagnosis not present

## 2017-10-05 LAB — POC URINALSYSI DIPSTICK (AUTOMATED)
BILIRUBIN UA: NEGATIVE
Glucose, UA: NEGATIVE
Ketones, UA: NEGATIVE
Leukocytes, UA: NEGATIVE
NITRITE UA: NEGATIVE
PH UA: 6 (ref 5.0–8.0)
PROTEIN UA: NEGATIVE
RBC UA: NEGATIVE
Spec Grav, UA: 1.01 (ref 1.010–1.025)
Urobilinogen, UA: 0.2 E.U./dL

## 2017-10-05 MED ORDER — HYOSCYAMINE SULFATE 0.125 MG SL SUBL: 0.1250 mg | SUBLINGUAL_TABLET | Freq: Four times a day (QID) | SUBLINGUAL | Status: DC | PRN

## 2017-10-05 NOTE — Assessment & Plan Note (Signed)
resoloving

## 2017-10-05 NOTE — Patient Instructions (Signed)
Diet for Irritable Bowel Syndrome When you have irritable bowel syndrome (IBS), the foods you eat and your eating habits are very important. IBS may cause various symptoms, such as abdominal pain, constipation, or diarrhea. Choosing the right foods can help ease discomfort caused by these symptoms. Work with your health care provider and dietitian to find the best eating plan to help control your symptoms. What general guidelines do I need to follow?  Keep a food diary. This will help you identify foods that cause symptoms. Write down: ? What you eat and when. ? What symptoms you have. ? When symptoms occur in relation to your meals.  Avoid foods that cause symptoms. Talk with your dietitian about other ways to get the same nutrients that are in these foods.  Eat more foods that contain fiber. Take a fiber supplement if directed by your dietitian.  Eat your meals slowly, in a relaxed setting.  Aim to eat 5-6 small meals per day. Do not skip meals.  Drink enough fluids to keep your urine clear or pale yellow.  Ask your health care provider if you should take an over-the-counter probiotic during flare-ups to help restore healthy gut bacteria.  If you have cramping or diarrhea, try making your meals low in fat and high in carbohydrates. Examples of carbohydrates are pasta, rice, whole grain breads and cereals, fruits, and vegetables.  If dairy products cause your symptoms to flare up, try eating less of them. You might be able to handle yogurt better than other dairy products because it contains bacteria that help with digestion. What foods are not recommended? The following are some foods and drinks that may worsen your symptoms:  Fatty foods, such as French fries.  Milk products, such as cheese or ice cream.  Chocolate.  Alcohol.  Products with caffeine, such as coffee.  Carbonated drinks, such as soda.  The items listed above may not be a complete list of foods and beverages to  avoid. Contact your dietitian for more information. What foods are good sources of fiber? Your health care provider or dietitian may recommend that you eat more foods that contain fiber. Fiber can help reduce constipation and other IBS symptoms. Add foods with fiber to your diet a little at a time so that your body can get used to them. Too much fiber at once might cause gas and swelling of your abdomen. The following are some foods that are good sources of fiber:  Apples.  Peaches.  Pears.  Berries.  Figs.  Broccoli (raw).  Cabbage.  Carrots.  Raw peas.  Kidney beans.  Lima beans.  Whole grain bread.  Whole grain cereal.  Where to find more information: International Foundation for Functional Gastrointestinal Disorders: www.iffgd.org National Institute of Diabetes and Digestive and Kidney Diseases: www.niddk.nih.gov/health-information/health-topics/digestive-diseases/ibs/Pages/facts.aspx This information is not intended to replace advice given to you by your health care provider. Make sure you discuss any questions you have with your health care provider. Document Released: 01/31/2004 Document Revised: 04/17/2016 Document Reviewed: 02/10/2014 Elsevier Interactive Patient Education  2018 Elsevier Inc.  

## 2017-10-05 NOTE — Progress Notes (Signed)
Patient ID: [name], female   DOB: 1970-12-27, 46 y.o.   MRN: [id number]     Subjective:  I acted as a Education administrator for Dr. Carollee Herter.  [name], [place]   Patient ID: [name], female    DOB: [birth year], 46 y.o.   MRN: [id number]  Chief Complaint  Patient presents with  . Diarrhea  . Nausea  . std screening    HPI  Patient is in today for diarrhea, nausea, and std screening.  Diarrhea and nausea has been going on for about 3 weeks.  She has just met someone new and would like to be screened. She only has some burning when she urinates. The stools were watery they are now more formed   Patient Care Team: Carollee Herter, Alferd Apa, DO as PCP - General Delsa Bern, MD as Consulting Physician (Obstetrics and Gynecology)   Past Medical History:  Diagnosis Date  . Allergy   . Asthma   . GERD (gastroesophageal reflux disease)    no meds  . H/O: myomectomy   . Hearing impaired    can hear on left side only  . Heart murmur   . Herpes genitalia   . Hypertension    pulmonary  . Refusal of blood transfusions as patient is Jehovah's Witness   . TIA (transient ischemic attack)     Past Surgical History:  Procedure Laterality Date  . BREAST LUMPECTOMY  01/06/11  . MYOMECTOMY  2004     2010 robotic  . OVARIAN CYST REMOVAL  2010   x2  . ROBOTIC ASSISTED LAP VAGINAL HYSTERECTOMY      Family History  Problem Relation Age of Onset  . Asthma Mother   . Arthritis Mother   . Hypertension Father   . Heart disease Maternal Grandmother        PCI  . Cancer Maternal Grandmother 66       breast  . Cancer Other 60       breast and possible colon  . Migraines Sister   . Diabetes Brother   . Heart attack Paternal Grandfather        before age 35    Social History   Socioeconomic History  . Marital status: Single    Spouse name: Not on file  . Number of children: Not on file  . Years of education: Not on file  . Highest education level: Not on file  Social Needs  .  Financial resource strain: Not on file  . Food insecurity - worry: Not on file  . Food insecurity - inability: Not on file  . Transportation needs - medical: Not on file  . Transportation needs - non-medical: Not on file  Occupational History  . Not on file  Tobacco Use  . Smoking status: Never Smoker  . Smokeless tobacco: Never Used  Substance and Sexual Activity  . Alcohol use: No    Alcohol/week: 0.0 oz  . Drug use: No  . Sexual activity: Yes    Partners: Male    Birth control/protection: Surgical    Comment: hyst  Other Topics Concern  . Not on file  Social History Narrative   Exercise---walk, zumba, american ballet video    Outpatient Medications Prior to Visit  Medication Sig Dispense Refill  . albuterol (PROVENTIL HFA;VENTOLIN HFA) 108 (90 Base) MCG/ACT inhaler Inhale 2 puffs into the lungs every 6 (six) hours as needed for wheezing. 1 Inhaler 2  . ALPRAZolam (XANAX) 0.25 MG tablet TAKE 1 TABLET  BY MOUTH THREE TIMES DAILY AS NEEDED FOR ANXIETY OR SLEEP 30 tablet 2  . Ascorbic Acid (VITAMIN C) 1000 MG tablet Take 1,000 mg by mouth daily.    Marland Kitchen aspirin 325 MG tablet Take 1 tablet (325 mg total) by mouth daily.    . cetirizine (ZYRTEC) 10 MG tablet Take 10 mg by mouth daily.    . cholecalciferol (VITAMIN D) 1000 UNITS tablet Take 1,000 Units by mouth daily.    Marland Kitchen estradiol (VIVELLE-DOT) 0.0375 MG/24HR Place 1 patch onto the skin 2 (two) times a week.    . fluticasone (FLONASE) 50 MCG/ACT nasal spray Place 2 sprays into both nostrils daily.  2  . ibuprofen (ADVIL,MOTRIN) 600 MG tablet Take 600 mg by mouth every 6 (six) hours as needed for headache or mild pain.     Marland Kitchen lisinopril (PRINIVIL,ZESTRIL) 10 MG tablet Take 1 tablet (10 mg total) by mouth daily. 90 tablet 3  . metoprolol tartrate (LOPRESSOR) 100 MG tablet Take 1 tablet (100 mg total) by mouth 2 (two) times daily. 180 tablet 3  . Multiple Vitamins-Calcium (ONE-A-DAY WOMENS PO) Take 1 tablet by mouth daily.    Marland Kitchen omeprazole  (PRILOSEC) 40 MG capsule Take 1 capsule (40 mg total) by mouth daily. 30 capsule 3  . spironolactone (ALDACTONE) 25 MG tablet Take 1 tablet (25 mg total) by mouth daily. 90 tablet 3  . valACYclovir (VALTREX) 500 MG tablet Take 500 mg by mouth daily.      No facility-administered medications prior to visit.     No Known Allergies  Review of Systems  Constitutional: Negative for fever and malaise/fatigue.  HENT: Negative for congestion.   Eyes: Negative for blurred vision.  Respiratory: Negative for cough and shortness of breath.   Cardiovascular: Negative for chest pain, palpitations and leg swelling.  Gastrointestinal: Positive for diarrhea and nausea. Negative for vomiting.  Genitourinary: Positive for dysuria.  Musculoskeletal: Negative for back pain.  Skin: Negative for rash.  Neurological: Negative for loss of consciousness and headaches.       Objective:    Physical Exam  Constitutional: She is oriented to person, place, and time. She appears well-developed and well-nourished.  HENT:  Head: Normocephalic and atraumatic.  Eyes: Conjunctivae and EOM are normal.  Neck: Normal range of motion. Neck supple. No JVD present. Carotid bruit is not present. No thyromegaly present.  Cardiovascular: Normal rate, regular rhythm and normal heart sounds.  No murmur heard. Pulmonary/Chest: Effort normal and breath sounds normal. No respiratory distress. She has no wheezes. She has no rales. She exhibits no tenderness.  Abdominal: Soft. There is tenderness in the left lower quadrant. There is no rigidity, no rebound, no guarding, no tenderness at McBurney's point and negative Murphy's sign.  Musculoskeletal: She exhibits no edema.  Neurological: She is alert and oriented to person, place, and time.  Psychiatric: She has a normal mood and affect.  Nursing note and vitals reviewed.   BP 123/67 (BP Location: Right Arm, Cuff Size: Normal)   Pulse 70   Temp 98.1 F (36.7 C) (Oral)   Resp  16   Ht _0  (1.6 m)   Wt 196 lb (88.9 kg)   LMP 04/18/2012   SpO2 97%   BMI 34.72 kg/m  Wt Readings from Last 3 Encounters:  10/05/17 196 lb (88.9 kg)  06/18/17 200 lb 9.6 oz (91 kg)  12/16/16 210 lb 6.4 oz (95.4 kg)   BP Readings from Last 3 Encounters:  10/05/17 123/67  06/18/17  124/60  12/16/16 (!) 82/58     Immunization History  Administered Date(s) Administered  . Influenza Split 12/25/2012  . Influenza Whole 08/29/2009, 07/30/2011, 08/31/2012  . Influenza,inj,Quad PF,6+ Mos 08/24/2013  . Influenza-Unspecified 08/24/2014, 09/12/2015, 09/25/2016  . Pneumococcal Polysaccharide-23 10/08/2010  . Td 01/05/2008    Health Maintenance  Topic Date Due  . INFLUENZA VACCINE  10/08/2017 (Originally 06/24/2017)  . MAMMOGRAM  10/08/2017 (Originally 06/12/2016)  . PAP SMEAR  10/08/2017 (Originally 05/09/2016)  . DEXA SCAN  10/08/2017 (Originally 06/06/2016)  . TETANUS/TDAP  01/04/2018  . HIV Screening  Completed    Lab Results  Component Value Date   WBC 9.3 01/04/2016   HGB 12.4 01/04/2016   HCT 38.2 01/04/2016   PLT 323 01/04/2016   GLUCOSE 107 (H) 01/04/2016   CHOL 168 03/06/2015   TRIG 79.0 03/06/2015   HDL 60.10 03/06/2015   LDLCALC 92 03/06/2015   ALT 21 01/04/2016   AST 21 01/04/2016   NA 139 01/04/2016   K 4.0 01/04/2016   CL 102 01/04/2016   CREATININE 0.90 01/04/2016   BUN 12 01/04/2016   CO2 28 01/04/2016   TSH 0.56 03/06/2015   INR 1.11 11/09/2012   HGBA1C 5.1 11/10/2012    Lab Results  Component Value Date   TSH 0.56 03/06/2015   Lab Results  Component Value Date   WBC 9.3 01/04/2016   HGB 12.4 01/04/2016   HCT 38.2 01/04/2016   MCV 91.4 01/04/2016   PLT 323 01/04/2016   Lab Results  Component Value Date   NA 139 01/04/2016   K 4.0 01/04/2016   CO2 28 01/04/2016   GLUCOSE 107 (H) 01/04/2016   BUN 12 01/04/2016   CREATININE 0.90 01/04/2016   BILITOT 0.5 01/04/2016   ALKPHOS 62 01/04/2016   AST 21 01/04/2016   ALT 21 01/04/2016   PROT  7.1 01/04/2016   ALBUMIN 3.8 01/04/2016   CALCIUM 9.4 01/04/2016   ANIONGAP 9 01/04/2016   GFR 99.07 03/06/2015   Lab Results  Component Value Date   CHOL 168 03/06/2015   Lab Results  Component Value Date   HDL 60.10 03/06/2015   Lab Results  Component Value Date   LDLCALC 92 03/06/2015   Lab Results  Component Value Date   TRIG 79.0 03/06/2015   Lab Results  Component Value Date   CHOLHDL 3 03/06/2015   Lab Results  Component Value Date   HGBA1C 5.1 11/10/2012         Assessment & Plan:   Problem List Items Addressed This Visit      Unprioritized   Gastroenteritis    Pt did e visit and was told she had a virus-- symptoms have improved but  Not completely Add fiber to diet ,probiotic rto prn      Relevant Medications   hyoscyamine (LEVSIN SL) SL tablet 0.125 mg   Nausea without vomiting    resoloving      STD exposure - Primary   Relevant Orders   Urine cytology ancillary only   POCT Urinalysis Dipstick (Automated) (Completed)   HIV antibody   RPR      I am having [name]. Carnero maintain her Multiple Vitamins-Calcium (ONE-A-DAY WOMENS PO), aspirin, valACYclovir, vitamin C, cetirizine, cholecalciferol, ibuprofen, omeprazole, estradiol, fluticasone, albuterol, lisinopril, metoprolol tartrate, spironolactone, and ALPRAZolam. We will continue to administer hyoscyamine.  Meds ordered this encounter  Medications  . hyoscyamine (LEVSIN SL) SL tablet 0.125 mg    CMA served as scribe during this visit.  History, Physical and Plan performed by medical provider. Documentation and orders reviewed and attested to.  Ann Held, DO

## 2017-10-05 NOTE — Assessment & Plan Note (Signed)
Pt did e visit and was told she had a virus-- symptoms have improved but  Not completely Add fiber to diet ,probiotic rto prn

## 2017-10-06 LAB — RPR: RPR Ser Ql: NONREACTIVE

## 2017-10-06 LAB — URINE CYTOLOGY ANCILLARY ONLY
Chlamydia: NEGATIVE
Neisseria Gonorrhea: NEGATIVE
TRICH (WINDOWPATH): NEGATIVE

## 2017-10-06 LAB — HIV ANTIBODY (ROUTINE TESTING W REFLEX): HIV 1&2 Ab, 4th Generation: NONREACTIVE

## 2017-10-14 ENCOUNTER — Encounter: Payer: Self-pay | Admitting: Family Medicine

## 2018-03-15 ENCOUNTER — Encounter: Payer: Self-pay | Admitting: Family Medicine

## 2018-03-15 ENCOUNTER — Ambulatory Visit (INDEPENDENT_AMBULATORY_CARE_PROVIDER_SITE_OTHER): Payer: 59 | Admitting: Family Medicine

## 2018-03-15 VITALS — BP 118/68 | HR 77 | Temp 98.0°F | Resp 16 | Ht 63.0 in | Wt 199.6 lb

## 2018-03-15 DIAGNOSIS — Z79899 Other long term (current) drug therapy: Secondary | ICD-10-CM | POA: Diagnosis not present

## 2018-03-15 DIAGNOSIS — I10 Essential (primary) hypertension: Secondary | ICD-10-CM | POA: Diagnosis not present

## 2018-03-15 DIAGNOSIS — Z0000 Encounter for general adult medical examination without abnormal findings: Secondary | ICD-10-CM

## 2018-03-15 DIAGNOSIS — Z114 Encounter for screening for human immunodeficiency virus [HIV]: Secondary | ICD-10-CM

## 2018-03-15 DIAGNOSIS — Z23 Encounter for immunization: Secondary | ICD-10-CM | POA: Diagnosis not present

## 2018-03-15 DIAGNOSIS — J4520 Mild intermittent asthma, uncomplicated: Secondary | ICD-10-CM | POA: Diagnosis not present

## 2018-03-15 MED ORDER — LISINOPRIL 10 MG PO TABS: 10.0000 mg | ORAL_TABLET | Freq: Every day | ORAL | 3 refills | Status: DC

## 2018-03-15 MED ORDER — METOPROLOL TARTRATE 100 MG PO TABS: 100.0000 mg | ORAL_TABLET | Freq: Two times a day (BID) | ORAL | 3 refills | Status: DC

## 2018-03-15 MED ORDER — SPIRONOLACTONE 25 MG PO TABS: 25.0000 mg | ORAL_TABLET | Freq: Every day | ORAL | 3 refills | Status: DC

## 2018-03-15 MED ORDER — ALBUTEROL SULFATE HFA 108 (90 BASE) MCG/ACT IN AERS: 2.0000 | INHALATION_SPRAY | Freq: Four times a day (QID) | RESPIRATORY_TRACT | 2 refills | Status: DC | PRN

## 2018-03-15 NOTE — Progress Notes (Signed)
Subjective:     [name] is a 47 y.o. female and is here for a comprehensive physical exam. The patient reports no problems.  Social History   Socioeconomic History  . Marital status: Single    Spouse name: Not on file  . Number of children: Not on file  . Years of education: Not on file  . Highest education level: Not on file  Occupational History  . Not on file  Social Needs  . Financial resource strain: Not on file  . Food insecurity:    Worry: Not on file    Inability: Not on file  . Transportation needs:    Medical: Not on file    Non-medical: Not on file  Tobacco Use  . Smoking status: Never Smoker  . Smokeless tobacco: Never Used  Substance and Sexual Activity  . Alcohol use: No    Alcohol/week: 0.0 oz  . Drug use: No  . Sexual activity: Yes    Partners: Male    Birth control/protection: Surgical    Comment: hyst  Lifestyle  . Physical activity:    Days per week: Not on file    Minutes per session: Not on file  . Stress: Not on file  Relationships  . Social connections:    Talks on phone: Not on file    Gets together: Not on file    Attends religious service: Not on file    Active member of club or organization: Not on file    Attends meetings of clubs or organizations: Not on file    Relationship status: Not on file  . Intimate partner violence:    Fear of current or ex partner: Not on file    Emotionally abused: Not on file    Physically abused: Not on file    Forced sexual activity: Not on file  Other Topics Concern  . Not on file  Social History Narrative   Exercise---walk, zumba, yoga, pole dancing class   Health Maintenance  Topic Date Due  . DEXA SCAN  06/06/2016  . TETANUS/TDAP  01/04/2018  . INFLUENZA VACCINE  06/24/2018  . MAMMOGRAM  07/23/2018  . PAP SMEAR  07/23/2020  . HIV Screening  Completed    The following portions of the patient's history were reviewed and updated as appropriate:  She  has a past medical history of  Allergy, Asthma, GERD (gastroesophageal reflux disease), H/O: myomectomy, Hearing impaired, Heart murmur, Herpes genitalia, Hypertension, Refusal of blood transfusions as patient is Jehovah's Witness, and TIA (transient ischemic attack). She does not have any pertinent problems on file. She  has a past surgical history that includes Myomectomy (2004  ); Ovarian cyst removal (2010); Breast lumpectomy (01/06/11); Robotic assisted lap vaginal hysterectomy; Cystoscopy (05/19/2012); and Robotic assisted laparoscopic ovarian cystectomy (Right, 03/20/2015). Her family history includes Arthritis in her mother; Asthma in her mother; Cancer (age of onset: 62) in her other; Cancer (age of onset: 63) in her maternal grandmother; Diabetes in her brother; Heart attack in her paternal grandfather; Heart disease in her maternal grandmother; Hypertension in her father; Migraines in her sister. She  reports that she has never smoked. She has never used smokeless tobacco. She reports that she does not drink alcohol or use drugs. She has a current medication list which includes the following prescription(s): albuterol, alprazolam, vitamin c, aspirin, cetirizine, cholecalciferol, estradiol, fluticasone, lisinopril, metoprolol tartrate, multiple vitamins-calcium, spironolactone, and valacyclovir. Current Outpatient Medications on File Prior to Visit  Medication Sig Dispense Refill  .  ALPRAZolam (XANAX) 0.25 MG tablet TAKE 1 TABLET BY MOUTH THREE TIMES DAILY AS NEEDED FOR ANXIETY OR SLEEP 30 tablet 2  . Ascorbic Acid (VITAMIN C) 1000 MG tablet Take 1,000 mg by mouth daily.    Marland Kitchen aspirin 325 MG tablet Take 1 tablet (325 mg total) by mouth daily.    . cetirizine (ZYRTEC) 10 MG tablet Take 10 mg by mouth daily.    . cholecalciferol (VITAMIN D) 1000 UNITS tablet Take 1,000 Units by mouth daily.    Marland Kitchen estradiol (VIVELLE-DOT) 0.0375 MG/24HR Place 1 patch onto the skin 2 (two) times a week.    . fluticasone (FLONASE) 50 MCG/ACT nasal  spray Place 2 sprays into both nostrils daily.  2  . Multiple Vitamins-Calcium (ONE-A-DAY WOMENS PO) Take 1 tablet by mouth daily.    . valACYclovir (VALTREX) 500 MG tablet Take 500 mg by mouth daily.      No current facility-administered medications on file prior to visit.    She has No Known Allergies..  Review of Systems Review of Systems  Constitutional: Negative for activity change, appetite change and fatigue.  HENT: Negative for hearing loss, congestion, tinnitus and ear discharge.  dentist q45m Eyes: Negative for visual disturbance (see optho q1y -- vision corrected to 20/20 with glasses).  Respiratory: Negative for cough, chest tightness and shortness of breath.   Cardiovascular: Negative for chest pain, palpitations and leg swelling.  Gastrointestinal: Negative for abdominal pain, diarrhea, constipation and abdominal distention.  Genitourinary: Negative for urgency, frequency, decreased urine volume and difficulty urinating.  Musculoskeletal: Negative for back pain, arthralgias and gait problem.  Skin: Negative for color change, pallor and rash.  Neurological: Negative for dizziness, light-headedness, numbness and headaches.  Hematological: Negative for adenopathy. Does not bruise/bleed easily.  Psychiatric/Behavioral: Negative for suicidal ideas, confusion, sleep disturbance, self-injury, dysphoric mood, decreased concentration and agitation.       Objective:    BP 118/68 (BP Location: Left Arm, Cuff Size: Large)   Pulse 77   Temp 98 F (36.7 C) (Oral)   Resp 16   Ht 5\' 3"  (1.6 m)   Wt 199 lb 9.6 oz (90.5 kg)   LMP 04/18/2012   SpO2 99%   BMI 35.36 kg/m  General appearance: alert, cooperative, appears stated age and no distress Head: Normocephalic, without obvious abnormality, atraumatic Eyes: conjunctivae/corneas clear. PERRL, EOM's intact. Fundi benign. Ears: normal TM's and external ear canals both ears Nose: Nares normal. Septum midline. Mucosa normal. No  drainage or sinus tenderness. Throat: lips, mucosa, and tongue normal; teeth and gums normal Neck: no adenopathy, no carotid bruit, no JVD, supple, symmetrical, trachea midline and thyroid not enlarged, symmetric, no tenderness/mass/nodules Back: symmetric, no curvature. ROM normal. No CVA tenderness. Lungs: clear to auscultation bilaterally Breasts:  Heart: S1, S2 normal Abdomen: soft, non-tender; bowel sounds normal; no masses,  no organomegaly Pelvic: deferred Extremities: extremities normal, atraumatic, no cyanosis or edema Pulses: 2+ and symmetric Skin: Skin color, texture, turgor normal. No rashes or lesions Lymph nodes: Cervical, supraclavicular, and axillary nodes normal. Neurologic: Alert and oriented X 3, normal strength and tone. Normal symmetric reflexes. Normal coordination and gait    Assessment:    Healthy female exam.      Plan:    ghm utd Check labs  See After Visit Summary for Counseling Recommendations    1. Preventative health care See avs ghm utd Check labs - CBC with Differential/Platelet - Comprehensive metabolic panel - Lipid panel - TSH - HIV antibody - RPR  2. Encounter for screening for HIV  - HIV antibody  3. Essential hypertension Well controlled, no changes to meds. Encouraged heart healthy diet such as the DASH diet and exercise as tolerated.  - lisinopril (PRINIVIL,ZESTRIL) 10 MG tablet; Take 1 tablet (10 mg total) by mouth daily.  Dispense: 90 tablet; Refill: 3 - metoprolol tartrate (LOPRESSOR) 100 MG tablet; Take 1 tablet (100 mg total) by mouth 2 (two) times daily.  Dispense: 180 tablet; Refill: 3 - spironolactone (ALDACTONE) 25 MG tablet; Take 1 tablet (25 mg total) by mouth daily.  Dispense: 90 tablet; Refill: 3  4. Mild intermittent asthma, unspecified whether complicated stable - albuterol (PROVENTIL HFA;VENTOLIN HFA) 108 (90 Base) MCG/ACT inhaler; Inhale 2 puffs into the lungs every 6 (six) hours as needed for wheezing.   Dispense: 1 Inhaler; Refill: 2  5. High risk medication use   - Pain Mgmt, Profile 8 w/Conf, U  6. Need for Tdap vaccination   - Tdap vaccine greater than or equal to 7yo IM

## 2018-03-15 NOTE — Patient Instructions (Signed)
Preventive Care 40-64 Years, Female Preventive care refers to lifestyle choices and visits with your health care provider that can promote health and wellness. What does preventive care include?  A yearly physical exam. This is also called an annual well check.  Dental exams once or twice a year.  Routine eye exams. Ask your health care provider how often you should have your eyes checked.  Personal lifestyle choices, including: ? Daily care of your teeth and gums. ? Regular physical activity. ? Eating a healthy diet. ? Avoiding tobacco and drug use. ? Limiting alcohol use. ? Practicing safe sex. ? Taking low-dose aspirin daily starting at age 58. ? Taking vitamin and mineral supplements as recommended by your health care provider. What happens during an annual well check? The services and screenings done by your health care provider during your annual well check will depend on your age, overall health, lifestyle risk factors, and family history of disease. Counseling Your health care provider may ask you questions about your:  Alcohol use.  Tobacco use.  Drug use.  Emotional well-being.  Home and relationship well-being.  Sexual activity.  Eating habits.  Work and work Statistician.  Method of birth control.  Menstrual cycle.  Pregnancy history.  Screening You may have the following tests or measurements:  Height, weight, and BMI.  Blood pressure.  Lipid and cholesterol levels. These may be checked every 5 years, or more frequently if you are over 81 years old.  Skin check.  Lung cancer screening. You may have this screening every year starting at age 78 if you have a 30-pack-year history of smoking and currently smoke or have quit within the past 15 years.  Fecal occult blood test (FOBT) of the stool. You may have this test every year starting at age 65.  Flexible sigmoidoscopy or colonoscopy. You may have a sigmoidoscopy every 5 years or a colonoscopy  every 10 years starting at age 30.  Hepatitis C blood test.  Hepatitis B blood test.  Sexually transmitted disease (STD) testing.  Diabetes screening. This is done by checking your blood sugar (glucose) after you have not eaten for a while (fasting). You may have this done every 1-3 years.  Mammogram. This may be done every 1-2 years. Talk to your health care provider about when you should start having regular mammograms. This may depend on whether you have a family history of breast cancer.  BRCA-related cancer screening. This may be done if you have a family history of breast, ovarian, tubal, or peritoneal cancers.  Pelvic exam and Pap test. This may be done every 3 years starting at age 80. Starting at age 36, this may be done every 5 years if you have a Pap test in combination with an HPV test.  Bone density scan. This is done to screen for osteoporosis. You may have this scan if you are at high risk for osteoporosis.  Discuss your test results, treatment options, and if necessary, the need for more tests with your health care provider. Vaccines Your health care provider may recommend certain vaccines, such as:  Influenza vaccine. This is recommended every year.  Tetanus, diphtheria, and acellular pertussis (Tdap, Td) vaccine. You may need a Td booster every 10 years.  Varicella vaccine. You may need this if you have not been vaccinated.  Zoster vaccine. You may need this after age 5.  Measles, mumps, and rubella (MMR) vaccine. You may need at least one dose of MMR if you were born in  1957 or later. You may also need a second dose.  Pneumococcal 13-valent conjugate (PCV13) vaccine. You may need this if you have certain conditions and were not previously vaccinated.  Pneumococcal polysaccharide (PPSV23) vaccine. You may need one or two doses if you smoke cigarettes or if you have certain conditions.  Meningococcal vaccine. You may need this if you have certain  conditions.  Hepatitis A vaccine. You may need this if you have certain conditions or if you travel or work in places where you may be exposed to hepatitis A.  Hepatitis B vaccine. You may need this if you have certain conditions or if you travel or work in places where you may be exposed to hepatitis B.  Haemophilus influenzae type b (Hib) vaccine. You may need this if you have certain conditions.  Talk to your health care provider about which screenings and vaccines you need and how often you need them. This information is not intended to replace advice given to you by your health care provider. Make sure you discuss any questions you have with your health care provider. Document Released: 12/07/2015 Document Revised: 07/30/2016 Document Reviewed: 09/11/2015 Elsevier Interactive Patient Education  2018 Elsevier Inc[employer]  

## 2018-03-16 LAB — CBC WITH DIFFERENTIAL/PLATELET
BASOS PCT: 1.7 % (ref 0.0–3.0)
Basophils Absolute: 0.1 10*3/uL (ref 0.0–0.1)
Eosinophils Absolute: 0.2 10*3/uL (ref 0.0–0.7)
Eosinophils Relative: 3.3 % (ref 0.0–5.0)
HCT: 39.5 % (ref 36.0–46.0)
Hemoglobin: 13.2 g/dL (ref 12.0–15.0)
LYMPHS ABS: 3.8 10*3/uL (ref 0.7–4.0)
Lymphocytes Relative: 52.9 % — ABNORMAL HIGH (ref 12.0–46.0)
MCHC: 33.4 g/dL (ref 30.0–36.0)
MCV: 91.4 fl (ref 78.0–100.0)
MONO ABS: 0.5 10*3/uL (ref 0.1–1.0)
Monocytes Relative: 7 % (ref 3.0–12.0)
NEUTROS ABS: 2.5 10*3/uL (ref 1.4–7.7)
NEUTROS PCT: 35.1 % — AB (ref 43.0–77.0)
Platelets: 308 10*3/uL (ref 150.0–400.0)
RBC: 4.32 Mil/uL (ref 3.87–5.11)
RDW: 13.3 % (ref 11.5–15.5)
WBC: 7.3 10*3/uL (ref 4.0–10.5)

## 2018-03-16 LAB — COMPREHENSIVE METABOLIC PANEL
ALT: 16 U/L (ref 0–35)
AST: 19 U/L (ref 0–37)
Albumin: 4 g/dL (ref 3.5–5.2)
Alkaline Phosphatase: 61 U/L (ref 39–117)
BUN: 15 mg/dL (ref 6–23)
CHLORIDE: 103 meq/L (ref 96–112)
CO2: 30 mEq/L (ref 19–32)
Calcium: 9.7 mg/dL (ref 8.4–10.5)
Creatinine, Ser: 0.84 mg/dL (ref 0.40–1.20)
GFR: 93.7 mL/min (ref >60.00)
GLUCOSE: 72 mg/dL (ref 70–99)
Potassium: 4.6 mEq/L (ref 3.5–5.1)
SODIUM: 137 meq/L (ref 135–145)
Total Bilirubin: 0.3 mg/dL (ref 0.2–1.2)
Total Protein: 7.1 g/dL (ref 6.0–8.3)

## 2018-03-16 LAB — LIPID PANEL
CHOLESTEROL: 143 mg/dL (ref 0–200)
HDL: 57 mg/dL (ref >39.00)
LDL CALC: 62 mg/dL (ref 0–99)
NONHDL: 86.36
Total CHOL/HDL Ratio: 3
Triglycerides: 120 mg/dL (ref 0.0–149.0)
VLDL: 24 mg/dL (ref 0.0–40.0)

## 2018-03-16 LAB — TSH: TSH: 1.1 u[IU]/mL (ref 0.35–4.50)

## 2018-03-16 LAB — HIV ANTIBODY (ROUTINE TESTING W REFLEX): HIV 1&2 Ab, 4th Generation: NONREACTIVE

## 2018-03-16 LAB — RPR: RPR Ser Ql: NONREACTIVE

## 2018-03-17 LAB — PAIN MGMT, PROFILE 8 W/CONF, U
6 ACETYLMORPHINE: NEGATIVE ng/mL (ref <10)
ALPHAHYDROXYMIDAZOLAM: NEGATIVE ng/mL (ref <50)
Alcohol Metabolites: NEGATIVE ng/mL (ref <500)
Alphahydroxyalprazolam: NEGATIVE ng/mL (ref <25)
Alphahydroxytriazolam: NEGATIVE ng/mL (ref <50)
Aminoclonazepam: NEGATIVE ng/mL (ref <25)
Amphetamines: NEGATIVE ng/mL (ref <500)
BENZODIAZEPINES: NEGATIVE ng/mL (ref <100)
Buprenorphine, Urine: NEGATIVE ng/mL (ref <5)
Cocaine Metabolite: NEGATIVE ng/mL (ref <150)
Creatinine: 71.6 mg/dL
HYDROXYETHYLFLURAZEPAM: NEGATIVE ng/mL (ref <50)
Lorazepam: NEGATIVE ng/mL (ref <50)
MARIJUANA METABOLITE: NEGATIVE ng/mL (ref <20)
MDMA: NEGATIVE ng/mL (ref <500)
NORDIAZEPAM: NEGATIVE ng/mL (ref <50)
OPIATES: NEGATIVE ng/mL (ref <100)
OXYCODONE: NEGATIVE ng/mL (ref <100)
Oxazepam: NEGATIVE ng/mL (ref <50)
Oxidant: NEGATIVE ug/mL (ref <200)
Temazepam: NEGATIVE ng/mL (ref <50)
pH: 7.13 (ref 4.5–9.0)

## 2018-03-19 ENCOUNTER — Encounter: Payer: Self-pay | Admitting: Family Medicine

## 2018-03-22 NOTE — Telephone Encounter (Signed)
If there is fluid in her ear she would need to see ENT not audiology but if it is better ok to refer to audiology

## 2018-03-23 ENCOUNTER — Other Ambulatory Visit: Payer: Self-pay | Admitting: *Deleted

## 2018-03-23 DIAGNOSIS — H9190 Unspecified hearing loss, unspecified ear: Secondary | ICD-10-CM

## 2018-03-28 ENCOUNTER — Other Ambulatory Visit: Payer: Self-pay | Admitting: Family Medicine

## 2018-03-28 DIAGNOSIS — F419 Anxiety disorder, unspecified: Secondary | ICD-10-CM

## 2018-03-31 NOTE — Telephone Encounter (Signed)
Database ran and is on your desk for review.  Last filled per database: 02/16/18 Last written: 09/29/17 Last ov: 03/15/18 Next ov: none Contract: 03/16/19 UDS: 03/16/19

## 2018-08-04 LAB — HM MAMMOGRAPHY

## 2018-08-30 ENCOUNTER — Telehealth: Payer: Self-pay

## 2018-08-30 NOTE — Telephone Encounter (Signed)
Copied from [place] [phone]. Topic: General - Other >> Aug 30, 2018  3:14 PM Sheran Luz wrote: Reason for CRM: Pt is requesting to transfer to [facility] from Dr. Carollee Herter

## 2018-08-30 NOTE — Telephone Encounter (Signed)
Please advise 

## 2018-08-30 NOTE — Telephone Encounter (Signed)
Ok with me 

## 2018-09-22 ENCOUNTER — Encounter: Payer: Self-pay | Admitting: Family Medicine

## 2018-09-22 ENCOUNTER — Ambulatory Visit (INDEPENDENT_AMBULATORY_CARE_PROVIDER_SITE_OTHER): Payer: 59 | Admitting: Family Medicine

## 2018-09-22 VITALS — BP 132/88 | HR 81 | Temp 97.8°F | Ht 63.0 in | Wt 196.2 lb

## 2018-09-22 DIAGNOSIS — Z6834 Body mass index (BMI) 34.0-34.9, adult: Secondary | ICD-10-CM | POA: Diagnosis not present

## 2018-09-22 DIAGNOSIS — Z7689 Persons encountering health services in other specified circumstances: Secondary | ICD-10-CM | POA: Diagnosis not present

## 2018-09-22 DIAGNOSIS — F419 Anxiety disorder, unspecified: Secondary | ICD-10-CM | POA: Diagnosis not present

## 2018-09-22 DIAGNOSIS — I10 Essential (primary) hypertension: Secondary | ICD-10-CM | POA: Diagnosis not present

## 2018-09-22 MED ORDER — METOPROLOL TARTRATE 100 MG PO TABS: 100.0000 mg | ORAL_TABLET | Freq: Two times a day (BID) | ORAL | 3 refills | Status: DC

## 2018-09-22 MED ORDER — SPIRONOLACTONE 25 MG PO TABS: 25.0000 mg | ORAL_TABLET | Freq: Every day | ORAL | 3 refills | Status: DC

## 2018-09-22 MED ORDER — LISINOPRIL 10 MG PO TABS: 10.0000 mg | ORAL_TABLET | Freq: Every day | ORAL | 3 refills | Status: DC

## 2018-09-22 MED ORDER — ALPRAZOLAM 0.25 MG PO TABS: 0.2500 mg | ORAL_TABLET | Freq: Two times a day (BID) | ORAL | 2 refills | Status: DC | PRN

## 2018-09-22 NOTE — Progress Notes (Signed)
[name] is a 47 y.o. female  Chief Complaint  Patient presents with  . Establish Care    pt wants to establish care and would like to discuss weight loss options and hypertension meds , bp has been stable     HPI: [name] is a 48 y.o. female here to establish care with our office. She is requesting 90 day supply of HTN meds as well as refill of xanax 0.25mg . She takes 1/2 of 0.25mg  tab BID PRN.  Pt has been actively trying to lose weight. Last year she underwent a non-invasive "fat-melting procedure".  She is using an app as part of a program which monitors food, weight, etc.Program is sponsored by her work.  Wt Readings from Last 3 Encounters:  09/22/18 196 lb 3.2 oz (89 kg)  03/15/18 199 lb 9.6 oz (90.5 kg)  10/05/17 196 lb (88.9 kg)  she does not eat red meat, she has 1 sweet every 1-2 days.  She exercises regularly. Yoga 2x/wk, zumba 2x/wk, weights 1x/wk  Specialists: dental issues currently; GYN  Last CPE, labs: 02/2018  Last PAP: follows with GYN, still has cervix and gets PAP q3 Last mammo: 07/2017  Med refills needed today: as above   Past Medical History:  Diagnosis Date  . Allergy   . Asthma   . GERD (gastroesophageal reflux disease)    no meds  . H/O: myomectomy   . Hearing impaired    can hear on left side only  . Heart murmur   . Herpes genitalia   . Hypertension    pulmonary  . Refusal of blood transfusions as patient is Jehovah's Witness   . TIA (transient ischemic attack)     Past Surgical History:  Procedure Laterality Date  . BREAST LUMPECTOMY  01/06/11  . CYSTOSCOPY  05/19/2012   Procedure: CYSTOSCOPY;  Surgeon: Alwyn Pea, MD;  Location: [facility];  Service: Gynecology;  Laterality: N/A;  . MYOMECTOMY  2004     2010 robotic  . OVARIAN CYST REMOVAL  2010   x2  . ROBOTIC ASSISTED LAP VAGINAL HYSTERECTOMY    . ROBOTIC ASSISTED LAPAROSCOPIC OVARIAN CYSTECTOMY Right 03/20/2015   Procedure: ROBOTIC ASSISTED LAPAROSCOPIC  Salpingectomy OVARIAN CYSTECTOMY, pelvic washings, excision of peritoneal pseudocyst.;  Surgeon: Delsa Bern, MD;  Location: [facility];  Service: Gynecology;  Laterality: Right;    Social History   Socioeconomic History  . Marital status: Single    Spouse name: Not on file  . Number of children: Not on file  . Years of education: Not on file  . Highest education level: Not on file  Occupational History  . Not on file  Social Needs  . Financial resource strain: Not on file  . Food insecurity:    Worry: Not on file    Inability: Not on file  . Transportation needs:    Medical: Not on file    Non-medical: Not on file  Tobacco Use  . Smoking status: Never Smoker  . Smokeless tobacco: Never Used  Substance and Sexual Activity  . Alcohol use: No    Alcohol/week: 0.0 standard drinks  . Drug use: No  . Sexual activity: Yes    Partners: Male    Birth control/protection: Surgical    Comment: hyst  Lifestyle  . Physical activity:    Days per week: Not on file    Minutes per session: Not on file  . Stress: Not on file  Relationships  . Social connections:  Talks on phone: Not on file    Gets together: Not on file    Attends religious service: Not on file    Active member of club or organization: Not on file    Attends meetings of clubs or organizations: Not on file    Relationship status: Not on file  . Intimate partner violence:    Fear of current or ex partner: Not on file    Emotionally abused: Not on file    Physically abused: Not on file    Forced sexual activity: Not on file  Other Topics Concern  . Not on file  Social History Narrative   Exercise---walk, zumba, yoga, pole dancing class    Family History  Problem Relation Age of Onset  . Asthma Mother   . Arthritis Mother   . Hypertension Father   . Heart disease Maternal Grandmother        PCI  . Cancer Maternal Grandmother 32       breast  . Cancer Other 60       breast and possible colon  . Migraines  Sister   . Diabetes Brother   . Heart attack Paternal Grandfather        before age 74     Immunization History  Administered Date(s) Administered  . Influenza Split 12/25/2012, 09/09/2017  . Influenza Whole 08/29/2009, 07/30/2011, 08/31/2012  . Influenza,inj,Quad PF,6+ Mos 08/24/2013  . Influenza-Unspecified 08/24/2014, 09/12/2015, 09/25/2016  . Pneumococcal Polysaccharide-23 10/08/2010  . Td 01/05/2008  . Tdap 03/15/2018    Outpatient Encounter Medications as of 09/22/2018  Medication Sig Note  . albuterol (PROVENTIL HFA;VENTOLIN HFA) 108 (90 Base) MCG/ACT inhaler Inhale 2 puffs into the lungs every 6 (six) hours as needed for wheezing.   Marland Kitchen ALPRAZolam (XANAX) 0.25 MG tablet TAKE 1 TABLET BY MOUTH THREE TIMES DAILY AS NEEDED FOR ANXIETY OR SLEEP   . Ascorbic Acid (VITAMIN C) 1000 MG tablet Take 1,000 mg by mouth daily.   Marland Kitchen aspirin 325 MG tablet Take 1 tablet (325 mg total) by mouth daily.   . cetirizine (ZYRTEC) 10 MG tablet Take 10 mg by mouth daily.   . chlorhexidine (PERIDEX) 0.12 % solution RINSE WITH 15ML (1 CAPFUL) FOR 30 SECONDS AND SPIT USE TWICE DAILY IN MORNING AND EVENING AFTER BRUSHING . EXPECTORATE AFTER RINSING. DO NOT   . cholecalciferol (VITAMIN D) 1000 UNITS tablet Take 1,000 Units by mouth daily.   Marland Kitchen doxycycline (VIBRAMYCIN) 100 MG capsule TAKE 1 CAPSULE BY MOUTH TWICE DAILY UNTIL GONE. START AFTER COMPLETION OF OTHER ANTIBIOTIC   . estradiol (VIVELLE-DOT) 0.0375 MG/24HR Place 1 patch onto the skin 2 (two) times a week. 10/21/2016: Received from: External Pharmacy  . fluticasone (FLONASE) 50 MCG/ACT nasal spray Place 2 sprays into both nostrils daily.   Marland Kitchen HYDROcodone-acetaminophen (NORCO/VICODIN) 5-325 MG tablet Take 1 tablet by mouth every 6 (six) hours as needed. for pain   . ibuprofen (ADVIL,MOTRIN) 600 MG tablet TAKE 1 TABLET BY MOUTH EVERY 6 HOURS DO NOT EXCEED 3200MG  PER DAY   . lisinopril (PRINIVIL,ZESTRIL) 10 MG tablet Take 1 tablet (10 mg total) by mouth  daily.   . metoprolol tartrate (LOPRESSOR) 100 MG tablet Take 1 tablet (100 mg total) by mouth 2 (two) times daily.   . Multiple Vitamins-Calcium (ONE-A-DAY WOMENS PO) Take 1 tablet by mouth daily.   Marland Kitchen oxyCODONE-acetaminophen (PERCOCET/ROXICET) 5-325 MG tablet oxycodone-acetaminophen 5 mg-325 mg tablet   . penicillin v potassium (VEETID) 500 MG tablet TAKE 1 TABLET BY MOUTH  4 TIMES DAILY UNTIL GONE   . spironolactone (ALDACTONE) 25 MG tablet Take 1 tablet (25 mg total) by mouth daily.   . valACYclovir (VALTREX) 500 MG tablet Take 500 mg by mouth daily.  03/07/2015: .   No facility-administered encounter medications on file as of 09/22/2018.      ROS: Gen: no fever, chills  Skin: no rash, itching ENT: no ear pain, ear drainage, nasal congestion, rhinorrhea, sinus pressure, sore throat Eyes: no blurry vision, double vision Resp: no cough, wheeze,SOB CV: no CP, palpitations, LE edema,  GI: no heartburn, n/v/d/c, abd pain GU: no dysuria, urgency, frequency, hematuria  MSK: no joint pain, myalgias, back pain Neuro: no dizziness, headache, weakness, vertigo Psych: no depression, anxiety, insomnia   No Known Allergies  BP 132/88 (BP Location: Left Arm, Patient Position: Sitting, Cuff Size: Normal)   Pulse 81   Temp 97.8 F (36.6 C) (Oral)   Ht 5\' 3"  (1.6 m)   Wt 196 lb 3.2 oz (89 kg)   LMP 04/18/2012   SpO2 98%   BMI 34.76 kg/m  BP Readings from Last 3 Encounters:  09/22/18 132/88  03/15/18 118/68  10/05/17 123/67   Physical Exam  Constitutional: She is oriented to person, place, and time. She appears well-developed and well-nourished. No distress.  Neck: Neck supple. No JVD present. No thyromegaly present.  Cardiovascular: Normal rate, regular rhythm, normal heart sounds and intact distal pulses.  No murmur heard. Pulmonary/Chest: Effort normal and breath sounds normal. No respiratory distress. She has no wheezes.  Musculoskeletal: She exhibits no edema.  Neurological: She  is alert and oriented to person, place, and time.  Skin: Skin is warm and dry.  Psychiatric: She has a normal mood and affect. Her behavior is normal.      A/P:  1. Essential hypertension - well-controlled Refill: - spironolactone (ALDACTONE) 25 MG tablet; Take 1 tablet (25 mg total) by mouth daily.  Dispense: 90 tablet; Refill: 3 - metoprolol tartrate (LOPRESSOR) 100 MG tablet; Take 1 tablet (100 mg total) by mouth 2 (two) times daily.  Dispense: 180 tablet; Refill: 3 - lisinopril (PRINIVIL,ZESTRIL) 10 MG tablet; Take 1 tablet (10 mg total) by mouth daily.  Dispense: 90 tablet; Refill: 3 - f/u in 6 mo or PRN  2. Anxiety - stable, well-controlled Refill: - ALPRAZolam (XANAX) 0.25 MG tablet; Take 1 tablet (0.25 mg total) by mouth 2 (two) times daily as needed for anxiety.  Dispense: 30 tablet; Refill: 2  3. Encounter to establish care with new doctor - UTD on CPE, mammo, PAP, labs, immunizations  4. BMI 34.0-34.9,adult - cont with regular exercise and healthy diet program

## 2018-10-06 ENCOUNTER — Encounter: Payer: Self-pay | Admitting: Family Medicine

## 2018-10-29 ENCOUNTER — Ambulatory Visit: Payer: Self-pay | Admitting: *Deleted

## 2018-10-29 NOTE — Telephone Encounter (Signed)
Rescheduled with Dr. Raeford Razor on 11/01/18-Patient aware to seek medical attention if pain worsens.

## 2018-10-29 NOTE — Telephone Encounter (Signed)
Pt called with having twisted her left ankle. The next day the ankle felt fine but she had stiffness in the left knee. She stated that it feels tight. She is walking with a limp. The left leg is slightly larger than the right one.  She takes a yoga class and it was hard to move. She has used ice and heat without relief. She would like an appointment for today but no availability until Monday. Appointment scheduled per protocol. Advised to call back and go to ED if symptoms progress. Pt voiced understanding. Routing to flow at [facility].  Reason for Disposition . [1] After 3 days AND [2] pain not improved  Answer Assessment - Initial Assessment Questions 1. MECHANISM: "How did the injury happen?" (e.g., twisting injury, direct blow)      Walking in tennis shoes and twisted ankle over 2. ONSET: "When did the injury happen?" (Minutes or hours ago)      Tuesday 3. LOCATION: "Where is the injury located?"      Ankle but feeling tightness in her knee 4. APPEARANCE of INJURY: "What does the injury look like?"       No outside injury 5. WEIGHT-BEARING: "Can you put weight on that foot?" "Can you walk (four steps or more)?"       Harder to stand on the left leg 6. SIZE: For cuts, bruises, or swelling, ask: "How large is it?" (e.g., inches or centimeters;  entire joint)      Slightly swollen left leg 7. PAIN: "Is there pain?" If so, ask: "How bad is the pain?"    (e.g., Scale 1-10; or mild, moderate, severe)     Tightness #8 8. TETANUS: For any breaks in the skin, ask: "When was the last tetanus booster?"     No breaks 9. OTHER SYMPTOMS: "Do you have any other symptoms?"      no 10. PREGNANCY: "Is there any chance you are pregnant?" "When was your last menstrual period?"       No has hysterectomy  5years ago  Protocols used: [place]

## 2018-11-01 ENCOUNTER — Ambulatory Visit: Payer: 59 | Admitting: Nurse Practitioner

## 2018-11-01 ENCOUNTER — Encounter: Payer: Self-pay | Admitting: Family Medicine

## 2018-11-01 ENCOUNTER — Ambulatory Visit (INDEPENDENT_AMBULATORY_CARE_PROVIDER_SITE_OTHER): Payer: 59 | Admitting: Family Medicine

## 2018-11-01 VITALS — BP 122/80 | HR 69 | Temp 97.8°F | Ht 63.0 in

## 2018-11-01 DIAGNOSIS — M25562 Pain in left knee: Secondary | ICD-10-CM | POA: Diagnosis not present

## 2018-11-01 MED ORDER — IBUPROFEN-FAMOTIDINE 800-26.6 MG PO TABS: 1.0000 | ORAL_TABLET | Freq: Three times a day (TID) | ORAL | 3 refills | Status: DC

## 2018-11-01 NOTE — Assessment & Plan Note (Signed)
Symptoms seem suggestive of patellofemoral syndrome.  Reports she has been told of having some degenerative changes in the left knee.   -Duexis. -Counseled on home exercise therapy and supportive care. -If no improvement can consider imaging, injection, physical therapy.

## 2018-11-01 NOTE — Patient Instructions (Signed)
Nice to meet you  Please try the duexis for 7 days straight and then as needed  Please try heat or ice on the knee. 20 minutes at a time for 3-4 times per day  Please to avoiding for sitting for prolonged period of times. Avoid sitting for longer than 30 minutes at a time  Please see me back in 3-4 weeks if no better.

## 2018-11-01 NOTE — Progress Notes (Signed)
[name] - 47 y.o. female MRN [id number]  Date of birth: 17-Mar-1971  SUBJECTIVE:  Including CC & ROS.  Chief Complaint  Patient presents with  . Knee Pain    left ankle/knee pain.     [name] is a 47 y.o. female that is presenting with left knee pain.  The pain is started after she turned her ankle.  The pain is localized to the knee.  She denies any mechanical symptoms.  Has not had pain similar to this.  The pain is sharp it is aggravated by going up and down stairs.  She is taken over-the-counter medications with limited improvement.  She rolled her ankle about 3 weeks ago and started feeling this pain a week or so ago.  She feels like a squeezing sensation around the anterior aspect of the knee.  Denies any history of surgery of the knee.    Review of Systems  Constitutional: Negative for fever.  HENT: Negative for congestion.   Respiratory: Negative for cough.   Cardiovascular: Negative for chest pain.  Gastrointestinal: Negative for abdominal pain.  Musculoskeletal: Negative for joint swelling.  Skin: Negative for color change.  Neurological: Negative for weakness.  Hematological: Negative for adenopathy.  Psychiatric/Behavioral: Negative for agitation.    HISTORY: Past Medical, Surgical, Social, and Family History Reviewed & Updated per EMR.   Pertinent Historical Findings include:  Past Medical History:  Diagnosis Date  . Allergy   . Asthma   . GERD (gastroesophageal reflux disease)    no meds  . H/O: myomectomy   . Hearing impaired    can hear on left side only  . Heart murmur   . Herpes genitalia   . Hypertension    pulmonary  . Refusal of blood transfusions as patient is Jehovah's Witness   . TIA (transient ischemic attack)     Past Surgical History:  Procedure Laterality Date  . BREAST LUMPECTOMY  01/06/11  . CYSTOSCOPY  05/19/2012   Procedure: CYSTOSCOPY;  Surgeon: Alwyn Pea, MD;  Location: [facility];  Service: Gynecology;  Laterality:  N/A;  . MYOMECTOMY  2004     2010 robotic  . OVARIAN CYST REMOVAL  2010   x2  . ROBOTIC ASSISTED LAP VAGINAL HYSTERECTOMY    . ROBOTIC ASSISTED LAPAROSCOPIC OVARIAN CYSTECTOMY Right 03/20/2015   Procedure: ROBOTIC ASSISTED LAPAROSCOPIC Salpingectomy OVARIAN CYSTECTOMY, pelvic washings, excision of peritoneal pseudocyst.;  Surgeon: Delsa Bern, MD;  Location: [facility];  Service: Gynecology;  Laterality: Right;    No Known Allergies  Family History  Problem Relation Age of Onset  . Asthma Mother   . Arthritis Mother   . Hypertension Father   . Heart disease Maternal Grandmother        PCI  . Cancer Maternal Grandmother 66       breast  . Cancer Other 60       breast and possible colon  . Migraines Sister   . Diabetes Brother   . Heart attack Paternal Grandfather        before age 21     Social History   Socioeconomic History  . Marital status: Single    Spouse name: Not on file  . Number of children: Not on file  . Years of education: Not on file  . Highest education level: Not on file  Occupational History  . Not on file  Social Needs  . Financial resource strain: Not on file  . Food insecurity:  Worry: Not on file    Inability: Not on file  . Transportation needs:    Medical: Not on file    Non-medical: Not on file  Tobacco Use  . Smoking status: Never Smoker  . Smokeless tobacco: Never Used  Substance and Sexual Activity  . Alcohol use: No    Alcohol/week: 0.0 standard drinks  . Drug use: No  . Sexual activity: Yes    Partners: Male    Birth control/protection: Surgical    Comment: hyst  Lifestyle  . Physical activity:    Days per week: Not on file    Minutes per session: Not on file  . Stress: Not on file  Relationships  . Social connections:    Talks on phone: Not on file    Gets together: Not on file    Attends religious service: Not on file    Active member of club or organization: Not on file    Attends meetings of clubs or organizations:  Not on file    Relationship status: Not on file  . Intimate partner violence:    Fear of current or ex partner: Not on file    Emotionally abused: Not on file    Physically abused: Not on file    Forced sexual activity: Not on file  Other Topics Concern  . Not on file  Social History Narrative   Exercise---walk, zumba, yoga, pole dancing class     PHYSICAL EXAM:  VS: BP 122/80 (BP Location: Left Arm, Patient Position: Sitting, Cuff Size: Normal)   Pulse 69   Temp 97.8 F (36.6 C) (Oral)   Ht 5\' 3"  (1.6 m)   LMP 04/18/2012   SpO2 97%   BMI 34.76 kg/m  Physical Exam Gen: NAD, alert, cooperative with exam, well-appearing ENT: normal lips, normal nasal mucosa,  Eye: normal EOM, normal conjunctiva and lids CV:  no edema, +2 pedal pulses   Resp: no accessory muscle use, non-labored,  Skin: no rashes, no areas of induration  Neuro: normal tone, normal sensation to touch Psych:  normal insight, alert and oriented MSK:  Left knee: Normal to inspection with no erythema or effusion or obvious bony abnormalities. Palpation normal with no warmth, patellar tenderness, or condyle tenderness. Mild tenderness to palpation over the medial lateral joint line ROM full in flexion and extension and lower leg rotation. Ligaments with solid consistent endpoints including LCL, MCL. Negative Mcmurray's tests. Non painful patellar compression. Patellar glide without crepitus. Patellar and quadriceps tendons unremarkable. Hamstring and quadriceps strength is normal.  Neurovascularly intact     ASSESSMENT & PLAN:   Acute pain of left knee Symptoms seem suggestive of patellofemoral syndrome.  Reports she has been told of having some degenerative changes in the left knee.   -Duexis. -Counseled on home exercise therapy and supportive care. -If no improvement can consider imaging, injection, physical therapy.

## 2018-11-29 ENCOUNTER — Ambulatory Visit: Payer: Self-pay

## 2018-11-29 ENCOUNTER — Encounter: Payer: Self-pay | Admitting: Family Medicine

## 2018-11-29 NOTE — Telephone Encounter (Signed)
[name] with [place] authorization dept called for clarification and the diagnosis information for the Rx for Ibuprofen-Famotidine 800-26.6 MG TABS. [name] requests call back for prior authorization. Called pharmacy back and spoke with [place]. Pt was dx with patellofemoral syndrome. Pt has tried OTC medication but per note "limited improvement."  Reason for Disposition . General information question, no triage required and triager able to answer question  Protocols used: INFORMATION ONLY CALL-A-AH

## 2019-01-29 ENCOUNTER — Encounter: Payer: Self-pay | Admitting: Family Medicine

## 2019-01-29 DIAGNOSIS — F419 Anxiety disorder, unspecified: Secondary | ICD-10-CM

## 2019-01-31 ENCOUNTER — Other Ambulatory Visit: Payer: Self-pay

## 2019-01-31 DIAGNOSIS — I10 Essential (primary) hypertension: Secondary | ICD-10-CM

## 2019-01-31 MED ORDER — SPIRONOLACTONE 25 MG PO TABS: 25.0000 mg | ORAL_TABLET | Freq: Every day | ORAL | 0 refills | Status: DC

## 2019-01-31 MED ORDER — METOPROLOL TARTRATE 100 MG PO TABS: 100.0000 mg | ORAL_TABLET | Freq: Two times a day (BID) | ORAL | 0 refills | Status: DC

## 2019-01-31 MED ORDER — LISINOPRIL 10 MG PO TABS: 10.0000 mg | ORAL_TABLET | Freq: Every day | ORAL | 0 refills | Status: DC

## 2019-02-01 ENCOUNTER — Telehealth: Payer: Self-pay | Admitting: Family Medicine

## 2019-02-01 ENCOUNTER — Other Ambulatory Visit: Payer: Self-pay

## 2019-02-01 DIAGNOSIS — F419 Anxiety disorder, unspecified: Secondary | ICD-10-CM

## 2019-02-01 MED ORDER — ALPRAZOLAM 0.25 MG PO TABS: 0.2500 mg | ORAL_TABLET | Freq: Two times a day (BID) | ORAL | 0 refills | Status: DC | PRN

## 2019-02-01 NOTE — Telephone Encounter (Signed)
Patient sent message that she wants to set up an appointment for annual physical but she is not do until after 03/16/19. She also added comment about high blood pressure, Not sure if she want to go ahead and come in for that and come later for the CPE. Tried to call patient but had to leave a message.

## 2019-02-02 MED ORDER — ALPRAZOLAM 0.25 MG PO TABS: 0.2500 mg | ORAL_TABLET | Freq: Two times a day (BID) | ORAL | 0 refills | Status: DC | PRN

## 2019-02-10 ENCOUNTER — Encounter: Payer: Self-pay | Admitting: Family Medicine

## 2019-02-10 ENCOUNTER — Other Ambulatory Visit: Payer: Self-pay

## 2019-02-10 DIAGNOSIS — J4520 Mild intermittent asthma, uncomplicated: Secondary | ICD-10-CM

## 2019-02-10 MED ORDER — ALBUTEROL SULFATE HFA 108 (90 BASE) MCG/ACT IN AERS: 2.0000 | INHALATION_SPRAY | Freq: Four times a day (QID) | RESPIRATORY_TRACT | 2 refills | Status: DC | PRN

## 2019-02-17 ENCOUNTER — Encounter: Payer: 59 | Admitting: Family Medicine

## 2019-03-01 ENCOUNTER — Telehealth: Payer: 59 | Admitting: Nurse Practitioner

## 2019-03-01 DIAGNOSIS — R6889 Other general symptoms and signs: Principal | ICD-10-CM

## 2019-03-01 DIAGNOSIS — Z20822 Contact with and (suspected) exposure to covid-19: Secondary | ICD-10-CM

## 2019-03-01 MED ORDER — BENZONATATE 100 MG PO CAPS: 100.0000 mg | ORAL_CAPSULE | Freq: Three times a day (TID) | ORAL | 0 refills | Status: DC | PRN

## 2019-03-01 NOTE — Progress Notes (Signed)
E-Visit for Corona Virus Screening  Based on your current symptoms, you may very well have the virus, however your symptoms are mild. Currently, not all patients are being tested. If the symptoms are mild and there is not a known exposure, performing the test is not indicated.  Coronavirus disease 2019 (COVID-19) is a respiratory illness that can spread from person to person. The virus that causes COVID-19 is a new virus that was first identified in the country of [place] but is now found in multiple other countries and has spread to the [place].  Symptoms associated with the virus are mild to severe fever, cough, and shortness of breath. There is currently no vaccine to protect against COVID-19, and there is no specific antiviral treatment for the virus.   To be considered HIGH RISK for Coronavirus (COVID-19), you have to meet the following criteria:  . Traveled to [place], [place], [place], [place] or [place]; or in the [place] to [place], [place], [place], or [place]; and have fever, cough, and shortness of breath within the last 2 weeks of travel OR  . Been in close contact with a person diagnosed with COVID-19 within the last 2 weeks and have fever, cough, and shortness of breath  . IF YOU DO NOT MEET THESE CRITERIA, YOU ARE CONSIDERED LOW RISK FOR COVID-19.   It is vitally important that if you feel that you have an infection such as this virus or any other virus that you stay home and away from places where you may spread it to others.  You should self-quarantine for 14 days if you have symptoms that could potentially be coronavirus and avoid contact with people age 45 and older.   You can use medication such as A prescription cough medication called Tessalon Perles 100 mg. You may take 1-2 capsules every 8 hours as needed for cough  You may also take acetaminophen (Tylenol) as needed for fever.   Reduce your risk of any infection by using the same precautions used for  avoiding the common cold or flu:  Marland Kitchen Wash your hands often with soap and warm water for at least 20 seconds.  If soap and water are not readily available, use an alcohol-based hand sanitizer with at least 60% alcohol.  . If coughing or sneezing, cover your mouth and nose by coughing or sneezing into the elbow areas of your shirt or coat, into a tissue or into your sleeve (not your hands). . Avoid shaking hands with others and consider head nods or verbal greetings only. . Avoid touching your eyes, nose, or mouth with unwashed hands.  . Avoid close contact with people who are sick. . Avoid places or events with large numbers of people in one location, like concerts or sporting events. . Carefully consider travel plans you have or are making. . If you are planning any travel outside or inside the [place], visit the CDC's Travelers' Health webpage for the latest health notices. . If you have some symptoms but not all symptoms, continue to monitor at home and seek medical attention if your symptoms worsen. . If you are having a medical emergency, call 911.  HOME CARE . Only take medications as instructed by your medical team. . Drink plenty of fluids and get plenty of rest. . A steam or ultrasonic humidifier can help if you have congestion.   GET HELP RIGHT AWAY IF: . You develop worsening fever. . You become short of breath . You cough  up blood. . Your symptoms become more severe MAKE SURE YOU   Understand these instructions.  Will watch your condition.  Will get help right away if you are not doing well or get worse.  Your e-visit answers were reviewed by a board certified advanced clinical practitioner to complete your personal care plan.  Depending on the condition, your plan could have included both over the counter or prescription medications.  If there is a problem please reply once you have received a response from your provider. Your safety is important to [place].  If you have drug  allergies check your prescription carefully.    You can use MyChart to ask questions about today's visit, request a non-urgent call back, or ask for a work or school excuse for 24 hours related to this e-Visit. If it has been greater than 24 hours you will need to follow up with your provider, or enter a new e-Visit to address those concerns. You will get an e-mail in the next two days asking about your experience.  I hope that your e-visit has been valuable and will speed your recovery. Thank you for using e-visits.  5 minutes spent reviewing and documenting in chart.

## 2019-03-01 NOTE — Progress Notes (Signed)
E-Visit for Corona Virus Screening  Based on your current symptoms, you may very well have the virus, however your symptoms are mild. Currently, not all patients are being tested. If the symptoms are mild and there is not a known exposure, performing the test is not indicated.  Coronavirus disease 2019 (COVID-19) is a respiratory illness that can spread from person to person. The virus that causes COVID-19 is a new virus that was first identified in the country of [place] but is now found in multiple other countries and has spread to the [place].  Symptoms associated with the virus are mild to severe fever, cough, and shortness of breath. There is currently no vaccine to protect against COVID-19, and there is no specific antiviral treatment for the virus.   To be considered HIGH RISK for Coronavirus (COVID-19), you have to meet the following criteria:  . Traveled to [place], [place], [place], [place] or [place]; or in the [place] to [place], [place], [place], or [place]; and have fever, cough, and shortness of breath within the last 2 weeks of travel OR  . Been in close contact with a person diagnosed with COVID-19 within the last 2 weeks and have fever, cough, and shortness of breath  . IF YOU DO NOT MEET THESE CRITERIA, YOU ARE CONSIDERED LOW RISK FOR COVID-19.   It is vitally important that if you feel that you have an infection such as this virus or any other virus that you stay home and away from places where you may spread it to others.  You should self-quarantine for 14 days if you have symptoms that could potentially be coronavirus and avoid contact with people age 35 and older.   You can use medication such as A prescription cough medication called Tessalon Perles 100 mg. You may take 1-2 capsules every 8 hours as needed for cough  You may also take acetaminophen (Tylenol) as needed for fever.   Reduce your risk of any infection by using the same precautions used for  avoiding the common cold or flu:  Marland Kitchen Wash your hands often with soap and warm water for at least 20 seconds.  If soap and water are not readily available, use an alcohol-based hand sanitizer with at least 60% alcohol.  . If coughing or sneezing, cover your mouth and nose by coughing or sneezing into the elbow areas of your shirt or coat, into a tissue or into your sleeve (not your hands). . Avoid shaking hands with others and consider head nods or verbal greetings only. . Avoid touching your eyes, nose, or mouth with unwashed hands.  . Avoid close contact with people who are sick. . Avoid places or events with large numbers of people in one location, like concerts or sporting events. . Carefully consider travel plans you have or are making. . If you are planning any travel outside or inside the [place], visit the CDC's Travelers' Health webpage for the latest health notices. . If you have some symptoms but not all symptoms, continue to monitor at home and seek medical attention if your symptoms worsen. . If you are having a medical emergency, call 911.  HOME CARE . Only take medications as instructed by your medical team. . Drink plenty of fluids and get plenty of rest. . A steam or ultrasonic humidifier can help if you have congestion.   GET HELP RIGHT AWAY IF: . You develop worsening fever. . You become short of breath . You cough  up blood. . Your symptoms become more severe MAKE SURE YOU   Understand these instructions.  Will watch your condition.  Will get help right away if you are not doing well or get worse.  Your e-visit answers were reviewed by a board certified advanced clinical practitioner to complete your personal care plan.  Depending on the condition, your plan could have included both over the counter or prescription medications.  If there is a problem please reply once you have received a response from your provider. Your safety is important to [place].  If you have drug  allergies check your prescription carefully.    You can use MyChart to ask questions about today's visit, request a non-urgent call back, or ask for a work or school excuse for 24 hours related to this e-Visit. If it has been greater than 24 hours you will need to follow up with your provider, or enter a new e-Visit to address those concerns. You will get an e-mail in the next two days asking about your experience.  I hope that your e-visit has been valuable and will speed your recovery. Thank you for using e-visits.  5 minutes spent reviewing and documenting in chart.

## 2019-04-26 ENCOUNTER — Other Ambulatory Visit: Payer: Self-pay | Admitting: Family Medicine

## 2019-04-26 DIAGNOSIS — I10 Essential (primary) hypertension: Secondary | ICD-10-CM

## 2019-05-18 ENCOUNTER — Encounter: Payer: 59 | Admitting: Family Medicine

## 2019-05-20 ENCOUNTER — Ambulatory Visit (INDEPENDENT_AMBULATORY_CARE_PROVIDER_SITE_OTHER): Payer: 59 | Admitting: Family Medicine

## 2019-05-20 ENCOUNTER — Encounter: Payer: Self-pay | Admitting: Family Medicine

## 2019-05-20 VITALS — BP 130/80 | HR 72 | Temp 97.7°F | Ht 63.0 in | Wt 212.2 lb

## 2019-05-20 DIAGNOSIS — J4520 Mild intermittent asthma, uncomplicated: Secondary | ICD-10-CM | POA: Diagnosis not present

## 2019-05-20 DIAGNOSIS — Z0000 Encounter for general adult medical examination without abnormal findings: Secondary | ICD-10-CM

## 2019-05-20 DIAGNOSIS — I10 Essential (primary) hypertension: Secondary | ICD-10-CM

## 2019-05-20 NOTE — Progress Notes (Signed)
[name] is a 48 y.o. female  Chief Complaint  Patient presents with  . Annual Exam    CPE/ not fasting ( had protein shake)    HPI: [name] is a 48 y.o. female here for CPE, labs. She is not fasting.  She states she feels tired but otherwise no concerns or complaints.  She has been working home for months. She is traveling to [place] next month to visit her boyfriend who lives there.   Last PAP: s/p TAH Last mammo: 07/2018 Immunizations: UTD  Diet/Exercise: pt has been exercising (zoom videos) and diet has not been good. Pt purchased manual treadmill and is just starting to use it  Med refills needed today? none   Past Medical History:  Diagnosis Date  . Allergy   . Asthma   . GERD (gastroesophageal reflux disease)    no meds  . H/O: myomectomy   . Hearing impaired    can hear on left side only  . Heart murmur   . Herpes genitalia   . Hypertension    pulmonary  . Refusal of blood transfusions as patient is Jehovah's Witness   . TIA (transient ischemic attack)     Past Surgical History:  Procedure Laterality Date  . BREAST LUMPECTOMY  01/06/11  . CYSTOSCOPY  05/19/2012   Procedure: CYSTOSCOPY;  Surgeon: Alwyn Pea, MD;  Location: [facility];  Service: Gynecology;  Laterality: N/A;  . MYOMECTOMY  2004     2010 robotic  . OVARIAN CYST REMOVAL  2010   x2  . ROBOTIC ASSISTED LAP VAGINAL HYSTERECTOMY    . ROBOTIC ASSISTED LAPAROSCOPIC OVARIAN CYSTECTOMY Right 03/20/2015   Procedure: ROBOTIC ASSISTED LAPAROSCOPIC Salpingectomy OVARIAN CYSTECTOMY, pelvic washings, excision of peritoneal pseudocyst.;  Surgeon: Delsa Bern, MD;  Location: [facility];  Service: Gynecology;  Laterality: Right;    Social History   Socioeconomic History  . Marital status: Single    Spouse name: Not on file  . Number of children: Not on file  . Years of education: Not on file  . Highest education level: Not on file  Occupational History  . Not on file  Social Needs  .  Financial resource strain: Not on file  . Food insecurity    Worry: Not on file    Inability: Not on file  . Transportation needs    Medical: Not on file    Non-medical: Not on file  Tobacco Use  . Smoking status: Never Smoker  . Smokeless tobacco: Never Used  Substance and Sexual Activity  . Alcohol use: No    Alcohol/week: 0.0 standard drinks  . Drug use: No  . Sexual activity: Yes    Partners: Male    Birth control/protection: Surgical    Comment: hyst  Lifestyle  . Physical activity    Days per week: Not on file    Minutes per session: Not on file  . Stress: Not on file  Relationships  . Social Herbalist on phone: Not on file    Gets together: Not on file    Attends religious service: Not on file    Active member of club or organization: Not on file    Attends meetings of clubs or organizations: Not on file    Relationship status: Not on file  . Intimate partner violence    Fear of current or ex partner: Not on file    Emotionally abused: Not on file    Physically  abused: Not on file    Forced sexual activity: Not on file  Other Topics Concern  . Not on file  Social History Narrative   Exercise---walk, zumba, yoga, pole dancing class    Family History  Problem Relation Age of Onset  . Asthma Mother   . Arthritis Mother   . Hypertension Father   . Heart disease Maternal Grandmother        PCI  . Cancer Maternal Grandmother 62       breast  . Cancer Other 60       breast and possible colon  . Migraines Sister   . Diabetes Brother   . Heart attack Paternal Grandfather        before age 41     Immunization History  Administered Date(s) Administered  . Influenza Split 12/25/2012, 09/09/2017  . Influenza Whole 08/29/2009, 07/30/2011, 08/31/2012  . Influenza,inj,Quad PF,6+ Mos 08/24/2013  . Influenza-Unspecified 08/24/2014, 09/12/2015, 09/25/2016  . Pneumococcal Polysaccharide-23 10/08/2010  . Td 01/05/2008  . Tdap 03/15/2018    Outpatient  Encounter Medications as of 05/20/2019  Medication Sig Note  . albuterol (PROVENTIL HFA;VENTOLIN HFA) 108 (90 Base) MCG/ACT inhaler Inhale 2 puffs into the lungs every 6 (six) hours as needed for wheezing.   Marland Kitchen ALPRAZolam (XANAX) 0.25 MG tablet Take 1 tablet (0.25 mg total) by mouth 2 (two) times daily as needed for anxiety.   . Ascorbic Acid (VITAMIN C) 1000 MG tablet Take 1,000 mg by mouth daily.   Marland Kitchen aspirin 325 MG tablet Take 1 tablet (325 mg total) by mouth daily.   . cetirizine (ZYRTEC) 10 MG tablet Take 10 mg by mouth daily.   . cholecalciferol (VITAMIN D) 1000 UNITS tablet Take 1,000 Units by mouth daily.   . Cholecalciferol (VITAMIN D3) 50 MCG (2000 UT) capsule 1 capsule.   Marland Kitchen estradiol (VIVELLE-DOT) 0.0375 MG/24HR Place 1 patch onto the skin 2 (two) times a week. 10/21/2016: Received from: External Pharmacy  . fluticasone (FLONASE) 50 MCG/ACT nasal spray Place 2 sprays into both nostrils daily.   Marland Kitchen ibuprofen (ADVIL,MOTRIN) 600 MG tablet TAKE 1 TABLET BY MOUTH EVERY 6 HOURS DO NOT EXCEED 3200MG  PER DAY   . Ibuprofen-Famotidine 800-26.6 MG TABS Take 1 tablet by mouth 3 (three) times daily.   Marland Kitchen lisinopril (ZESTRIL) 10 MG tablet TAKE 1 TABLET BY MOUTH EVERY DAY   . metoprolol tartrate (LOPRESSOR) 100 MG tablet TAKE 1 TABLET BY MOUTH TWICE A DAY   . Multiple Vitamins-Calcium (ONE-A-DAY WOMENS PO) Take 1 tablet by mouth daily.   Marland Kitchen spironolactone (ALDACTONE) 25 MG tablet TAKE 1 TABLET BY MOUTH EVERY DAY   . valACYclovir (VALTREX) 500 MG tablet Take 500 mg by mouth daily.  03/07/2015: .  . benzonatate (TESSALON PERLES) 100 MG capsule Take 1 capsule (100 mg total) by mouth 3 (three) times daily as needed. (Patient not taking: Reported on 05/20/2019)   . [DISCONTINUED] chlorhexidine (PERIDEX) 0.12 % solution RINSE WITH 15ML (1 CAPFUL) FOR 30 SECONDS AND SPIT USE TWICE DAILY IN MORNING AND EVENING AFTER BRUSHING . EXPECTORATE AFTER RINSING. DO NOT   . [DISCONTINUED] doxycycline (VIBRAMYCIN) 100 MG  capsule TAKE 1 CAPSULE BY MOUTH TWICE DAILY UNTIL GONE. START AFTER COMPLETION OF OTHER ANTIBIOTIC   . [DISCONTINUED] HYDROcodone-acetaminophen (NORCO/VICODIN) 5-325 MG tablet Take 1 tablet by mouth every 6 (six) hours as needed. for pain   . [DISCONTINUED] oxyCODONE-acetaminophen (PERCOCET/ROXICET) 5-325 MG tablet oxycodone-acetaminophen 5 mg-325 mg tablet   . [DISCONTINUED] penicillin v potassium (VEETID) 500 MG  tablet TAKE 1 TABLET BY MOUTH 4 TIMES DAILY UNTIL GONE    No facility-administered encounter medications on file as of 05/20/2019.      ROS: Gen: no fever, chills  Skin: no rash, itching ENT: no ear pain, ear drainage, nasal congestion, rhinorrhea, sinus pressure, sore throat Eyes: no blurry vision, double vision Resp: no cough, wheeze,SOB Breast: no breast tenderness, no nipple discharge, no breast masses CV: no CP, palpitations, LE edema,  GI: no heartburn, n/v/d/c, abd pain GU: no dysuria, urgency, frequency, hematuria; no vaginal itching, odor, discharge MSK: no joint pain, myalgias, back pain Neuro: no dizziness, headache, weakness, vertigo Psych: no depression, anxiety, insomnia   No Known Allergies  BP 130/80   Pulse 72   Temp 97.7 F (36.5 C) (Oral)   Ht 5\' 3"  (1.6 m)   Wt 212 lb 3.2 oz (96.3 kg)   LMP 04/18/2012   SpO2 98%   BMI 37.59 kg/m    BP Readings from Last 3 Encounters:  05/20/19 130/80  11/01/18 122/80  09/22/18 132/88   Wt Readings from Last 3 Encounters:  05/20/19 212 lb 3.2 oz (96.3 kg)  09/22/18 196 lb 3.2 oz (89 kg)  03/15/18 199 lb 9.6 oz (90.5 kg)   Pulse Readings from Last 3 Encounters:  05/20/19 72  11/01/18 69  09/22/18 81     Physical Exam  Constitutional: She is oriented to person, place, and time. She appears well-developed and well-nourished. No distress.  HENT:  Head: Normocephalic and atraumatic.  Right Ear: Tympanic membrane and ear canal normal.  Left Ear: Tympanic membrane and ear canal normal.  Nose: Nose  normal.  Mouth/Throat: Oropharynx is clear and moist and mucous membranes are normal.  Eyes: Pupils are equal, round, and reactive to light. Conjunctivae are normal.  Neck: Neck supple. No thyromegaly present.  Cardiovascular: Normal rate, regular rhythm, normal heart sounds and intact distal pulses.  No murmur heard. Pulmonary/Chest: Effort normal and breath sounds normal. No respiratory distress. She has no wheezes. She has no rhonchi. Right breast exhibits no mass, no nipple discharge, no skin change and no tenderness. Left breast exhibits no mass, no nipple discharge, no skin change and no tenderness.  Abdominal: Soft. Bowel sounds are normal. She exhibits no distension and no mass. There is no abdominal tenderness.  Musculoskeletal:        General: No edema.  Lymphadenopathy:    She has no cervical adenopathy.  Neurological: She is alert and oriented to person, place, and time. She exhibits normal muscle tone. Coordination normal.  Skin: Skin is warm and dry.  Psychiatric: She has a normal mood and affect. Her behavior is normal.     A/P:  1. Annual physical exam - UTD on mammo, s/p TAH so does not need PAP - UTD on immunizations - cont with regular CV exercise and work to improve diet - due for vision exam, UTD on dental exam - ALT - AST - Basic Metabolic Panel (BMET) - Lipid panel - VITAMIN D 25 Hydroxy (Vit-D Deficiency, Fractures) - next CPE in 1 year  2. Essential hypertension - well-controlled, at goal - cont current med  3. Mild intermittent asthma without complication - well-controlled, at goal - no recent exacerbations

## 2019-05-20 NOTE — Patient Instructions (Signed)

## 2019-05-21 LAB — BASIC METABOLIC PANEL
BUN/Creatinine Ratio: 10 (ref 9–23)
BUN: 9 mg/dL (ref 6–24)
CO2: 23 mmol/L (ref 20–29)
Calcium: 9.9 mg/dL (ref 8.7–10.2)
Chloride: 103 mmol/L (ref 96–106)
Creatinine, Ser: 0.88 mg/dL (ref 0.57–1.00)
GFR calc Af Amer: 90 mL/min/{1.73_m2} (ref >59)
GFR calc non Af Amer: 78 mL/min/{1.73_m2} (ref >59)
Glucose: 75 mg/dL (ref 65–99)
Potassium: 4.2 mmol/L (ref 3.5–5.2)
Sodium: 139 mmol/L (ref 134–144)

## 2019-05-21 LAB — VITAMIN D 25 HYDROXY (VIT D DEFICIENCY, FRACTURES): Vit D, 25-Hydroxy: 36.8 ng/mL (ref 30.0–100.0)

## 2019-05-21 LAB — LIPID PANEL
Chol/HDL Ratio: 2.7 ratio (ref 0.0–4.4)
Cholesterol, Total: 147 mg/dL (ref 100–199)
HDL: 54 mg/dL (ref >39)
LDL Calculated: 73 mg/dL (ref 0–99)
Triglycerides: 98 mg/dL (ref 0–149)
VLDL Cholesterol Cal: 20 mg/dL (ref 5–40)

## 2019-05-21 LAB — ALT: ALT: 17 IU/L (ref 0–32)

## 2019-05-21 LAB — AST: AST: 16 IU/L (ref 0–40)

## 2019-05-23 ENCOUNTER — Other Ambulatory Visit: Payer: Self-pay | Admitting: Family Medicine

## 2019-05-23 DIAGNOSIS — F419 Anxiety disorder, unspecified: Secondary | ICD-10-CM

## 2019-05-24 MED ORDER — ALPRAZOLAM 0.25 MG PO TABS: 0.2500 mg | ORAL_TABLET | Freq: Two times a day (BID) | ORAL | 0 refills | Status: DC | PRN

## 2019-05-24 NOTE — Telephone Encounter (Signed)
[place] please help, Dr. Loletha Grayer is out all week this week.  Last CPE 05/20/2019. Last refills 02/02/2019 for 60 tab BID PRN PMP checked last 3 pick up at [facility]       10/03/2018--30 tab BID PRN       11/26/2018--30 tab BID PRN       02/02/2019--60 tab BID PRN

## 2019-06-01 ENCOUNTER — Encounter: Payer: Self-pay | Admitting: Family Medicine

## 2019-06-01 DIAGNOSIS — F419 Anxiety disorder, unspecified: Secondary | ICD-10-CM

## 2019-06-03 MED ORDER — ALPRAZOLAM 0.25 MG PO TABS: 0.2500 mg | ORAL_TABLET | Freq: Two times a day (BID) | ORAL | 0 refills | Status: DC | PRN

## 2019-07-26 ENCOUNTER — Other Ambulatory Visit: Payer: Self-pay | Admitting: Family Medicine

## 2019-07-26 DIAGNOSIS — I10 Essential (primary) hypertension: Secondary | ICD-10-CM

## 2019-08-11 LAB — HM PAP SMEAR: HM Pap smear: NORMAL

## 2019-08-11 LAB — RESULTS CONSOLE HPV: CHL HPV: NEGATIVE

## 2019-08-17 ENCOUNTER — Emergency Department (HOSPITAL_BASED_OUTPATIENT_CLINIC_OR_DEPARTMENT_OTHER)
Admission: EM | Admit: 2019-08-17 | Discharge: 2019-08-17 | Disposition: A | Discharge status: Home / Self Care | Payer: 59 | Attending: Emergency Medicine | Admitting: Emergency Medicine

## 2019-08-17 ENCOUNTER — Encounter (HOSPITAL_BASED_OUTPATIENT_CLINIC_OR_DEPARTMENT_OTHER): Payer: Self-pay | Admitting: Emergency Medicine

## 2019-08-17 ENCOUNTER — Other Ambulatory Visit: Payer: Self-pay

## 2019-08-17 ENCOUNTER — Ambulatory Visit: Payer: Self-pay

## 2019-08-17 ENCOUNTER — Emergency Department (HOSPITAL_BASED_OUTPATIENT_CLINIC_OR_DEPARTMENT_OTHER): Payer: 59

## 2019-08-17 DIAGNOSIS — I10 Essential (primary) hypertension: Secondary | ICD-10-CM | POA: Insufficient documentation

## 2019-08-17 DIAGNOSIS — R51 Headache: Secondary | ICD-10-CM | POA: Diagnosis not present

## 2019-08-17 DIAGNOSIS — R079 Chest pain, unspecified: Secondary | ICD-10-CM | POA: Insufficient documentation

## 2019-08-17 DIAGNOSIS — Z7982 Long term (current) use of aspirin: Secondary | ICD-10-CM | POA: Insufficient documentation

## 2019-08-17 DIAGNOSIS — J45909 Unspecified asthma, uncomplicated: Secondary | ICD-10-CM | POA: Insufficient documentation

## 2019-08-17 DIAGNOSIS — Z79899 Other long term (current) drug therapy: Secondary | ICD-10-CM | POA: Diagnosis not present

## 2019-08-17 LAB — TROPONIN I (HIGH SENSITIVITY)
Troponin I (High Sensitivity): 5 ng/L (ref <18)
Troponin I (High Sensitivity): 5 ng/L (ref <18)

## 2019-08-17 LAB — BASIC METABOLIC PANEL
Anion gap: 12 (ref 5–15)
BUN: 13 mg/dL (ref 6–20)
CO2: 26 mmol/L (ref 22–32)
Calcium: 9.6 mg/dL (ref 8.9–10.3)
Chloride: 102 mmol/L (ref 98–111)
Creatinine, Ser: 0.86 mg/dL (ref 0.44–1.00)
GFR calc Af Amer: >60 mL/min (ref >60)
GFR calc non Af Amer: >60 mL/min (ref >60)
Glucose, Bld: 81 mg/dL (ref 70–99)
Potassium: 3.9 mmol/L (ref 3.5–5.1)
Sodium: 140 mmol/L (ref 135–145)

## 2019-08-17 LAB — CBC
HCT: 41.1 % (ref 36.0–46.0)
Hemoglobin: 13.4 g/dL (ref 12.0–15.0)
MCH: 29.8 pg (ref 26.0–34.0)
MCHC: 32.6 g/dL (ref 30.0–36.0)
MCV: 91.3 fL (ref 80.0–100.0)
Platelets: 329 10*3/uL (ref 150–400)
RBC: 4.5 MIL/uL (ref 3.87–5.11)
RDW: 12.5 % (ref 11.5–15.5)
WBC: 8.1 10*3/uL (ref 4.0–10.5)
nRBC: 0 % (ref 0.0–0.2)

## 2019-08-17 LAB — D-DIMER, QUANTITATIVE: D-Dimer, Quant: 0.48 ug/mL-FEU (ref 0.00–0.50)

## 2019-08-17 MED ORDER — ASPIRIN 81 MG PO CHEW
324.0000 mg | CHEWABLE_TABLET | Freq: Once | ORAL | Status: AC
  Administered 2019-08-17: 324 mg via ORAL
  Filled 2019-08-17: qty 4

## 2019-08-17 MED ORDER — KETOROLAC TROMETHAMINE 30 MG/ML IJ SOLN
30.0000 mg | Freq: Once | INTRAMUSCULAR | Status: AC
  Administered 2019-08-17: 30 mg via INTRAVENOUS
  Filled 2019-08-17: qty 1

## 2019-08-17 NOTE — Telephone Encounter (Signed)
Pt. Reports she started having chest pain yesterday. Took medication for "indigestion , it helped a little and the pain came back." Pain is 8/10. Pain radiates to neck, back and left arm. Refuses EMS, "I will have my Mom take me now."  Reason for Disposition . Pain also in shoulder(s) or arm(s) or jaw  Answer Assessment - Initial Assessment Questions 1. LOCATION: "Where does it hurt?"       Hurts in the middle and towards the left 2. RADIATION: "Does the pain go anywhere else?" (e.g., into neck, jaw, arms, back)     Neck and back and arm 3. ONSET: "When did the chest pain begin?" (Minutes, hours or days)      Yesterday 4. PATTERN "Does the pain come and go, or has it been constant since it started?"  "Does it get worse with exertion?"      Lightens up and then comes back 5. DURATION: "How long does it last" (e.g., seconds, minutes, hours)     Hours 6. SEVERITY: "How bad is the pain?"  (e.g., Scale 1-10; mild, moderate, or severe)    - MILD (1-3): doesn't interfere with normal activities     - MODERATE (4-7): interferes with normal activities or awakens from sleep    - SEVERE (8-10): excruciating pain, unable to do any normal activities       8 7. CARDIAC RISK FACTORS: "Do you have any history of heart problems or risk factors for heart disease?" (e.g., angina, prior heart attack; diabetes, high blood pressure, high cholesterol, smoker, or strong family history of heart disease)     HTN 8. PULMONARY RISK FACTORS: "Do you have any history of lung disease?"  (e.g., blood clots in lung, asthma, emphysema, birth control pills)     Asthma 9. CAUSE: "What do you think is causing the chest pain?"     Unsure 10. OTHER SYMPTOMS: "Do you have any other symptoms?" (e.g., dizziness, nausea, vomiting, sweating, fever, difficulty breathing, cough)       Lightheaded 11. PREGNANCY: "Is there any chance you are pregnant?" "When was your last menstrual period?"       No  Protocols used: CHEST  PAIN-A-AH

## 2019-08-17 NOTE — ED Provider Notes (Signed)
[facility] EMERGENCY DEPARTMENT Provider Note   CSN: [id number] Arrival date & time: 08/17/19  1150     History   Chief Complaint Chief Complaint  Patient presents with  . Chest Pain  . Headache    HPI [name] is a 48 y.o. female.  She is presenting to the emergency department with complaint of chest pressure radiating to her left arm neck shoulder and upper back that started yesterday.  She said she feels a little short of breath with it.  Little lightheadedness.  Nauseous but no vomiting or diarrhea.  No fevers or chills no cough.  Denies any trauma.  She did do a plane flight to [place] last month.  No prior history of DVT or PE.  No prior cardiac history.  HPI: A 48 year old patient with a history of hypertension presents for evaluation of chest pain. Initial onset of pain was more than 6 hours ago. The patient's chest pain is described as heaviness/pressure/tightness and is not worse with exertion. The patient complains of nausea. The patient's chest pain is middle- or left-sided, is not well-localized, is not sharp and does radiate to the arms/jaw/neck. The patient denies diaphoresis. The patient has no history of stroke, has no history of peripheral artery disease, has not smoked in the past 90 days, denies any history of treated diabetes, has no relevant family history of coronary artery disease (first degree relative at less than age 30), has no history of hypercholesterolemia and does not have an elevated BMI (>=30).    Chest Pain Associated symptoms: headache and shortness of breath   Associated symptoms: no abdominal pain and no fever   Headache Associated symptoms: no abdominal pain, no fever and no sore throat     Past Medical History:  Diagnosis Date  . Allergy   . Asthma   . GERD (gastroesophageal reflux disease)    no meds  . H/O: myomectomy   . Hearing impaired    can hear on left side only  . Heart murmur   . Herpes genitalia   .  Hypertension    pulmonary  . Refusal of blood transfusions as patient is Jehovah's Witness   . TIA (transient ischemic attack)     Patient Active Problem List   Diagnosis Date Noted  . Acute pain of left knee 11/01/2018  . Gastroenteritis 10/05/2017  . Trigger finger of right hand 12/16/2016  . Low back pain with radiation 11/29/2016  . Edema extremities 04/27/2016  . Chest pain 01/04/2016  . Knee pain, right 11/20/2015  . Right shoulder pain 07/11/2015  . LUQ pain 04/10/2015  . Vaginal discharge 02/15/2015  . UTI symptoms 01/15/2015  . Bacterial vaginitis 12/24/2014  . STD exposure 06/11/2013  . Headache(784.0) 11/12/2012  . Hypokalemia 11/10/2012  . TIA (transient ischemic attack) 11/09/2012  . Nerve compression 05/20/2012  . GERD (gastroesophageal reflux disease)   . Herpes genitalia   . H/O: myomectomy   . Fibroid uterus 03/17/2012  . PULMONARY HYPERTENSION 11/06/2010  . Nausea without vomiting 06/25/2009  . Morbid obesity ([place]) 12/04/2008  . Essential hypertension 10/22/2007  . HPV 03/24/2007  . LOSS, HEARING NOS 03/24/2007  . Mild intermittent asthma 03/24/2007    Past Surgical History:  Procedure Laterality Date  . BREAST LUMPECTOMY  01/06/11  . CYSTOSCOPY  05/19/2012   Procedure: CYSTOSCOPY;  Surgeon: Alwyn Pea, MD;  Location: [facility];  Service: Gynecology;  Laterality: N/A;  . MYOMECTOMY  2004     2010  robotic  . OVARIAN CYST REMOVAL  2010   x2  . ROBOTIC ASSISTED LAP VAGINAL HYSTERECTOMY    . ROBOTIC ASSISTED LAPAROSCOPIC OVARIAN CYSTECTOMY Right 03/20/2015   Procedure: ROBOTIC ASSISTED LAPAROSCOPIC Salpingectomy OVARIAN CYSTECTOMY, pelvic washings, excision of peritoneal pseudocyst.;  Surgeon: Delsa Bern, MD;  Location: [facility];  Service: Gynecology;  Laterality: Right;     OB History    Gravida  1   Para  1   Term      Preterm      AB      Living  1     SAB      TAB      Ectopic      Multiple      Live Births                Home Medications    Prior to Admission medications   Medication Sig Start Date End Date Taking? Authorizing Provider  albuterol (PROVENTIL HFA;VENTOLIN HFA) 108 (90 Base) MCG/ACT inhaler Inhale 2 puffs into the lungs every 6 (six) hours as needed for wheezing. 02/10/19  Yes Cirigliano, Mary K, DO  ALPRAZolam (XANAX) 0.25 MG tablet Take 1 tablet (0.25 mg total) by mouth 2 (two) times daily as needed for anxiety. 06/03/19  Yes Cirigliano, Mary K, DO  Ascorbic Acid (VITAMIN C) 1000 MG tablet Take 1,000 mg by mouth daily.   Yes [provider]  aspirin 325 MG tablet Take 1 tablet (325 mg total) by mouth daily. 11/10/12  Yes Isaac Bliss, Rayford Halsted, MD  cetirizine (ZYRTEC) 10 MG tablet Take 10 mg by mouth daily.   Yes [provider]  cholecalciferol (VITAMIN D) 1000 UNITS tablet Take 1,000 Units by mouth daily.   Yes [provider]  estradiol (VIVELLE-DOT) 0.0375 MG/24HR Place 1 patch onto the skin 2 (two) times a week. 10/14/16  Yes [provider]  fluticasone (FLONASE) 50 MCG/ACT nasal spray Place 2 sprays into both nostrils daily. 10/21/16  Yes Debbrah Alar, NP  lisinopril (ZESTRIL) 10 MG tablet TAKE 1 TABLET BY MOUTH EVERY DAY 07/26/19  Yes Cirigliano, Mary K, DO  metoprolol tartrate (LOPRESSOR) 100 MG tablet TAKE 1 TABLET BY MOUTH TWICE A DAY 07/26/19  Yes Cirigliano, Mary K, DO  Multiple Vitamins-Calcium (ONE-A-DAY WOMENS PO) Take 1 tablet by mouth daily.   Yes [provider]  spironolactone (ALDACTONE) 25 MG tablet TAKE 1 TABLET BY MOUTH EVERY DAY 07/26/19  Yes Cirigliano, Mary K, DO  valACYclovir (VALTREX) 500 MG tablet Take 500 mg by mouth daily.  06/02/13  Yes [provider]    Family History Family History  Problem Relation Age of Onset  . Asthma Mother   . Arthritis Mother   . Hypertension Father   . Heart disease Maternal Grandmother        PCI  . Cancer Maternal Grandmother 24       breast  . Cancer Other 60        breast and possible colon  . Migraines Sister   . Diabetes Brother   . Heart attack Paternal Grandfather        before age 46    Social History Social History   Tobacco Use  . Smoking status: Never Smoker  . Smokeless tobacco: Never Used  Substance Use Topics  . Alcohol use: No    Alcohol/week: 0.0 standard drinks  . Drug use: No     Allergies   Patient has no known allergies.   Review  of Systems Review of Systems  Constitutional: Negative for fever.  HENT: Negative for sore throat.   Eyes: Negative for visual disturbance.  Respiratory: Positive for shortness of breath.   Cardiovascular: Positive for chest pain. Negative for leg swelling.  Gastrointestinal: Negative for abdominal pain.  Genitourinary: Negative for dysuria.  Musculoskeletal: Negative for gait problem.  Skin: Negative for rash.  Neurological: Positive for headaches.     Physical Exam Updated Vital Signs BP 131/81 (BP Location: Right Arm)   Pulse 63   Temp 98.5 F (36.9 C) (Oral)   Resp 18   Ht 5\' 3"  (1.6 m)   Wt 97.8 kg   LMP 04/18/2012   SpO2 99%   BMI 38.17 kg/m   Physical Exam Vitals signs and nursing note reviewed.  Constitutional:      General: She is not in acute distress.    Appearance: She is well-developed.  HENT:     Head: Normocephalic and atraumatic.  Eyes:     Conjunctiva/sclera: Conjunctivae normal.  Neck:     Musculoskeletal: Neck supple.  Cardiovascular:     Rate and Rhythm: Normal rate and regular rhythm.     Heart sounds: Normal heart sounds. No murmur.  Pulmonary:     Effort: Pulmonary effort is normal. No respiratory distress.     Breath sounds: Normal breath sounds.  Abdominal:     Palpations: Abdomen is soft.     Tenderness: There is no abdominal tenderness.  Musculoskeletal: Normal range of motion.     Right lower leg: She exhibits no tenderness. No edema.     Left lower leg: She exhibits no tenderness. No edema.  Skin:    General: Skin is warm and dry.      Capillary Refill: Capillary refill takes less than 2 seconds.  Neurological:     General: No focal deficit present.     Mental Status: She is alert.      ED Treatments / Results  Labs (all labs ordered are listed, but only abnormal results are displayed) Labs Reviewed  BASIC METABOLIC PANEL  CBC  D-DIMER, QUANTITATIVE (NOT AT [facility])  TROPONIN I (HIGH SENSITIVITY)  TROPONIN I (HIGH SENSITIVITY)    EKG EKG Interpretation  Date/Time:  Wednesday August 17 2019 12:00:03 EDT Ventricular Rate:  66 PR Interval:    QRS Duration: 83 QT Interval:  400 QTC Calculation: 420 R Axis:   69 Text Interpretation:  Sinus rhythm Baseline wander in lead(s) II III aVF V1 improved t wave inversion from prior 2/17 Confirmed by Aletta Edouard [phone]) on 08/17/2019 12:05:06 PM   Radiology Dg Chest 2 View  Result Date: 08/17/2019 CLINICAL DATA:  Chest pain.  Shortness of breath. EXAM: CHEST - 2 VIEW COMPARISON:  10/21/2016. FINDINGS: Mediastinum hilar structures normal. Heart size normal. No focal infiltrate. No pleural effusion or pneumothorax. No acute bony abnormality. IMPRESSION: No acute cardiopulmonary disease.  Chest is stable from prior exam. Electronically Signed   By: Marcello Moores  Register   On: 08/17/2019 12:41    Procedures Procedures (including critical care time)  Medications Ordered in ED Medications  aspirin chewable tablet 324 mg (324 mg Oral Given 08/17/19 1302)  ketorolac (TORADOL) 30 MG/ML injection 30 mg (30 mg Intravenous Given 08/17/19 1334)     Initial Impression / Assessment and Plan / ED Course  I have reviewed the triage vital signs and the nursing notes.  Pertinent labs & imaging results that were available during my care of the patient were reviewed by me  and considered in my medical decision making (see chart for details).  Clinical Course as of Aug 16 1653  Wed Aug 16, 2820  2376 48 year old female with no prior cardiac history here with chest pressure  shortness of breath nausea since yesterday.  Recent travel to [place] last month.  Differential includes ACS, pneumonia, PE, musculoskeletal, pneumothorax, GERD   [MB]  1250 Chest x-ray reviewed by me and unremarkable.  EKG no acute changes.  Labs came back and fairly normal including a d-dimer of 0.48.  Giving aspirin and will trend troponins.   [MB]  [id number] Reevaluated patient. She said she is feeling better. Understands waiting on second troponin. If no significant change will discharge to followup with her pcp.    [MB]    Clinical Course User Index [MB] Hayden Rasmussen, MD    [name] Score: 4   Final Clinical Impressions(s) / ED Diagnoses   Final diagnoses:  Nonspecific chest pain    ED Discharge Orders    None       Hayden Rasmussen, MD 08/17/19 1655

## 2019-08-17 NOTE — Discharge Instructions (Signed)
You were seen in the emergency department for left-sided chest pain into your arm and neck.  You had blood work EKG and a chest x-ray that did not show any obvious sign of heart injury or blood clot.  Will be important for you to follow-up with your primary care doctor for further evaluation.  Please return if any worsening symptoms.

## 2019-08-17 NOTE — ED Triage Notes (Signed)
Left sided chest pain radiates to neck, left arm and back since yesterday.  Pt states she has some sob.  Denies fever.  Some N/D since this weekend. Some lightheadedness.  Pt states she traveled to [place] last month.  No heavy lifting.  Pt also believes she is going through menopause.

## 2019-08-17 NOTE — Telephone Encounter (Signed)
Pt is currently at ED

## 2019-11-03 ENCOUNTER — Other Ambulatory Visit: Payer: Self-pay | Admitting: Family Medicine

## 2019-11-03 DIAGNOSIS — F419 Anxiety disorder, unspecified: Secondary | ICD-10-CM

## 2019-11-04 NOTE — Telephone Encounter (Signed)
Last OV 05/20/19 Last fill 06/03/19  #60/0

## 2020-01-10 ENCOUNTER — Other Ambulatory Visit: Payer: Self-pay | Admitting: Family Medicine

## 2020-01-10 DIAGNOSIS — F419 Anxiety disorder, unspecified: Secondary | ICD-10-CM

## 2020-01-10 NOTE — Telephone Encounter (Signed)
Requesting: Alprazolam Contract: None UDS: 4.22.2019 Last OV: 6.26.2020 Next OV: Not scheduled Last Refill: 12.11.2020   Please advise/thx dmf

## 2020-01-11 NOTE — Telephone Encounter (Signed)
Med refilled but pt will need an OV in next 30 days for UDS, contact, med refill.

## 2020-04-04 ENCOUNTER — Other Ambulatory Visit: Payer: Self-pay | Admitting: Family Medicine

## 2020-04-04 DIAGNOSIS — F419 Anxiety disorder, unspecified: Secondary | ICD-10-CM

## 2020-04-05 NOTE — Telephone Encounter (Signed)
Last OV 05/20/19 Last fill  01/11/20  #60/0

## 2020-04-05 NOTE — Telephone Encounter (Signed)
Pt needs appt as she has not been seen since 04/2019

## 2020-04-30 ENCOUNTER — Other Ambulatory Visit: Payer: Self-pay | Admitting: Family Medicine

## 2020-04-30 DIAGNOSIS — I10 Essential (primary) hypertension: Secondary | ICD-10-CM

## 2020-05-01 ENCOUNTER — Other Ambulatory Visit: Payer: Self-pay

## 2020-05-01 DIAGNOSIS — I10 Essential (primary) hypertension: Secondary | ICD-10-CM

## 2020-05-01 MED ORDER — LISINOPRIL 10 MG PO TABS: 10.0000 mg | ORAL_TABLET | Freq: Every day | ORAL | 2 refills | Status: DC

## 2020-05-01 MED ORDER — SPIRONOLACTONE 25 MG PO TABS: 25.0000 mg | ORAL_TABLET | Freq: Every day | ORAL | 2 refills | Status: DC

## 2020-05-01 MED ORDER — METOPROLOL TARTRATE 100 MG PO TABS: 100.0000 mg | ORAL_TABLET | Freq: Two times a day (BID) | ORAL | 2 refills | Status: DC

## 2020-05-22 ENCOUNTER — Other Ambulatory Visit: Payer: Self-pay | Admitting: Family Medicine

## 2020-05-22 DIAGNOSIS — J4520 Mild intermittent asthma, uncomplicated: Secondary | ICD-10-CM

## 2020-05-22 NOTE — Telephone Encounter (Signed)
Last fill 03/19//20 #1/2 Last OV 05/20/19 Next OV 06/06/2020

## 2020-06-01 ENCOUNTER — Encounter: Payer: 59 | Admitting: Family Medicine

## 2020-06-05 ENCOUNTER — Other Ambulatory Visit: Payer: Self-pay

## 2020-06-06 ENCOUNTER — Encounter: Payer: Self-pay | Admitting: Family Medicine

## 2020-06-06 ENCOUNTER — Ambulatory Visit (INDEPENDENT_AMBULATORY_CARE_PROVIDER_SITE_OTHER): Payer: No Typology Code available for payment source | Admitting: Family Medicine

## 2020-06-06 VITALS — BP 110/70 | HR 98 | Temp 96.9°F | Ht 62.75 in | Wt 218.0 lb

## 2020-06-06 DIAGNOSIS — Z1211 Encounter for screening for malignant neoplasm of colon: Secondary | ICD-10-CM

## 2020-06-06 DIAGNOSIS — I10 Essential (primary) hypertension: Secondary | ICD-10-CM

## 2020-06-06 DIAGNOSIS — R600 Localized edema: Secondary | ICD-10-CM

## 2020-06-06 DIAGNOSIS — Z0000 Encounter for general adult medical examination without abnormal findings: Secondary | ICD-10-CM | POA: Diagnosis not present

## 2020-06-06 DIAGNOSIS — F419 Anxiety disorder, unspecified: Secondary | ICD-10-CM | POA: Diagnosis not present

## 2020-06-06 DIAGNOSIS — J4520 Mild intermittent asthma, uncomplicated: Secondary | ICD-10-CM | POA: Diagnosis not present

## 2020-06-06 LAB — CBC
HCT: 38.8 % (ref 36.0–46.0)
Hemoglobin: 12.9 g/dL (ref 12.0–15.0)
MCHC: 33.2 g/dL (ref 30.0–36.0)
MCV: 91.6 fl (ref 78.0–100.0)
Platelets: 319 10*3/uL (ref 150.0–400.0)
RBC: 4.23 Mil/uL (ref 3.87–5.11)
RDW: 14.3 % (ref 11.5–15.5)
WBC: 8.5 10*3/uL (ref 4.0–10.5)

## 2020-06-06 LAB — LIPID PANEL
Cholesterol: 149 mg/dL (ref 0–200)
HDL: 45.1 mg/dL (ref >39.00)
LDL Cholesterol: 75 mg/dL (ref 0–99)
NonHDL: 103.78
Total CHOL/HDL Ratio: 3
Triglycerides: 145 mg/dL (ref 0.0–149.0)
VLDL: 29 mg/dL (ref 0.0–40.0)

## 2020-06-06 LAB — AST: AST: 20 U/L (ref 0–37)

## 2020-06-06 LAB — BASIC METABOLIC PANEL
BUN: 14 mg/dL (ref 6–23)
CO2: 28 mEq/L (ref 19–32)
Calcium: 9.5 mg/dL (ref 8.4–10.5)
Chloride: 105 mEq/L (ref 96–112)
Creatinine, Ser: 0.92 mg/dL (ref 0.40–1.20)
GFR: 78.62 mL/min (ref >60.00)
Glucose, Bld: 83 mg/dL (ref 70–99)
Potassium: 4.1 mEq/L (ref 3.5–5.1)
Sodium: 141 mEq/L (ref 135–145)

## 2020-06-06 LAB — VITAMIN D 25 HYDROXY (VIT D DEFICIENCY, FRACTURES): VITD: 45.4 ng/mL (ref 30.00–100.00)

## 2020-06-06 LAB — ALT: ALT: 33 U/L (ref 0–35)

## 2020-06-06 MED ORDER — ALPRAZOLAM 0.25 MG PO TABS: ORAL_TABLET | ORAL | 0 refills | Status: DC

## 2020-06-06 MED ORDER — SPIRONOLACTONE 25 MG PO TABS: 25.0000 mg | ORAL_TABLET | Freq: Every day | ORAL | 3 refills | Status: DC

## 2020-06-06 MED ORDER — LISINOPRIL 10 MG PO TABS: 10.0000 mg | ORAL_TABLET | Freq: Every day | ORAL | 3 refills | Status: DC

## 2020-06-06 MED ORDER — METOPROLOL TARTRATE 100 MG PO TABS: 100.0000 mg | ORAL_TABLET | Freq: Two times a day (BID) | ORAL | 3 refills | Status: DC

## 2020-06-06 NOTE — Patient Instructions (Signed)
Compression stockings Keep feet/legs elevated when seated Increase water intake Limit sodium/salt intake

## 2020-06-06 NOTE — Progress Notes (Signed)
[name] is a 49 y.o. female  Chief Complaint  Patient presents with  . Annual Exam    Pt is had toast this morning.  Pt is up to date on Mammogram, 07/2019.  Pt is a pt at [facility] and UTD on Pap.    HPI: [name] is a 49 y.o. female seen today for annual CPE, fasting labs.  Brother passed away from [place] in 01-15-2020, pt witnessed him collapse and die at home.   Last PAP: UTD - follows with GYN  Last mammo: 07/2019 Last colonoscopy: never   Diet/Exercise: working to improve diet,  Dental: UTD Vision: wears glasses and UTD on exam  Med refills needed today? See orders  Past Medical History:  Diagnosis Date  . Allergy   . Asthma   . GERD (gastroesophageal reflux disease)    no meds  . H/O: myomectomy   . Hearing impaired    can hear on left side only  . Heart murmur   . Herpes genitalia   . Hypertension    pulmonary  . Refusal of blood transfusions as patient is Jehovah's Witness   . TIA (transient ischemic attack)     Past Surgical History:  Procedure Laterality Date  . BREAST LUMPECTOMY  01/06/11  . CYSTOSCOPY  05/19/2012   Procedure: CYSTOSCOPY;  Surgeon: Alwyn Pea, MD;  Location: [facility];  Service: Gynecology;  Laterality: N/A;  . MYOMECTOMY  2004     2010 robotic  . OVARIAN CYST REMOVAL  2010   x2  . ROBOTIC ASSISTED LAP VAGINAL HYSTERECTOMY    . ROBOTIC ASSISTED LAPAROSCOPIC OVARIAN CYSTECTOMY Right 03/20/2015   Procedure: ROBOTIC ASSISTED LAPAROSCOPIC Salpingectomy OVARIAN CYSTECTOMY, pelvic washings, excision of peritoneal pseudocyst.;  Surgeon: Delsa Bern, MD;  Location: [facility];  Service: Gynecology;  Laterality: Right;    Social History   Socioeconomic History  . Marital status: Single    Spouse name: Not on file  . Number of children: Not on file  . Years of education: Not on file  . Highest education level: Not on file  Occupational History  . Not on file  Tobacco Use  . Smoking status: Never Smoker  .  Smokeless tobacco: Never Used  Substance and Sexual Activity  . Alcohol use: No    Alcohol/week: 0.0 standard drinks  . Drug use: No  . Sexual activity: Yes    Partners: Male    Birth control/protection: Surgical    Comment: hyst  Other Topics Concern  . Not on file  Social History Narrative   Exercise---walk, zumba, yoga, pole dancing class   Social Determinants of Health   Financial Resource Strain:   . Difficulty of Paying Living Expenses:   Food Insecurity:   . Worried About Charity fundraiser in the Last Year:   . Arboriculturist in the Last Year:   Transportation Needs:   . Film/video editor (Medical):   Marland Kitchen Lack of Transportation (Non-Medical):   Physical Activity:   . Days of Exercise per Week:   . Minutes of Exercise per Session:   Stress:   . Feeling of Stress :   Social Connections:   . Frequency of Communication with Friends and Family:   . Frequency of Social Gatherings with Friends and Family:   . Attends Religious Services:   . Active Member of Clubs or Organizations:   . Attends Archivist Meetings:   Marland Kitchen Marital Status:  Intimate Partner Violence:   . Fear of Current or Ex-Partner:   . Emotionally Abused:   Marland Kitchen Physically Abused:   . Sexually Abused:     Family History  Problem Relation Age of Onset  . Asthma Mother   . Arthritis Mother   . Hypertension Father   . Heart disease Maternal Grandmother        PCI  . Cancer Maternal Grandmother 10       breast  . Cancer Other 60       breast and possible colon  . Migraines Sister   . Diabetes Brother   . Heart attack Paternal Grandfather        before age 46     Immunization History  Administered Date(s) Administered  . Influenza Split 12/25/2012, 09/09/2017  . Influenza Whole 08/29/2009, 07/30/2011, 08/31/2012  . Influenza,inj,Quad PF,6+ Mos 08/24/2013  . Influenza-Unspecified 08/24/2014, 09/12/2015, 09/25/2016  . Pneumococcal Polysaccharide-23 10/08/2010  . Td 01/05/2008  .  Tdap 03/15/2018    Outpatient Encounter Medications as of 06/06/2020  Medication Sig Note  . albuterol (VENTOLIN HFA) 108 (90 Base) MCG/ACT inhaler TAKE 2 PUFFS BY MOUTH EVERY 6 HOURS AS NEEDED FOR WHEEZE   . ALPRAZolam (XANAX) 0.25 MG tablet TAKE 1 TABLET BY MOUTH TWICE A DAY AS NEEDED FOR ANXIETY   . Ascorbic Acid (VITAMIN C) 1000 MG tablet Take 1,000 mg by mouth daily.   Marland Kitchen aspirin 325 MG tablet Take 1 tablet (325 mg total) by mouth daily.   . cetirizine (ZYRTEC) 10 MG tablet Take 10 mg by mouth daily.   . cholecalciferol (VITAMIN D) 1000 UNITS tablet Take 1,000 Units by mouth daily.   . fluticasone (FLONASE) 50 MCG/ACT nasal spray Place 2 sprays into both nostrils daily.   Marland Kitchen lisinopril (ZESTRIL) 10 MG tablet Take 1 tablet (10 mg total) by mouth daily.   . metoprolol tartrate (LOPRESSOR) 100 MG tablet Take 1 tablet (100 mg total) by mouth 2 (two) times daily.   . Multiple Vitamins-Calcium (ONE-A-DAY WOMENS PO) Take 1 tablet by mouth daily.   Marland Kitchen spironolactone (ALDACTONE) 25 MG tablet Take 1 tablet (25 mg total) by mouth daily.   . valACYclovir (VALTREX) 500 MG tablet Take 500 mg by mouth daily.  03/07/2015: .  Marland Kitchen [DISCONTINUED] ALPRAZolam (XANAX) 0.25 MG tablet TAKE 1 TABLET BY MOUTH TWICE A DAY AS NEEDED FOR ANXIETY   . [DISCONTINUED] lisinopril (ZESTRIL) 10 MG tablet TAKE 1 TABLET BY MOUTH EVERY DAY   . [DISCONTINUED] metoprolol tartrate (LOPRESSOR) 100 MG tablet TAKE 1 TABLET BY MOUTH TWICE A DAY   . [DISCONTINUED] spironolactone (ALDACTONE) 25 MG tablet TAKE 1 TABLET BY MOUTH EVERY DAY   . estradiol (VIVELLE-DOT) 0.0375 MG/24HR Place 1 patch onto the skin 2 (two) times a week. 10/21/2016: Received from: External Pharmacy  . [DISCONTINUED] lisinopril (ZESTRIL) 10 MG tablet Take 1 tablet (10 mg total) by mouth daily.   . [DISCONTINUED] metoprolol tartrate (LOPRESSOR) 100 MG tablet Take 1 tablet (100 mg total) by mouth 2 (two) times daily.   . [DISCONTINUED] spironolactone (ALDACTONE) 25 MG  tablet Take 1 tablet (25 mg total) by mouth daily.    No facility-administered encounter medications on file as of 06/06/2020.     ROS: Gen: no fever, chills  Skin: no rash, itching ENT: no ear pain, ear drainage, nasal congestion, rhinorrhea, sinus pressure, sore throat Eyes: no blurry vision, double vision Resp: no cough, wheeze,SOB CV: no CP, palpitations, + LE edema - worse as the day  goes on, resolves overnight GI: no heartburn, n/v/d/c, abd pain GU: no dysuria, urgency, frequency, hematuria MSK: no joint pain, myalgias, back pain Neuro: no dizziness, headache, weakness, vertigo Psych: no depression, anxiety, + poor sleep   No Known Allergies  BP 110/70 (BP Location: Left Arm, Patient Position: Sitting, Cuff Size: Large)   Pulse 98   Temp (!) 96.9 F (36.1 C) (Temporal)   Ht 5' 2.75" (1.594 m)   Wt 218 lb (98.9 kg)   LMP 04/18/2012   SpO2 (!) 74%   BMI 38.93 kg/m   Physical Exam Constitutional:      General: She is not in acute distress.    Appearance: She is well-developed.  HENT:     Head: Normocephalic and atraumatic.     Right Ear: Tympanic membrane and ear canal normal.     Left Ear: Tympanic membrane and ear canal normal.     Nose: Nose normal.  Eyes:     Conjunctiva/sclera: Conjunctivae normal.     Pupils: Pupils are equal, round, and reactive to light.  Neck:     Thyroid: No thyromegaly.  Cardiovascular:     Rate and Rhythm: Normal rate and regular rhythm.     Heart sounds: Normal heart sounds. No murmur heard.   Pulmonary:     Effort: Pulmonary effort is normal. No respiratory distress.     Breath sounds: Normal breath sounds. No wheezing or rhonchi.  Abdominal:     General: Bowel sounds are normal. There is no distension.     Palpations: Abdomen is soft. There is no mass.     Tenderness: There is no abdominal tenderness.  Musculoskeletal:     Cervical back: Neck supple.  Lymphadenopathy:     Cervical: No cervical adenopathy.  Skin:     General: Skin is warm and dry.  Neurological:     Mental Status: She is alert and oriented to person, place, and time.     Motor: No abnormal muscle tone.     Coordination: Coordination normal.  Psychiatric:        Behavior: Behavior normal.      A/P:  1. Annual physical exam - discussed importance of regular CV exercise, healthy diet, adequate sleep - UTD on dental and vision exams - UTD on mammo, PAP - referral for colonoscopy - ALT - AST - Basic metabolic panel - CBC - VITAMIN D 25 Hydroxy (Vit-D Deficiency, Fractures) - Lipid panel  2. Essential hypertension - controlled, at goal Refill: - lisinopril (ZESTRIL) 10 MG tablet; Take 1 tablet (10 mg total) by mouth daily.  Dispense: 90 tablet; Refill: 3 - metoprolol tartrate (LOPRESSOR) 100 MG tablet; Take 1 tablet (100 mg total) by mouth 2 (two) times daily.  Dispense: 180 tablet; Refill: 3 - spironolactone (ALDACTONE) 25 MG tablet; Take 1 tablet (25 mg total) by mouth daily.  Dispense: 90 tablet; Refill: 3 - Basic metabolic panel  3. Mild intermittent asthma without complication - stable, controlled - no recent exacerbations  4. Anxiety - stable, controlled Refill: - ALPRAZolam (XANAX) 0.25 MG tablet; TAKE 1 TABLET BY MOUTH TWICE A DAY AS NEEDED FOR ANXIETY  Dispense: 60 tablet; Refill: 0  5. Screening for colon cancer - Ambulatory referral to Gastroenterology  6. Bilateral lower extremity edema - compression stockings, elevated legs, increase water intake, take breaks from work to walk.move around  This visit occurred during the SARS-CoV-2 public health emergency.  Safety protocols were in place, including screening questions prior to the visit, additional  usage of staff PPE, and extensive cleaning of exam room while observing appropriate contact time as indicated for disinfecting solutions.

## 2020-06-08 ENCOUNTER — Other Ambulatory Visit: Payer: Self-pay | Admitting: Family Medicine

## 2020-06-08 DIAGNOSIS — F419 Anxiety disorder, unspecified: Secondary | ICD-10-CM

## 2020-06-11 ENCOUNTER — Other Ambulatory Visit: Payer: Self-pay | Admitting: Family Medicine

## 2020-06-11 DIAGNOSIS — I10 Essential (primary) hypertension: Secondary | ICD-10-CM

## 2020-06-11 MED ORDER — ALPRAZOLAM 0.25 MG PO TABS: ORAL_TABLET | ORAL | 0 refills | Status: DC

## 2020-06-11 NOTE — Telephone Encounter (Signed)
Last OV 06/06/20 Last fill 06/06/20  #60/0

## 2020-06-11 NOTE — Addendum Note (Signed)
Addended by: Dutch Quint B on: 06/11/2020 03:35 PM   Modules accepted: Orders

## 2020-06-22 ENCOUNTER — Encounter: Payer: Self-pay | Admitting: Gastroenterology

## 2020-08-06 ENCOUNTER — Other Ambulatory Visit: Payer: Self-pay

## 2020-08-06 ENCOUNTER — Ambulatory Visit (AMBULATORY_SURGERY_CENTER): Payer: Self-pay | Admitting: *Deleted

## 2020-08-06 VITALS — Ht 62.0 in | Wt 220.0 lb

## 2020-08-06 DIAGNOSIS — Z1211 Encounter for screening for malignant neoplasm of colon: Secondary | ICD-10-CM

## 2020-08-06 MED ORDER — SUTAB 1479-225-188 MG PO TABS: 1.0000 | ORAL_TABLET | Freq: Once | ORAL | 0 refills | Status: AC

## 2020-08-06 NOTE — Progress Notes (Signed)
Pt takes Adipolean to aid weight loss.  Ingredients reviewed and there is no mention of Phentermine.  No egg or soy allergy known to patient  No issues with past sedation with any surgeries or procedures no intubation problems in the past  No FH of Malignant Hyperthermia No diet pills per patient No home 02 use per patient  No blood thinners per patient  Pt denies issues with constipation  No A fib or A flutter  EMMI video to pt or via [place] 19 guidelines implemented in PV today with Pt and RN   [employer] Coupon given to pt in PV today , Code to Pharmacy   Due to the COVID-19 pandemic we are asking patients to follow these guidelines. Please only bring one care partner. Please be aware that your care partner may wait in the car in the parking lot or if they feel like they will be too hot to wait in the car, they may wait in the lobby on the 4th floor. All care partners are required to wear a mask the entire time (we do not have any that we can provide them), they need to practice social distancing, and we will do a Covid check for all patient's and care partners when you arrive. Also we will check their temperature and your temperature. If the care partner waits in their car they need to stay in the parking lot the entire time and we will call them on their cell phone when the patient is ready for discharge so they can bring the car to the front of the building. Also all patient's will need to wear a mask into building.

## 2020-08-07 ENCOUNTER — Encounter: Payer: Self-pay | Admitting: Gastroenterology

## 2020-08-24 ENCOUNTER — Ambulatory Visit (AMBULATORY_SURGERY_CENTER): Payer: No Typology Code available for payment source | Admitting: Gastroenterology

## 2020-08-24 ENCOUNTER — Other Ambulatory Visit: Payer: Self-pay

## 2020-08-24 ENCOUNTER — Encounter: Payer: Self-pay | Admitting: Gastroenterology

## 2020-08-24 VITALS — BP 127/70 | HR 69 | Temp 97.1°F | Resp 13 | Ht 62.0 in | Wt 220.0 lb

## 2020-08-24 DIAGNOSIS — D122 Benign neoplasm of ascending colon: Secondary | ICD-10-CM

## 2020-08-24 DIAGNOSIS — Z1211 Encounter for screening for malignant neoplasm of colon: Secondary | ICD-10-CM

## 2020-08-24 DIAGNOSIS — K635 Polyp of colon: Secondary | ICD-10-CM

## 2020-08-24 MED ORDER — SODIUM CHLORIDE 0.9 % IV SOLN: 500.0000 mL | Freq: Once | INTRAVENOUS | Status: DC

## 2020-08-24 NOTE — Progress Notes (Signed)
Called to room to assist during endoscopic procedure.  Patient ID and intended procedure confirmed with present staff. Received instructions for my participation in the procedure from the performing physician.  

## 2020-08-24 NOTE — Progress Notes (Signed)
VS by CW  Pt's states no medical or surgical changes since previsit or office visit.  

## 2020-08-24 NOTE — Patient Instructions (Signed)
Handout on polyps, diverticulosis and hemorrhoids given. ? ?YOU HAD AN ENDOSCOPIC PROCEDURE TODAY AT THE  ENDOSCOPY CENTER:   Refer to the procedure report that was given to you for any specific questions about what was found during the examination.  If the procedure report does not answer your questions, please call your gastroenterologist to clarify.  If you requested that your care partner not be given the details of your procedure findings, then the procedure report has been included in a sealed envelope for you to review at your convenience later. ? ?YOU SHOULD EXPECT: Some feelings of bloating in the abdomen. Passage of more gas than usual.  Walking can help get rid of the air that was put into your GI tract during the procedure and reduce the bloating. If you had a lower endoscopy (such as a colonoscopy or flexible sigmoidoscopy) you may notice spotting of blood in your stool or on the toilet paper. If you underwent a bowel prep for your procedure, you may not have a normal bowel movement for a few days. ? ?Please Note:  You might notice some irritation and congestion in your nose or some drainage.  This is from the oxygen used during your procedure.  There is no need for concern and it should clear up in a day or so. ? ?SYMPTOMS TO REPORT IMMEDIATELY: ? ?Following lower endoscopy (colonoscopy or flexible sigmoidoscopy): ? Excessive amounts of blood in the stool ? Significant tenderness or worsening of abdominal pains ? Swelling of the abdomen that is new, acute ? Fever of 100?F or higher ? ? ?For urgent or emergent issues, a gastroenterologist can be reached at any hour by calling [phone]. ?Do not use MyChart messaging for urgent concerns.  ? ? ?DIET:  We do recommend a small meal at first, but then you may proceed to your regular diet.  Drink plenty of fluids but you should avoid alcoholic beverages for 24 hours. ? ?ACTIVITY:  You should plan to take it easy for the rest of today and you  should NOT DRIVE or use heavy machinery until tomorrow (because of the sedation medicines used during the test).   ? ?FOLLOW UP: ?Our staff will call the number listed on your records 48-72 hours following your procedure to check on you and address any questions or concerns that you may have regarding the information given to you following your procedure. If we do not reach you, we will leave a message.  We will attempt to reach you two times.  During this call, we will ask if you have developed any symptoms of COVID 19. If you develop any symptoms (ie: fever, flu-like symptoms, shortness of breath, cough etc.) before then, please call [phone].  If you test positive for Covid 19 in the 2 weeks post procedure, please call and report this information to us.   ? ?If any biopsies were taken you will be contacted by phone or by letter within the next 1-3 weeks.  Please call us at [phone] if you have not heard about the biopsies in 3 weeks.  ? ? ?SIGNATURES/CONFIDENTIALITY: ?You and/or your care partner have signed paperwork which will be entered into your electronic medical record.  These signatures attest to the fact that that the information above on your After Visit Summary has been reviewed and is understood.  Full responsibility of the confidentiality of this discharge information lies with you and/or your care-partner.  ?

## 2020-08-24 NOTE — Op Note (Signed)
[facility] Patient Name: [name] Procedure Date: 08/24/2020 8:35 AM MRN: [id number] Endoscopist: Remo Lipps P. Havery Moros , MD Age: 49 Referring MD:  Date of Birth: [birth year] Gender: Female Account #: [id number] Procedure:                Colonoscopy Indications:              Screening for colorectal malignant neoplasm, This                            is the patient's first colonoscopy Medicines:                Monitored Anesthesia Care Procedure:                Pre-Anesthesia Assessment:                           - Prior to the procedure, a History and Physical                            was performed, and patient medications and                            allergies were reviewed. The patient's tolerance of                            previous anesthesia was also reviewed. The risks                            and benefits of the procedure and the sedation                            options and risks were discussed with the patient.                            All questions were answered, and informed consent                            was obtained. Prior Anticoagulants: The patient has                            taken no previous anticoagulant or antiplatelet                            agents. ASA Grade Assessment: III - A patient with                            severe systemic disease. After reviewing the risks                            and benefits, the patient was deemed in                            satisfactory condition to undergo the procedure.  After obtaining informed consent, the colonoscope                            was passed under direct vision. Throughout the                            procedure, the patient's blood pressure, pulse, and                            oxygen saturations were monitored continuously. The                            Colonoscope was introduced through the anus and                            advanced to the  the cecum, identified by                            appendiceal orifice and ileocecal valve. The                            colonoscopy was performed without difficulty. The                            patient tolerated the procedure well. The quality                            of the bowel preparation was good. The ileocecal                            valve, appendiceal orifice, and rectum were                            photographed. Scope In: 8:44:24 AM Scope Out: 9:07:33 AM Scope Withdrawal Time: 0 hours 19 minutes 41 seconds  Total Procedure Duration: 0 hours 23 minutes 9 seconds  Findings:                 Skin tags were found on perianal exam.                           Multiple small-mouthed diverticula were found in                            the entire colon.                           A 4 mm polyp was found in the ascending colon. The                            polyp was sessile. The polyp was removed with a                            cold snare. Resection and retrieval were complete.  Anal papilla(e) were hypertrophied.                           Internal hemorrhoids were found during retroflexion.                           The exam was otherwise without abnormality. Complications:            No immediate complications. Estimated blood loss:                            Minimal. Estimated Blood Loss:     Estimated blood loss was minimal. Impression:               - Perianal skin tags found on perianal exam.                           - Diverticulosis in the entire examined colon.                           - One 4 mm polyp in the ascending colon, removed                            with a cold snare. Resected and retrieved.                           - Anal papilla(e) were hypertrophied.                           - Internal hemorrhoids.                           - The examination was otherwise normal. Recommendation:           - Patient has a contact number  available for                            emergencies. The signs and symptoms of potential                            delayed complications were discussed with the                            patient. Return to normal activities tomorrow.                            Written discharge instructions were provided to the                            patient.                           - Resume previous diet.                           - Continue present medications.                           -  Await pathology results. Remo Lipps P. Neilson Oehlert, MD 08/24/2020 9:12:24 AM This report has been signed electronically.

## 2020-08-24 NOTE — Progress Notes (Signed)
pt tolerated well. VSS. awake and to recovery. Report given to RN.  

## 2020-08-28 ENCOUNTER — Telehealth: Payer: Self-pay

## 2020-08-28 NOTE — Telephone Encounter (Signed)
  Follow up Call-  Call back number 08/24/2020  Post procedure Call Back phone  # [phone]  Permission to leave phone message Yes  Some recent data might be hidden     Patient questions:  Do you have a fever, pain , or abdominal swelling? No. Pain Score  0 *  Have you tolerated food without any problems? Yes.    Have you been able to return to your normal activities? Yes.    Do you have any questions about your discharge instructions: Diet   No. Medications  No. Follow up visit  No.  Do you have questions or concerns about your Care? No.  Actions: * If pain score is 4 or above: No action needed, pain <4.  1. Have you developed a fever since your procedure? no  2.   Have you had an respiratory symptoms (SOB or cough) since your procedure? no  3.   Have you tested positive for COVID 19 since your procedure no  4.   Have you had any family members/close contacts diagnosed with the COVID 19 since your procedure?  no   If yes to any of these questions please route to Joylene John, RN and Joella Prince, RN

## 2020-08-28 NOTE — Telephone Encounter (Signed)
  Follow up Call-  Call back number 08/24/2020  Post procedure Call Back phone  # [phone]  Permission to leave phone message Yes  Some recent data might be hidden    1st follow up call made.  NALM

## 2020-09-24 ENCOUNTER — Other Ambulatory Visit: Payer: Self-pay | Admitting: Family

## 2020-09-24 DIAGNOSIS — F419 Anxiety disorder, unspecified: Secondary | ICD-10-CM

## 2020-09-27 ENCOUNTER — Telehealth: Payer: Self-pay | Admitting: Family Medicine

## 2020-09-27 NOTE — Telephone Encounter (Signed)
Patient is calling and I requesting a refill for xanax sent to [facility] on [address], please advise. CB is [phone]

## 2020-09-28 ENCOUNTER — Encounter: Payer: Self-pay | Admitting: Family Medicine

## 2020-09-28 DIAGNOSIS — F419 Anxiety disorder, unspecified: Secondary | ICD-10-CM

## 2020-09-28 MED ORDER — ALPRAZOLAM 0.25 MG PO TABS: ORAL_TABLET | ORAL | 0 refills | Status: DC

## 2020-09-28 NOTE — Telephone Encounter (Signed)
Message sent to to provider through another message. Please see chart.  Dm/cma

## 2020-10-01 ENCOUNTER — Other Ambulatory Visit: Payer: Self-pay | Admitting: Family Medicine

## 2020-10-01 DIAGNOSIS — I10 Essential (primary) hypertension: Secondary | ICD-10-CM

## 2020-10-01 NOTE — Telephone Encounter (Signed)
Last OV 06/06/20 Last fill 06/06/20 #90/3

## 2020-10-08 LAB — HM MAMMOGRAPHY

## 2020-10-09 ENCOUNTER — Encounter: Payer: Self-pay | Admitting: Family Medicine

## 2020-10-10 NOTE — Telephone Encounter (Signed)
Please see message and advise.  Thank you. ° °

## 2020-10-31 ENCOUNTER — Ambulatory Visit: Payer: No Typology Code available for payment source | Admitting: Family Medicine

## 2020-12-21 ENCOUNTER — Other Ambulatory Visit: Payer: Self-pay | Admitting: Family Medicine

## 2020-12-21 DIAGNOSIS — F419 Anxiety disorder, unspecified: Secondary | ICD-10-CM

## 2020-12-21 NOTE — Telephone Encounter (Signed)
Spoke to patient and scheduled an appt for 12/26/20 @ 10:30. Dm/cma

## 2020-12-21 NOTE — Telephone Encounter (Signed)
Received a refill request for:   Alprazolam 0.25 mg  LR 09/28/20. # 60, 0 rf LOV 06/06/20 FOV  None scheduled.   Please advise. Thanks. Dm/cma

## 2020-12-21 NOTE — Telephone Encounter (Signed)
Pt needs  f/u appt since xanax is controlled substance and requires q3-49mo regular f/u. VV is ok.

## 2020-12-26 ENCOUNTER — Encounter: Payer: Self-pay | Admitting: Family Medicine

## 2020-12-26 ENCOUNTER — Telehealth (INDEPENDENT_AMBULATORY_CARE_PROVIDER_SITE_OTHER): Payer: No Typology Code available for payment source | Admitting: Family Medicine

## 2020-12-26 DIAGNOSIS — F419 Anxiety disorder, unspecified: Secondary | ICD-10-CM | POA: Diagnosis not present

## 2020-12-26 MED ORDER — ALPRAZOLAM 0.25 MG PO TABS: 0.2500 mg | ORAL_TABLET | Freq: Two times a day (BID) | ORAL | 2 refills | Status: DC

## 2020-12-26 NOTE — Progress Notes (Signed)
Virtual Visit via Video Note  I connected with Ajanee Buren Riccardi on 12/26/20 at 10:30 AM EST by a video enabled telemedicine application and verified that I am speaking with the correct person using two identifiers. Location patient: home Location provider: work Persons participating in the virtual visit: patient, provider  I discussed the limitations of evaluation and management by telemedicine and the availability of in person appointments. The patient expressed understanding and agreed to proceed.  Chief Complaint  Patient presents with  . Follow-up    F/u anxiety.  No concerns.       HPI: [name] is a 50 y.o. female seen today to discuss anxiety. She is taking alprazolam 1/2 of 0.25mg  tab daily in AM and she would like to take 1 0.25mg  tab before bed to help with issues sleeping.  No side effects. She feels like it is effective. Biggest source of stress/anxiety is her brother's death due to covid in 12/02/2019. Pt witnessed pt collapse and die at home.    Depression screen [facility] 2/9 06/06/2020 03/15/2018 12/16/2016  Decreased Interest 0 0 0  Down, Depressed, Hopeless 0 0 0  PHQ - 2 Score 0 0 0  Some recent data might be hidden   No flowsheet data found.  Past Medical History:  Diagnosis Date  . Allergy   . Arthritis   . Asthma   . GERD (gastroesophageal reflux disease)    no meds  . H/O: myomectomy   . Hearing impaired    can hear on left side only  . Heart murmur   . Herpes genitalia   . Hypertension    pulmonary  . Refusal of blood transfusions as patient is Jehovah's Witness   . TIA (transient ischemic attack)     Past Surgical History:  Procedure Laterality Date  . BREAST LUMPECTOMY  01/06/11  . CYSTOSCOPY  05/19/2012   Procedure: CYSTOSCOPY;  Surgeon: Alwyn Pea, MD;  Location: [facility];  Service: Gynecology;  Laterality: N/A;  . MYOMECTOMY  2004     2010 robotic  . OVARIAN CYST REMOVAL  2010   x2  . ROBOTIC ASSISTED LAP VAGINAL HYSTERECTOMY    . ROBOTIC  ASSISTED LAPAROSCOPIC OVARIAN CYSTECTOMY Right 03/20/2015   Procedure: ROBOTIC ASSISTED LAPAROSCOPIC Salpingectomy OVARIAN CYSTECTOMY, pelvic washings, excision of peritoneal pseudocyst.;  Surgeon: Delsa Bern, MD;  Location: [facility];  Service: Gynecology;  Laterality: Right;    Family History  Problem Relation Age of Onset  . Asthma Mother   . Arthritis Mother   . Hypertension Father   . Heart disease Maternal Grandmother        PCI  . Cancer Maternal Grandmother 71       breast  . Cancer Other 60       breast and possible colon  . Migraines Sister   . Diabetes Brother   . Heart attack Paternal Grandfather        before age 59  . Colon cancer Neg Hx   . Colon polyps Neg Hx   . Stomach cancer Neg Hx   . Esophageal cancer Neg Hx     Social History   Tobacco Use  . Smoking status: Never Smoker  . Smokeless tobacco: Never Used  Substance Use Topics  . Alcohol use: No    Alcohol/week: 0.0 standard drinks  . Drug use: No     Current Outpatient Medications:  .  acetaminophen (TYLENOL) 650 MG CR tablet, Take 650 mg by mouth every 8 (eight)  hours as needed for pain., Disp: , Rfl:  .  albuterol (VENTOLIN HFA) 108 (90 Base) MCG/ACT inhaler, TAKE 2 PUFFS BY MOUTH EVERY 6 HOURS AS NEEDED FOR WHEEZE, Disp: 8 g, Rfl: 0 .  ALPRAZolam (XANAX) 0.25 MG tablet, TAKE 1 TABLET BY MOUTH TWICE A DAY AS NEEDED FOR ANXIETY, Disp: 60 tablet, Rfl: 0 .  Ascorbic Acid (VITAMIN C) 1000 MG tablet, Take 1,000 mg by mouth daily., Disp: , Rfl:  .  aspirin 325 MG tablet, Take 1 tablet (325 mg total) by mouth daily., Disp: , Rfl:  .  cetirizine (ZYRTEC) 10 MG tablet, Take 10 mg by mouth daily., Disp: , Rfl:  .  cholecalciferol (VITAMIN D) 1000 UNITS tablet, Take 1,000 Units by mouth daily., Disp: , Rfl:  .  fluticasone (FLONASE) 50 MCG/ACT nasal spray, Place 2 sprays into both nostrils daily., Disp: , Rfl: 2 .  lisinopril (ZESTRIL) 10 MG tablet, TAKE 1 TABLET BY MOUTH EVERY DAY, Disp: 90 tablet, Rfl: 3 .   metoprolol tartrate (LOPRESSOR) 100 MG tablet, Take 1 tablet (100 mg total) by mouth 2 (two) times daily., Disp: 180 tablet, Rfl: 3 .  Multiple Vitamins-Calcium (ONE-A-DAY WOMENS PO), Take 1 tablet by mouth daily., Disp: , Rfl:  .  NON FORMULARY, Adipolean twice a day for weight loss, Disp: , Rfl:  .  Rhubarb (ESTROVEN COMPLETE PO), Take by mouth., Disp: , Rfl:  .  spironolactone (ALDACTONE) 25 MG tablet, Take 1 tablet (25 mg total) by mouth daily., Disp: 90 tablet, Rfl: 3 .  valACYclovir (VALTREX) 500 MG tablet, Take 500 mg by mouth daily. , Disp: , Rfl:  .  estradiol (VIVELLE-DOT) 0.0375 MG/24HR, Place 1 patch onto the skin 2 (two) times a week. (Patient not taking: No sig reported), Disp: , Rfl:   Allergies  Allergen Reactions  . Dust Mite Extract   . Mold Extract [Trichophyton]   . Tomato       ROS: See pertinent positives and negatives per HPI.   EXAM:  VITALS per patient if applicable: Wt 220 lb (99.8 kg) Comment: pt reported  LMP 04/18/2012   BMI 40.24 kg/m    GENERAL: alert, oriented, appears well and in no acute distress  NECK: normal movements of the head and neck  LUNGS: on inspection no signs of respiratory distress, breathing rate appears normal, no obvious gross SOB, gasping or wheezing, no conversational dyspnea  CV: no obvious cyanosis  PSYCH/NEURO: pleasant and cooperative, speech and thought processing grossly intact   ASSESSMENT AND PLAN: 1. Anxiety - present since brothers sudden death in 12-Dec-2019 which pt witnessed - overall stable and controlled Refill: - ALPRAZolam (XANAX) 0.25 MG tablet; Take 1 tablet (0.25 mg total) by mouth 2 (two) times daily.  Dispense: 60 tablet; Refill: 2 - database reviewed and appropriate - needs updated UDS and controlled substance agreement at next OV - f/u in 6 mo or sooner PRN  I discussed the assessment and treatment plan with the patient. The patient was provided an opportunity to ask questions and all were  answered. The patient agreed with the plan and demonstrated an understanding of the instructions.   The patient was advised to call back or seek an in-person evaluation if the symptoms worsen or if the condition fails to improve as anticipated.   Letta Median, DO

## 2021-02-04 ENCOUNTER — Other Ambulatory Visit: Payer: Self-pay | Admitting: Family Medicine

## 2021-02-04 DIAGNOSIS — I10 Essential (primary) hypertension: Secondary | ICD-10-CM

## 2021-02-26 ENCOUNTER — Encounter: Payer: Self-pay | Admitting: Family Medicine

## 2021-02-26 DIAGNOSIS — E6609 Other obesity due to excess calories: Secondary | ICD-10-CM

## 2021-03-07 ENCOUNTER — Other Ambulatory Visit: Payer: Self-pay

## 2021-03-07 ENCOUNTER — Encounter: Payer: Self-pay | Admitting: Dietician

## 2021-03-07 ENCOUNTER — Encounter: Payer: No Typology Code available for payment source | Attending: Family Medicine | Admitting: Dietician

## 2021-03-07 VITALS — Ht 62.0 in | Wt 224.4 lb

## 2021-03-07 DIAGNOSIS — E669 Obesity, unspecified: Secondary | ICD-10-CM | POA: Insufficient documentation

## 2021-03-07 NOTE — Progress Notes (Signed)
Medical Nutrition Therapy  Appointment Start time:  [phone]  Appointment End time:  1750  Primary concerns today: Weight Loss  Referral diagnosis: E66.09 Obesity Preferred learning style: No preference indicated Learning readiness: Ready   NUTRITION ASSESSMENT   Anthropometrics  Ht: 5'2" Wt: 224.4 lbs Body mass index is 41.04 kg/m.   Clinical Medical Hx: HTN, GERD, Transient Ischemic Attack Medications: Lisinopril, Metoprolol, Spironalactone Labs: N/A Notable Signs/Symptoms: N/A  Lifestyle & Dietary Hx Pt is present for this visit as part of an [place] program for diet counseling related to obesity. 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 Works from home and is sedentary during the day. Pt uses a wellness app called [employer] to help set goals and work with a Advertising account planner. Pt got a genetic test through this program that revealed low dopamine levels. Pt was told that they use food to get a pleasure response, as well as having low leptin levels that were responsible for not knowing when they are full. Pt reports suffering from insomnia and fatigue now, attributes it to having COVID in 2020. Pt states their mother has OSA and father snores. Pt has history of Zumba, resistance training, and being active. Pt has a fitness app called "[employer]", but is not using it. Will occasionally use a stepper while at home, and bought home gym. Pt states that when they go all the way in on making changes, they always end up going back to normal.  Pt will sometimes miss lunch due to being busy, or trying to decide what they are going to eat that will be healthy. Pt tried an 8 week diet program previously that became monotonous and became frustrated. Pt likes structure in their diet. Pt reports cravings for sweets, and states it feels like these cravings are taking over their life. Pt is trying to follow a 1,500mg /day sodium  diet, has been using more herbs and spices. Pt uses their air fryer to cook often. Pt reports a tomato allergy when they are raw, but doesn't have a reaction when it cooked.  Estimated daily fluid intake: 120 oz Supplements: Vitamin C, Vitamin D, B12, Calcium Sleep: Does not sleep well, insomnia Stress / self-care: Weight Loss, Meal Planning Current average weekly physical activity: ADLs   24-Hr Dietary Recall First Meal: Special K with 2% milk, apple, glass of OJ Snack: carrots and lite ranch Second Meal: [place] chili, whole wheat crackers, grapefruit Snack: none Third Meal: Crunchy chicken breast, kale, corn Snack: handful of pretzels and peanuts Beverages: OJ, Water   NUTRITION DIAGNOSIS  NB-1.1 Food and nutrition-related knowledge deficit As related to obesity.  As evidenced by BMI of 41.04 kg/m2, skipping meals, and self reported emotional eating.Marland Kitchen   NUTRITION INTERVENTION  Nutrition education (E-1) on the following topics:  Educated patient on the balanced plate eating model. Recommended lunch and dinner be 1/2 non-starchy vegetables, 1/4 starches, and 1/4 protein. Recommended breakfast be a balance of starch and protein with a piece of fruit. Discussed with patient the importance of working towards hitting the proportions of the balanced plate consistently. Counseled patient on ways to begin recognizing each of the food groups from the balanced plate in their own meals, and how close they are to fitting the recommended proportions of the balanced plate. Educated patient on the nutritional value of each food group on the balanced plate model. Counseled patient on beginning to rebuild their trust in themselves to make the  right food choices for their health. Educated patient on mindful eating, including listening to their body's hunger and satiety cues, as well as eating slowly and allowing meals to be more of a sensory experience. Counseled patient on allowing themselves to be present  in their emotions when they consider emotional eating. Advised patient to evaluate whether the impulse to eat is hunger based, or emotionally driven.   Handouts Provided Include   Balanced Plate  Balanced Plate Food List  Learning Style & Readiness for Change Teaching method utilized: Visual & Auditory  Demonstrated degree of understanding via: Teach Back  Barriers to learning/adherence to lifestyle change: none  Goals Established by Pt  Aim for weight loss of no more than 1-2 pounds a week.  Consider talking to your doctor about getting a sleep study to see if may have sleep apnea.  Try sugar-free Jello, popsicles, and pudding. Cool-Whip and fruit is a great, low calorie treat!  When you find impulses to be strong, think about the series of events or behaviors that led up to where you are.  Remove all guilt and judgement from your food choices.   MONITORING & EVALUATION Dietary intake, weekly physical activity, and impulse control in 6 weeks.  Next Steps  Patient is to follow up with RDN.

## 2021-03-07 NOTE — Patient Instructions (Addendum)
Aim for weight loss of no more than 1-2 pounds a week.  Consider talking to your doctor about getting a sleep study to see if may have sleep apnea.  Try sugar-free Jello, popsicles, and pudding. Cool-Whip and fruit is a great, low calorie treat!  When you find impulses to be strong, think about the series of events or behaviors that led up to where you are.  Remove all guilt and judgement from your food choices.

## 2021-03-22 ENCOUNTER — Other Ambulatory Visit: Payer: Self-pay | Admitting: Obstetrics and Gynecology

## 2021-03-22 DIAGNOSIS — N6459 Other signs and symptoms in breast: Secondary | ICD-10-CM

## 2021-03-22 DIAGNOSIS — R599 Enlarged lymph nodes, unspecified: Secondary | ICD-10-CM

## 2021-04-11 ENCOUNTER — Ambulatory Visit
Admission: RE | Admit: 2021-04-11 | Discharge: 2021-04-11 | Disposition: A | Discharge status: Home / Self Care | Payer: No Typology Code available for payment source | Source: Ambulatory Visit | Attending: Obstetrics and Gynecology | Admitting: Obstetrics and Gynecology

## 2021-04-11 ENCOUNTER — Other Ambulatory Visit: Payer: Self-pay

## 2021-04-11 ENCOUNTER — Other Ambulatory Visit: Payer: Self-pay | Admitting: Obstetrics and Gynecology

## 2021-04-11 DIAGNOSIS — N6459 Other signs and symptoms in breast: Secondary | ICD-10-CM

## 2021-04-11 DIAGNOSIS — R599 Enlarged lymph nodes, unspecified: Secondary | ICD-10-CM

## 2021-04-15 ENCOUNTER — Encounter: Payer: Self-pay | Admitting: Dietician

## 2021-04-15 ENCOUNTER — Encounter: Payer: No Typology Code available for payment source | Attending: Family Medicine | Admitting: Dietician

## 2021-04-15 ENCOUNTER — Other Ambulatory Visit: Payer: Self-pay

## 2021-04-15 NOTE — Patient Instructions (Addendum)
Look into http://www.tucker.net/ for plenty of heart healthy recipe ideas that are low in saturated fat and sodium.  Keep up the great work exercising on your home gym.  Work up to exercising 3-4 days a week, every other day.  Look into some local meal delivery services to try for the month of June. -[employer] -[employer]

## 2021-04-15 NOTE — Progress Notes (Signed)
Medical Nutrition Therapy  Appointment Start time:  1700  Appointment End time:  73  Primary concerns today: Weight Loss  Referral diagnosis: E66.09 Obesity Preferred learning style: No preference indicated Learning readiness: Ready   NUTRITION ASSESSMENT   Anthropometrics  Ht: 5'2" Wt: Declined  Clinical Medical Hx: HTN, GERD, Transient Ischemic Attack Medications: Lisinopril, Metoprolol, Spironalactone Labs: N/A Notable Signs/Symptoms: N/A  Lifestyle & Dietary Hx Pt reports a cousin passing from breast cancer this past month. Pt went to get a mammogram and will need a biopsy this week to inspect a possible growth. Pt reports a strong family history of breast cancer. This is a significant source of stress. Pt reports eating out recently and splitting the meal in half and having the leftovers for lunch the next day. Pt would usually have eaten the whole meal and been over full. Pt is very proud of their ability to manage their impulse control. Pt reports stress over having to prepare food for themselves, mostly for lunch and dinner. Pt reports severe anxiety over making the choice of what to eat due to the potential for lower back pain from cooking. Pt reports standing for more than 5 minutes results in severe lower back pain. This pain discourages them from wanting to cook. Pt lost 18 pounds in the past and their lower back pain went away. Pt has been exercising on a home gym, doing upper body resistance training.   Estimated daily fluid intake: 120 oz Supplements: Vitamin C, Vitamin D, B12, Calcium Sleep: Does not sleep well, insomnia Stress / self-care: Weight Loss, Meal Planning Current average weekly physical activity: ADLs   24-Hr Dietary Recall First Meal: Special K with 2% milk, apple, glass of OJ Snack: carrots and lite ranch Second Meal: [place] chili, whole wheat crackers, grapefruit Snack: none Third Meal: Crunchy chicken breast, kale, corn Snack: handful of  pretzels and peanuts Beverages: OJ, Water   NUTRITION DIAGNOSIS  NB-1.1 Food and nutrition-related knowledge deficit As related to obesity.  As evidenced by BMI of 41.04 kg/m2, skipping meals, and self reported emotional eating.Marland Kitchen   NUTRITION INTERVENTION  Nutrition education (E-1) on the following topics:  Educated patient on the balanced plate eating model. Recommended lunch and dinner be 1/2 non-starchy vegetables, 1/4 starches, and 1/4 protein. Recommended breakfast be a balance of starch and protein with a piece of fruit. Discussed with patient the importance of working towards hitting the proportions of the balanced plate consistently. Counseled patient on ways to begin recognizing each of the food groups from the balanced plate in their own meals, and how close they are to fitting the recommended proportions of the balanced plate. Educated patient on the nutritional value of each food group on the balanced plate model. Counseled patient on beginning to rebuild their trust in themselves to make the right food choices for their health. Educated patient on mindful eating, including listening to their body's hunger and satiety cues, as well as eating slowly and allowing meals to be more of a sensory experience. Counseled patient on allowing themselves to be present in their emotions when they consider emotional eating. Advised patient to evaluate whether the impulse to eat is hunger based, or emotionally driven.   Handouts Provided Include   Balanced Plate  Balanced Plate Food List  Learning Style & Readiness for Change Teaching method utilized: Visual & Auditory  Demonstrated degree of understanding via: Teach Back  Barriers to learning/adherence to lifestyle change: none  Goals Established by Pt  Look into  http://www.tucker.net/ for plenty of heart healthy recipe ideas that are low in saturated fat and sodium.  Keep up the great work exercising on your home gym.   Work up to exercising  3-4 days a week, every other day.  Look into some local meal delivery services to try for the month of June.  -[employer]  -[employer]     MONITORING & EVALUATION Dietary intake, weekly physical activity, and impulse control in 6 weeks.  Next Steps  Patient is to follow up with RDN.

## 2021-04-17 ENCOUNTER — Ambulatory Visit
Admission: RE | Admit: 2021-04-17 | Discharge: 2021-04-17 | Disposition: A | Discharge status: Home / Self Care | Payer: No Typology Code available for payment source | Source: Ambulatory Visit | Attending: Obstetrics and Gynecology | Admitting: Obstetrics and Gynecology

## 2021-04-17 ENCOUNTER — Other Ambulatory Visit: Payer: Self-pay

## 2021-04-17 DIAGNOSIS — N6459 Other signs and symptoms in breast: Secondary | ICD-10-CM

## 2021-04-17 HISTORY — PX: BREAST BIOPSY: SHX20

## 2021-05-02 ENCOUNTER — Other Ambulatory Visit: Payer: Self-pay | Admitting: Family Medicine

## 2021-05-02 DIAGNOSIS — I10 Essential (primary) hypertension: Secondary | ICD-10-CM

## 2021-05-08 ENCOUNTER — Other Ambulatory Visit: Payer: Self-pay

## 2021-05-09 ENCOUNTER — Encounter: Payer: Self-pay | Admitting: Family Medicine

## 2021-05-09 ENCOUNTER — Ambulatory Visit (INDEPENDENT_AMBULATORY_CARE_PROVIDER_SITE_OTHER): Payer: No Typology Code available for payment source | Admitting: Family Medicine

## 2021-05-09 VITALS — BP 118/80 | HR 92 | Temp 96.3°F | Ht 62.0 in | Wt 228.2 lb

## 2021-05-09 DIAGNOSIS — L918 Other hypertrophic disorders of the skin: Secondary | ICD-10-CM

## 2021-05-09 DIAGNOSIS — D229 Melanocytic nevi, unspecified: Secondary | ICD-10-CM | POA: Diagnosis not present

## 2021-05-09 NOTE — Progress Notes (Signed)
[name] is a 50 y.o. female  Chief Complaint  Patient presents with   Skin Tag    Pt c/o moles starting to appear sometimes they get bigger in size.  Irritation when she puts on cloths    HPI: [name] is a 50 y.o. female patient seen today to discuss     Past Medical History:  Diagnosis Date   Allergy    Arthritis    Asthma    GERD (gastroesophageal reflux disease)    no meds   H/O: myomectomy    Hearing impaired    can hear on left side only   Heart murmur    Herpes genitalia    Hypertension    pulmonary   Refusal of blood transfusions as patient is Jehovah's Witness    TIA (transient ischemic attack)     Past Surgical History:  Procedure Laterality Date   BREAST EXCISIONAL BIOPSY  2012   CYSTOSCOPY  05/19/2012   Procedure: CYSTOSCOPY;  Surgeon: Alwyn Pea, MD;  Location: [facility];  Service: Gynecology;  Laterality: N/A;   MYOMECTOMY  2004     2010 robotic   OVARIAN CYST REMOVAL  2010   x2   ROBOTIC ASSISTED LAP VAGINAL HYSTERECTOMY     ROBOTIC ASSISTED LAPAROSCOPIC OVARIAN CYSTECTOMY Right 03/20/2015   Procedure: ROBOTIC ASSISTED LAPAROSCOPIC Salpingectomy OVARIAN CYSTECTOMY, pelvic washings, excision of peritoneal pseudocyst.;  Surgeon: Delsa Bern, MD;  Location: [facility];  Service: Gynecology;  Laterality: Right;    Social History   Socioeconomic History   Marital status: Single    Spouse name: Not on file   Number of children: Not on file   Years of education: Not on file   Highest education level: Not on file  Occupational History   Not on file  Tobacco Use   Smoking status: Never   Smokeless tobacco: Never  Substance and Sexual Activity   Alcohol use: No    Alcohol/week: 0.0 standard drinks   Drug use: No   Sexual activity: Yes    Partners: Male    Birth control/protection: Surgical    Comment: hyst  Other Topics Concern   Not on file  Social History Narrative   Exercise---walk, zumba, yoga, pole dancing class   Social  Determinants of Health   Financial Resource Strain: Not on file  Food Insecurity: Not on file  Transportation Needs: Not on file  Physical Activity: Not on file  Stress: Not on file  Social Connections: Not on file  Intimate Partner Violence: Not on file    Family History  Problem Relation Age of Onset   Asthma Mother    Arthritis Mother    Hypertension Father    Heart disease Maternal Grandmother        PCI   Cancer Maternal Grandmother 87       breast   Cancer Other 60       breast and possible colon   Migraines Sister    Diabetes Brother    Heart attack Paternal Grandfather        before age 50   Colon cancer Neg Hx    Colon polyps Neg Hx    Stomach cancer Neg Hx    Esophageal cancer Neg Hx      Immunization History  Administered Date(s) Administered   Influenza Split 12/25/2012, 09/09/2017   Influenza Whole 08/29/2009, 07/30/2011, 08/31/2012   Influenza,inj,Quad PF,6+ Mos 08/24/2013   Influenza-Unspecified 09/12/2015, 09/25/2016, 09/24/2020   PFIZER(Purple Top)SARS-COV-2  Vaccination 09/24/2020   Pneumococcal Polysaccharide-23 10/08/2010   Td 01/05/2008   Tdap 03/15/2018    Outpatient Encounter Medications as of 05/09/2021  Medication Sig Note   acetaminophen (TYLENOL) 650 MG CR tablet Take 650 mg by mouth every 8 (eight) hours as needed for pain.    albuterol (VENTOLIN HFA) 108 (90 Base) MCG/ACT inhaler TAKE 2 PUFFS BY MOUTH EVERY 6 HOURS AS NEEDED FOR WHEEZE    ALPRAZolam (XANAX) 0.25 MG tablet Take 1 tablet (0.25 mg total) by mouth 2 (two) times daily.    Ascorbic Acid (VITAMIN C) 1000 MG tablet Take 1,000 mg by mouth daily.    aspirin 325 MG tablet Take 1 tablet (325 mg total) by mouth daily.    cetirizine (ZYRTEC) 10 MG tablet Take 10 mg by mouth daily.    cholecalciferol (VITAMIN D) 1000 UNITS tablet Take 1,000 Units by mouth daily.    fluticasone (FLONASE) 50 MCG/ACT nasal spray Place 2 sprays into both nostrils daily.    lisinopril (ZESTRIL) 10 MG  tablet TAKE 1 TABLET BY MOUTH EVERY DAY    metoprolol tartrate (LOPRESSOR) 100 MG tablet TAKE 1 TABLET BY MOUTH TWICE A DAY    Multiple Vitamins-Calcium (ONE-A-DAY WOMENS PO) Take 1 tablet by mouth daily.    Rhubarb (ESTROVEN COMPLETE PO) Take by mouth.    spironolactone (ALDACTONE) 25 MG tablet TAKE 1 TABLET BY MOUTH EVERY DAY    valACYclovir (VALTREX) 500 MG tablet Take 500 mg by mouth daily.  03/07/2015: .   estradiol (VIVELLE-DOT) 0.0375 MG/24HR Place 1 patch onto the skin 2 (two) times a week. (Patient not taking: No sig reported) 10/21/2016: Received from: External Pharmacy   NON FORMULARY Adipolean twice a day for weight loss    No facility-administered encounter medications on file as of 05/09/2021.     ROS: Pertinent positives and negatives noted in HPI. Remainder of ROS non-contributory    Allergies  Allergen Reactions   Dust Mite Extract    Mold Extract [Trichophyton]    Tomato     BP 118/80 (BP Location: Left Arm, Patient Position: Sitting, Cuff Size: Normal)   Pulse 92   Temp (!) 96.3 F (35.7 C) (Temporal)   Ht 5\' 2"  (1.575 m)   Wt 228 lb 3.2 oz (103.5 kg)   LMP 04/18/2012   SpO2 98%   BMI 41.74 kg/m   Physical Exam Constitutional:      Appearance: Normal appearance.  Pulmonary:     Effort: No respiratory distress.  Skin:    Comments: Multiple nevi on face, abdomen. Axillary skin tag, skin tag on Lt breast   Neurological:     Mental Status: She is alert and oriented to person, place, and time.     A/P:  1. Multiple nevi - Ambulatory referral to Dermatology  2. Skin tag - Ambulatory referral to Dermatology     This visit occurred during the SARS-CoV-2 public health emergency.  Safety protocols were in place, including screening questions prior to the visit, additional usage of staff PPE, and extensive cleaning of exam room while observing appropriate contact time as indicated for disinfecting solutions.

## 2021-06-05 ENCOUNTER — Other Ambulatory Visit: Payer: Self-pay

## 2021-06-05 ENCOUNTER — Encounter: Payer: Self-pay | Admitting: Dietician

## 2021-06-05 ENCOUNTER — Encounter: Payer: No Typology Code available for payment source | Attending: Family Medicine | Admitting: Dietician

## 2021-06-05 NOTE — Patient Instructions (Addendum)
Talk to your primary care doctor about getting a referral for a sleep study to monitor you for sleep apnea.  Keep up the amazing job being physically active!!   Work to have a source of protein with breakfast every morning. Add in some greek yogurt or peanut butter to your oatmeal.

## 2021-06-05 NOTE — Progress Notes (Signed)
Medical Nutrition Therapy  Appointment Start time:  1600  Appointment End time:  70  Primary concerns today: Weight Loss  Referral diagnosis: E66.09 Obesity Preferred learning style: No preference indicated Learning readiness: Ready   NUTRITION ASSESSMENT   Anthropometrics  Ht: 5'2" Wt: 226.1 lbs Body mass index is 41.35 kg/m.   Clinical Medical Hx: HTN, GERD, Transient Ischemic Attack Medications: Lisinopril, Metoprolol, Spironalactone Labs: N/A Notable Signs/Symptoms: N/A  Lifestyle & Dietary Hx Pt reports some stressors in their personal life recently due to family health concerns.  Pt will going on vacation with their friends to [place] tomorrow. Pt is looking forward to being able to get away and have fun.  Pt purchased divided plates from [employer] to prepare their meals, states it has helped with portion control tremendously. Pt has increased their physical activity in their home gym. Pt is doing a lot of body weight, full body exercises. Pt reports noticing changes in their body shape and endurance. Pt states the increase in intensity of physical activity has been an immense stress relief. Pt reports their lower back pain has improved a little as well. Pt states their sleep is still not as good as they want it to be, states it is due to [place]. Pt reports snoring and has been told by others that they snore. Pt is considering asking about a sleep study.   Estimated daily fluid intake: 120 oz Supplements: Vitamin C, Vitamin D, B12, Calcium Sleep: Does not sleep well, insomnia Stress / self-care: Weight Loss, Meal Planning Current average weekly physical activity: ADLs   24-Hr Dietary Recall First Meal: Oatmeal w brown sugar, apple, piece of toast Snack: pretzels w/ PB Second Meal: [place] burger on pretzel bun w/ green peppers and onions, salad w/cabbage, kale, radish, bacon bits, slivered almonds Snack: Plum Third Meal: Air fried chicken wings, baked beans,  broccoli, zucchini and onions Snack: Peach Beverages:    NUTRITION DIAGNOSIS  NB-1.1 Food and nutrition-related knowledge deficit As related to obesity.  As evidenced by BMI of 41.04 kg/m2, skipping meals, and self reported emotional eating.Marland Kitchen   NUTRITION INTERVENTION  Nutrition education (E-1) on the following topics:  Educated patient on the balanced plate eating model. Recommended lunch and dinner be 1/2 non-starchy vegetables, 1/4 starches, and 1/4 protein. Recommended breakfast be a balance of starch and protein with a piece of fruit. Discussed with patient the importance of working towards hitting the proportions of the balanced plate consistently. Counseled patient on ways to begin recognizing each of the food groups from the balanced plate in their own meals, and how close they are to fitting the recommended proportions of the balanced plate. Educated patient on the nutritional value of each food group on the balanced plate model. Counseled patient on beginning to rebuild their trust in themselves to make the right food choices for their health. Educated patient on mindful eating, including listening to their body's hunger and satiety cues, as well as eating slowly and allowing meals to be more of a sensory experience. Counseled patient on allowing themselves to be present in their emotions when they consider emotional eating. Advised patient to evaluate whether the impulse to eat is hunger based, or emotionally driven.   Handouts Provided Include  Balanced Plate Balanced Plate Food List   Learning Style & Readiness for Change Teaching method utilized: Visual & Auditory  Demonstrated degree of understanding via: Teach Back  Barriers to learning/adherence to lifestyle change: none   Goals Established by Pt Talk  to your primary care doctor about getting a referral for a sleep study to monitor you for sleep apnea. Keep up the amazing job being physically active!!  Work to have a  source of protein with breakfast every morning. Add in some greek yogurt or peanut butter to your oatmeal.   MONITORING & EVALUATION Dietary intake, weekly physical activity, and impulse control in 3 months.  Next Steps  Patient is to follow up with RDN.

## 2021-07-08 ENCOUNTER — Other Ambulatory Visit: Payer: Self-pay

## 2021-07-08 DIAGNOSIS — J4520 Mild intermittent asthma, uncomplicated: Secondary | ICD-10-CM

## 2021-07-08 MED ORDER — ALBUTEROL SULFATE HFA 108 (90 BASE) MCG/ACT IN AERS: INHALATION_SPRAY | RESPIRATORY_TRACT | 0 refills | Status: DC

## 2021-07-08 NOTE — Addendum Note (Signed)
Addended by: Lynda Rainwater on: 07/08/2021 02:33 PM   Modules accepted: Orders

## 2021-08-07 ENCOUNTER — Other Ambulatory Visit: Payer: Self-pay

## 2021-08-07 DIAGNOSIS — I10 Essential (primary) hypertension: Secondary | ICD-10-CM

## 2021-08-07 MED ORDER — METOPROLOL TARTRATE 100 MG PO TABS: 100.0000 mg | ORAL_TABLET | Freq: Two times a day (BID) | ORAL | 0 refills | Status: DC

## 2021-08-07 MED ORDER — SPIRONOLACTONE 25 MG PO TABS: 25.0000 mg | ORAL_TABLET | Freq: Every day | ORAL | 0 refills | Status: DC

## 2021-08-07 NOTE — Telephone Encounter (Signed)
Refill request for Spironolactone 25 mg

## 2021-08-07 NOTE — Telephone Encounter (Signed)
Refill request for: Metoprolol Tart 100 mg LR 05/02/21, #180, 0 rf LOV 05/09/21 FOV none scheduled.   Please review and advise.  Thanks. Dm/cma

## 2021-08-09 ENCOUNTER — Other Ambulatory Visit: Payer: Self-pay

## 2021-08-09 DIAGNOSIS — F419 Anxiety disorder, unspecified: Secondary | ICD-10-CM

## 2021-08-09 NOTE — Telephone Encounter (Signed)
Refill request for: Alprazolam 0.25 mg LR 12/26/20, #60, 2 rf LOV 05/09/21 FOV none scheduled.    Please review and advise.  Thanks. Dm/cma

## 2021-08-11 MED ORDER — ALPRAZOLAM 0.25 MG PO TABS: 0.2500 mg | ORAL_TABLET | Freq: Two times a day (BID) | ORAL | 2 refills | Status: DC

## 2021-08-12 ENCOUNTER — Other Ambulatory Visit: Payer: Self-pay

## 2021-08-12 ENCOUNTER — Encounter: Payer: No Typology Code available for payment source | Attending: Family Medicine | Admitting: Dietician

## 2021-08-12 ENCOUNTER — Encounter: Payer: Self-pay | Admitting: Dietician

## 2021-08-12 NOTE — Patient Instructions (Addendum)
Keep up the great work with your physical activity. Look to join Zumba and Yoga classes at [employer]. Remember how great these exercises make you feel!!  Try low-fat Wheat thins with natural peanut butter for a good balanced snack with some extra fiber.  Continue to eat your meals using the divided plate!  Great job getting to a good level of sleep, continue to work on consistently getting 6-7 hours each night!

## 2021-08-12 NOTE — Progress Notes (Signed)
Medical Nutrition Therapy  Appointment Start time:  1650  Appointment End time:  1730  Primary concerns today: Weight Loss  Referral diagnosis: E66.09 Obesity Preferred learning style: No preference indicated Learning readiness: Ready   NUTRITION ASSESSMENT   Anthropometrics  Ht: 5'2" Wt: 224.1 lbs Body mass index is 40.99 kg/m.   Clinical Medical Hx: HTN, GERD, Transient Ischemic Attack Medications: Lisinopril, Metoprolol, Spironalactone Labs: N/A Notable Signs/Symptoms: N/A  Lifestyle & Dietary Hx Pt reports their mother had a stroke recently and it has been very stressful. Pt has been exercising to battle the stress, states it has helped tremendously. Pt reports getting a heating procedure to "melt the fat" from their body. Pt states they went on a fat-free, low sugar, low salt, high protein diet for 24 hours after the procedure, as well as a detox shake. Pt lost 8 pounds as a result of this course of treatment. Pt states it also gave them a jump start towards continuing weight loss and has increased their energy levels.  Pt just joined the gym through their job's wellness program and will be starting group fitness classes. Pt is sleeping 6+ hours a night now.  Pt reports a goal getting back to into a size 12. Pt is beginning to keep snacks with them to prevent from buying junk food while away from home.   Estimated daily fluid intake: 120 oz Supplements: Vitamin C, Vitamin D, B12, Calcium Sleep: Improved to ~6 hours a night Stress / self-care: Weight Loss, Meal Planning Current average weekly physical activity: ADL, exercises at gym   24-Hr Dietary Recall First Meal: [employer] roasted [place], fried egg with salsa, pear  Snack: Yogurt drink Second Meal: None Snack: Mixed nuts Third Meal: Hibachi chicken and shrimp with rice and vegetables Snack: Peach Beverages:    NUTRITION DIAGNOSIS  NB-1.1 Food and nutrition-related knowledge deficit As related to obesity.  As  evidenced by BMI of 41.04 kg/m2, skipping meals, and self reported emotional eating.Marland Kitchen   NUTRITION INTERVENTION  Nutrition education (E-1) on the following topics:  Educated patient on the balanced plate eating model. Recommended lunch and dinner be 1/2 non-starchy vegetables, 1/4 starches, and 1/4 protein. Recommended breakfast be a balance of starch and protein with a piece of fruit. Discussed with patient the importance of working towards hitting the proportions of the balanced plate consistently. Counseled patient on ways to begin recognizing each of the food groups from the balanced plate in their own meals, and how close they are to fitting the recommended proportions of the balanced plate. Educated patient on the nutritional value of each food group on the balanced plate model. Counseled patient on beginning to rebuild their trust in themselves to make the right food choices for their health. Educated patient on mindful eating, including listening to their body's hunger and satiety cues, as well as eating slowly and allowing meals to be more of a sensory experience. Counseled patient on allowing themselves to be present in their emotions when they consider emotional eating. Advised patient to evaluate whether the impulse to eat is hunger based, or emotionally driven.   Handouts Provided Include  Balanced Plate Balanced Plate Food List   Learning Style & Readiness for Change Teaching method utilized: Visual & Auditory  Demonstrated degree of understanding via: Teach Back  Barriers to learning/adherence to lifestyle change: none   Goals Established by Pt Keep up the great work with your physical activity. Look to join Zumba and Yoga classes at [employer]. Remember how  great these exercises make you feel!! Try low-fat Wheat thins with natural peanut butter for a good balanced snack with some extra fiber. Continue to eat your meals using the divided plate! Great job getting to a good level  of sleep, continue to work on consistently getting 6-7 hours each night!   MONITORING & EVALUATION Dietary intake, weekly physical activity, and impulse control in 2 months.  Next Steps  Patient is to follow up with RDN.

## 2021-09-26 ENCOUNTER — Other Ambulatory Visit: Payer: Self-pay

## 2021-09-26 DIAGNOSIS — I10 Essential (primary) hypertension: Secondary | ICD-10-CM

## 2021-09-26 MED ORDER — LISINOPRIL 10 MG PO TABS: 10.0000 mg | ORAL_TABLET | Freq: Every day | ORAL | 0 refills | Status: DC

## 2021-09-26 NOTE — Telephone Encounter (Signed)
Refill request for: Lisinopril 10 mg LR 10/01/20, #90, 3 rf LOV 05/09/21 FOV  none scheduled.    Please review and advise.  Thanks.  Dm/cma

## 2021-10-15 ENCOUNTER — Ambulatory Visit: Payer: No Typology Code available for payment source | Admitting: Dietician

## 2021-11-03 ENCOUNTER — Other Ambulatory Visit: Payer: Self-pay | Admitting: Family

## 2021-11-03 DIAGNOSIS — I10 Essential (primary) hypertension: Secondary | ICD-10-CM

## 2021-11-09 ENCOUNTER — Encounter: Payer: Self-pay | Admitting: Nurse Practitioner

## 2021-11-09 DIAGNOSIS — I10 Essential (primary) hypertension: Secondary | ICD-10-CM

## 2021-11-15 ENCOUNTER — Other Ambulatory Visit (INDEPENDENT_AMBULATORY_CARE_PROVIDER_SITE_OTHER): Payer: No Typology Code available for payment source

## 2021-11-15 ENCOUNTER — Other Ambulatory Visit: Payer: Self-pay

## 2021-11-15 DIAGNOSIS — I10 Essential (primary) hypertension: Secondary | ICD-10-CM

## 2021-11-15 LAB — BASIC METABOLIC PANEL
BUN: 10 mg/dL (ref 6–23)
CO2: 27 mEq/L (ref 19–32)
Calcium: 9.6 mg/dL (ref 8.4–10.5)
Chloride: 106 mEq/L (ref 96–112)
Creatinine, Ser: 0.95 mg/dL (ref 0.40–1.20)
GFR: 70.07 mL/min (ref >60.00)
Glucose, Bld: 91 mg/dL (ref 70–99)
Potassium: 3.9 mEq/L (ref 3.5–5.1)
Sodium: 140 mEq/L (ref 135–145)

## 2021-11-15 NOTE — Progress Notes (Signed)
Per the orders orders or NP Wilfred Lacy pt is here for labs pt tolerated draw well.

## 2021-11-17 ENCOUNTER — Encounter: Payer: Self-pay | Admitting: Nurse Practitioner

## 2021-11-17 DIAGNOSIS — I10 Essential (primary) hypertension: Secondary | ICD-10-CM

## 2021-11-19 NOTE — Telephone Encounter (Signed)
Refill request for the following meds:  Metoprolol   LR 08/07/21,  #180, 0 rf  Spirolactone  LR 08/07/21, #90, 0 rf  Lisinopril  LR 09/26/21, #90 0 rf LOV 05/09/21 FOV 01/23/22  Please review and advise.  Thanks. Dm/cma

## 2021-11-20 MED ORDER — METOPROLOL TARTRATE 100 MG PO TABS: 100.0000 mg | ORAL_TABLET | Freq: Two times a day (BID) | ORAL | 0 refills | Status: DC

## 2021-11-20 MED ORDER — LISINOPRIL 10 MG PO TABS: 10.0000 mg | ORAL_TABLET | Freq: Every day | ORAL | 0 refills | Status: DC

## 2021-11-20 MED ORDER — SPIRONOLACTONE 25 MG PO TABS: 25.0000 mg | ORAL_TABLET | Freq: Every day | ORAL | 0 refills | Status: DC

## 2021-12-21 ENCOUNTER — Other Ambulatory Visit: Payer: Self-pay | Admitting: Obstetrics and Gynecology

## 2021-12-21 DIAGNOSIS — R921 Mammographic calcification found on diagnostic imaging of breast: Secondary | ICD-10-CM

## 2022-01-07 ENCOUNTER — Other Ambulatory Visit: Payer: Self-pay | Admitting: Obstetrics and Gynecology

## 2022-01-07 DIAGNOSIS — R928 Other abnormal and inconclusive findings on diagnostic imaging of breast: Secondary | ICD-10-CM

## 2022-01-22 NOTE — Progress Notes (Signed)
Established Patient Office Visit  Subjective:  Patient ID: [name], female    DOB: 1971-05-09  Age: 51 y.o. MRN: [id number]  CC:  Chief Complaint  Patient presents with   Establish Care    Medication refills    HPI [name] presents to transfer care to a new provider.  Introduced to Designer, jewellery role and practice setting.  All questions answered.  Discussed provider/patient relationship and expectations.  HYPERTENSION  Hypertension status: controlled  Satisfied with current treatment? yes Duration of hypertension: chronic BP monitoring frequency:  daily BP range: 110s-120 BP medication side effects:  no Medication compliance: excellent compliance Previous BP meds: lisionpril, lopressor, sprionolocatone  Aspirin: yes Recurrent headaches: no Visual changes: no Palpitations: no Dyspnea: no Chest pain: no Lower extremity edema:  sometimes in her ankles Dizzy/lightheaded: no   Past Medical History:  Diagnosis Date   Allergy    Arthritis    Asthma    GERD (gastroesophageal reflux disease)    no meds   H/O: myomectomy    Hearing impaired    can hear on left side only   Heart murmur    Herpes genitalia    Hypertension    pulmonary   Refusal of blood transfusions as patient is Jehovah's Witness    TIA (transient ischemic attack)     Past Surgical History:  Procedure Laterality Date   BREAST EXCISIONAL BIOPSY  2012   CYSTOSCOPY  05/19/2012   Procedure: CYSTOSCOPY;  Surgeon: Alwyn Pea, MD;  Location: [facility];  Service: Gynecology;  Laterality: N/A;   MYOMECTOMY  2004     2010 robotic   OVARIAN CYST REMOVAL  2010   x2   ROBOTIC ASSISTED LAP VAGINAL HYSTERECTOMY     ROBOTIC ASSISTED LAPAROSCOPIC OVARIAN CYSTECTOMY Right 03/20/2015   Procedure: ROBOTIC ASSISTED LAPAROSCOPIC Salpingectomy OVARIAN CYSTECTOMY, pelvic washings, excision of peritoneal pseudocyst.;  Surgeon: Delsa Bern, MD;  Location: [facility];  Service: Gynecology;  Laterality:  Right;    Family History  Problem Relation Age of Onset   Asthma Mother    Arthritis Mother    Hypertension Father    Heart disease Maternal Grandmother        PCI   Cancer Maternal Grandmother 65       breast   Cancer Other 60       breast and possible colon   Migraines Sister    Diabetes Brother    Heart attack Paternal Grandfather        before age 76   Colon cancer Neg Hx    Colon polyps Neg Hx    Stomach cancer Neg Hx    Esophageal cancer Neg Hx     Social History   Socioeconomic History   Marital status: Single    Spouse name: Not on file   Number of children: Not on file   Years of education: Not on file   Highest education level: Not on file  Occupational History   Not on file  Tobacco Use   Smoking status: Never   Smokeless tobacco: Never  Vaping Use   Vaping Use: Never used  Substance and Sexual Activity   Alcohol use: No    Alcohol/week: 0.0 standard drinks   Drug use: No   Sexual activity: Yes    Partners: Male    Birth control/protection: Surgical    Comment: hyst  Other Topics Concern   Not on file  Social History Narrative   Not on file  Social Determinants of Health   Financial Resource Strain: Not on file  Food Insecurity: Not on file  Transportation Needs: Not on file  Physical Activity: Not on file  Stress: Not on file  Social Connections: Not on file  Intimate Partner Violence: Not on file    Outpatient Medications Prior to Visit  Medication Sig Dispense Refill   acetaminophen (TYLENOL) 650 MG CR tablet Take 650 mg by mouth every 8 (eight) hours as needed for pain.     ALPRAZolam (XANAX) 0.25 MG tablet Take 1 tablet (0.25 mg total) by mouth 2 (two) times daily. 60 tablet 2   Ascorbic Acid (VITAMIN C) 1000 MG tablet Take 1,000 mg by mouth daily.     aspirin 325 MG tablet Take 1 tablet (325 mg total) by mouth daily.     cetirizine (ZYRTEC) 10 MG tablet Take 10 mg by mouth daily.     cholecalciferol (VITAMIN D) 1000 UNITS tablet  Take 1,000 Units by mouth daily.     fluticasone (FLONASE) 50 MCG/ACT nasal spray Place 2 sprays into both nostrils daily.  2   Multiple Vitamins-Calcium (ONE-A-DAY WOMENS PO) Take 1 tablet by mouth daily.     NON FORMULARY Adipolean twice a day for weight loss     Rhubarb (ESTROVEN COMPLETE PO) Take by mouth.     vitamin B-12 (CYANOCOBALAMIN) 1000 MCG tablet Take 1,000 mcg by mouth daily.     albuterol (VENTOLIN HFA) 108 (90 Base) MCG/ACT inhaler TAKE 2 PUFFS BY MOUTH EVERY 6 HOURS AS NEEDED FOR WHEEZE 8 g 0   lisinopril (ZESTRIL) 10 MG tablet Take 1 tablet (10 mg total) by mouth daily. 90 tablet 0   metoprolol tartrate (LOPRESSOR) 100 MG tablet Take 1 tablet (100 mg total) by mouth 2 (two) times daily. 180 tablet 0   spironolactone (ALDACTONE) 25 MG tablet Take 1 tablet (25 mg total) by mouth daily. 90 tablet 0   valACYclovir (VALTREX) 500 MG tablet Take 500 mg by mouth daily.      No facility-administered medications prior to visit.    Allergies  Allergen Reactions   Dust Mite Extract    Mold Extract [Trichophyton]    Tomato     ROS Review of Systems  Constitutional:  Positive for fatigue.  HENT: Negative.    Eyes: Negative.   Respiratory: Negative.    Cardiovascular: Negative.   Gastrointestinal: Negative.   Genitourinary: Negative.   Musculoskeletal: Negative.   Skin: Negative.   Neurological: Negative.   Psychiatric/Behavioral: Negative.       Objective:    Physical Exam Vitals and nursing note reviewed.  Constitutional:      General: She is not in acute distress.    Appearance: Normal appearance.  HENT:     Head: Normocephalic and atraumatic.     Right Ear: Tympanic membrane, ear canal and external ear normal.     Left Ear: Tympanic membrane, ear canal and external ear normal.     Nose: Nose normal.     Mouth/Throat:     Mouth: Mucous membranes are moist.     Pharynx: Oropharynx is clear.  Eyes:     Conjunctiva/sclera: Conjunctivae normal.     Pupils:  Pupils are equal, round, and reactive to light.  Cardiovascular:     Rate and Rhythm: Normal rate and regular rhythm.     Pulses: Normal pulses.     Heart sounds: Normal heart sounds.  Pulmonary:     Effort: Pulmonary effort is normal.  Breath sounds: Normal breath sounds.  Abdominal:     General: Bowel sounds are normal.     Palpations: Abdomen is soft.     Tenderness: There is no abdominal tenderness.  Musculoskeletal:        General: Normal range of motion.     Cervical back: Normal range of motion and neck supple. No tenderness.  Lymphadenopathy:     Cervical: No cervical adenopathy.  Skin:    General: Skin is warm and dry.  Neurological:     General: No focal deficit present.     Mental Status: She is alert and oriented to person, place, and time.  Psychiatric:        Mood and Affect: Mood normal.        Behavior: Behavior normal.        Thought Content: Thought content normal.        Judgment: Judgment normal.    BP 118/74    Pulse 74    Temp 98.1 F (36.7 C)    Resp 16    Ht 5\' 2"  (1.575 m)    Wt 217 lb 3.2 oz (98.5 kg)    LMP 04/18/2012    SpO2 97%    BMI 39.73 kg/m  Wt Readings from Last 3 Encounters:  01/23/22 217 lb 3.2 oz (98.5 kg)  08/12/21 224 lb 1.6 oz (101.7 kg)  06/05/21 226 lb 1.6 oz (102.6 kg)     Health Maintenance Due  Topic Date Due   DEXA SCAN  06/06/2016   PAP SMEAR-Modifier  07/23/2020    There are no preventive care reminders to display for this patient.  Lab Results  Component Value Date   TSH 1.10 03/15/2018   Lab Results  Component Value Date   WBC 8.5 06/06/2020   HGB 12.9 06/06/2020   HCT 38.8 06/06/2020   MCV 91.6 06/06/2020   PLT 319.0 06/06/2020   Lab Results  Component Value Date   NA 140 11/15/2021   K 3.9 11/15/2021   CO2 27 11/15/2021   GLUCOSE 91 11/15/2021   BUN 10 11/15/2021   CREATININE 0.95 11/15/2021   BILITOT 0.3 03/15/2018   ALKPHOS 61 03/15/2018   AST 20 06/06/2020   ALT 33 06/06/2020   PROT 7.1  03/15/2018   ALBUMIN 4.0 03/15/2018   CALCIUM 9.6 11/15/2021   ANIONGAP 12 08/17/2019   GFR 70.07 11/15/2021   Lab Results  Component Value Date   CHOL 149 06/06/2020   Lab Results  Component Value Date   HDL 45.10 06/06/2020   Lab Results  Component Value Date   LDLCALC 75 06/06/2020   Lab Results  Component Value Date   TRIG 145.0 06/06/2020   Lab Results  Component Value Date   CHOLHDL 3 06/06/2020   Lab Results  Component Value Date   HGBA1C 5.1 11/10/2012      Assessment & Plan:   Problem List Items Addressed This Visit       Cardiovascular and Mediastinum   Essential hypertension - Primary (Chronic)    Chronic, stable.  We will continue current regimen of lisinopril 10 mg daily, metoprolol tartrate 100 mg twice daily, spironolactone 25 mg daily.  Refill sent to the pharmacy.  Check CMP, CBC.  Follow-up in 6 months.      Relevant Medications   lisinopril (ZESTRIL) 10 MG tablet   metoprolol tartrate (LOPRESSOR) 100 MG tablet   spironolactone (ALDACTONE) 25 MG tablet   Other Relevant Orders   CBC  Comprehensive metabolic panel   TIA (transient ischemic attack)    History of TIA, symptoms have resolved.  Since then she has been taking aspirin 325 mg daily.  We will continue this.  Check lipid panel today and consider statin in the future.      Relevant Medications   lisinopril (ZESTRIL) 10 MG tablet   metoprolol tartrate (LOPRESSOR) 100 MG tablet   spironolactone (ALDACTONE) 25 MG tablet     Respiratory   Mild intermittent asthma    Chronic, stable.  She uses albuterol inhaler as needed for symptoms.  She states that she rarely needs to use this.  Follow-up if symptoms worsen or with any concerns.      Relevant Medications   albuterol (VENTOLIN HFA) 108 (90 Base) MCG/ACT inhaler     Genitourinary   Herpes genitalia    Chronic, stable.  She takes Valtrex 500 mg daily for suppression.  She has not had any recent flares.  Refill sent to the  pharmacy.      Relevant Medications   valACYclovir (VALTREX) 500 MG tablet     Other   Obesity (BMI 30-39.9)    BMI 39.7.  Discussed diet and exercise.  Also discussed weight circumference and how that can be a useful measurement for abdominal fat.  Goal is to lose 1 to 2 pounds per week.  Patient states her goal is to lose about 30 pounds.      Other Visit Diagnoses     Encounter for lipid screening for cardiovascular disease       Screen lipid panel today and treat based on results   Relevant Orders   Lipid panel   Need for shingles vaccine       Shingrix # 1 given today   Relevant Orders   Varicella-zoster vaccine IM (Completed)       Meds ordered this encounter  Medications   lisinopril (ZESTRIL) 10 MG tablet    Sig: Take 1 tablet (10 mg total) by mouth daily.    Dispense:  90 tablet    Refill:  1   metoprolol tartrate (LOPRESSOR) 100 MG tablet    Sig: Take 1 tablet (100 mg total) by mouth 2 (two) times daily.    Dispense:  180 tablet    Refill:  1   spironolactone (ALDACTONE) 25 MG tablet    Sig: Take 1 tablet (25 mg total) by mouth daily.    Dispense:  90 tablet    Refill:  1   valACYclovir (VALTREX) 500 MG tablet    Sig: Take 1 tablet (500 mg total) by mouth daily.    Dispense:  90 tablet    Refill:  1   albuterol (VENTOLIN HFA) 108 (90 Base) MCG/ACT inhaler    Sig: TAKE 2 PUFFS BY MOUTH EVERY 6 HOURS AS NEEDED FOR WHEEZE    Dispense:  8 g    Refill:  2    Follow-up: Return in about 6 months (around 07/26/2022) for HTN.    Charyl Dancer, NP

## 2022-01-23 ENCOUNTER — Encounter: Payer: No Typology Code available for payment source | Admitting: Nurse Practitioner

## 2022-01-23 ENCOUNTER — Encounter: Payer: Self-pay | Admitting: Nurse Practitioner

## 2022-01-23 ENCOUNTER — Other Ambulatory Visit: Payer: No Typology Code available for payment source | Admitting: Nurse Practitioner

## 2022-01-23 ENCOUNTER — Other Ambulatory Visit: Payer: Self-pay

## 2022-01-23 ENCOUNTER — Ambulatory Visit (INDEPENDENT_AMBULATORY_CARE_PROVIDER_SITE_OTHER): Payer: No Typology Code available for payment source | Admitting: Nurse Practitioner

## 2022-01-23 VITALS — BP 118/74 | HR 74 | Temp 98.1°F | Resp 16 | Ht 62.0 in | Wt 217.2 lb

## 2022-01-23 DIAGNOSIS — G459 Transient cerebral ischemic attack, unspecified: Secondary | ICD-10-CM

## 2022-01-23 DIAGNOSIS — E669 Obesity, unspecified: Secondary | ICD-10-CM

## 2022-01-23 DIAGNOSIS — Z13220 Encounter for screening for lipoid disorders: Secondary | ICD-10-CM | POA: Diagnosis not present

## 2022-01-23 DIAGNOSIS — Z23 Encounter for immunization: Secondary | ICD-10-CM

## 2022-01-23 DIAGNOSIS — I10 Essential (primary) hypertension: Secondary | ICD-10-CM | POA: Diagnosis not present

## 2022-01-23 DIAGNOSIS — J4520 Mild intermittent asthma, uncomplicated: Secondary | ICD-10-CM

## 2022-01-23 DIAGNOSIS — Z136 Encounter for screening for cardiovascular disorders: Secondary | ICD-10-CM | POA: Diagnosis not present

## 2022-01-23 DIAGNOSIS — A6000 Herpesviral infection of urogenital system, unspecified: Secondary | ICD-10-CM

## 2022-01-23 MED ORDER — ALBUTEROL SULFATE HFA 108 (90 BASE) MCG/ACT IN AERS: INHALATION_SPRAY | RESPIRATORY_TRACT | 2 refills | Status: DC

## 2022-01-23 MED ORDER — SPIRONOLACTONE 25 MG PO TABS: 25.0000 mg | ORAL_TABLET | Freq: Every day | ORAL | 1 refills | Status: DC

## 2022-01-23 MED ORDER — METOPROLOL TARTRATE 100 MG PO TABS: 100.0000 mg | ORAL_TABLET | Freq: Two times a day (BID) | ORAL | 1 refills | Status: DC

## 2022-01-23 MED ORDER — VALACYCLOVIR HCL 500 MG PO TABS: 500.0000 mg | ORAL_TABLET | Freq: Every day | ORAL | 1 refills | Status: DC

## 2022-01-23 MED ORDER — LISINOPRIL 10 MG PO TABS: 10.0000 mg | ORAL_TABLET | Freq: Every day | ORAL | 1 refills | Status: DC

## 2022-01-23 NOTE — Assessment & Plan Note (Signed)
History of TIA, symptoms have resolved.  Since then she has been taking aspirin 325 mg daily.  We will continue this.  Check lipid panel today and consider statin in the future. ?

## 2022-01-23 NOTE — Assessment & Plan Note (Signed)
Chronic, stable.  She uses albuterol inhaler as needed for symptoms.  She states that she rarely needs to use this.  Follow-up if symptoms worsen or with any concerns. ?

## 2022-01-23 NOTE — Patient Instructions (Signed)
It was great to see you! ? ?I have sent refills on your medications. ? ?We are checking your labs and will send the results to mychart. ? ?Let's follow-up in 6 months, sooner if you have concerns. ? ?If a referral was placed today, you will be contacted for an appointment. Please note that routine referrals can sometimes take up to 3-4 weeks to process. Please call our office if you haven't heard anything after this time frame. ? ?Take care, ? ?Vance Peper, NP ? ?

## 2022-01-23 NOTE — Assessment & Plan Note (Signed)
Chronic, stable.  She takes Valtrex 500 mg daily for suppression.  She has not had any recent flares.  Refill sent to the pharmacy. ?

## 2022-01-23 NOTE — Assessment & Plan Note (Signed)
BMI 39.7.  Discussed diet and exercise.  Also discussed weight circumference and how that can be a useful measurement for abdominal fat.  Goal is to lose 1 to 2 pounds per week.  Patient states her goal is to lose about 30 pounds. ?

## 2022-01-23 NOTE — Assessment & Plan Note (Signed)
Chronic, stable.  We will continue current regimen of lisinopril 10 mg daily, metoprolol tartrate 100 mg twice daily, spironolactone 25 mg daily.  Refill sent to the pharmacy.  Check CMP, CBC.  Follow-up in 6 months. ?

## 2022-01-24 LAB — COMPREHENSIVE METABOLIC PANEL
ALT: 16 U/L (ref 0–35)
AST: 19 U/L (ref 0–37)
Albumin: 4.2 g/dL (ref 3.5–5.2)
Alkaline Phosphatase: 68 U/L (ref 39–117)
BUN: 14 mg/dL (ref 6–23)
CO2: 28 mEq/L (ref 19–32)
Calcium: 9.7 mg/dL (ref 8.4–10.5)
Chloride: 103 mEq/L (ref 96–112)
Creatinine, Ser: 0.93 mg/dL (ref 0.40–1.20)
GFR: 71.78 mL/min (ref >60.00)
Glucose, Bld: 81 mg/dL (ref 70–99)
Potassium: 4.3 mEq/L (ref 3.5–5.1)
Sodium: 139 mEq/L (ref 135–145)
Total Bilirubin: 0.3 mg/dL (ref 0.2–1.2)
Total Protein: 7.4 g/dL (ref 6.0–8.3)

## 2022-01-24 LAB — LIPID PANEL
Cholesterol: 147 mg/dL (ref 0–200)
HDL: 44.7 mg/dL (ref >39.00)
LDL Cholesterol: 80 mg/dL (ref 0–99)
NonHDL: 101.81
Total CHOL/HDL Ratio: 3
Triglycerides: 109 mg/dL (ref 0.0–149.0)
VLDL: 21.8 mg/dL (ref 0.0–40.0)

## 2022-01-24 LAB — CBC
HCT: 38.2 % (ref 36.0–46.0)
Hemoglobin: 12.4 g/dL (ref 12.0–15.0)
MCHC: 32.5 g/dL (ref 30.0–36.0)
MCV: 91.7 fl (ref 78.0–100.0)
Platelets: 347 10*3/uL (ref 150.0–400.0)
RBC: 4.17 Mil/uL (ref 3.87–5.11)
RDW: 13.8 % (ref 11.5–15.5)
WBC: 8.8 10*3/uL (ref 4.0–10.5)

## 2022-01-24 NOTE — Telephone Encounter (Signed)
Chart supports rx refill ?Last ov: 01/24/2022 ? ?

## 2022-01-28 ENCOUNTER — Ambulatory Visit
Admission: RE | Admit: 2022-01-28 | Discharge: 2022-01-28 | Disposition: A | Discharge status: Home / Self Care | Payer: No Typology Code available for payment source | Source: Ambulatory Visit | Attending: Obstetrics and Gynecology | Admitting: Obstetrics and Gynecology

## 2022-01-28 ENCOUNTER — Other Ambulatory Visit: Payer: Self-pay | Admitting: Obstetrics and Gynecology

## 2022-01-28 DIAGNOSIS — R921 Mammographic calcification found on diagnostic imaging of breast: Secondary | ICD-10-CM

## 2022-01-28 DIAGNOSIS — R928 Other abnormal and inconclusive findings on diagnostic imaging of breast: Secondary | ICD-10-CM

## 2022-02-25 ENCOUNTER — Encounter: Payer: Self-pay | Admitting: Nurse Practitioner

## 2022-03-05 ENCOUNTER — Ambulatory Visit
Admission: RE | Admit: 2022-03-05 | Discharge: 2022-03-05 | Disposition: A | Discharge status: Home / Self Care | Payer: No Typology Code available for payment source | Source: Ambulatory Visit | Attending: Obstetrics and Gynecology | Admitting: Obstetrics and Gynecology

## 2022-03-05 DIAGNOSIS — R921 Mammographic calcification found on diagnostic imaging of breast: Secondary | ICD-10-CM

## 2022-03-10 ENCOUNTER — Encounter: Payer: Self-pay | Admitting: Nurse Practitioner

## 2022-03-10 DIAGNOSIS — F419 Anxiety disorder, unspecified: Secondary | ICD-10-CM

## 2022-03-10 MED ORDER — ALPRAZOLAM 0.25 MG PO TABS: 0.2500 mg | ORAL_TABLET | Freq: Two times a day (BID) | ORAL | 2 refills | Status: DC

## 2022-03-10 NOTE — Telephone Encounter (Signed)
Refill request for  ?Alprazolam  0.25 mg ?LR 08/11/21, #60, 2 rf ?LOV 01/23/22 ?FOV 08/06/22 ? ?Please review and advise.  ?Thanks.  Dm/cma ? ?

## 2022-03-25 ENCOUNTER — Ambulatory Visit (INDEPENDENT_AMBULATORY_CARE_PROVIDER_SITE_OTHER): Payer: No Typology Code available for payment source

## 2022-03-25 DIAGNOSIS — Z23 Encounter for immunization: Secondary | ICD-10-CM

## 2022-03-25 NOTE — Progress Notes (Signed)
Per orders of Vance Peper , pt is here for 2nd shingles dose. pt received IM in Left Deltoid at 1507. Given by Leonor Liv, CMA. Pt tolerated inj well.  ? ?

## 2022-05-01 ENCOUNTER — Ambulatory Visit: Payer: No Typology Code available for payment source

## 2022-05-06 ENCOUNTER — Ambulatory Visit: Payer: No Typology Code available for payment source

## 2022-06-04 ENCOUNTER — Encounter: Payer: Self-pay | Admitting: Nurse Practitioner

## 2022-06-04 DIAGNOSIS — L918 Other hypertrophic disorders of the skin: Secondary | ICD-10-CM

## 2022-07-21 ENCOUNTER — Encounter: Payer: Self-pay | Admitting: Nurse Practitioner

## 2022-07-22 ENCOUNTER — Other Ambulatory Visit: Payer: Self-pay | Admitting: Nurse Practitioner

## 2022-07-22 DIAGNOSIS — I10 Essential (primary) hypertension: Secondary | ICD-10-CM

## 2022-08-05 NOTE — Progress Notes (Unsigned)
[facility] [address] [place] [address] Dept: [phone] Dept Fax: [phone]  Virtual Video Visit  I connected with [name] on 08/06/22 at  4:00 PM EDT by a video enabled telemedicine application and verified that I am speaking with the correct person using two identifiers.  Location patient: Home Location provider: Clinic Persons participating in the virtual visit: Patient; Dawn Peper, NP; Marchia Bond, CMA  I discussed the limitations of evaluation and management by telemedicine and the availability of in person appointments. The patient expressed understanding and agreed to proceed.  Chief Complaint  Patient presents with   Follow-up    6 mo f/u HTN BP range 120/60-130/70    SUBJECTIVE:  HPI: [name] is a 51 y.o. female who presents to follow-up on hypertension. Her blood pressure has been running 118-120/70s at home. She denies chest pain, shortness of breath and headaches. She has been trying to go to the gym 3 times per week and losing weight so she can hopefully come off some of her medications.    Patient Active Problem List   Diagnosis Date Noted   TIA (transient ischemic attack) 11/09/2012   Herpes genitalia    PULMONARY HYPERTENSION 11/06/2010   Obesity (BMI 30-39.9) 12/04/2008   Essential hypertension 10/22/2007   HPV 03/24/2007   LOSS, HEARING NOS 03/24/2007   Mild intermittent asthma 03/24/2007    Past Surgical History:  Procedure Laterality Date   BREAST BIOPSY Left 04/17/2021   BREAST EXCISIONAL BIOPSY  2012   CYSTOSCOPY  05/19/2012   Procedure: CYSTOSCOPY;  Surgeon: Alwyn Pea, MD;  Location: [facility];  Service: Gynecology;  Laterality: N/A;   MYOMECTOMY  2004   2010 robotic   OVARIAN CYST REMOVAL  2010   x2   ROBOTIC ASSISTED LAP VAGINAL HYSTERECTOMY     ROBOTIC ASSISTED LAPAROSCOPIC OVARIAN CYSTECTOMY Right 03/20/2015   Procedure: ROBOTIC ASSISTED  LAPAROSCOPIC Salpingectomy OVARIAN CYSTECTOMY, pelvic washings, excision of peritoneal pseudocyst.;  Surgeon: Delsa Bern, MD;  Location: [facility];  Service: Gynecology;  Laterality: Right;    Family History  Problem Relation Age of Onset   Asthma Mother    Arthritis Mother    Hypertension Father    Heart disease Maternal Grandmother        PCI   Cancer Maternal Grandmother 65       breast   Cancer Other 18       breast and possible colon   Migraines Sister    Diabetes Brother    Heart attack Paternal Grandfather        before age 59   Colon cancer Neg Hx    Colon polyps Neg Hx    Stomach cancer Neg Hx    Esophageal cancer Neg Hx     Social History   Tobacco Use   Smoking status: Never   Smokeless tobacco: Never  Vaping Use   Vaping Use: Never used  Substance Use Topics   Alcohol use: No    Alcohol/week: 0.0 standard drinks of alcohol   Drug use: No     Current Outpatient Medications:    acetaminophen (TYLENOL) 650 MG CR tablet, Take 650 mg by mouth every 8 (eight) hours as needed for pain., Disp: , Rfl:    ALPRAZolam (XANAX) 0.25 MG tablet, Take 1 tablet (0.25 mg total) by mouth 2 (two) times daily., Disp: 60 tablet, Rfl: 2   Ascorbic Acid (VITAMIN C) 1000 MG tablet, Take 1,000 mg  by mouth daily., Disp: , Rfl:    aspirin 325 MG tablet, Take 1 tablet (325 mg total) by mouth daily., Disp: , Rfl:    cetirizine (ZYRTEC) 10 MG tablet, Take 10 mg by mouth daily., Disp: , Rfl:    cholecalciferol (VITAMIN D) 1000 UNITS tablet, Take 1,000 Units by mouth daily., Disp: , Rfl:    fluticasone (FLONASE) 50 MCG/ACT nasal spray, Place 2 sprays into both nostrils daily., Disp: 15.8 mL, Rfl: 2   levalbuterol (XOPENEX HFA) 45 MCG/ACT inhaler, TAKE 2 PUFFS BY MOUTH EVERY 6 HOURS AS NEEDED FOR WHEEZE, Disp: 1 each, Rfl: 0   lisinopril (ZESTRIL) 10 MG tablet, Take 1 tablet (10 mg total) by mouth daily., Disp: 90 tablet, Rfl: 1   metoprolol tartrate (LOPRESSOR) 100 MG tablet, Take 1 tablet  (100 mg total) by mouth 2 (two) times daily., Disp: 180 tablet, Rfl: 1   Multiple Vitamins-Calcium (ONE-A-DAY WOMENS PO), Take 1 tablet by mouth daily., Disp: , Rfl:    NON FORMULARY, Adipolean twice a day for weight loss, Disp: , Rfl:    spironolactone (ALDACTONE) 25 MG tablet, Take 1 tablet (25 mg total) by mouth daily., Disp: 90 tablet, Rfl: 1   valACYclovir (VALTREX) 500 MG tablet, Take 1 tablet (500 mg total) by mouth daily., Disp: 90 tablet, Rfl: 1   vitamin B-12 (CYANOCOBALAMIN) 1000 MCG tablet, Take 1,000 mcg by mouth daily., Disp: , Rfl:   Allergies  Allergen Reactions   Dust Mite Extract    Mold Extract [Trichophyton]    Tomato     ROS: See pertinent positives and negatives per HPI.  OBSERVATIONS/OBJECTIVE:  VITALS per patient if applicable: Today's Vitals   08/06/22 1601  BP: 118/70  Weight: 217 lb (98.4 kg)   Body mass index is 39.69 kg/m.    GENERAL: Alert and oriented. Appears well and in no acute distress.  HEENT: Atraumatic. Conjunctiva clear. No obvious abnormalities on inspection of external nose and ears.  NECK: Normal movements of the head and neck.  LUNGS: On inspection, no signs of respiratory distress. Breathing rate appears normal. No obvious gross SOB, gasping or wheezing, and no conversational dyspnea.  CV: No obvious cyanosis.  MS: Moves all visible extremities without noticeable abnormality.  PSYCH/NEURO: Pleasant and cooperative. No obvious depression or anxiety. Speech and thought processing grossly intact.  ASSESSMENT AND PLAN:  Problem List Items Addressed This Visit       Cardiovascular and Mediastinum   Essential hypertension (Chronic)    Chronic, stable. BP has been running in the 110s-120s/70s at home. Continue lisinopril '10mg'$  daily, metoprolol '100mg'$  BID, and spironolactone '25mg'$  daily. Refill sent to the pharmacy. Follow-up in 6 months.       Relevant Medications   lisinopril (ZESTRIL) 10 MG tablet   metoprolol tartrate  (LOPRESSOR) 100 MG tablet   spironolactone (ALDACTONE) 25 MG tablet     I discussed the assessment and treatment plan with the patient. The patient was provided an opportunity to ask questions and all were answered. The patient agreed with the plan and demonstrated an understanding of the instructions.   The patient was advised to call back or seek an in-person evaluation if the symptoms worsen or if the condition fails to improve as anticipated.   Charyl Dancer, NP

## 2022-08-06 ENCOUNTER — Telehealth (INDEPENDENT_AMBULATORY_CARE_PROVIDER_SITE_OTHER): Payer: No Typology Code available for payment source | Admitting: Nurse Practitioner

## 2022-08-06 ENCOUNTER — Encounter: Payer: Self-pay | Admitting: Nurse Practitioner

## 2022-08-06 DIAGNOSIS — I10 Essential (primary) hypertension: Secondary | ICD-10-CM

## 2022-08-06 MED ORDER — METOPROLOL TARTRATE 100 MG PO TABS: 100.0000 mg | ORAL_TABLET | Freq: Two times a day (BID) | ORAL | 1 refills | Status: DC

## 2022-08-06 MED ORDER — SPIRONOLACTONE 25 MG PO TABS: 25.0000 mg | ORAL_TABLET | Freq: Every day | ORAL | 1 refills | Status: DC

## 2022-08-06 MED ORDER — FLUTICASONE PROPIONATE 50 MCG/ACT NA SUSP: 2.0000 | Freq: Every day | NASAL | 2 refills | Status: AC

## 2022-08-06 MED ORDER — VALACYCLOVIR HCL 500 MG PO TABS: 500.0000 mg | ORAL_TABLET | Freq: Every day | ORAL | 1 refills | Status: DC

## 2022-08-06 MED ORDER — LISINOPRIL 10 MG PO TABS: 10.0000 mg | ORAL_TABLET | Freq: Every day | ORAL | 1 refills | Status: DC

## 2022-08-06 NOTE — Patient Instructions (Signed)
It was great to see you!  I have sent the refills of your medication to your pharmacy.   Let's follow-up in 6 months, sooner if you have concerns.  If a referral was placed today, you will be contacted for an appointment. Please note that routine referrals can sometimes take up to 3-4 weeks to process. Please call our office if you haven't heard anything after this time frame.  Take care,  Vance Peper, NP

## 2022-08-06 NOTE — Assessment & Plan Note (Signed)
Chronic, stable. BP has been running in the 110s-120s/70s at home. Continue lisinopril '10mg'$  daily, metoprolol '100mg'$  BID, and spironolactone '25mg'$  daily. Refill sent to the pharmacy. Follow-up in 6 months.

## 2023-01-16 ENCOUNTER — Other Ambulatory Visit: Payer: Self-pay | Admitting: Nurse Practitioner

## 2023-01-16 DIAGNOSIS — I10 Essential (primary) hypertension: Secondary | ICD-10-CM

## 2023-01-30 ENCOUNTER — Other Ambulatory Visit: Payer: Self-pay | Admitting: Nurse Practitioner

## 2023-01-30 DIAGNOSIS — I10 Essential (primary) hypertension: Secondary | ICD-10-CM

## 2023-02-05 ENCOUNTER — Encounter: Payer: Self-pay | Admitting: Nurse Practitioner

## 2023-07-17 ENCOUNTER — Ambulatory Visit (INDEPENDENT_AMBULATORY_CARE_PROVIDER_SITE_OTHER): Payer: Medicaid Other | Admitting: Nurse Practitioner

## 2023-07-17 ENCOUNTER — Encounter: Payer: Self-pay | Admitting: Nurse Practitioner

## 2023-07-17 VITALS — BP 122/80 | HR 64 | Temp 97.0°F | Ht 63.0 in | Wt 208.4 lb

## 2023-07-17 DIAGNOSIS — Z0000 Encounter for general adult medical examination without abnormal findings: Secondary | ICD-10-CM

## 2023-07-17 DIAGNOSIS — E669 Obesity, unspecified: Secondary | ICD-10-CM | POA: Diagnosis not present

## 2023-07-17 DIAGNOSIS — G459 Transient cerebral ischemic attack, unspecified: Secondary | ICD-10-CM

## 2023-07-17 DIAGNOSIS — J4520 Mild intermittent asthma, uncomplicated: Secondary | ICD-10-CM | POA: Diagnosis not present

## 2023-07-17 DIAGNOSIS — Z114 Encounter for screening for human immunodeficiency virus [HIV]: Secondary | ICD-10-CM

## 2023-07-17 DIAGNOSIS — A6000 Herpesviral infection of urogenital system, unspecified: Secondary | ICD-10-CM | POA: Diagnosis not present

## 2023-07-17 DIAGNOSIS — I10 Essential (primary) hypertension: Secondary | ICD-10-CM | POA: Diagnosis not present

## 2023-07-17 LAB — CBC WITH DIFFERENTIAL/PLATELET
Basophils Absolute: 0 10*3/uL (ref 0.0–0.1)
Basophils Relative: 0.4 % (ref 0.0–3.0)
Eosinophils Absolute: 0.2 10*3/uL (ref 0.0–0.7)
Eosinophils Relative: 2.7 % (ref 0.0–5.0)
HCT: 40.2 % (ref 36.0–46.0)
Hemoglobin: 12.8 g/dL (ref 12.0–15.0)
Lymphocytes Relative: 42.9 % (ref 12.0–46.0)
Lymphs Abs: 3.4 10*3/uL (ref 0.7–4.0)
MCHC: 31.7 g/dL (ref 30.0–36.0)
MCV: 91.3 fl (ref 78.0–100.0)
Monocytes Absolute: 0.4 10*3/uL (ref 0.1–1.0)
Monocytes Relative: 5.6 % (ref 3.0–12.0)
Neutro Abs: 3.9 10*3/uL (ref 1.4–7.7)
Neutrophils Relative %: 48.4 % (ref 43.0–77.0)
Platelets: 351 10*3/uL (ref 150.0–400.0)
RBC: 4.4 Mil/uL (ref 3.87–5.11)
RDW: 13.6 % (ref 11.5–15.5)
WBC: 8 10*3/uL (ref 4.0–10.5)

## 2023-07-17 LAB — COMPREHENSIVE METABOLIC PANEL
ALT: 16 U/L (ref 0–35)
AST: 15 U/L (ref 0–37)
Albumin: 4 g/dL (ref 3.5–5.2)
Alkaline Phosphatase: 74 U/L (ref 39–117)
BUN: 11 mg/dL (ref 6–23)
CO2: 30 mEq/L (ref 19–32)
Calcium: 9.7 mg/dL (ref 8.4–10.5)
Chloride: 103 mEq/L (ref 96–112)
Creatinine, Ser: 0.94 mg/dL (ref 0.40–1.20)
GFR: 70.14 mL/min (ref >60.00)
Glucose, Bld: 86 mg/dL (ref 70–99)
Potassium: 4.1 mEq/L (ref 3.5–5.1)
Sodium: 141 mEq/L (ref 135–145)
Total Bilirubin: 0.5 mg/dL (ref 0.2–1.2)
Total Protein: 6.8 g/dL (ref 6.0–8.3)

## 2023-07-17 LAB — LIPID PANEL
Cholesterol: 149 mg/dL (ref 0–200)
HDL: 41.4 mg/dL (ref >39.00)
LDL Cholesterol: 81 mg/dL (ref 0–99)
NonHDL: 107.36
Total CHOL/HDL Ratio: 4
Triglycerides: 130 mg/dL (ref 0.0–149.0)
VLDL: 26 mg/dL (ref 0.0–40.0)

## 2023-07-17 MED ORDER — LISINOPRIL 10 MG PO TABS: 10.0000 mg | ORAL_TABLET | Freq: Every day | ORAL | 3 refills | Status: AC

## 2023-07-17 MED ORDER — METOPROLOL TARTRATE 100 MG PO TABS: 100.0000 mg | ORAL_TABLET | Freq: Two times a day (BID) | ORAL | 3 refills | Status: AC

## 2023-07-17 MED ORDER — SPIRONOLACTONE 25 MG PO TABS: 25.0000 mg | ORAL_TABLET | Freq: Every day | ORAL | 3 refills | Status: AC

## 2023-07-17 MED ORDER — LEVALBUTEROL TARTRATE 45 MCG/ACT IN AERO: INHALATION_SPRAY | RESPIRATORY_TRACT | 1 refills | Status: AC

## 2023-07-17 MED ORDER — VALACYCLOVIR HCL 500 MG PO TABS: 500.0000 mg | ORAL_TABLET | Freq: Every day | ORAL | 3 refills | Status: AC

## 2023-07-17 NOTE — Assessment & Plan Note (Signed)
Chronic, stable.  She uses albuterol inhaler as needed for symptoms.  She states that she rarely needs to use this.  Follow-up if symptoms worsen or with any concerns. ?

## 2023-07-17 NOTE — Assessment & Plan Note (Signed)
Chronic, stable. BP has been running in the 110s-120s/70s at home. Continue lisinopril '10mg'$  daily, metoprolol '100mg'$  BID, and spironolactone '25mg'$  daily. Refill sent to the pharmacy. Follow-up in 6 months.

## 2023-07-17 NOTE — Assessment & Plan Note (Signed)
Chronic, stable.  She takes Valtrex 500 mg daily for suppression.  She has not had any recent flares.  Refill sent to the pharmacy. ?

## 2023-07-17 NOTE — Progress Notes (Signed)
BP 122/80 (BP Location: Left Arm)   Pulse 64   Temp (!) 97 F (36.1 C)   Ht 5\' 3"  (1.6 m)   Wt 208 lb 6.4 oz (94.5 kg)   LMP 04/18/2012   SpO2 98%   BMI 36.92 kg/m    Subjective:    Patient ID: [name], female    DOB: 1971-11-02, 52 y.o.   MRN: [id number]  CC: Chief Complaint  Patient presents with   Annual Exam    With fasting lab work, RX refills    HPI: [name] is a 52 y.o. female presenting on 07/17/2023 for comprehensive medical examination. Current medical complaints include:none  She currently lives with: alone Menopausal Symptoms: no  Depression and Anxiety Screen done today and results listed below:     07/17/2023    9:16 AM 08/06/2022    4:04 PM 03/07/2021    4:49 PM 06/06/2020   11:40 AM 03/15/2018    2:34 PM  Depression screen PHQ 2/9  Decreased Interest 0 0 0 0 0  Down, Depressed, Hopeless 1 0 0 0 0  PHQ - 2 Score 1 0 0 0 0  Altered sleeping 1      Tired, decreased energy 0      Change in appetite 0      Feeling bad or failure about yourself  1      Trouble concentrating 0      Moving slowly or fidgety/restless 0      Suicidal thoughts 0      PHQ-9 Score 3      Difficult doing work/chores Not difficult at all          07/17/2023    9:16 AM  GAD 7 : Generalized Anxiety Score  Nervous, Anxious, on Edge 1  Control/stop worrying 0  Worry too much - different things 0  Trouble relaxing 0  Restless 0  Easily annoyed or irritable 0  Afraid - awful might happen 0  Total GAD 7 Score 1  Anxiety Difficulty Not difficult at all    The patient does not have a history of falls. I did not complete a risk assessment for falls. A plan of care for falls was not documented.   Past Medical History:  Past Medical History:  Diagnosis Date   Allergy    Anxiety 2018   Arthritis    Asthma    GERD (gastroesophageal reflux disease)    no meds   H/O: myomectomy    Hearing impaired    can hear on left side only   Heart murmur    Herpes  genitalia    Hypertension    pulmonary   Refusal of blood transfusions as patient is Jehovah's Witness    TIA (transient ischemic attack)     Surgical History:  Past Surgical History:  Procedure Laterality Date   BREAST BIOPSY Left 04/17/2021   BREAST EXCISIONAL BIOPSY  2012   COSMETIC SURGERY  10/08/2022   Liposuction   CYSTOSCOPY  05/19/2012   Procedure: CYSTOSCOPY;  Surgeon: Esmeralda Arthur, MD;  Location: [facility];  Service: Gynecology;  Laterality: N/A;   MYOMECTOMY  2004   2010 robotic   OVARIAN CYST REMOVAL  2010   x2   ROBOTIC ASSISTED LAP VAGINAL HYSTERECTOMY     ROBOTIC ASSISTED LAPAROSCOPIC OVARIAN CYSTECTOMY Right 03/20/2015   Procedure: ROBOTIC ASSISTED LAPAROSCOPIC Salpingectomy OVARIAN CYSTECTOMY, pelvic washings, excision of peritoneal pseudocyst.;  Surgeon: Silverio Lay, MD;  Location: [facility];  Service: Gynecology;  Laterality: Right;    Medications:  Current Outpatient Medications on File Prior to Visit  Medication Sig   acetaminophen (TYLENOL) 650 MG CR tablet Take 650 mg by mouth every 8 (eight) hours as needed for pain.   Ascorbic Acid (VITAMIN C) 1000 MG tablet Take 1,000 mg by mouth daily.   aspirin 325 MG tablet Take 1 tablet (325 mg total) by mouth daily.   cetirizine (ZYRTEC) 10 MG tablet Take 10 mg by mouth daily.   cholecalciferol (VITAMIN D) 1000 UNITS tablet Take 1,000 Units by mouth daily.   fluticasone (FLONASE) 50 MCG/ACT nasal spray Place 2 sprays into both nostrils daily.   Multiple Vitamins-Calcium (ONE-A-DAY WOMENS PO) Take 1 tablet by mouth daily.   No current facility-administered medications on file prior to visit.    Allergies:  Allergies  Allergen Reactions   Dust Mite Extract    Mold Extract [Trichophyton]    Tomato     Social History:  Social History   Socioeconomic History   Marital status: Single    Spouse name: Not on file   Number of children: Not on file   Years of education: Not on file   Highest education level:  Some college, no degree  Occupational History   Not on file  Tobacco Use   Smoking status: Never   Smokeless tobacco: Never  Vaping Use   Vaping status: Never Used  Substance and Sexual Activity   Alcohol use: No   Drug use: No   Sexual activity: Yes    Partners: Male    Birth control/protection: Surgical    Comment: Hysterectomy  Other Topics Concern   Not on file  Social History Narrative   Not on file   Social Determinants of Health   Financial Resource Strain: Medium Risk (07/17/2023)   Overall Financial Resource Strain (CARDIA)    Difficulty of Paying Living Expenses: Somewhat hard  Food Insecurity: Food Insecurity Present (07/17/2023)   Hunger Vital Sign    Worried About Running Out of Food in the Last Year: Sometimes true    Ran Out of Food in the Last Year: Sometimes true  Transportation Needs: No Transportation Needs (07/17/2023)   PRAPARE - Administrator, Civil Service (Medical): No    Lack of Transportation (Non-Medical): No  Physical Activity: Sufficiently Active (07/17/2023)   Exercise Vital Sign    Days of Exercise per Week: 3 days    Minutes of Exercise per Session: 90 min  Stress: Stress Concern Present (07/17/2023)   [employer] of Occupational Health - Occupational Stress Questionnaire    Feeling of Stress : Very much  Social Connections: Unknown (07/17/2023)   Social Connection and Isolation Panel [NHANES]    Frequency of Communication with Friends and Family: More than three times a week    Frequency of Social Gatherings with Friends and Family: Three times a week    Attends Religious Services: More than 4 times per year    Active Member of Clubs or Organizations: Not on file    Attends Banker Meetings: Not on file    Marital Status: Never married  Intimate Partner Violence: Not on file   Social History   Tobacco Use  Smoking Status Never  Smokeless Tobacco Never   Social History   Substance and Sexual Activity   Alcohol Use No    Family History:  Family History  Problem Relation Age of Onset   Asthma Mother    Arthritis  Mother    Diabetes Mother    Hypertension Father    COPD Father    Heart disease Maternal Grandmother        PCI   Cancer Maternal Grandmother 44       breast   Cancer Other 60       breast and possible colon   Migraines Sister    Diabetes Brother    Heart attack Paternal Grandfather        before age 26   Asthma Son    Colon cancer Neg Hx    Colon polyps Neg Hx    Stomach cancer Neg Hx    Esophageal cancer Neg Hx     Past medical history, surgical history, medications, allergies, family history and social history reviewed with patient today and changes made to appropriate areas of the chart.   Review of Systems  Constitutional: Negative.   HENT: Negative.    Eyes: Negative.   Respiratory: Negative.    Cardiovascular: Negative.   Gastrointestinal: Negative.   Genitourinary: Negative.   Musculoskeletal: Negative.   Skin: Negative.   Neurological: Negative.   Psychiatric/Behavioral: Negative.     All other ROS negative except what is listed above and in the HPI.      Objective:    BP 122/80 (BP Location: Left Arm)   Pulse 64   Temp (!) 97 F (36.1 C)   Ht 5\' 3"  (1.6 m)   Wt 208 lb 6.4 oz (94.5 kg)   LMP 04/18/2012   SpO2 98%   BMI 36.92 kg/m   Wt Readings from Last 3 Encounters:  07/17/23 208 lb 6.4 oz (94.5 kg)  08/06/22 217 lb (98.4 kg)  01/23/22 217 lb 3.2 oz (98.5 kg)    Physical Exam Vitals and nursing note reviewed.  Constitutional:      General: She is not in acute distress.    Appearance: Normal appearance.  HENT:     Head: Normocephalic and atraumatic.     Right Ear: Tympanic membrane, ear canal and external ear normal.     Left Ear: Tympanic membrane, ear canal and external ear normal.  Eyes:     Conjunctiva/sclera: Conjunctivae normal.  Cardiovascular:     Rate and Rhythm: Normal rate and regular rhythm.     Pulses: Normal  pulses.     Heart sounds: Normal heart sounds.  Pulmonary:     Effort: Pulmonary effort is normal.     Breath sounds: Normal breath sounds.  Abdominal:     Palpations: Abdomen is soft.     Tenderness: There is no abdominal tenderness.  Musculoskeletal:        General: Normal range of motion.     Cervical back: Normal range of motion and neck supple.     Right lower leg: No edema.     Left lower leg: No edema.  Lymphadenopathy:     Cervical: No cervical adenopathy.  Skin:    General: Skin is warm and dry.  Neurological:     General: No focal deficit present.     Mental Status: She is alert and oriented to person, place, and time.     Cranial Nerves: No cranial nerve deficit.     Coordination: Coordination normal.     Gait: Gait normal.  Psychiatric:        Mood and Affect: Mood normal.        Behavior: Behavior normal.        Thought Content: Thought content normal.  Judgment: Judgment normal.     Results for orders placed or performed in visit on 01/23/22  CBC  Result Value Ref Range   WBC 8.8 4.0 - 10.5 K/uL   RBC 4.17 3.87 - 5.11 Mil/uL   Platelets 347.0 150.0 - 400.0 K/uL   Hemoglobin 12.4 12.0 - 15.0 g/dL   HCT 16.1 09.6 - 04.5 %   MCV 91.7 78.0 - 100.0 fl   MCHC 32.5 30.0 - 36.0 g/dL   RDW 40.9 81.1 - 91.4 %  Comprehensive metabolic panel  Result Value Ref Range   Sodium 139 135 - 145 mEq/L   Potassium 4.3 3.5 - 5.1 mEq/L   Chloride 103 96 - 112 mEq/L   CO2 28 19 - 32 mEq/L   Glucose, Bld 81 70 - 99 mg/dL   BUN 14 6 - 23 mg/dL   Creatinine, Ser 7.82 0.40 - 1.20 mg/dL   Total Bilirubin 0.3 0.2 - 1.2 mg/dL   Alkaline Phosphatase 68 39 - 117 U/L   AST 19 0 - 37 U/L   ALT 16 0 - 35 U/L   Total Protein 7.4 6.0 - 8.3 g/dL   Albumin 4.2 3.5 - 5.2 g/dL   GFR 95.62 >13.08 mL/min   Calcium 9.7 8.4 - 10.5 mg/dL  Lipid panel  Result Value Ref Range   Cholesterol 147 0 - 200 mg/dL   Triglycerides 657.8 0.0 - 149.0 mg/dL   HDL 46.96 >29.52 mg/dL   VLDL  84.1 0.0 - 32.4 mg/dL   LDL Cholesterol 80 0 - 99 mg/dL   Total CHOL/HDL Ratio 3    NonHDL 101.81       Assessment & Plan:   Problem List Items Addressed This Visit       Cardiovascular and Mediastinum   Essential hypertension (Chronic)    Chronic, stable. BP has been running in the 110s-120s/70s at home. Continue lisinopril 10mg  daily, metoprolol 100mg  BID, and spironolactone 25mg  daily. Refill sent to the pharmacy. Follow-up in 6 months.       Relevant Medications   lisinopril (ZESTRIL) 10 MG tablet   metoprolol tartrate (LOPRESSOR) 100 MG tablet   spironolactone (ALDACTONE) 25 MG tablet   Other Relevant Orders   CBC with Differential/Platelet   Comprehensive metabolic panel   Lipid panel   TIA (transient ischemic attack)    History of TIA, symptoms have resolved.  Since then she has been taking aspirin 325 mg daily.  We will continue this.  Check lipid panel today.      Relevant Medications   lisinopril (ZESTRIL) 10 MG tablet   metoprolol tartrate (LOPRESSOR) 100 MG tablet   spironolactone (ALDACTONE) 25 MG tablet     Respiratory   Mild intermittent asthma    Chronic, stable.  She uses albuterol inhaler as needed for symptoms.  She states that she rarely needs to use this.  Follow-up if symptoms worsen or with any concerns.      Relevant Medications   levalbuterol (XOPENEX HFA) 45 MCG/ACT inhaler     Genitourinary   Herpes genitalia    Chronic, stable.  She takes Valtrex 500 mg daily for suppression.  She has not had any recent flares.  Refill sent to the pharmacy.      Relevant Medications   valACYclovir (VALTREX) 500 MG tablet     Other   Obesity (BMI 30-39.9)    BMI 36.7.  Discussed diet and exercise. She has lost 9 pounds since her last visit.  Routine general medical examination at a health care facility - Primary    Health maintenance reviewed and updated. Discussed nutrition, exercise. Check CMP, CBC today. Follow-up 1 year.        Other  Visit Diagnoses     Screening for HIV (human immunodeficiency virus)       Screen HIV today   Relevant Orders   HIV Antibody (routine testing w rflx)        Follow up plan: Return in about 1 year (around 07/16/2024) for CPE.   LABORATORY TESTING:  - Pap smear: done elsewhere  IMMUNIZATIONS:   - Tdap: Tetanus vaccination status reviewed: last tetanus booster within 10 years. - Influenza: Postponed to flu season - Pneumovax: Not applicable - Prevnar: Not applicable - HPV: Not applicable - Shingrix vaccine: Not applicable  SCREENING: -Mammogram: Done elsewhere  - Colonoscopy: Up to date  - Bone Density: Not applicable   PATIENT COUNSELING:   Advised to take 1 mg of folate supplement per day if capable of pregnancy.   Sexuality: Discussed sexually transmitted diseases, partner selection, use of condoms, avoidance of unintended pregnancy  and contraceptive alternatives.   Advised to avoid cigarette smoking.  I discussed with the patient that most people either abstain from alcohol or drink within safe limits (<=14/week and <=4 drinks/occasion for males, <=7/weeks and <= 3 drinks/occasion for females) and that the risk for alcohol disorders and other health effects rises proportionally with the number of drinks per week and how often a drinker exceeds daily limits.  Discussed cessation/primary prevention of drug use and availability of treatment for abuse.   Diet: Encouraged to adjust caloric intake to maintain  or achieve ideal body weight, to reduce intake of dietary saturated fat and total fat, to limit sodium intake by avoiding high sodium foods and not adding table salt, and to maintain adequate dietary potassium and calcium preferably from fresh fruits, vegetables, and low-fat dairy products.    stressed the importance of regular exercise  Injury prevention: Discussed safety belts, safety helmets, smoke detector, smoking near bedding or upholstery.   Dental health:  Discussed importance of regular tooth brushing, flossing, and dental visits.    NEXT PREVENTATIVE PHYSICAL DUE IN 1 YEAR. Return in about 1 year (around 07/16/2024) for CPE.  Kemya Shed A Chrissy Ealey

## 2023-07-17 NOTE — Patient Instructions (Signed)
It was great to see you!  We are checking your labs today and will let you know the results via mychart/phone.   I have refilled your medications.   Let's follow-up in 1 year, sooner if you have concerns.  If a referral was placed today, you will be contacted for an appointment. Please note that routine referrals can sometimes take up to 3-4 weeks to process. Please call our office if you haven't heard anything after this time frame.  Take care,  Rodman Pickle, NP

## 2023-07-17 NOTE — Assessment & Plan Note (Signed)
Health maintenance reviewed and updated. Discussed nutrition, exercise. Check CMP, CBC today. Follow-up 1 year.   

## 2023-07-17 NOTE — Assessment & Plan Note (Signed)
History of TIA, symptoms have resolved.  Since then she has been taking aspirin 325 mg daily.  We will continue this.  Check lipid panel today.

## 2023-07-17 NOTE — Assessment & Plan Note (Signed)
BMI 36.7.  Discussed diet and exercise. She has lost 9 pounds since her last visit.

## 2023-07-18 LAB — HIV ANTIBODY (ROUTINE TESTING W REFLEX): HIV 1&2 Ab, 4th Generation: NONREACTIVE

## 2024-01-22 ENCOUNTER — Other Ambulatory Visit: Payer: Self-pay | Admitting: Obstetrics and Gynecology

## 2024-01-22 DIAGNOSIS — Z1231 Encounter for screening mammogram for malignant neoplasm of breast: Secondary | ICD-10-CM
